# Patient Record
Sex: Female | Born: 1965 | Hispanic: No | Marital: Married | Smoking: Never smoker
Health system: Southern US, Community
[De-identification: ages and names within clinical notes are randomized; demographics above are authoritative.]

## PROBLEM LIST (undated history)

## (undated) DIAGNOSIS — Z923 Personal history of irradiation: Secondary | ICD-10-CM

## (undated) DIAGNOSIS — I10 Essential (primary) hypertension: Secondary | ICD-10-CM

## (undated) DIAGNOSIS — C50919 Malignant neoplasm of unspecified site of unspecified female breast: Secondary | ICD-10-CM

## (undated) DIAGNOSIS — K219 Gastro-esophageal reflux disease without esophagitis: Secondary | ICD-10-CM

## (undated) DIAGNOSIS — F419 Anxiety disorder, unspecified: Secondary | ICD-10-CM

## (undated) HISTORY — DX: Malignant neoplasm of unspecified site of unspecified female breast: C50.919

## (undated) HISTORY — PX: CHOLECYSTECTOMY: SHX55

---

## 2007-05-23 ENCOUNTER — Encounter: Admission: RE | Admit: 2007-05-23 | Discharge: 2007-05-23 | Payer: Self-pay | Admitting: General Surgery

## 2007-05-27 ENCOUNTER — Ambulatory Visit (HOSPITAL_COMMUNITY): Admission: RE | Admit: 2007-05-27 | Discharge: 2007-05-27 | Payer: Self-pay | Admitting: Internal Medicine

## 2007-08-05 ENCOUNTER — Encounter: Admission: RE | Admit: 2007-08-05 | Discharge: 2007-08-05 | Payer: Self-pay | Admitting: Family Medicine

## 2007-09-23 ENCOUNTER — Encounter: Admission: RE | Admit: 2007-09-23 | Discharge: 2007-09-23 | Payer: Self-pay | Admitting: Internal Medicine

## 2007-09-23 ENCOUNTER — Encounter (INDEPENDENT_AMBULATORY_CARE_PROVIDER_SITE_OTHER): Payer: Self-pay | Admitting: Diagnostic Radiology

## 2007-09-28 HISTORY — PX: MASTECTOMY: SHX3

## 2007-12-01 ENCOUNTER — Ambulatory Visit: Admission: RE | Admit: 2007-12-01 | Discharge: 2008-02-14 | Payer: Self-pay | Admitting: Radiation Oncology

## 2008-05-17 ENCOUNTER — Encounter: Admission: RE | Admit: 2008-05-17 | Discharge: 2008-05-17 | Payer: Self-pay | Admitting: Internal Medicine

## 2008-06-26 ENCOUNTER — Encounter: Admission: RE | Admit: 2008-06-26 | Discharge: 2008-06-26 | Payer: Self-pay | Admitting: Internal Medicine

## 2009-06-26 ENCOUNTER — Encounter: Admission: RE | Admit: 2009-06-26 | Discharge: 2009-06-26 | Payer: Self-pay | Admitting: Internal Medicine

## 2010-06-27 ENCOUNTER — Encounter
Admission: RE | Admit: 2010-06-27 | Discharge: 2010-06-27 | Payer: Self-pay | Source: Home / Self Care | Attending: Internal Medicine | Admitting: Internal Medicine

## 2010-07-20 ENCOUNTER — Encounter: Payer: Self-pay | Admitting: General Surgery

## 2010-07-21 ENCOUNTER — Encounter
Admission: RE | Admit: 2010-07-21 | Discharge: 2010-07-21 | Payer: Self-pay | Source: Home / Self Care | Attending: Internal Medicine | Admitting: Internal Medicine

## 2011-02-24 ENCOUNTER — Other Ambulatory Visit (HOSPITAL_COMMUNITY): Payer: Self-pay | Admitting: Internal Medicine

## 2011-02-24 DIAGNOSIS — Z1231 Encounter for screening mammogram for malignant neoplasm of breast: Secondary | ICD-10-CM

## 2011-02-24 DIAGNOSIS — Z9012 Acquired absence of left breast and nipple: Secondary | ICD-10-CM

## 2011-07-02 ENCOUNTER — Ambulatory Visit
Admission: RE | Admit: 2011-07-02 | Discharge: 2011-07-02 | Disposition: A | Discharge status: Home / Self Care | Payer: BC Managed Care – PPO | Source: Ambulatory Visit | Attending: Internal Medicine | Admitting: Internal Medicine

## 2011-07-02 DIAGNOSIS — Z9012 Acquired absence of left breast and nipple: Secondary | ICD-10-CM

## 2011-07-02 DIAGNOSIS — Z1231 Encounter for screening mammogram for malignant neoplasm of breast: Secondary | ICD-10-CM

## 2012-02-11 ENCOUNTER — Encounter: Payer: BC Managed Care – PPO | Admitting: Internal Medicine

## 2012-02-26 ENCOUNTER — Encounter: Payer: BC Managed Care – PPO | Admitting: Internal Medicine

## 2012-02-26 DIAGNOSIS — Z170 Estrogen receptor positive status [ER+]: Secondary | ICD-10-CM

## 2012-02-26 DIAGNOSIS — C50919 Malignant neoplasm of unspecified site of unspecified female breast: Secondary | ICD-10-CM

## 2012-06-17 ENCOUNTER — Other Ambulatory Visit (HOSPITAL_COMMUNITY): Payer: Self-pay | Admitting: Internal Medicine

## 2012-06-17 DIAGNOSIS — Z1231 Encounter for screening mammogram for malignant neoplasm of breast: Secondary | ICD-10-CM

## 2012-06-17 DIAGNOSIS — Z9012 Acquired absence of left breast and nipple: Secondary | ICD-10-CM

## 2012-06-17 DIAGNOSIS — Z853 Personal history of malignant neoplasm of breast: Secondary | ICD-10-CM

## 2012-07-20 ENCOUNTER — Ambulatory Visit
Admission: RE | Admit: 2012-07-20 | Discharge: 2012-07-20 | Disposition: A | Discharge status: Home / Self Care | Payer: BC Managed Care – PPO | Source: Ambulatory Visit | Attending: Internal Medicine | Admitting: Internal Medicine

## 2012-07-20 DIAGNOSIS — Z9012 Acquired absence of left breast and nipple: Secondary | ICD-10-CM

## 2012-07-20 DIAGNOSIS — Z853 Personal history of malignant neoplasm of breast: Secondary | ICD-10-CM

## 2012-07-20 DIAGNOSIS — Z1231 Encounter for screening mammogram for malignant neoplasm of breast: Secondary | ICD-10-CM

## 2012-07-29 ENCOUNTER — Other Ambulatory Visit (HOSPITAL_COMMUNITY): Payer: Self-pay | Admitting: Internal Medicine

## 2012-07-29 DIAGNOSIS — Z853 Personal history of malignant neoplasm of breast: Secondary | ICD-10-CM

## 2012-08-16 ENCOUNTER — Ambulatory Visit
Admission: RE | Admit: 2012-08-16 | Discharge: 2012-08-16 | Disposition: A | Discharge status: Home / Self Care | Payer: BC Managed Care – PPO | Source: Ambulatory Visit | Attending: Internal Medicine | Admitting: Internal Medicine

## 2012-08-16 DIAGNOSIS — Z853 Personal history of malignant neoplasm of breast: Secondary | ICD-10-CM

## 2012-08-16 MED ORDER — GADOBENATE DIMEGLUMINE 529 MG/ML IV SOLN
14.0000 mL | Freq: Once | INTRAVENOUS | Status: AC | PRN
  Administered 2012-08-16: 14 mL via INTRAVENOUS

## 2012-12-22 ENCOUNTER — Encounter: Payer: BC Managed Care – PPO | Admitting: Internal Medicine

## 2013-07-05 ENCOUNTER — Other Ambulatory Visit: Payer: Self-pay

## 2013-07-05 DIAGNOSIS — Z1231 Encounter for screening mammogram for malignant neoplasm of breast: Secondary | ICD-10-CM

## 2013-07-05 DIAGNOSIS — Z853 Personal history of malignant neoplasm of breast: Secondary | ICD-10-CM

## 2013-07-05 DIAGNOSIS — Z9012 Acquired absence of left breast and nipple: Secondary | ICD-10-CM

## 2013-07-20 ENCOUNTER — Other Ambulatory Visit: Payer: Self-pay

## 2013-07-20 DIAGNOSIS — Z9012 Acquired absence of left breast and nipple: Secondary | ICD-10-CM

## 2013-07-20 DIAGNOSIS — Z1231 Encounter for screening mammogram for malignant neoplasm of breast: Secondary | ICD-10-CM

## 2013-07-20 DIAGNOSIS — Z853 Personal history of malignant neoplasm of breast: Secondary | ICD-10-CM

## 2013-07-28 ENCOUNTER — Ambulatory Visit
Admission: RE | Admit: 2013-07-28 | Discharge: 2013-07-28 | Disposition: A | Discharge status: Home / Self Care | Payer: BC Managed Care – PPO | Source: Ambulatory Visit

## 2013-07-28 DIAGNOSIS — Z1231 Encounter for screening mammogram for malignant neoplasm of breast: Secondary | ICD-10-CM

## 2013-07-28 DIAGNOSIS — Z9012 Acquired absence of left breast and nipple: Secondary | ICD-10-CM

## 2013-07-28 DIAGNOSIS — Z853 Personal history of malignant neoplasm of breast: Secondary | ICD-10-CM

## 2013-09-18 ENCOUNTER — Other Ambulatory Visit (HOSPITAL_COMMUNITY): Payer: Self-pay | Admitting: Internal Medicine

## 2013-09-18 DIAGNOSIS — C50919 Malignant neoplasm of unspecified site of unspecified female breast: Secondary | ICD-10-CM

## 2013-09-18 DIAGNOSIS — Z1231 Encounter for screening mammogram for malignant neoplasm of breast: Secondary | ICD-10-CM

## 2014-07-24 ENCOUNTER — Other Ambulatory Visit: Payer: BC Managed Care – PPO

## 2014-07-30 ENCOUNTER — Ambulatory Visit: Payer: Self-pay

## 2014-07-30 ENCOUNTER — Ambulatory Visit: Payer: BC Managed Care – PPO

## 2014-07-31 ENCOUNTER — Ambulatory Visit
Admission: RE | Admit: 2014-07-31 | Discharge: 2014-07-31 | Disposition: A | Discharge status: Home / Self Care | Payer: BLUE CROSS/BLUE SHIELD | Source: Ambulatory Visit | Attending: Internal Medicine | Admitting: Internal Medicine

## 2014-07-31 DIAGNOSIS — Z1231 Encounter for screening mammogram for malignant neoplasm of breast: Secondary | ICD-10-CM

## 2014-08-01 ENCOUNTER — Other Ambulatory Visit: Payer: BC Managed Care – PPO

## 2014-09-17 ENCOUNTER — Other Ambulatory Visit (HOSPITAL_COMMUNITY): Payer: Self-pay | Admitting: Internal Medicine

## 2014-09-17 DIAGNOSIS — C50112 Malignant neoplasm of central portion of left female breast: Secondary | ICD-10-CM

## 2014-09-17 DIAGNOSIS — M898X9 Other specified disorders of bone, unspecified site: Secondary | ICD-10-CM

## 2014-09-20 ENCOUNTER — Other Ambulatory Visit (HOSPITAL_COMMUNITY): Payer: Self-pay | Admitting: Internal Medicine

## 2014-09-20 ENCOUNTER — Encounter (HOSPITAL_COMMUNITY): Payer: BLUE CROSS/BLUE SHIELD

## 2014-09-20 ENCOUNTER — Encounter (HOSPITAL_COMMUNITY)
Admission: RE | Admit: 2014-09-20 | Discharge: 2014-09-20 | Disposition: A | Discharge status: Home / Self Care | Payer: BLUE CROSS/BLUE SHIELD | Source: Ambulatory Visit | Attending: Internal Medicine | Admitting: Internal Medicine

## 2014-09-20 DIAGNOSIS — C50112 Malignant neoplasm of central portion of left female breast: Secondary | ICD-10-CM

## 2014-09-20 DIAGNOSIS — R079 Chest pain, unspecified: Secondary | ICD-10-CM | POA: Diagnosis not present

## 2014-09-20 DIAGNOSIS — M549 Dorsalgia, unspecified: Secondary | ICD-10-CM | POA: Diagnosis not present

## 2014-09-20 DIAGNOSIS — C50919 Malignant neoplasm of unspecified site of unspecified female breast: Secondary | ICD-10-CM

## 2014-09-20 DIAGNOSIS — M898X9 Other specified disorders of bone, unspecified site: Secondary | ICD-10-CM

## 2014-09-20 MED ORDER — TECHNETIUM TC 99M MEDRONATE IV KIT
25.0000 | PACK | Freq: Once | INTRAVENOUS | Status: AC | PRN
  Administered 2014-09-20: 25 via INTRAVENOUS

## 2014-09-25 ENCOUNTER — Other Ambulatory Visit (HOSPITAL_COMMUNITY): Payer: Self-pay | Admitting: Internal Medicine

## 2014-09-25 ENCOUNTER — Ambulatory Visit (HOSPITAL_COMMUNITY)
Admission: RE | Admit: 2014-09-25 | Discharge: 2014-09-25 | Disposition: A | Discharge status: Home / Self Care | Payer: BLUE CROSS/BLUE SHIELD | Source: Ambulatory Visit | Attending: Internal Medicine | Admitting: Internal Medicine

## 2014-09-25 DIAGNOSIS — C7802 Secondary malignant neoplasm of left lung: Secondary | ICD-10-CM | POA: Diagnosis not present

## 2014-09-25 DIAGNOSIS — C7951 Secondary malignant neoplasm of bone: Secondary | ICD-10-CM | POA: Insufficient documentation

## 2014-09-25 DIAGNOSIS — G893 Neoplasm related pain (acute) (chronic): Secondary | ICD-10-CM

## 2014-09-25 DIAGNOSIS — C50919 Malignant neoplasm of unspecified site of unspecified female breast: Secondary | ICD-10-CM | POA: Diagnosis present

## 2014-09-25 DIAGNOSIS — C771 Secondary and unspecified malignant neoplasm of intrathoracic lymph nodes: Secondary | ICD-10-CM | POA: Insufficient documentation

## 2014-09-25 DIAGNOSIS — C7801 Secondary malignant neoplasm of right lung: Secondary | ICD-10-CM | POA: Diagnosis not present

## 2014-09-25 DIAGNOSIS — C419 Malignant neoplasm of bone and articular cartilage, unspecified: Secondary | ICD-10-CM

## 2014-09-25 DIAGNOSIS — M5440 Lumbago with sciatica, unspecified side: Secondary | ICD-10-CM

## 2014-09-25 LAB — GLUCOSE, CAPILLARY: Glucose-Capillary: 93 mg/dL (ref 70–99)

## 2014-09-25 MED ORDER — FLUDEOXYGLUCOSE F - 18 (FDG) INJECTION
7.8900 | Freq: Once | INTRAVENOUS | Status: AC | PRN
  Administered 2014-09-25: 7.89 via INTRAVENOUS

## 2014-09-26 ENCOUNTER — Other Ambulatory Visit (HOSPITAL_COMMUNITY): Payer: Self-pay | Admitting: Internal Medicine

## 2014-09-26 DIAGNOSIS — C50912 Malignant neoplasm of unspecified site of left female breast: Secondary | ICD-10-CM

## 2014-09-27 ENCOUNTER — Other Ambulatory Visit: Payer: Self-pay | Admitting: Radiology

## 2014-10-01 ENCOUNTER — Ambulatory Visit (HOSPITAL_COMMUNITY)
Admission: RE | Admit: 2014-10-01 | Discharge: 2014-10-01 | Disposition: A | Discharge status: Home / Self Care | Payer: BLUE CROSS/BLUE SHIELD | Source: Ambulatory Visit | Attending: Internal Medicine | Admitting: Internal Medicine

## 2014-10-01 ENCOUNTER — Encounter (HOSPITAL_COMMUNITY): Payer: Self-pay

## 2014-10-01 DIAGNOSIS — C50912 Malignant neoplasm of unspecified site of left female breast: Secondary | ICD-10-CM

## 2014-10-01 DIAGNOSIS — M899 Disorder of bone, unspecified: Secondary | ICD-10-CM | POA: Diagnosis present

## 2014-10-01 HISTORY — DX: Anxiety disorder, unspecified: F41.9

## 2014-10-01 LAB — CBC WITH DIFFERENTIAL/PLATELET
BASOS ABS: 0 10*3/uL (ref 0.0–0.1)
Basophils Relative: 0 % (ref 0–1)
EOS PCT: 3 % (ref 0–5)
Eosinophils Absolute: 0.2 10*3/uL (ref 0.0–0.7)
HEMATOCRIT: 43.2 % (ref 36.0–46.0)
HEMOGLOBIN: 14.2 g/dL (ref 12.0–15.0)
Lymphocytes Relative: 29 % (ref 12–46)
Lymphs Abs: 2.6 10*3/uL (ref 0.7–4.0)
MCH: 30.7 pg (ref 26.0–34.0)
MCHC: 32.9 g/dL (ref 30.0–36.0)
MCV: 93.3 fL (ref 78.0–100.0)
Monocytes Absolute: 0.8 10*3/uL (ref 0.1–1.0)
Monocytes Relative: 8 % (ref 3–12)
NEUTROS ABS: 5.4 10*3/uL (ref 1.7–7.7)
Neutrophils Relative %: 60 % (ref 43–77)
Platelets: 239 10*3/uL (ref 150–400)
RBC: 4.63 MIL/uL (ref 3.87–5.11)
RDW: 12.9 % (ref 11.5–15.5)
WBC: 9 10*3/uL (ref 4.0–10.5)

## 2014-10-01 LAB — APTT: aPTT: 28 seconds (ref 24–37)

## 2014-10-01 LAB — PROTIME-INR
INR: 0.93 (ref 0.00–1.49)
Prothrombin Time: 12.5 seconds (ref 11.6–15.2)

## 2014-10-01 MED ORDER — MIDAZOLAM HCL 2 MG/2ML IJ SOLN
INTRAMUSCULAR | Status: AC | PRN
  Administered 2014-10-01 (×3): 1 mg via INTRAVENOUS

## 2014-10-01 MED ORDER — FENTANYL CITRATE 0.05 MG/ML IJ SOLN
INTRAMUSCULAR | Status: AC | PRN
Start: 2014-10-01 — End: 2014-10-01
  Administered 2014-10-01: 25 ug via INTRAVENOUS
  Administered 2014-10-01: 50 ug via INTRAVENOUS

## 2014-10-01 MED ORDER — SODIUM CHLORIDE 0.9 % IV SOLN
INTRAVENOUS | Status: DC
  Administered 2014-10-01: 07:00:00 via INTRAVENOUS

## 2014-10-01 MED ORDER — FENTANYL CITRATE 0.05 MG/ML IJ SOLN
INTRAMUSCULAR | Status: AC
  Filled 2014-10-01: qty 4

## 2014-10-01 MED ORDER — MIDAZOLAM HCL 2 MG/2ML IJ SOLN
INTRAMUSCULAR | Status: AC
  Filled 2014-10-01: qty 6

## 2014-10-01 NOTE — Discharge Instructions (Signed)
Leave dressing on for 24 hours.  You may shower after 24 hours.  Please remove the dressing before you shower.   ° °Conscious Sedation, Adult, Care After °Refer to this sheet in the next few weeks. These instructions provide you with information on caring for yourself after your procedure. Your health care provider may also give you more specific instructions. Your treatment has been planned according to current medical practices, but problems sometimes occur. Call your health care provider if you have any problems or questions after your procedure. °WHAT TO EXPECT AFTER THE PROCEDURE  °After your procedure: °· You may feel sleepy, clumsy, and have poor balance for several hours. °· Vomiting may occur if you eat too soon after the procedure. °HOME CARE INSTRUCTIONS °· Do not participate in any activities where you could become injured for at least 24 hours. Do not: °¨ Drive. °¨ Swim. °¨ Ride a bicycle. °¨ Operate heavy machinery. °¨ Cook. °¨ Use power tools. °¨ Climb ladders. °¨ Work from a high place. °· Do not make important decisions or sign legal documents until you are improved. °· If you vomit, drink water, juice, or soup when you can drink without vomiting. Make sure you have little or no nausea before eating solid foods. °· Only take over-the-counter or prescription medicines for pain, discomfort, or fever as directed by your health care provider. °· Make sure you and your family fully understand everything about the medicines given to you, including what side effects may occur. °· You should not drink alcohol, take sleeping pills, or take medicines that cause drowsiness for at least 24 hours. °· If you smoke, do not smoke without supervision. °· If you are feeling better, you may resume normal activities 24 hours after you were sedated. °· Keep all appointments with your health care provider. °SEEK MEDICAL CARE IF: °· Your skin is pale or bluish in color. °· You continue to feel nauseous or vomit. °· Your  pain is getting worse and is not helped by medicine. °· You have bleeding or swelling. °· You are still sleepy or feeling clumsy after 24 hours. °SEEK IMMEDIATE MEDICAL CARE IF: °· You develop a rash. °· You have difficulty breathing. °· You develop any type of allergic problem. °· You have a fever. °MAKE SURE YOU: °· Understand these instructions. °· Will watch your condition. °· Will get help right away if you are not doing well or get worse. °Document Released: 04/05/2013 Document Reviewed: 04/05/2013 °[employer]® Patient Information ©2015 [employer]. This information is not intended to replace advice given to you by your health care provider. Make sure you discuss any questions you have with your health care provider. °Bone Marrow Aspiration, Bone Marrow Biopsy °Care After °Read the instructions outlined below and refer to this sheet in the next few weeks. These discharge instructions provide you with general information on caring for yourself after you leave the hospital. Your caregiver may also give you specific instructions. While your treatment has been planned according to the most current medical practices available, unavoidable complications occasionally occur. If you have any problems or questions after discharge, call your caregiver. °FINDING OUT THE RESULTS OF YOUR TEST °Not all test results are available during your visit. If your test results are not back during the visit, make an appointment with your caregiver to find out the results. Do not assume everything is normal if you have not heard from your caregiver or the medical facility. It is important for you to follow up   on all of your test results.  °HOME CARE INSTRUCTIONS  °You have had sedation and may be sleepy or dizzy. Your thinking may not be as clear as usual. For the next 24 hours: °· Only take over-the-counter or prescription medicines for pain, discomfort, and or fever as directed by your caregiver. °· Do not drink alcohol. °· Do not  smoke. °· Do not drive. °· Do not make important legal decisions. °· Do not operate heavy machinery. °· Do not care for small children by yourself. °· Keep your dressing clean and dry. You may replace dressing with a bandage after 24 hours. °· You may take a bath or shower after 24 hours. °· Use an ice pack for 20 minutes every 2 hours while awake for pain as needed. °SEEK MEDICAL CARE IF:  °· There is redness, swelling, or increasing pain at the biopsy site. °· There is pus coming from the biopsy site. °· There is drainage from a biopsy site lasting longer than one day. °· An unexplained oral temperature above 102° F (38.9° C) develops. °SEEK IMMEDIATE MEDICAL CARE IF:  °· You develop a rash. °· You have difficulty breathing. °· You develop any reaction or side effects to medications given. °Document Released: 01/02/2005 Document Revised: 09/07/2011 Document Reviewed: 06/12/2008 °[employer]® Patient Information ©2015 [employer]. This information is not intended to replace advice given to you by your health care provider. Make sure you discuss any questions you have with your health care provider. ° ° °

## 2014-10-01 NOTE — Procedures (Signed)
L iliac bone Bx 18 g core times four No comp

## 2014-10-01 NOTE — H&P (Signed)
Chief Complaint: "I'm having a biopsy"  Referring Physician(s): Darovsky,Boris M  History of Present Illness: Jill Johnston is a 49 y.o. female with history of left breast carcinoma and recent PET scan revealing extensive metastatic disease, including bones, thoracic nodes and lungs. She presents today for CT guided left iliac bone lesion biopsy.   Past Medical History  Diagnosis Date  . Anxiety   . Cancer     breast cancer    Past Surgical History  Procedure Laterality Date  . Mastectomy  april 2009    left breast, with lymph nodes removed also  . Cholecystectomy      Allergies: Review of patient's allergies indicates no known allergies.  Medications: Prior to Admission medications   Medication Sig Start Date End Date Taking? Authorizing Provider  ALPRAZolam (XANAX) 0.25 MG tablet Take 0.5 mg by mouth 3 (three) times daily as needed for anxiety or sleep.   Yes Historical Provider, MD  Calcium Carbonate-Vitamin D (CALCIUM + D PO) Take 1 tablet by mouth daily.   Yes Historical Provider, MD  HYDROcodone-acetaminophen (NORCO/VICODIN) 5-325 MG per tablet Take 1 tablet by mouth every 6 (six) hours as needed for moderate pain.   Yes Historical Provider, MD  letrozole (FEMARA) 2.5 MG tablet Take 2.5 mg by mouth daily.   Yes Historical Provider, MD  lisinopril (PRINIVIL,ZESTRIL) 10 MG tablet Take 10 mg by mouth every morning.   Yes Historical Provider, MD    History reviewed. No pertinent family history.  History   Social History  . Marital Status: Married    Spouse Name: N/A  . Number of Children: N/A  . Years of Education: N/A   Social History Main Topics  . Smoking status: Never Smoker   . Smokeless tobacco: Not on file  . Alcohol Use: No  . Drug Use: No  . Sexual Activity: Not on file   Other Topics Concern  . None   Social History Narrative      Review of Systems  Constitutional: Negative for fever and chills.  Respiratory: Negative for shortness  of breath.        Occ cough  Cardiovascular: Negative for chest pain.  Gastrointestinal: Negative for nausea, vomiting, abdominal pain and blood in stool.  Genitourinary: Negative for dysuria and hematuria.  Musculoskeletal: Positive for back pain.  Neurological: Positive for headaches.  Hematological: Does not bruise/bleed easily.  Psychiatric/Behavioral: The patient is nervous/anxious.     Vital Signs: BP 122/91 mmHg  Pulse 97  Temp(Src) 98.1 F (36.7 C) (Oral)  Resp 18  Ht _0  (1.651 m)  Wt 158 lb (71.668 kg)  BMI 26.29 kg/m2  SpO2 96%  Physical Exam  Constitutional: She is oriented to person, place, and time. She appears well-developed and well-nourished.  Cardiovascular: Normal rate and regular rhythm.   Pulmonary/Chest: Effort normal and breath sounds normal.  Abdominal: Soft. Bowel sounds are normal. There is no tenderness.  Musculoskeletal: Normal range of motion. She exhibits no edema.  Neurological: She is alert and oriented to person, place, and time.    Imaging: Nm Bone Scan Whole Body  09/20/2014   CLINICAL DATA:  LEFT breast cancer, back and LEFT chest pain  EXAM: NUCLEAR MEDICINE WHOLE BODY BONE SCAN  TECHNIQUE: Whole body anterior and posterior images were obtained approximately 3 hours after intravenous injection of radiopharmaceutical.  RADIOPHARMACEUTICALS:  25 mCi Technetium-79mMDP IV  COMPARISON:  None  Radiographic correlation:  Chest radiograph 08/08/2014  FINDINGS: Multiple abnormal foci of increased  osseous tracer accumulation identified.  These include BILATERAL pelvis, BILATERAL anterior and posterior ribs, calvarium, distal LEFT femoral diaphysis medially, and suspect humeral diaphyses bilaterally and T12 vertebra.  This pattern of uptake is most consistent with osseous metastatic disease.  Expected urinary tract and soft tissue distribution of tracer.  IMPRESSION: Multiple foci of abnormal osseous tracer localization as above consistent with osseous  metastatic disease.  Of note are small foci subtle increased uptake at the medial aspect of the distal LEFT femoral diaphysis and suspect at the humeral diaphyses bilaterally.  Findings discussed with Dr. Jacquiline Doe on 09/20/2014 at 1601 hours.   Electronically Signed   By: Lavonia Dana M.D.   On: 09/20/2014 16:04   Nm Pet Image Restag (ps) Skull Base To Thigh  09/25/2014   CLINICAL DATA:  Subsequent treatment strategy for restaging of breast cancer.  EXAM: NUCLEAR MEDICINE PET SKULL BASE TO THIGH  TECHNIQUE: 7.9 mCi F-18 FDG was injected intravenously. Full-ring PET imaging was performed from the skull base to thigh after the radiotracer. CT data was obtained and used for attenuation correction and anatomic localization.  FASTING BLOOD GLUCOSE:  Value: 93 mg/dl  COMPARISON:  09/20/2014 bone scan.  05/27/2007 PET.  FINDINGS: NECK  Left palatine tonsil hypermetabolism is favored to be physiologic. No cervical nodal hypermetabolism.  CHEST  Mediastinal and bilateral hilar nodal metastasis. A subcarinal node measures 1.0 cm and a S.U.V. max of 9.1 on image 69. Bilateral hypermetabolic pulmonary nodules. Index posterior right upper lobe nodule measures 9 mm and a S.U.V. max of 6.2 on image 28 of series 6.  A focus of hypermetabolism about the right side of the esophagus corresponds to subtle soft tissue fullness. This measures 8 mm and a S.U.V. max of 5.5 on image 87.  ABDOMEN/PELVIS  No areas of abnormal hypermetabolism.  SKELETON  Extensive osseous metastasis, including throughout the spine, ribs, and pelvis. An index posterior left iliac lesion measures 3.9 cm and a S.U.V. max of 11.2  CT IMAGES PERFORMED FOR ATTENUATION CORRECTION  No cervical adenopathy.  Left mastectomy. Left axillary node dissection. No pericardial or pleural effusion. Moderate hepatic steatosis. Probable hepatic cysts with further hypoattenuation at the hepatic dome on image 82. Not FDG avid.  An area of relative hyperattenuation in the right  lobe of the liver on image 94 of series 4 is without PET correlate. Similar findings are identified more anteriorly in the right lobe on image 107.  IMPRESSION: 1. Extensive metastatic disease, including within the bones, thoracic nodes, and lungs. 2. Hypermetabolism within the distal esophagus, corresponding to subtle soft tissue fullness. Considerations include an atypical appearance of metastatic disease versus a synchronous esophageal lesion. Physiologic or inflammatory hypermetabolism is felt less likely, given concurrent soft tissue fullness. Consider endoscopy. 3. Hepatic steatosis. Areas of relative hyperattenuation are indeterminate. Given absence of significant hypermetabolism, metastasis not favored. Possibly areas of fat sparing. These could either be re-evaluated at followup or more entirely characterized with pre and post contrast abdominal MRI (ideally with Eovist).   Electronically Signed   By: Abigail Miyamoto M.D.   On: 09/25/2014 09:04    Labs:  CBC:  Recent Labs  10/01/14 0730  WBC 9.0  HGB 14.2  HCT 43.2  PLT 239    COAGS:  Recent Labs  10/01/14 0730  INR 0.93  APTT 28    BMP: No results for input(s): NA, K, CL, CO2, GLUCOSE, BUN, CALCIUM, CREATININE, GFRNONAA, GFRAA in the last 8760 hours.  Invalid input(s): CMP  LIVER FUNCTION TESTS: No results for input(s): BILITOT, AST, ALT, ALKPHOS, PROT, ALBUMIN in the last 8760 hours.  TUMOR MARKERS: No results for input(s): AFPTM, CEA, CA199, CHROMGRNA in the last 8760 hours.  Assessment and Plan: Jill Johnston is a 49 y.o. female with history of left breast carcinoma and recent PET scan revealing extensive metastatic disease, including bones, thoracic nodes and lungs. She presents today for CT guided left iliac bone lesion biopsy.Risks and benefits discussed with the patient/husband including, but not limited to bleeding, infection, damage to adjacent structures or low yield requiring additional tests. All of the  patient's questions were answered, patient is agreeable to proceed. Consent signed and in chart.     Signed: Autumn Messing 10/01/2014, 8:41 AM   I spent a total of 20 minutes face to face in clinical consultation, greater than 50% of which was counseling/coordinating care for CT guided bone marrow biopsy

## 2014-10-03 ENCOUNTER — Ambulatory Visit (HOSPITAL_COMMUNITY)
Admission: RE | Admit: 2014-10-03 | Discharge: 2014-10-03 | Disposition: A | Discharge status: Home / Self Care | Payer: BLUE CROSS/BLUE SHIELD | Source: Ambulatory Visit | Attending: Internal Medicine | Admitting: Internal Medicine

## 2014-10-03 DIAGNOSIS — C7951 Secondary malignant neoplasm of bone: Secondary | ICD-10-CM | POA: Insufficient documentation

## 2014-10-03 DIAGNOSIS — M5440 Lumbago with sciatica, unspecified side: Secondary | ICD-10-CM

## 2014-10-03 DIAGNOSIS — C50919 Malignant neoplasm of unspecified site of unspecified female breast: Secondary | ICD-10-CM

## 2014-10-03 DIAGNOSIS — C419 Malignant neoplasm of bone and articular cartilage, unspecified: Secondary | ICD-10-CM

## 2014-10-03 DIAGNOSIS — G893 Neoplasm related pain (acute) (chronic): Secondary | ICD-10-CM

## 2014-10-03 DIAGNOSIS — C50912 Malignant neoplasm of unspecified site of left female breast: Secondary | ICD-10-CM | POA: Insufficient documentation

## 2014-10-03 DIAGNOSIS — M8458XA Pathological fracture in neoplastic disease, other specified site, initial encounter for fracture: Secondary | ICD-10-CM | POA: Insufficient documentation

## 2014-10-04 ENCOUNTER — Ambulatory Visit (HOSPITAL_COMMUNITY)
Admission: RE | Admit: 2014-10-04 | Discharge: 2014-10-04 | Disposition: A | Discharge status: Home / Self Care | Payer: BLUE CROSS/BLUE SHIELD | Source: Ambulatory Visit | Attending: Internal Medicine | Admitting: Internal Medicine

## 2014-10-04 DIAGNOSIS — M79605 Pain in left leg: Secondary | ICD-10-CM | POA: Insufficient documentation

## 2014-10-04 DIAGNOSIS — G893 Neoplasm related pain (acute) (chronic): Secondary | ICD-10-CM

## 2014-10-04 DIAGNOSIS — C50919 Malignant neoplasm of unspecified site of unspecified female breast: Secondary | ICD-10-CM | POA: Diagnosis not present

## 2014-10-04 DIAGNOSIS — C419 Malignant neoplasm of bone and articular cartilage, unspecified: Secondary | ICD-10-CM

## 2014-10-04 DIAGNOSIS — M5440 Lumbago with sciatica, unspecified side: Secondary | ICD-10-CM

## 2014-12-14 ENCOUNTER — Other Ambulatory Visit (HOSPITAL_COMMUNITY): Payer: Self-pay | Admitting: Internal Medicine

## 2014-12-14 DIAGNOSIS — C50912 Malignant neoplasm of unspecified site of left female breast: Secondary | ICD-10-CM

## 2014-12-21 ENCOUNTER — Ambulatory Visit (HOSPITAL_COMMUNITY): Admission: RE | Admit: 2014-12-21 | Payer: BLUE CROSS/BLUE SHIELD | Source: Ambulatory Visit

## 2014-12-26 ENCOUNTER — Ambulatory Visit: Payer: BLUE CROSS/BLUE SHIELD | Admitting: Radiation Oncology

## 2014-12-26 ENCOUNTER — Ambulatory Visit: Payer: BLUE CROSS/BLUE SHIELD

## 2014-12-28 ENCOUNTER — Ambulatory Visit (HOSPITAL_COMMUNITY)
Admission: RE | Admit: 2014-12-28 | Discharge: 2014-12-28 | Disposition: A | Discharge status: Home / Self Care | Payer: BLUE CROSS/BLUE SHIELD | Source: Ambulatory Visit | Attending: Internal Medicine | Admitting: Internal Medicine

## 2014-12-28 DIAGNOSIS — M79605 Pain in left leg: Secondary | ICD-10-CM | POA: Diagnosis not present

## 2014-12-28 DIAGNOSIS — C50919 Malignant neoplasm of unspecified site of unspecified female breast: Secondary | ICD-10-CM | POA: Diagnosis present

## 2014-12-28 DIAGNOSIS — R102 Pelvic and perineal pain: Secondary | ICD-10-CM | POA: Diagnosis not present

## 2015-01-02 ENCOUNTER — Encounter: Payer: Self-pay | Admitting: Radiation Oncology

## 2015-01-02 NOTE — Progress Notes (Signed)
Histology and Location of Primary Cancer: bilateral breast ca ER, PR positive, HER-2 negative  Sites of Visceral and Bony Metastatic Disease: left and right femoral neck metastasis  Location(s) of Symptomatic Metastases: left and right femoral neck metastasis  Past/Anticipated chemotherapy by medical oncology, if any: Current regimen: Letrozol 2.5 ng oi okys Oakoc=icuckub 125 mg po daily (21 days of 28 day cycle  Pain on a scale of 0-10 is: admits ver mild low back pain, bilateral knee pain but she is able to drive and perform ADLs. Taking 30 mg of OxyContin every 12 hours without need for breakthrough pain medication   If Spine Met(s), symptoms, if any, include:  Bowel/Bladder retention or incontinence (please describe): intermittent constipation  Numbness or weakness in extremities (please describe): NO  Current Decadron regimen, if applicable:   Ambulatory status? Walker? Wheelchair?: Ambulatory  SAFETY ISSUES:  Prior radiation? yes  Pacemaker/ICD? no  Possible current pregnancy? no  Is the patient on methotrexate? no  Current Complaints / other details:  50 year old female. Married. Treated by Dr. [name] in 2009 at [place] location. Lives in [place]. Was a Biochemist, clinical. S/p mastectomy and radiation therapy. Paternal grandmother had colon ca.

## 2015-01-03 ENCOUNTER — Ambulatory Visit
Admission: RE | Admit: 2015-01-03 | Discharge: 2015-01-03 | Disposition: A | Discharge status: Home / Self Care | Payer: BLUE CROSS/BLUE SHIELD | Source: Ambulatory Visit | Attending: Radiation Oncology | Admitting: Radiation Oncology

## 2015-01-03 ENCOUNTER — Encounter: Payer: Self-pay | Admitting: Radiation Oncology

## 2015-01-03 VITALS — BP 118/88 | HR 92 | Resp 16 | Ht 65.0 in | Wt 153.7 lb

## 2015-01-03 DIAGNOSIS — C7951 Secondary malignant neoplasm of bone: Secondary | ICD-10-CM

## 2015-01-03 DIAGNOSIS — C50912 Malignant neoplasm of unspecified site of left female breast: Secondary | ICD-10-CM | POA: Insufficient documentation

## 2015-01-03 NOTE — Progress Notes (Signed)
See progress note under physician encounter. 

## 2015-01-03 NOTE — Progress Notes (Signed)
Patient denies taking Femara. Denies headache, dizziness, nausea, or vomiting. Reports bilateral femur pain 6 on a scale of 0-10. Denies pain when sitting. Reports pain is worse when she is fatigue and the longer she ambulates. Reports taking oxycontin 30 mg bid to manage pain. Reports taking miralax to manage constipation. Denies numbness or tingling of extremities. No edema noted in either foot. Denies hot flashes or unintentional weight loss.

## 2015-01-03 NOTE — Progress Notes (Signed)
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    ICD-9-CM ICD-10-CM   1. Malignant neoplasm of left breast 174.9 C50.912   2. Bone metastasis 198.5 C79.51      HISTORY OF PRESENT ILLNESS:Jill Johnston is a 49 y.o. female who is presenting to clinic in regards to her painful bone metastasis from breast cancer. She was initially diagnosed with T2N2 left breast cancer in 2008. She had chemotherapy and post mastectomy radiotherapy under Dr. [name], MD's care in [place], [place]. She developed diffuse bone metastasis in March 2016. She has been receiving Letrozol and Palbociclib. She takes Oxycotin 30mg  every 12 hours for pain of the back of hips. MRI of the pelvic on 12/28/2014 shows a worisom lesion in the left femoral leg, measuring 2.5cm. She has been referred for discussion of possible radiation treatments. The only time the patient feels no pain is when she is sitting, after administering pain medication and resting. She strongly verbalized that she is fatigued easily and that her goal via treatment is to "move again" and "run".  The patient denies taking Femara. She additionally denies headache, dizziness, nausea, or vomiting and reports bilateral femur pain 6 on a scale of 0-10. The patient denies pain when sitting, but that this pain is worse the longer she ambulates. She is currently taking oxycontin 30 mg bid to manage pain of the hip. Other medications include intaking miralax to manage constipation. Jill Johnston denies numbness or tingling of extremities, hot flashes, or unintentional weight loss. No edema  was discovered on either extremity. The patient mentioned that she sunburned easily recently, when outdoors. She also stated that she cannot run fast, which has caused her to stop playing tennis. 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 Lastly, the patient strongly vocalized that she wished not to receive tattoos.  PREVIOUS RADIATION THERAPY: Yes  PAST MEDICAL HISTORY:  has a past medical history of Anxiety; Breast cancer; Bone cancer; and radiation therapy.    PAST SURGICAL HISTORY: Past Surgical History  Procedure Laterality Date  . Mastectomy  april 2009    left breast, with lymph nodes removed also  . Cholecystectomy      FAMILY HISTORY: family history is not on file.  SOCIAL HISTORY:  History   Social History  . Marital Status: Married    Spouse Name: N/A  . Number of Children: N/A  . Years of Education: N/A   Occupational History  . Not on file.   Social History Main Topics  . Smoking status: Never Smoker   . Smokeless tobacco: Never Used  . Alcohol Use: No  . Drug Use: No  . Sexual Activity: Yes   Other Topics Concern  . Not on file   Social History Narrative    ALLERGIES: Review of patient's allergies indicates no known allergies.  MEDICATIONS:  Current Outpatient Prescriptions  Medication Sig Dispense Refill  . Calcium Carb-Cholecalciferol (CALCIUM + D3) 600-200 MG-UNIT TABS Take by mouth daily.    Marland Kitchen lisinopril (PRINIVIL,ZESTRIL) 10 MG tablet Take 10 mg by mouth.    . Multiple Vitamin (THERA) TABS Take 1 tablet by mouth.    Marland Kitchen  OxyCODONE HCl ER (OXYCONTIN) 30 MG T12A Take 30 mg by mouth.    . palbociclib (IBRANCE) 100 MG capsule Take 100 mg by mouth.    . polyethylene glycol (MIRALAX / GLYCOLAX) packet Take 17 grams by mouth every day.    . ondansetron (ZOFRAN ODT) 8 MG disintegrating tablet Take 8 mg by mouth.     No current facility-administered medications for this encounter.    REVIEW OF SYSTEMS:  A 15 point review of systems is documented in the  electronic medical record. This was obtained by the nursing staff. However, I reviewed this with the patient to discuss relevant findings and make appropriate changes.  Pertinent items are noted in HPI.   PHYSICAL EXAM:  height is 5\' 5"  (1.651 m) and weight is 153 lb 11.2 oz (69.718 kg). Her blood pressure is 118/88 and her pulse is 92. Her respiration is 16.   BP 118/88 mmHg  Pulse 92  Resp 16  Ht 5\' 5"  (1.651 m)  Wt 153 lb 11.2 oz (69.718 kg)  BMI 25.58 kg/m2  General Appearance:    Alert, cooperative, no distress, appears stated age  Head:    Normocephalic, without obvious abnormality, atraumatic  Eyes:    conjunctiva/corneas clear, EOM's intact,   Ears:    Normal external ear canals, both ears  Nose:   Nares normal, no drainage      Throat:   Lips, mucosa normal  Neck:   symmetrical, trachea midline no enlargement no JVD  Back:     She localizes pain to the mid sacrum  Lungs:     respirations unlabored  Chest Wall:    No deformity   Heart:    Regular rate   Breast Exam:    Not performed  Abdomen:     Soft, non-tender, bowel sounds active all four quadrants,    no masses, no organomegaly  Genitalia:    Normal female without lesion, discharge or tenderness  Rectal:  Not performed  Extremities:   Extremities normal, atraumatic, no cyanosis or edema Sunburn on the shoulders and arms. Does not walk often or it will cause extreme back pain to worsen. Has ceased playing tennis.  Pulses:   2+ and symmetric all extremities  Skin:   Skin color, texture, turgor normal, no rashes or lesions. Sunburn on the shoulders and arms.  Lymph nodes:   Cervical, supraclavicular, and axillary nodes normal  Neurologic:   CNII-XII intact, normal strength, sensation and reflexes    throughout    KPS =  80  100 - Normal; no complaints; no evidence of disease. 90   - Able to carry on normal activity; minor signs or symptoms of disease. 80   - Normal activity with effort; some signs or symptoms of  disease. 71   - Cares for self; unable to carry on normal activity or to do active work. 60   - Requires occasional assistance, but is able to care for most of his personal needs. 50   - Requires considerable assistance and frequent medical care. 49   - Disabled; requires special care and assistance. 22   - Severely disabled; hospital admission is indicated although death not imminent. 30   - Very sick; hospital admission necessary; active supportive treatment necessary. 10   - Moribund; fatal processes progressing rapidly. 0     - Dead  Karnofsky DA, Abelmann WH, Craver LS and Burchenal [facility] [phone]) The use of the nitrogen mustards in the palliative treatment of carcinoma:  with particular reference to bronchogenic carcinoma Cancer 1 634-56  LABORATORY DATA:  Lab Results  Component Value Date   WBC 9.0 10/01/2014   HGB 14.2 10/01/2014   HCT 43.2 10/01/2014   MCV 93.3 10/01/2014   PLT 239 10/01/2014   No results found for: NA, K, CL, CO2 No results found for: ALT, AST, GGT, ALKPHOS, BILITOT   RADIOGRAPHY: Mr Pelvis Wo Contrast  12/28/2014   CLINICAL DATA:  Metastatic breast cancer. Pelvic pain. LEFT leg pain. Symptoms for several months.  EXAM: MRI PELVIS WITHOUT CONTRAST  TECHNIQUE: Multiplanar multisequence MR imaging of the pelvis was performed. No intravenous contrast was administered.  COMPARISON:  10/04/2014.  FINDINGS: Diffuse bony metastatic disease has progressed compared to prior exam. This assessment is made based on several index lesions. The bilateral femoral neck metastases show more effacement of fatty marrow. For reference, the anterior LEFT femoral neck metastasis measures 25 mm x 19 mm on today's exam, previously 13 mm x 13 mm. The RIGHT femoral neck metastasis is also larger.  Progressive loss of normal fatty marrow in the medial iliac bones, greater on the RIGHT than LEFT. There is no pathologic fracture of the pelvis identified. No pathologic fracture of the femoral necks.  Small bilateral hip effusions have developed since the prior exam, likely reactive to the metastatic disease along the intra-articular cortex.  There is no denervation atrophy or edema in hip girdle musculature. The visceral pelvis shows no acute abnormality. Sacral metastases appear larger  LEFT S1 foraminal metastatic disease in the lateral margin of the foramen was better seen on prior lumbar spine MRI. This is probably unchanged allowing for technique on today's examination.  Of the lumbosacral junction metastatic disease may be slightly worse than on the prior lumbar spine MRI 10/03/2014.  IMPRESSION: 1. Progression of metastatic disease with slight increase in number and definite increase in size of previously identified metastases (10/04/2014). 2. Interval growth of bilateral femoral neck metastatic lesions without a pathologic fracture. Small hip effusions associated with femoral neck metastases.   Electronically Signed   By: Dereck Ligas M.D.   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   PLAN: Today, I talked to the patient and family about the findings and work-up thus far.  We discussed the natural history of pelvic bone metastases and fracture risk and general treatment, highlighting the role of radiotherapy in the management.  We discussed the available radiation techniques, and focused on the details of logistics and delivery.  We reviewed the anticipated acute and late sequelae associated with radiation in this setting.  The patient was encouraged to ask questions that I answered to the best of my ability.  I filled out a patient counseling form during our discussion including treatment diagrams.  We retained a copy for our records.  The patient would like to proceed with radiation, will be scheduled for CT simulation, and was advised of future  appointments. If the patient or her spouse develop any additional questions or concerns in regards to her treatment, they were encouraged to contact Dr. [name], MD. It has been noted that the patient strongly vocalized not wishing to receive tattoos during her treatment.  I spent 60 minutes minutes face to face with the patient and more than 50% of that time was spent in counseling and/or coordination of care.  This document serves as a record of services personally performed by [name], MD. It was created on his behalf by Lenn Cal, a trained medical scribe. The creation of this record is based on the scribe's personal observations and the provider's statements to them. This document has been checked and approved by the attending provider.  ______________________________________________________________________________________________________________Sheral Apley. [name], M.D.

## 2015-01-07 ENCOUNTER — Ambulatory Visit
Admission: RE | Admit: 2015-01-07 | Discharge: 2015-01-07 | Disposition: A | Discharge status: Home / Self Care | Payer: BLUE CROSS/BLUE SHIELD | Source: Ambulatory Visit | Attending: Radiation Oncology | Admitting: Radiation Oncology

## 2015-01-07 DIAGNOSIS — C50912 Malignant neoplasm of unspecified site of left female breast: Secondary | ICD-10-CM | POA: Diagnosis not present

## 2015-01-07 DIAGNOSIS — C7951 Secondary malignant neoplasm of bone: Secondary | ICD-10-CM | POA: Insufficient documentation

## 2015-01-07 NOTE — Progress Notes (Signed)
  Radiation Oncology         [phone] ________________________________  Name: Jill Johnston MRN: [id number]  Date: 01/07/2015  DOB: 1966/06/17  SIMULATION AND TREATMENT PLANNING NOTE    ICD-9-CM ICD-10-CM   1. Malignant neoplasm of left breast 174.9 C50.912   2. 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  Identity was confirmed.  All relevant records and images related to the planned course of therapy were reviewed.  The patient freely provided informed written consent to proceed with treatment after reviewing the details related to the planned course of therapy. The consent form was witnessed and verified by the simulation staff.  Then, the patient was set-up in a stable reproducible  supine position for radiation therapy.  CT images were obtained.  Surface markings were placed.  The CT images were loaded into the planning software.  Then the target and avoidance structures were contoured.  Treatment planning then occurred.  The radiation prescription was entered and confirmed.  Then, I designed and supervised the construction of a total of 7 medically necessary complex treatment devices including a BodyFix molded pillow and 6 multileaf collimator apertures to conform radiation around the metastatic deposits while shielding the critical bowel bladder rectum using dynamic conformal arcs segments.  I have requested : 3D Simulation  I have requested a DVH of the following structures: Bladder, small bowel, rectum, left femoral head and right femoral head.   PLAN:  The patient will receive 30 Gy in 10 fractions.  ________________________________  Sheral Apley [name], M.D.

## 2015-01-09 DIAGNOSIS — C50912 Malignant neoplasm of unspecified site of left female breast: Secondary | ICD-10-CM | POA: Diagnosis not present

## 2015-01-10 ENCOUNTER — Ambulatory Visit
Admission: RE | Admit: 2015-01-10 | Discharge: 2015-01-10 | Disposition: A | Discharge status: Home / Self Care | Payer: BLUE CROSS/BLUE SHIELD | Source: Ambulatory Visit | Attending: Radiation Oncology | Admitting: Radiation Oncology

## 2015-01-10 DIAGNOSIS — C50912 Malignant neoplasm of unspecified site of left female breast: Secondary | ICD-10-CM | POA: Diagnosis not present

## 2015-01-11 ENCOUNTER — Ambulatory Visit
Admission: RE | Admit: 2015-01-11 | Discharge: 2015-01-11 | Disposition: A | Discharge status: Home / Self Care | Payer: BLUE CROSS/BLUE SHIELD | Source: Ambulatory Visit | Attending: Radiation Oncology | Admitting: Radiation Oncology

## 2015-01-11 ENCOUNTER — Encounter: Payer: Self-pay | Admitting: Radiation Oncology

## 2015-01-11 VITALS — BP 122/79 | HR 76 | Resp 16 | Wt 153.9 lb

## 2015-01-11 DIAGNOSIS — C7951 Secondary malignant neoplasm of bone: Secondary | ICD-10-CM

## 2015-01-11 DIAGNOSIS — C50912 Malignant neoplasm of unspecified site of left female breast: Secondary | ICD-10-CM | POA: Diagnosis not present

## 2015-01-11 NOTE — Progress Notes (Addendum)
Weight and vitals stable. Denies pain while sitting. Reports when ambulating pain increases. Reports taking oxycodone 30 mg this morning. No edema of bilateral lower extremities noted. Steady gait noted.   BP 122/79 mmHg  Pulse 76  Resp 16  Wt 153 lb 14.4 oz (69.809 kg) Wt Readings from Last 3 Encounters:  01/11/15 153 lb 14.4 oz (69.809 kg)  01/03/15 153 lb 11.2 oz (69.718 kg)  10/01/14 158 lb (71.668 kg)   Oriented patient to staff and routine of the clinic. Provided patient with RADIATION THERAPY AND YOU handbook then, reviewed pertinent information. Educated patient reference potential side effects and management such as, fatigue, skin changes and diarrhea. Encouraged patient to contact this RN with future needs and provided her with a business card. Patient verbalized understanding of all reviewed.

## 2015-01-14 ENCOUNTER — Ambulatory Visit
Admission: RE | Admit: 2015-01-14 | Discharge: 2015-01-14 | Disposition: A | Discharge status: Home / Self Care | Payer: BLUE CROSS/BLUE SHIELD | Source: Ambulatory Visit | Attending: Radiation Oncology | Admitting: Radiation Oncology

## 2015-01-14 DIAGNOSIS — C50912 Malignant neoplasm of unspecified site of left female breast: Secondary | ICD-10-CM | POA: Diagnosis not present

## 2015-01-15 ENCOUNTER — Ambulatory Visit
Admission: RE | Admit: 2015-01-15 | Discharge: 2015-01-15 | Disposition: A | Discharge status: Home / Self Care | Payer: BLUE CROSS/BLUE SHIELD | Source: Ambulatory Visit | Attending: Radiation Oncology | Admitting: Radiation Oncology

## 2015-01-15 DIAGNOSIS — C50912 Malignant neoplasm of unspecified site of left female breast: Secondary | ICD-10-CM | POA: Diagnosis not present

## 2015-01-16 ENCOUNTER — Ambulatory Visit
Admission: RE | Admit: 2015-01-16 | Discharge: 2015-01-16 | Disposition: A | Discharge status: Home / Self Care | Payer: BLUE CROSS/BLUE SHIELD | Source: Ambulatory Visit | Attending: Radiation Oncology | Admitting: Radiation Oncology

## 2015-01-16 DIAGNOSIS — C50912 Malignant neoplasm of unspecified site of left female breast: Secondary | ICD-10-CM | POA: Diagnosis not present

## 2015-01-17 ENCOUNTER — Ambulatory Visit
Admission: RE | Admit: 2015-01-17 | Discharge: 2015-01-17 | Disposition: A | Discharge status: Home / Self Care | Payer: BLUE CROSS/BLUE SHIELD | Source: Ambulatory Visit | Attending: Radiation Oncology | Admitting: Radiation Oncology

## 2015-01-17 DIAGNOSIS — C50912 Malignant neoplasm of unspecified site of left female breast: Secondary | ICD-10-CM | POA: Diagnosis not present

## 2015-01-18 ENCOUNTER — Ambulatory Visit
Admission: RE | Admit: 2015-01-18 | Discharge: 2015-01-18 | Disposition: A | Discharge status: Home / Self Care | Payer: BLUE CROSS/BLUE SHIELD | Source: Ambulatory Visit | Attending: Radiation Oncology | Admitting: Radiation Oncology

## 2015-01-18 ENCOUNTER — Encounter: Payer: Self-pay | Admitting: Radiation Oncology

## 2015-01-18 VITALS — BP 120/85 | HR 81 | Resp 16 | Wt 153.4 lb

## 2015-01-18 DIAGNOSIS — C50912 Malignant neoplasm of unspecified site of left female breast: Secondary | ICD-10-CM | POA: Diagnosis not present

## 2015-01-18 DIAGNOSIS — C7951 Secondary malignant neoplasm of bone: Secondary | ICD-10-CM

## 2015-01-18 NOTE — Progress Notes (Signed)
Weight and vitals stable. Denies pain while sitting. Reports when ambulating pain is less. Reports taking oxycodone 30 mg this morning. No edema of bilateral lower extremities noted. Steady gait noted.  BP 120/85 mmHg  Pulse 81  Resp 16  Wt 153 lb 6.4 oz (69.582 kg) Wt Readings from Last 3 Encounters:  01/18/15 153 lb 6.4 oz (69.582 kg)  01/11/15 153 lb 14.4 oz (69.809 kg)  01/03/15 153 lb 11.2 oz (69.718 kg)

## 2015-01-18 NOTE — Progress Notes (Signed)
  Radiation Oncology         [phone]   Name: Jill Johnston MRN: [id number]   Date: 01/18/2015  DOB: 1966/02/04     Weekly Radiation Therapy Management    ICD-9-CM ICD-10-CM   1. Bone metastases 198.5 C79.51     Current Dose: 21 Gy  Planned Dose:  30 Gy  Narrative The patient presents for routine under treatment assessment. Weight and vitals stable. Denies pain while sitting. Reports when ambulating pain is less. Reports taking oxycodone 30 mg this morning. No edema of bilateral lower extremities noted. Steady gait noted. Reports spine and T11-L1 pain.  The patient is without complaint. Set-up films were reviewed. The chart was checked.  Physical Findings  weight is 153 lb 6.4 oz (69.582 kg). Her blood pressure is 120/85 and her pulse is 81. Her respiration is 16. . Weight essentially stable.  No significant changes.   Impression The patient is tolerating radiation.  Plan Continue treatment as planned.    This document serves as a record of services personally performed by [name], MD. It was created on his behalf by Arlyce Harman, a trained medical scribe. The creation of this record is based on the scribe's personal observations and the provider's statements to them. This document has been checked and approved by the attending provider.      Sheral Apley [name], M.D.

## 2015-01-21 ENCOUNTER — Ambulatory Visit
Admission: RE | Admit: 2015-01-21 | Discharge: 2015-01-21 | Disposition: A | Discharge status: Home / Self Care | Payer: BLUE CROSS/BLUE SHIELD | Source: Ambulatory Visit | Attending: Radiation Oncology | Admitting: Radiation Oncology

## 2015-01-21 DIAGNOSIS — C50912 Malignant neoplasm of unspecified site of left female breast: Secondary | ICD-10-CM | POA: Diagnosis not present

## 2015-01-22 ENCOUNTER — Ambulatory Visit
Admission: RE | Admit: 2015-01-22 | Discharge: 2015-01-22 | Disposition: A | Discharge status: Home / Self Care | Payer: BLUE CROSS/BLUE SHIELD | Source: Ambulatory Visit | Attending: Radiation Oncology | Admitting: Radiation Oncology

## 2015-01-22 DIAGNOSIS — C50912 Malignant neoplasm of unspecified site of left female breast: Secondary | ICD-10-CM | POA: Diagnosis not present

## 2015-01-23 ENCOUNTER — Ambulatory Visit
Admission: RE | Admit: 2015-01-23 | Payer: BLUE CROSS/BLUE SHIELD | Source: Ambulatory Visit | Admitting: Radiation Oncology

## 2015-01-23 ENCOUNTER — Telehealth: Payer: Self-pay

## 2015-01-23 ENCOUNTER — Other Ambulatory Visit: Payer: Self-pay | Admitting: Radiation Oncology

## 2015-01-23 ENCOUNTER — Ambulatory Visit
Admission: RE | Admit: 2015-01-23 | Discharge: 2015-01-23 | Disposition: A | Discharge status: Home / Self Care | Payer: BLUE CROSS/BLUE SHIELD | Source: Ambulatory Visit | Attending: Radiation Oncology | Admitting: Radiation Oncology

## 2015-01-23 ENCOUNTER — Encounter: Payer: Self-pay | Admitting: Radiation Oncology

## 2015-01-23 DIAGNOSIS — C7951 Secondary malignant neoplasm of bone: Secondary | ICD-10-CM

## 2015-01-23 DIAGNOSIS — C50912 Malignant neoplasm of unspecified site of left female breast: Secondary | ICD-10-CM | POA: Diagnosis not present

## 2015-01-23 NOTE — Telephone Encounter (Signed)
Patient notified to come in at 2:15 pm Thursday 01/24/15 for labs.She agreed.Confirmed with Sharyn Lull RN at Dr.Darovsky's office to draw cbc with diff, cmet and CA27.29.

## 2015-01-24 ENCOUNTER — Ambulatory Visit
Admission: RE | Admit: 2015-01-24 | Discharge: 2015-01-24 | Disposition: A | Discharge status: Home / Self Care | Payer: BLUE CROSS/BLUE SHIELD | Source: Ambulatory Visit | Attending: Radiation Oncology | Admitting: Radiation Oncology

## 2015-01-24 DIAGNOSIS — Z79899 Other long term (current) drug therapy: Secondary | ICD-10-CM | POA: Insufficient documentation

## 2015-01-24 DIAGNOSIS — C50912 Malignant neoplasm of unspecified site of left female breast: Secondary | ICD-10-CM | POA: Diagnosis present

## 2015-01-24 DIAGNOSIS — C7951 Secondary malignant neoplasm of bone: Secondary | ICD-10-CM | POA: Diagnosis not present

## 2015-01-24 DIAGNOSIS — C7949 Secondary malignant neoplasm of other parts of nervous system: Secondary | ICD-10-CM | POA: Insufficient documentation

## 2015-01-24 DIAGNOSIS — Z510 Encounter for antineoplastic radiation therapy: Secondary | ICD-10-CM | POA: Insufficient documentation

## 2015-01-24 DIAGNOSIS — Z808 Family history of malignant neoplasm of other organs or systems: Secondary | ICD-10-CM | POA: Insufficient documentation

## 2015-01-24 DIAGNOSIS — Z170 Estrogen receptor positive status [ER+]: Secondary | ICD-10-CM | POA: Insufficient documentation

## 2015-01-24 LAB — CBC WITH DIFFERENTIAL/PLATELET
BASO%: 3.5 % — ABNORMAL HIGH (ref 0.0–2.0)
BASOS ABS: 0.1 10*3/uL (ref 0.0–0.1)
EOS%: 5 % (ref 0.0–7.0)
Eosinophils Absolute: 0.1 10*3/uL (ref 0.0–0.5)
HEMATOCRIT: 38.9 % (ref 34.8–46.6)
HGB: 13.2 g/dL (ref 11.6–15.9)
LYMPH%: 37.6 % (ref 14.0–49.7)
MCH: 34.1 pg — ABNORMAL HIGH (ref 25.1–34.0)
MCHC: 33.9 g/dL (ref 31.5–36.0)
MCV: 100.5 fL (ref 79.5–101.0)
MONO#: 0.2 10*3/uL (ref 0.1–0.9)
MONO%: 12.8 % (ref 0.0–14.0)
NEUT#: 0.6 10*3/uL — ABNORMAL LOW (ref 1.5–6.5)
NEUT%: 41.1 % (ref 38.4–76.8)
Platelets: 119 10*3/uL — ABNORMAL LOW (ref 145–400)
RBC: 3.87 10*6/uL (ref 3.70–5.45)
RDW: 14.8 % — AB (ref 11.2–14.5)
WBC: 1.4 10*3/uL — AB (ref 3.9–10.3)
lymph#: 0.5 10*3/uL — ABNORMAL LOW (ref 0.9–3.3)
nRBC: 0 % (ref 0–0)

## 2015-01-24 LAB — COMPREHENSIVE METABOLIC PANEL (CC13)
ALT: 53 U/L (ref 0–55)
AST: 36 U/L — ABNORMAL HIGH (ref 5–34)
Albumin: 3.9 g/dL (ref 3.5–5.0)
Alkaline Phosphatase: 65 U/L (ref 40–150)
Anion Gap: 6 mEq/L (ref 3–11)
BUN: 10 mg/dL (ref 7.0–26.0)
CHLORIDE: 108 meq/L (ref 98–109)
CO2: 24 mEq/L (ref 22–29)
CREATININE: 0.7 mg/dL (ref 0.6–1.1)
Calcium: 7.9 mg/dL — ABNORMAL LOW (ref 8.4–10.4)
EGFR: >90 mL/min/{1.73_m2} (ref >90)
GLUCOSE: 121 mg/dL (ref 70–140)
Potassium: 4.2 mEq/L (ref 3.5–5.1)
SODIUM: 139 meq/L (ref 136–145)
Total Bilirubin: 0.32 mg/dL (ref 0.20–1.20)
Total Protein: 6.7 g/dL (ref 6.4–8.3)

## 2015-01-24 NOTE — Progress Notes (Signed)
  Radiation Oncology         [phone] ________________________________  Name: Jill Johnston MRN: [id number]  Date: 01/24/2015  DOB: August 13, 1965  SIMULATION AND TREATMENT PLANNING NOTE  DIAGNOSIS: Jill Johnston is a 49 year old woman presenting to clinic in regards to her bone metastases.  NARRATIVE:  The patient was brought to the [facility].  Identity was confirmed.  All relevant records and images related to the planned course of therapy were reviewed.  The patient freely provided informed written consent to proceed with treatment after reviewing the details related to the planned course of therapy. The consent form was witnessed and verified by the simulation staff.  Then, the patient was set-up in a stable reproducible  supine position for radiation therapy.  CT images were obtained.  Surface markings were placed.  The CT images were loaded into the planning software.  Then the target and avoidance structures were contoured.  Treatment planning then occurred.  The radiation prescription was entered and confirmed.  Then, I designed and supervised the construction of a total of 5 medically necessary complex treatment devices including body fix mold and 4 MLC blocks to shield lungs, heart, and kidneys.  I have requested : Isodose Plan.    PLAN:  The patient will receive 30 Gy in 10 fractions  This document serves as a record of services personally performed by [name], MD. It was created on his behalf by Lenn Cal, a trained medical scribe. The creation of this record is based on the scribe's personal observations and the provider's statements to them. This document has been checked and approved by the attending provider. ________________________________  Sheral Apley. [name], M.D.

## 2015-01-25 DIAGNOSIS — C50912 Malignant neoplasm of unspecified site of left female breast: Secondary | ICD-10-CM | POA: Diagnosis not present

## 2015-01-25 LAB — CANCER ANTIGEN 27.29: CA 27.29: 32 U/mL (ref 0–39)

## 2015-02-01 ENCOUNTER — Ambulatory Visit
Admission: RE | Admit: 2015-02-01 | Discharge: 2015-02-01 | Disposition: A | Discharge status: Home / Self Care | Payer: BLUE CROSS/BLUE SHIELD | Source: Ambulatory Visit | Attending: Internal Medicine | Admitting: Internal Medicine

## 2015-02-04 ENCOUNTER — Ambulatory Visit: Payer: BLUE CROSS/BLUE SHIELD | Admitting: Radiation Oncology

## 2015-02-04 ENCOUNTER — Encounter: Payer: Self-pay | Admitting: Radiation Oncology

## 2015-02-04 ENCOUNTER — Ambulatory Visit
Admission: RE | Admit: 2015-02-04 | Discharge: 2015-02-04 | Disposition: A | Discharge status: Home / Self Care | Payer: BLUE CROSS/BLUE SHIELD | Source: Ambulatory Visit | Attending: Radiation Oncology | Admitting: Radiation Oncology

## 2015-02-04 DIAGNOSIS — C50912 Malignant neoplasm of unspecified site of left female breast: Secondary | ICD-10-CM | POA: Diagnosis not present

## 2015-02-04 DIAGNOSIS — C7951 Secondary malignant neoplasm of bone: Secondary | ICD-10-CM

## 2015-02-04 NOTE — Progress Notes (Signed)
Lab results:cmet, CA 27.29 and cbc w/diff. Drawn on 01/24/15 was faxed to Dr.Darvovsky's office by Romie Jumper on 01/25/2015.

## 2015-02-04 NOTE — Progress Notes (Signed)
  Radiation Oncology         [phone] ________________________________  Name: Jill Johnston MRN: [id number]  Date: 02/04/2015  DOB: 1965/09/09  Chart Note:  Patient's husband presented to the clinic today requesting to speak with me. He reports his wife's chemo treatment has been put on hold due to a low WBC. He explains Dr. Jacquiline Doe is requesting radiation treatment scheduled to start today be delayed for one month.   I talked to patient's husband and agree with delay.  We will arrange follow-up in one month.  ________________________________  Sheral Apley. [name], M.D.

## 2015-02-04 NOTE — Progress Notes (Signed)
Patient's husband presented to the clinic today requesting to speak with Dr. [name]. He reports his wife's chemo treatment has been put on hold due to a low WBC. He explains the med oncologist is requesting radiation treatment scheduled to start today be delayed for one month. Patient's husband is requesting to speak with Dr. [name] to obtain a better understanding of the new plan.

## 2015-02-05 ENCOUNTER — Ambulatory Visit: Payer: BLUE CROSS/BLUE SHIELD

## 2015-02-05 ENCOUNTER — Ambulatory Visit: Payer: BLUE CROSS/BLUE SHIELD | Admitting: Radiation Oncology

## 2015-02-06 ENCOUNTER — Ambulatory Visit: Payer: BLUE CROSS/BLUE SHIELD

## 2015-02-06 ENCOUNTER — Telehealth: Payer: Self-pay | Admitting: Radiation Oncology

## 2015-02-06 NOTE — Telephone Encounter (Signed)
Left message on patient's home phone and husband's cell phone of follow up appointment with Dr. [name] on 03/07/2015 at 2:15.

## 2015-02-07 ENCOUNTER — Ambulatory Visit: Payer: BLUE CROSS/BLUE SHIELD

## 2015-02-08 ENCOUNTER — Ambulatory Visit: Payer: BLUE CROSS/BLUE SHIELD

## 2015-02-11 ENCOUNTER — Ambulatory Visit: Payer: BLUE CROSS/BLUE SHIELD

## 2015-02-12 ENCOUNTER — Ambulatory Visit: Payer: BLUE CROSS/BLUE SHIELD

## 2015-02-13 ENCOUNTER — Ambulatory Visit: Payer: BLUE CROSS/BLUE SHIELD

## 2015-02-14 ENCOUNTER — Ambulatory Visit: Payer: BLUE CROSS/BLUE SHIELD

## 2015-02-15 ENCOUNTER — Ambulatory Visit: Payer: BLUE CROSS/BLUE SHIELD

## 2015-02-18 ENCOUNTER — Ambulatory Visit: Payer: BLUE CROSS/BLUE SHIELD

## 2015-02-25 DIAGNOSIS — C801 Malignant (primary) neoplasm, unspecified: Secondary | ICD-10-CM | POA: Diagnosis not present

## 2015-02-25 DIAGNOSIS — Z510 Encounter for antineoplastic radiation therapy: Secondary | ICD-10-CM | POA: Diagnosis not present

## 2015-02-25 DIAGNOSIS — C7951 Secondary malignant neoplasm of bone: Secondary | ICD-10-CM | POA: Diagnosis not present

## 2015-03-07 ENCOUNTER — Ambulatory Visit
Admission: RE | Admit: 2015-03-07 | Discharge: 2015-03-07 | Disposition: A | Discharge status: Home / Self Care | Payer: BLUE CROSS/BLUE SHIELD | Source: Ambulatory Visit | Attending: Radiation Oncology | Admitting: Radiation Oncology

## 2015-03-07 ENCOUNTER — Ambulatory Visit
Admission: RE | Admit: 2015-03-07 | Payer: BLUE CROSS/BLUE SHIELD | Source: Ambulatory Visit | Admitting: Radiation Oncology

## 2015-03-07 ENCOUNTER — Encounter: Payer: Self-pay | Admitting: Radiation Oncology

## 2015-03-07 VITALS — BP 120/88 | HR 79 | Resp 16 | Wt 152.8 lb

## 2015-03-07 DIAGNOSIS — C50912 Malignant neoplasm of unspecified site of left female breast: Secondary | ICD-10-CM

## 2015-03-07 DIAGNOSIS — C7951 Secondary malignant neoplasm of bone: Secondary | ICD-10-CM

## 2015-03-07 NOTE — Progress Notes (Signed)
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 Bone metastases 198.5 C79.51   2. Malignant neoplasm of left breast 174.9 C50.912     Interval Since Last Radiation:  1  months  Narrative:  The patient returns today for routine follow-up.  Radiation treatment held due to low WBC. Patients WBC are now within normal limits Patient resume her chemo pill on Saturday. Patient without complaints today. Reports she is scheduled to be evaluated by cardiologist the end of this month following an episodes of pain in her tooth that radiated to her chest. Patient is still experiencing rib and back pain. Reports her hip pain has improved.                               ALLERGIES:  has No Known Allergies.  Meds: Current Outpatient Prescriptions  Medication Sig Dispense Refill  . nitroGLYCERIN (NITROSTAT) 0.4 MG SL tablet Place 0.4 mg under the tongue.    . OxyCODONE HCl ER (OXYCONTIN) 30 MG T12A Take 30 mg by mouth.    . palbociclib (IBRANCE) 100 MG capsule Take 100 mg by mouth.    . polyethylene glycol (MIRALAX / GLYCOLAX) packet Take 17 g by mouth.    . Calcium Carb-Cholecalciferol (CALCIUM + D3) 600-200 MG-UNIT TABS Take by mouth daily.    . fentaNYL (DURAGESIC - DOSED MCG/HR) 25 MCG/HR patch UNWRAP AND APP 1 PATCH TO SKIN AND CHANGE Q 3 DAYS  0  . letrozole (FEMARA) 2.5 MG tablet TK 1 T PO  QD  9  . lisinopril (PRINIVIL,ZESTRIL) 10 MG tablet Take 10 mg by mouth.    . metoCLOPramide (REGLAN) 10 MG tablet   0  . Multiple Vitamin (THERA) TABS Take 1 tablet by mouth.    . ondansetron (ZOFRAN ODT) 8 MG disintegrating tablet Take 8 mg by mouth.    . Oxycodone HCl 10 MG TABS TK 1-2 TS PO Q 3 H PRN FOR  PAIN  0  . palbociclib (IBRANCE) 100 MG capsule Take 100 mg by mouth.    . polyethylene glycol (MIRALAX / GLYCOLAX) packet Take 17 grams by mouth every day.     No current facility-administered medications for this encounter.    Physical Findings: The patient is in no acute distress. Patient is alert and oriented.  weight is 152 lb 12.8 oz (69.31 kg). Her blood pressure is 120/88 and her pulse is 79. Her respiration is 16. .  No significant changes.  Lab Findings: Lab Results  Component Value Date   WBC 1.4* 01/24/2015   WBC 9.0 10/01/2014   HGB 13.2 01/24/2015   HGB 14.2 10/01/2014   HCT 38.9 01/24/2015   HCT 43.2 10/01/2014   PLT 119* 01/24/2015   PLT 239 10/01/2014    Lab Results  Component Value Date   NA 139 01/24/2015   K 4.2 01/24/2015   CHLORIDE 108 01/24/2015   CO2 24 01/24/2015   GLUCOSE 121 01/24/2015   BUN 10.0 01/24/2015   CREATININE 0.7 01/24/2015   BILITOT 0.32 01/24/2015   ALKPHOS 65 01/24/2015   AST 36* 01/24/2015  ALT 53 01/24/2015   PROT 6.7 01/24/2015   ALBUMIN 3.9 01/24/2015   CALCIUM 7.9* 01/24/2015   ANIONGAP 6 01/24/2015   Impression:  The patient is recovering from the effects of radiation.  Her white count remains low and at present, her skeletal pain is not severe. For these two reasons, I would hold off on radiation therapy for one month and revisit radiotherapy at that time.  Plan:  Follow up in 1 month.  This document serves as a record of services personally performed by [name], MD. It was created on his behalf by Arlyce Harman, a trained medical scribe. The creation of this record is based on the scribe's personal observations and the provider's statements to them. This document has been checked and approved by the attending provider.    _____________________________________  Sheral Apley. [name], M.D.

## 2015-03-07 NOTE — Progress Notes (Addendum)
Radiation treatment held due to low WBC. August labs from [facility] in chart for Dr. [name] to review.  Patient resumed her chemo pill on Saturday. Patient without complaints today. Reports she is scheduled to be evaluated by cardiologist the end of this month following an episodes of pain in her tooth that radiated to her chest.   BP 120/88 mmHg  Pulse 79  Resp 16  Wt 152 lb 12.8 oz (69.31 kg) Wt Readings from Last 3 Encounters:  03/07/15 152 lb 12.8 oz (69.31 kg)  01/18/15 153 lb 6.4 oz (69.582 kg)  01/11/15 153 lb 14.4 oz (69.809 kg)

## 2015-03-20 ENCOUNTER — Ambulatory Visit: Payer: BLUE CROSS/BLUE SHIELD | Admitting: Cardiology

## 2015-03-21 ENCOUNTER — Ambulatory Visit (INDEPENDENT_AMBULATORY_CARE_PROVIDER_SITE_OTHER): Payer: BLUE CROSS/BLUE SHIELD | Admitting: Cardiology

## 2015-03-21 ENCOUNTER — Encounter: Payer: Self-pay | Admitting: *Deleted

## 2015-03-21 ENCOUNTER — Encounter: Payer: Self-pay | Admitting: Cardiology

## 2015-03-21 VITALS — BP 113/82 | HR 75 | Ht 65.0 in | Wt 154.4 lb

## 2015-03-21 DIAGNOSIS — R072 Precordial pain: Secondary | ICD-10-CM

## 2015-03-21 DIAGNOSIS — Z853 Personal history of malignant neoplasm of breast: Secondary | ICD-10-CM | POA: Diagnosis not present

## 2015-03-21 DIAGNOSIS — Z136 Encounter for screening for cardiovascular disorders: Secondary | ICD-10-CM

## 2015-03-21 DIAGNOSIS — Z8249 Family history of ischemic heart disease and other diseases of the circulatory system: Secondary | ICD-10-CM | POA: Diagnosis not present

## 2015-03-21 NOTE — Progress Notes (Signed)
Cardiology Office Note  Date: 03/21/2015   ID: Jill Johnston, DOB 01/05/1966, MRN [id number]  Referring provider: Audree Camel, MD  Consulting Cardiologist: Rozann Lesches, MD   Chief Complaint  Patient presents with  . Chest discomfort    History of Present Illness: Jill Johnston is a 49 y.o. female referred for cardiology consultation by Dr. Jacquiline Doe. She reports an episode of chest discomfort about a month ago when she is lying in bed, no obvious precipitant. She states that it initially began as a left-sided jaw/tooth discomfort, radiated down into the left side of her chest and left arm. Since that time she has not had any specific symptom recurrence.  I reviewed her medical history. She has undergone previous chemotherapy, radiation, and surgery for left-sided breast cancer as indicated below. Within the last year she was found to have bone metastasis to her left femur and pelvic bone, underwent additional radiation treatments. She is doing much better at this time in terms of pain and activity.  She is originally from [place], [place] and also lived in [place]. 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 She is still working at this time, although at less level of intensity. She does not report any recurring chest discomfort with her typical activities.  She has a history of CAD in both parents, her mother died with a heart attack in her 30s. She has not undergone any previous cardiac ischemic testing. ECG today in the office is normal.   Past Medical History  Diagnosis Date  . Anxiety   . 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 Mastectomy  April 2009    Left breast with lymph node resection  . Cholecystectomy      Current Outpatient Prescriptions  Medication Sig Dispense Refill  . Calcium  Carb-Cholecalciferol (CALCIUM + D3) 600-200 MG-UNIT TABS Take 1 tablet by mouth daily.     . Multiple Vitamin (THERA) TABS Take 1 tablet by mouth daily.     . nitroGLYCERIN (NITROSTAT) 0.4 MG SL tablet Place 0.4 mg under the tongue every 5 (five) minutes as needed.     . ondansetron (ZOFRAN ODT) 8 MG disintegrating tablet Take 8 mg by mouth as needed.     . OxyCODONE HCl ER (OXYCONTIN) 30 MG T12A Take 30 mg by mouth 2 (two) times daily.     . palbociclib (IBRANCE) 100 MG capsule Take 100 mg by mouth daily.     . polyethylene glycol (MIRALAX / GLYCOLAX) packet Take 17 grams by mouth every day.     No current facility-administered medications for this visit.    Allergies:  Review of patient's allergies indicates no known allergies.   Social History: The patient  reports that she has never smoked. She has never used smokeless tobacco. She reports that she does not drink alcohol or use illicit drugs.   Family History: The patient's family history includes Brain cancer in her father; Heart attack in her mother; Heart disease in her father.   ROS:  Please see the history of present illness. Otherwise, complete review of systems is positive for previous difficulty with pelvic and leg pain when she was diagnosed with bone metastasis, although this has significantly improved recently.  All other systems are reviewed and negative.   Physical Exam: VS:  BP  113/82 mmHg  Pulse 75  Ht 5\' 5"  (1.651 m)  Wt 154 lb 6.4 oz (70.035 kg)  BMI 25.69 kg/m2  SpO2 95%, BMI Body mass index is 25.69 kg/(m^2).  Wt Readings from Last 3 Encounters:  03/21/15 154 lb 6.4 oz (70.035 kg)  03/07/15 152 lb 12.8 oz (69.31 kg)  01/18/15 153 lb 6.4 oz (69.582 kg)     General: Patient appears comfortable at rest. HEENT: Conjunctiva and lids normal, oropharynx clear. Neck: Supple, no elevated JVP or carotid bruits, no thyromegaly. Lungs: Clear to auscultation, nonlabored breathing at rest. Cardiac: Regular rate and  rhythm, no S3 or significant systolic murmur, no pericardial rub. Abdomen: Soft, nontender, bowel sounds present. Extremities: No pitting edema, distal pulses 2+. Skin: Warm and dry. Musculoskeletal: No kyphosis. Neuropsychiatric: Alert and oriented x3, affect grossly appropriate.   ECG: ECG is ordered today and shows normal sinus rhythm.   Recent Labwork: 01/24/2015: ALT 53; AST 36*; BUN 10.0; Creatinine 0.7; HGB 13.2; Platelets 119*; Potassium 4.2; Sodium 139   Assessment and Plan:  1. Episode of chest discomfort about one month ago as outlined above, somewhat atypical but angina not excluded. She has not had specific recurrence, but has a history of CAD in both parents, has also undergone radiation treatments and chemotherapy related to prior diagnosis of breast cancer and a more recent finding of bone metastasis. ECG is normal today. She has not undergone any prior form of ischemic evaluation. To clarify her cardiac risk further, we will proceed with an exercise echocardiogram.  2. Stage IV left sided breast cancer with evidence of metastasis to bone. She seems to be doing recently well at this time and follows with Dr. Jacquiline Doe.  Current medicines were reviewed with the patient today.   Orders Placed This Encounter  Procedures  . EKG 12-Lead  . ECHO STRESS TEST    Disposition: Call with results.  Signed, Satira Sark, MD, [facility] 03/21/2015 2:28 PM    [facility] [place] [address], [place],  [address] Phone: [phone]; Fax: [phone]

## 2015-03-21 NOTE — Patient Instructions (Signed)
Your physician recommends that you continue on your current medications as directed. Please refer to the Current Medication list given to you today. Your physician has requested that you have a stress echocardiogram. For further information please visit [email or web address]. Please follow instruction sheet as given. We will call you with your results.  

## 2015-03-25 ENCOUNTER — Other Ambulatory Visit (HOSPITAL_COMMUNITY): Payer: Self-pay | Admitting: Internal Medicine

## 2015-03-25 DIAGNOSIS — C7951 Secondary malignant neoplasm of bone: Secondary | ICD-10-CM

## 2015-03-25 DIAGNOSIS — C50919 Malignant neoplasm of unspecified site of unspecified female breast: Secondary | ICD-10-CM

## 2015-03-26 ENCOUNTER — Telehealth: Payer: Self-pay | Admitting: *Deleted

## 2015-03-26 NOTE — Telephone Encounter (Signed)
-----   Message from Satira Sark, MD sent at 03/26/2015  1:16 PM EDT ----- Regarding: RE: Stress Testing It may be worth at least attempting the exercise echocardiogram to see if she can achieve an adequate heart rate response. This could always be changed to a dobutamine echocardiogram at the time of the study if heart rate was not achieved with exercise.  ----- Message -----    From: Veronia Beets    Sent: 03/26/2015  12:11 PM      To: Satira Sark, MD Subject: Stress Testing                                 Called patient to discuss upcoming stress echo on April 03, 2015.  She is concerned she will have to run on the treadmill to get her heart rate to where we need for a diagnostic report. This heart rate would be 100% of her maximum. She does not feel she can to this with her physical issues. I explained the other options she may have to obtain the same information, i.e. Lexiscan or Dobutamine echo.  I advised her to call your office and discuss further.

## 2015-04-03 ENCOUNTER — Ambulatory Visit (HOSPITAL_COMMUNITY): Payer: BLUE CROSS/BLUE SHIELD

## 2015-04-04 ENCOUNTER — Ambulatory Visit
Admission: RE | Admit: 2015-04-04 | Discharge: 2015-04-04 | Disposition: A | Discharge status: Home / Self Care | Payer: BLUE CROSS/BLUE SHIELD | Source: Ambulatory Visit | Attending: Radiation Oncology | Admitting: Radiation Oncology

## 2015-04-12 ENCOUNTER — Other Ambulatory Visit (HOSPITAL_COMMUNITY): Payer: Self-pay | Admitting: Internal Medicine

## 2015-04-12 DIAGNOSIS — C7951 Secondary malignant neoplasm of bone: Secondary | ICD-10-CM

## 2015-04-15 ENCOUNTER — Ambulatory Visit (HOSPITAL_COMMUNITY)
Admission: RE | Admit: 2015-04-15 | Discharge: 2015-04-15 | Disposition: A | Discharge status: Home / Self Care | Payer: BLUE CROSS/BLUE SHIELD | Source: Ambulatory Visit | Attending: Internal Medicine | Admitting: Internal Medicine

## 2015-04-15 ENCOUNTER — Ambulatory Visit (HOSPITAL_COMMUNITY): Payer: BLUE CROSS/BLUE SHIELD

## 2015-04-15 DIAGNOSIS — K429 Umbilical hernia without obstruction or gangrene: Secondary | ICD-10-CM | POA: Insufficient documentation

## 2015-04-15 DIAGNOSIS — Z79899 Other long term (current) drug therapy: Secondary | ICD-10-CM | POA: Insufficient documentation

## 2015-04-15 DIAGNOSIS — Z923 Personal history of irradiation: Secondary | ICD-10-CM | POA: Diagnosis not present

## 2015-04-15 DIAGNOSIS — K760 Fatty (change of) liver, not elsewhere classified: Secondary | ICD-10-CM | POA: Insufficient documentation

## 2015-04-15 DIAGNOSIS — C7951 Secondary malignant neoplasm of bone: Secondary | ICD-10-CM | POA: Diagnosis not present

## 2015-04-15 DIAGNOSIS — C7800 Secondary malignant neoplasm of unspecified lung: Secondary | ICD-10-CM | POA: Insufficient documentation

## 2015-04-15 DIAGNOSIS — C50919 Malignant neoplasm of unspecified site of unspecified female breast: Secondary | ICD-10-CM | POA: Diagnosis present

## 2015-04-15 MED ORDER — IOHEXOL 300 MG/ML  SOLN
75.0000 mL | Freq: Once | INTRAMUSCULAR | Status: AC | PRN
  Administered 2015-04-15: 75 mL via INTRAVENOUS

## 2015-04-16 ENCOUNTER — Telehealth: Payer: Self-pay

## 2015-04-16 NOTE — Telephone Encounter (Signed)
[employer]  AUTH# FOR STRESS ECHO [id number] EXPIRES 05/01/2015 Katie H [facility] [place]

## 2015-04-21 ENCOUNTER — Encounter: Payer: Self-pay | Admitting: Radiation Oncology

## 2015-04-21 NOTE — Progress Notes (Signed)
  Radiation Oncology         [phone]) [phone] ________________________________  Name: Jill Johnston MRN: [id number]  Date: 01/23/2015  DOB: [birth year]  End of Treatment Note  Diagnosis:   49 yo woman with painful pelvic bone metastases from metastatic breast cancer - stage IV    Indication for treatment:  Palliation of pain       Radiation treatment dates:   01/10/15-01/23/15  Site/dose:   Metastatic deposits in the sacrum and femurs were conformally treated to 30 Gy in 10 fractions of 3 Gy  Beams/energy:   A 3D-DCA plan was generated to treat the mets while minimizing dose to bone marrow and bowel.  IGRT with CB-CT was used.  Narrative: The patient tolerated radiation treatment relatively well.   Pain improved.  Plan: The patient has completed radiation treatment. The patient will return to radiation oncology clinic for routine followup in one month. I advised her to call or return sooner if she has any questions or concerns related to her recovery or treatment. ________________________________  Sheral Apley. [name], M.D.

## 2015-06-03 ENCOUNTER — Telehealth: Payer: Self-pay | Admitting: *Deleted

## 2015-06-03 NOTE — Telephone Encounter (Signed)
On 06-03-15 fax medical records to [facility], it was consult note, sim and planning note, end of tx note.

## 2015-07-04 ENCOUNTER — Encounter (INDEPENDENT_AMBULATORY_CARE_PROVIDER_SITE_OTHER): Payer: Self-pay | Admitting: Internal Medicine

## 2015-07-04 ENCOUNTER — Ambulatory Visit (INDEPENDENT_AMBULATORY_CARE_PROVIDER_SITE_OTHER): Payer: BLUE CROSS/BLUE SHIELD | Admitting: Internal Medicine

## 2015-07-04 VITALS — BP 100/66 | HR 68 | Temp 97.7°F | Resp 18 | Ht 65.0 in | Wt 157.7 lb

## 2015-07-04 DIAGNOSIS — K219 Gastro-esophageal reflux disease without esophagitis: Secondary | ICD-10-CM | POA: Insufficient documentation

## 2015-07-04 MED ORDER — PANTOPRAZOLE SODIUM 40 MG PO TBEC: 40.0000 mg | DELAYED_RELEASE_TABLET | Freq: Every day | ORAL | Status: DC

## 2015-07-04 NOTE — Progress Notes (Signed)
Presenting complaint;  Intermittent burning chest pain and nocturnal regurgitation.  History of present illness:  Patient is 50 year old Caucasian female who is referred through courtesy of Dr. Jacquiline Doe GI evaluation. She has complicated history of breast carcinoma which is briefly summarized under past medical history but has been discussed in detail by Dr.Darovsky in his note from 06/21/2015. Patient reports 4 episodes of regurgitation at night when she woke up suddenly and felt excruciating burning in her throat and she believes she aspirated and felt burning airway passage. Last episode occurred about 3 weeks ago. She has changed her eating habits. She also has noted burning with multiple foods including herbal tea Black to see and she also cannot drink alcohol which she has been drinking occasionally. She also has postprandial retrosternal burning and she feels as if her food goes down slowly. She has not had any episode of food impaction. She did take Prilosec OTC for 2 weeks which helped some. She also has taken OTC and acids. Dr. Jacquiline Doe recommended famotidine 20 mg twice a day but she has not taken it on regular basis. She denies nausea vomiting hoarseness chronic cough or sore throat. She also denies epigastric pain melena or rectal bleeding. She developed constipation with use of pain medication but she has responded nicely to polyethylene glycol. She denies melena or rectal bleeding. She also has noted change in her taste buds. She has not lost any weight. She says while she was on hormonal therapy she gained about 10-15 pounds. 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 She recalls it was very unpleasant experience as no sedation was administered.   Current Medications: Outpatient Encounter Prescriptions as of 07/04/2015  Medication Sig  . Calcium Carb-Cholecalciferol (CALCIUM + D3) 600-200 MG-UNIT TABS Take 1  tablet by mouth daily.   . OxyCODONE HCl ER (OXYCONTIN) 30 MG T12A Take 30 mg by mouth 2 (two) times daily.   . palbociclib (IBRANCE) 100 MG capsule Take 100 mg by mouth daily.   . polyethylene glycol (MIRALAX / GLYCOLAX) packet Take 17 grams by mouth every day.  . letrozole (FEMARA) 2.5 MG tablet Reported on 07/04/2015  . nitroGLYCERIN (NITROSTAT) 0.4 MG SL tablet Place 0.4 mg under the tongue every 5 (five) minutes as needed.   . ondansetron (ZOFRAN ODT) 8 MG disintegrating tablet Take 8 mg by mouth as needed. Reported on 07/04/2015  . [DISCONTINUED] Multiple Vitamin (THERA) TABS Take 1 tablet by mouth daily. Reported on 07/04/2015   No facility-administered encounter medications on file as of 07/04/2015.   Past medical history: History of mild hypertension. Presently she is not on any medications. She was diagnosed with left breast carcinoma in November 2008. She was treated with chemotherapy followed by left modified radical mastectomy in April 2009 and followed by radiation therapy and then she was on hormonal therapy. She was diagnosed with metastatic disease in March last year and now on maintenance therapy with letrozole and Palbociclib. She received radiation therapy radiation therapy for bone disease last year. She has been on OxyContin for 8-9 months for control of bone pain. Laparoscopic cholecystectomy for symptomatic cholelithiasis in 2009.  Allergies: No Known Allergies  Family history: Father died of heart problems at age 62 and mother died of MI also at age 11. She has one brother in good health.   Social history: She is married and her husband [name] is with her today. She  she moved to [place] from [place] 15 years ago. Their son age 47 is in good health. She does not smoke cigarettes and drinks alcohol infrequently which she has not been able to do lately because of burning.   Physical examination: Blood pressure 100/66, pulse 68, temperature 97.7 F (36.5 C), temperature source  Oral, resp. rate 18, height 5\' 5"  (1.651 m), weight 157 lb 11.2 oz (71.532 kg). Patient is alert and in no acute distress. Conjunctiva is pink. Sclera is nonicteric Oropharyngeal mucosa is normal. No neck masses or thyromegaly noted. Cardiac exam with regular rhythm normal S1 and S2. No murmur or gallop noted. Lungs are clear to auscultation. Abdomen is symmetrical. Bowel sounds are normal. On palpation abdomen is soft and nontender without organomegaly or masses. No LE edema or clubbing noted.  Labs/studies Results: Lab data from 06/21/2015  WBC 2.0, H&H 13.4 and 39.6 and platelet count 180K  Neutrophils 48%.  Ca 27.29 32.4 U/mL BUN 16, creatinine 0.67  Serum calcium 9.5.  Bilirubin 0.3, AP 58, AST 15.4, ALT 18, total protein 7.0 and albumin 3.90    Assessment:  #1. Patient's symptoms are consistent with reflux esophagitis. She has not responded to therapy but she really has not taken PPI and H2B long enough. It is also possible that she has narcotic induced gastroparesis but also need to rule out peptic ulcer disease and/or pyloric stenosis. #2. Metastatic breast carcinoma with bone mets. Pain control appears to be satisfactory.   Recommendations:  Anti-reflux measures reinforced. Pantoprazole 40 mg by mouth 30-60 minutes before evening meal. Diagnostic esophagogastroduodenoscopy under monitored anesthesia care in the future. Procedure risks reviewed with the patient and she was reassured that it is very low risk procedure and potential for complications remote.

## 2015-07-04 NOTE — Patient Instructions (Signed)
Anti-reflux measures as discussed. Take pantoprazole 30-60 minutes before evening meal daily. Esophagogastroduodenoscopy to be scheduled.

## 2015-07-05 ENCOUNTER — Other Ambulatory Visit (INDEPENDENT_AMBULATORY_CARE_PROVIDER_SITE_OTHER): Payer: Self-pay | Admitting: Internal Medicine

## 2015-07-05 DIAGNOSIS — K219 Gastro-esophageal reflux disease without esophagitis: Secondary | ICD-10-CM

## 2015-07-05 DIAGNOSIS — R1110 Vomiting, unspecified: Principal | ICD-10-CM

## 2015-07-05 DIAGNOSIS — IMO0001 Reserved for inherently not codable concepts without codable children: Secondary | ICD-10-CM

## 2015-07-08 NOTE — Patient Instructions (Signed)
Jill Johnston  07/08/2015     @PREFPERIOPPHARMACY @   Your procedure is scheduled on  07/12/2015  Report to [facility] at  1130  A.M.  Call this number if you have problems the morning of surgery:  [phone]   Remember:  Do not eat food or drink liquids after midnight.  Take these medicines the morning of surgery with A SIP OF WATER  Zofran, oxycodone, ibrance, protonix.   Do not wear jewelry, make-up or nail polish.  Do not wear lotions, powders, or perfumes.  You may wear deodorant.  Do not shave 48 hours prior to surgery.  Men may shave face and neck.  Do not bring valuables to the hospital.  [facility] is not responsible for any belongings or valuables.  Contacts, dentures or bridgework may not be worn into surgery.  Leave your suitcase in the car.  After surgery it may be brought to your room.  For patients admitted to the hospital, discharge time will be determined by your treatment team.  Patients discharged the day of surgery will not be allowed to drive home.   Name and phone number of your driver:   Family Special instructions:  Follow the diet instructions given to you by Dr Olevia Perches office.  Please read over the following fact sheets that you were given. Pain Booklet, Coughing and Deep Breathing, Surgical Site Infection Prevention, Anesthesia Post-op Instructions and Care and Recovery After Surgery      Esophagogastroduodenoscopy Esophagogastroduodenoscopy (EGD) is a procedure that is used to examine the lining of the esophagus, stomach, and first part of the small intestine (duodenum). A long, flexible, lighted tube with a camera attached (endoscope) is inserted down the throat to view these organs. This procedure is done to detect problems or abnormalities, such as inflammation, bleeding, ulcers, or growths, in order to treat them. The procedure lasts 5-20 minutes. It is usually an outpatient procedure, but it may need to be performed in a hospital  in emergency cases. LET [facility] CARE PROVIDER KNOW ABOUT:  Any allergies you have.  All medicines you are taking, including vitamins, herbs, eye drops, creams, and over-the-counter medicines.  Previous problems you or members of your family have had with the use of anesthetics.  Any blood disorders you have.  Previous surgeries you have had.  Medical conditions you have. RISKS AND COMPLICATIONS Generally, this is a safe procedure. However, problems can occur and include:  Infection.  Bleeding.  Tearing (perforation) of the esophagus, stomach, or duodenum.  Difficulty breathing or not being able to breathe.  Excessive sweating.  Spasms of the larynx.  Slowed heartbeat.  Low blood pressure. BEFORE THE PROCEDURE  Do not eat or drink anything after midnight on the night before the procedure or as directed by your health care provider.  Do not take your regular medicines before the procedure if your health care provider asks you not to. Ask your health care provider about changing or stopping those medicines.  If you wear dentures, be prepared to remove them before the procedure.  Arrange for someone to drive you home after the procedure. PROCEDURE  A numbing medicine (local anesthetic) may be sprayed in your throat for comfort and to stop you from gagging or coughing.  You will have an IV tube inserted in a vein in your hand or arm. You will receive medicines and fluids through this tube.  You will be given a medicine to relax you (sedative).  A pain reliever will be given through the IV tube.  A mouth guard may be placed in your mouth to protect your teeth and to keep you from biting on the endoscope.  You will be asked to lie on your left side.  The endoscope will be inserted down your throat and into your esophagus, stomach, and duodenum.  Air will be put through the endoscope to allow your health care provider to clearly view the lining of your  esophagus.  The lining of your esophagus, stomach, and duodenum will be examined. During the exam, your health care provider may:  Remove tissue to be examined under a microscope (biopsy) for inflammation, infection, or other medical problems.  Remove growths.  Remove objects (foreign bodies) that are stuck.  Treat any bleeding with medicines or other devices that stop tissues from bleeding (hot cautery, clipping devices).  Widen (dilate) or stretch narrowed areas of your esophagus and stomach.  The endoscope will be withdrawn. AFTER THE PROCEDURE  You will be taken to a recovery area for observation. Your blood pressure, heart rate, breathing rate, and blood oxygen level will be monitored often until the medicines you were given have worn off.  Do not eat or drink anything until the numbing medicine has worn off and your gag reflex has returned. You may choke.  Your health care provider should be able to discuss his or her findings with you. It will take longer to discuss the test results if any biopsies were taken.   This information is not intended to replace advice given to you by your health care provider. Make sure you discuss any questions you have with your health care provider.   Document Released: 10/16/2004 Document Revised: 07/06/2014 Document Reviewed: 05/18/2012 Elsevier Interactive Patient Education 2016 [place]. Esophagogastroduodenoscopy, Care After Refer to this sheet in the next few weeks. These instructions provide you with information about caring for yourself after your procedure. Your health care provider may also give you more specific instructions. Your treatment has been planned according to current medical practices, but problems sometimes occur. Call your health care provider if you have any problems or questions after your procedure. WHAT TO EXPECT AFTER THE PROCEDURE After your procedure, it is typical to feel:  Soreness in your throat.  Pain with  swallowing.  Sick to your stomach (nauseous).  Bloated.  Dizzy.  Fatigued. HOME CARE INSTRUCTIONS  Do not eat or drink anything until the numbing medicine (local anesthetic) has worn off and your gag reflex has returned. You will know that the local anesthetic has worn off when you can swallow comfortably.  Do not drive or operate machinery until directed by your health care provider.  Take medicines only as directed by your health care provider. SEEK MEDICAL CARE IF:   You cannot stop coughing.  You are not urinating at all or less than usual. SEEK IMMEDIATE MEDICAL CARE IF:  You have difficulty swallowing.  You cannot eat or drink.  You have worsening throat or chest pain.  You have dizziness or lightheadedness or you faint.  You have nausea or vomiting.  You have chills.  You have a fever.  You have severe abdominal pain.  You have black, tarry, or bloody stools.   This information is not intended to replace advice given to you by your health care provider. Make sure you discuss any questions you have with your health care provider.   Document Released: 06/01/2012 Document Revised: 07/06/2014 Document Reviewed: 06/01/2012 Elsevier Interactive  Patient Education 2016 [employer] PATIENT INSTRUCTIONS POST-ANESTHESIA  IMMEDIATELY FOLLOWING SURGERY:  Do not drive or operate machinery for the first twenty four hours after surgery.  Do not make any important decisions for twenty four hours after surgery or while taking narcotic pain medications or sedatives.  If you develop intractable nausea and vomiting or a severe headache please notify your doctor immediately.  FOLLOW-UP:  Please make an appointment with your surgeon as instructed. You do not need to follow up with anesthesia unless specifically instructed to do so.  WOUND CARE INSTRUCTIONS (if applicable):  Keep a dry clean dressing on the anesthesia/puncture wound site if there is drainage.  Once the wound has  quit draining you may leave it open to air.  Generally you should leave the bandage intact for twenty four hours unless there is drainage.  If the epidural site drains for more than 36-48 hours please call the anesthesia department.  QUESTIONS?:  Please feel free to call your physician or the hospital operator if you have any questions, and they will be happy to assist you.

## 2015-07-10 ENCOUNTER — Encounter (HOSPITAL_COMMUNITY): Payer: Self-pay

## 2015-07-10 ENCOUNTER — Encounter (HOSPITAL_COMMUNITY)
Admission: RE | Admit: 2015-07-10 | Discharge: 2015-07-10 | Disposition: A | Discharge status: Home / Self Care | Payer: BLUE CROSS/BLUE SHIELD | Source: Ambulatory Visit | Attending: Internal Medicine | Admitting: Internal Medicine

## 2015-07-10 VITALS — BP 133/80 | HR 81 | Temp 97.8°F | Resp 18 | Ht 65.0 in | Wt 158.0 lb

## 2015-07-10 DIAGNOSIS — K449 Diaphragmatic hernia without obstruction or gangrene: Secondary | ICD-10-CM | POA: Diagnosis not present

## 2015-07-10 DIAGNOSIS — IMO0001 Reserved for inherently not codable concepts without codable children: Secondary | ICD-10-CM

## 2015-07-10 DIAGNOSIS — F419 Anxiety disorder, unspecified: Secondary | ICD-10-CM | POA: Diagnosis not present

## 2015-07-10 DIAGNOSIS — Z853 Personal history of malignant neoplasm of breast: Secondary | ICD-10-CM | POA: Diagnosis not present

## 2015-07-10 DIAGNOSIS — R1110 Vomiting, unspecified: Principal | ICD-10-CM

## 2015-07-10 DIAGNOSIS — Z79899 Other long term (current) drug therapy: Secondary | ICD-10-CM | POA: Diagnosis not present

## 2015-07-10 DIAGNOSIS — K2210 Ulcer of esophagus without bleeding: Secondary | ICD-10-CM | POA: Diagnosis not present

## 2015-07-10 DIAGNOSIS — C7951 Secondary malignant neoplasm of bone: Secondary | ICD-10-CM | POA: Diagnosis not present

## 2015-07-10 DIAGNOSIS — K210 Gastro-esophageal reflux disease with esophagitis: Secondary | ICD-10-CM | POA: Diagnosis not present

## 2015-07-10 DIAGNOSIS — Z01812 Encounter for preprocedural laboratory examination: Secondary | ICD-10-CM | POA: Diagnosis not present

## 2015-07-10 DIAGNOSIS — K219 Gastro-esophageal reflux disease without esophagitis: Secondary | ICD-10-CM

## 2015-07-10 HISTORY — DX: Gastro-esophageal reflux disease without esophagitis: K21.9

## 2015-07-10 NOTE — Pre-Procedure Instructions (Signed)
Patient became upset during PAT as she found out she was going to need blood work. "Ive never had to do this before when I have had this done". Explained to patient that she was being done with stronger medication and because of the way Dr Laural Golden has her scheduled she has to have blood work. "No one ever told me any of this. Why am I being done like this? He never told me this. I had blood work by my oncologist two weeks ago". I explained that we could use that blood work. I asked her for her Dr's number and name and told her I would call him for her. "I will call him myself, he is my friend and he will help me".She insisted that PAT be stopped until she gets in touch with her doctor.

## 2015-07-10 NOTE — Pre-Procedure Instructions (Signed)
Patient given information to sign up for my chart at home. 

## 2015-07-12 ENCOUNTER — Ambulatory Visit (HOSPITAL_COMMUNITY): Payer: BLUE CROSS/BLUE SHIELD | Admitting: Anesthesiology

## 2015-07-12 ENCOUNTER — Encounter (HOSPITAL_COMMUNITY)
Admission: RE | Disposition: A | Discharge status: Home / Self Care | Payer: Self-pay | Source: Ambulatory Visit | Attending: Internal Medicine

## 2015-07-12 ENCOUNTER — Encounter (HOSPITAL_COMMUNITY): Payer: Self-pay | Admitting: *Deleted

## 2015-07-12 ENCOUNTER — Ambulatory Visit (HOSPITAL_COMMUNITY)
Admission: RE | Admit: 2015-07-12 | Discharge: 2015-07-12 | Disposition: A | Discharge status: Home / Self Care | Payer: BLUE CROSS/BLUE SHIELD | Source: Ambulatory Visit | Attending: Internal Medicine | Admitting: Internal Medicine

## 2015-07-12 DIAGNOSIS — K449 Diaphragmatic hernia without obstruction or gangrene: Secondary | ICD-10-CM | POA: Insufficient documentation

## 2015-07-12 DIAGNOSIS — Z853 Personal history of malignant neoplasm of breast: Secondary | ICD-10-CM | POA: Insufficient documentation

## 2015-07-12 DIAGNOSIS — K208 Other esophagitis: Secondary | ICD-10-CM | POA: Diagnosis not present

## 2015-07-12 DIAGNOSIS — K210 Gastro-esophageal reflux disease with esophagitis: Secondary | ICD-10-CM | POA: Insufficient documentation

## 2015-07-12 DIAGNOSIS — C7951 Secondary malignant neoplasm of bone: Secondary | ICD-10-CM

## 2015-07-12 DIAGNOSIS — Z01812 Encounter for preprocedural laboratory examination: Secondary | ICD-10-CM | POA: Insufficient documentation

## 2015-07-12 DIAGNOSIS — Z79899 Other long term (current) drug therapy: Secondary | ICD-10-CM | POA: Insufficient documentation

## 2015-07-12 DIAGNOSIS — K219 Gastro-esophageal reflux disease without esophagitis: Secondary | ICD-10-CM | POA: Diagnosis not present

## 2015-07-12 DIAGNOSIS — R1110 Vomiting, unspecified: Secondary | ICD-10-CM | POA: Diagnosis not present

## 2015-07-12 DIAGNOSIS — F419 Anxiety disorder, unspecified: Secondary | ICD-10-CM | POA: Insufficient documentation

## 2015-07-12 DIAGNOSIS — K2210 Ulcer of esophagus without bleeding: Secondary | ICD-10-CM | POA: Insufficient documentation

## 2015-07-12 DIAGNOSIS — C50912 Malignant neoplasm of unspecified site of left female breast: Secondary | ICD-10-CM

## 2015-07-12 DIAGNOSIS — IMO0001 Reserved for inherently not codable concepts without codable children: Secondary | ICD-10-CM

## 2015-07-12 DIAGNOSIS — R112 Nausea with vomiting, unspecified: Secondary | ICD-10-CM | POA: Diagnosis not present

## 2015-07-12 HISTORY — PX: ESOPHAGOGASTRODUODENOSCOPY (EGD) WITH PROPOFOL: SHX5813

## 2015-07-12 SURGERY — ESOPHAGOGASTRODUODENOSCOPY (EGD) WITH PROPOFOL
Anesthesia: Monitor Anesthesia Care

## 2015-07-12 MED ORDER — MIDAZOLAM HCL 5 MG/5ML IJ SOLN
INTRAMUSCULAR | Status: DC | PRN
  Administered 2015-07-12: 2 mg via INTRAVENOUS

## 2015-07-12 MED ORDER — MIDAZOLAM HCL 2 MG/2ML IJ SOLN
INTRAMUSCULAR | Status: AC
  Filled 2015-07-12: qty 2

## 2015-07-12 MED ORDER — ONDANSETRON HCL 4 MG/2ML IJ SOLN: 4.0000 mg | Freq: Once | INTRAMUSCULAR | Status: DC | PRN

## 2015-07-12 MED ORDER — FENTANYL CITRATE (PF) 100 MCG/2ML IJ SOLN: 25.0000 ug | INTRAMUSCULAR | Status: DC | PRN

## 2015-07-12 MED ORDER — MIDAZOLAM HCL 2 MG/2ML IJ SOLN
1.0000 mg | INTRAMUSCULAR | Status: DC | PRN
  Administered 2015-07-12: 2 mg via INTRAVENOUS
  Filled 2015-07-12 (×2): qty 2

## 2015-07-12 MED ORDER — PROPOFOL 500 MG/50ML IV EMUL
INTRAVENOUS | Status: DC | PRN
  Administered 2015-07-12: 125 ug/kg/min via INTRAVENOUS

## 2015-07-12 MED ORDER — PROPOFOL 10 MG/ML IV BOLUS
INTRAVENOUS | Status: AC
  Filled 2015-07-12: qty 20

## 2015-07-12 MED ORDER — BUTAMBEN-TETRACAINE-BENZOCAINE 2-2-14 % EX AERO
2.0000 | INHALATION_SPRAY | Freq: Once | CUTANEOUS | Status: AC
  Administered 2015-07-12: 2 via TOPICAL

## 2015-07-12 MED ORDER — LACTATED RINGERS IV SOLN
INTRAVENOUS | Status: DC
  Administered 2015-07-12: 12:00:00 via INTRAVENOUS

## 2015-07-12 NOTE — Anesthesia Postprocedure Evaluation (Signed)
Anesthesia Post Note  Patient: Jill Johnston  Procedure(s) Performed: Procedure(s) (LRB): ESOPHAGOGASTRODUODENOSCOPY (EGD) WITH PROPOFOL (N/A)  Patient location during evaluation: PACU Anesthesia Type: MAC Level of consciousness: awake and alert and patient cooperative Pain management: pain level controlled Vital Signs Assessment: post-procedure vital signs reviewed and stable Respiratory status: patient connected to nasal cannula oxygen, spontaneous breathing and nonlabored ventilation Cardiovascular status: blood pressure returned to baseline Postop Assessment: no signs of nausea or vomiting Anesthetic complications: no    Last Vitals:  Filed Vitals:   07/12/15 1240 07/12/15 1315  BP: 125/90 86/60  Temp:  36.3 C  Resp: 16 15    Last Pain:  Filed Vitals:   07/12/15 1334  PainSc: 3                  Dhyan Noah J

## 2015-07-12 NOTE — H&P (Signed)
Jill Johnston is an 50 y.o. female.   Chief Complaint:  Patient is here for EGD. HPI:  Patient is 50 year old Caucasian female who has history of metastatic breast carcinoma who presented with the 4 episodes of nocturnal regurgitation leading to aspiration and causing excruciating burning in her throat and esophagus. She has also noted postprandial retrosternal burning during the daytime. She has not been able to drink liquids that she was able to without any difficulty. She also has not been able to drink alcohol because of severe burning. She drinks alcohol occasionally though. She denies dysphagia abdominal pain or melena. She was seen in the  Office last week and begun on PPI. She feels better.  She is undergoing diagnostic EGD.  Past Medical History  Diagnosis Date  . Anxiety   . 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 GERD (gastroesophageal reflux disease)     Past Surgical History  Procedure Laterality Date  . Mastectomy  April 2009    Left breast with lymph node resection  . Cholecystectomy      Family History  Problem Relation Age of Onset  . Heart attack Mother     Died age 74  . Heart disease Father     Died age 80  . Brain cancer Father    Social History:  reports that she has never smoked. She has never used smokeless tobacco. She reports that she does not drink alcohol or use illicit drugs.  Allergies: No Known Allergies  Medications Prior to Admission  Medication Sig Dispense Refill  . Calcium Carb-Cholecalciferol (CALCIUM + D3) 600-200 MG-UNIT TABS Take 1 tablet by mouth daily.     Marland Kitchen letrozole (FEMARA) 2.5 MG tablet Reported on 07/04/2015  9  . ondansetron (ZOFRAN ODT) 8 MG disintegrating tablet Take 8 mg by mouth as needed. Reported on 07/04/2015    . OxyCODONE HCl ER (OXYCONTIN) 30 MG T12A Take 30 mg by mouth 2 (two) times daily.     . palbociclib (IBRANCE) 100 MG capsule Take 100 mg by  mouth daily.     . pantoprazole (PROTONIX) 40 MG tablet Take 1 tablet (40 mg total) by mouth daily before supper. 30 tablet 5  . polyethylene glycol (MIRALAX / GLYCOLAX) packet Take 17 grams by mouth every day.    . nitroGLYCERIN (NITROSTAT) 0.4 MG SL tablet Place 0.4 mg under the tongue every 5 (five) minutes as needed.       No results found for this or any previous visit (from the past 48 hour(s)). No results found.  ROS  Blood pressure 125/90, temperature 97.6 F (36.4 C), temperature source Oral, resp. rate 16, SpO2 99 %. Physical Exam  Constitutional: She appears well-developed and well-nourished.  HENT:  Mouth/Throat: Oropharynx is clear and moist.  Eyes: Conjunctivae are normal. No scleral icterus.  Neck: No thyromegaly present.  Cardiovascular: Normal rate, regular rhythm and normal heart sounds.   No murmur heard. Respiratory: Effort normal and breath sounds normal.  GI: Soft. She exhibits no distension and no mass. There is no tenderness.  Musculoskeletal: She exhibits no edema.  Lymphadenopathy:    She has no cervical adenopathy.  Neurological: She is alert.  Skin: Skin is warm and dry.     Assessment/Plan  Gastric esophageal reflux disease of recent onset.  She may have gastroparesis secondary to pain medication.  Diagnostic EGD.  REHMAN,NAJEEB U 07/12/2015, 1:01 PM

## 2015-07-12 NOTE — Transfer of Care (Signed)
Immediate Anesthesia Transfer of Care Note  Patient: Jill Johnston  Procedure(s) Performed: Procedure(s) with comments: ESOPHAGOGASTRODUODENOSCOPY (EGD) WITH PROPOFOL (N/A) - 1:00  Patient Location: PACU  Anesthesia Type:MAC  Level of Consciousness: awake, alert  and patient cooperative  Airway & Oxygen Therapy: Patient Spontanous Breathing and Patient connected to face mask oxygen  Post-op Assessment: Report given to RN, Post -op Vital signs reviewed and stable and Patient moving all extremities  Post vital signs: Reviewed and stable  Last Vitals:  Filed Vitals:   07/12/15 1235 07/12/15 1240  BP: 125/90 125/90  Temp:    Resp: 15 16    Complications: No apparent anesthesia complications

## 2015-07-12 NOTE — Op Note (Signed)
EGD PROCEDURE REPORT  PATIENT:  Jill Johnston  MR#:  [id number] Birthdate:  1965-11-20, 50 y.o., female Endoscopist:  Dr. Rogene Houston, MD Referred By:  Dr. Dimas Aguas, MD Procedure Date: 07/12/2015  Procedure:   EGD  Indications:   Patient is 51 year old Caucasian female with history of metastatic breast carcinoma who  Presents with intermittent heartburn regurgitation and she's had 4 episodes of nocturnal regurgitation with aspiration. She was begun on pantoprazole last week and she feels better. She is undergoing diagnostic EGD.            Informed Consent:  The risks, benefits, alternatives & imponderables which include, but are not limited to, bleeding, infection, perforation, drug reaction and potential missed lesion have been reviewed.  The potential for biopsy, lesion removal, esophageal dilation, etc. have also been discussed.  Questions have been answered.  All parties agreeable.  Please see history & physical in medical record for more information.  Medications:  Cetacaine spray topically for oropharyngeal anesthesia  Monitored anesthesia care.  Please see anesthesia records for details.  Description of procedure:  The endoscope was introduced through the mouth and advanced to the second portion of the duodenum without difficulty or limitations. The mucosal surfaces were surveyed very carefully during advancement of the scope and upon withdrawal.  Findings:  Esophagus:  Mucosa of the proximal and middle third was normal. Few scattered erosions noted involving distal 2 cm of esophageal mucosa. GEJ:  32 cm Hiatus:  35 cm Stomach:   Stomach was empty and distended very well with insufflation. Folds in the proximal stomach were normal. Examination of mucosa at gastric body, antrum, pyloric channel, angularis fundus and cardia revealed no abnormality. Duodenum:   Normal bulbar and post bulbar mucosa.  Therapeutic/Diagnostic Maneuvers Performed:   None  Complications:   None  EBL: None  Impression:  Erosive reflux esophagitis.  Small sliding hiatal hernia.  No evidence of peptic ulcer or pyloric stenosis.  Recommendations:   Standard instructions given.  Patient will continue anti-reflex measures an pantoprazole as before.  She will keep symptom diary and call with progress report in few weeks.  If she has another episode of nocturnal regurgitation will consider adding promotility agent at bedtime.  Tylor Courtwright U  07/12/2015  1:26 PM  CC: Dr. Audree Camel, MD & Dr. Rayne Du ref. provider found

## 2015-07-12 NOTE — Discharge Instructions (Signed)
Resume usual medications and diet.  Anti-reflux measures as before.  No driving for 24 hours.  Keep symptom diary as to frequency of heartburn and regurgitation and call with progress report in 3-4 weeks or earlier if needed.    Esophagogastroduodenoscopy, Care After Refer to this sheet in the next few weeks. These instructions provide you with information about caring for yourself after your procedure. Your health care provider may also give you more specific instructions. Your treatment has been planned according to current medical practices, but problems sometimes occur. Call your health care provider if you have any problems or questions after your procedure. WHAT TO EXPECT AFTER THE PROCEDURE After your procedure, it is typical to feel:  Soreness in your throat.  Pain with swallowing.  Sick to your stomach (nauseous).  Bloated.  Dizzy.  Fatigued. HOME CARE INSTRUCTIONS  Do not eat or drink anything until the numbing medicine (local anesthetic) has worn off and your gag reflex has returned. You will know that the local anesthetic has worn off when you can swallow comfortably.  Do not drive or operate machinery until directed by your health care provider.  Take medicines only as directed by your health care provider. SEEK MEDICAL CARE IF:   You cannot stop coughing.  You are not urinating at all or less than usual. SEEK IMMEDIATE MEDICAL CARE IF:  You have difficulty swallowing.  You cannot eat or drink.  You have worsening throat or chest pain.  You have dizziness or lightheadedness or you faint.  You have nausea or vomiting.  You have chills.  You have a fever.  You have severe abdominal pain.  You have black, tarry, or bloody stools.   This information is not intended to replace advice given to you by your health care provider. Make sure you discuss any questions you have with your health care provider.   Document Released: 06/01/2012 Document Revised:  07/06/2014 Document Reviewed: 06/01/2012 Elsevier Interactive Patient Education 2016 [employer]   Hiatal Hernia A hiatal hernia occurs when part of your stomach slides above the muscle that separates your abdomen from your chest (diaphragm). You can be born with a hiatal hernia (congenital), or it may develop over time. In almost all cases of hiatal hernia, only the top part of the stomach pushes through.  Many people have a hiatal hernia with no symptoms. The larger the hernia, the more likely that you will have symptoms. In some cases, a hiatal hernia allows stomach acid to flow back into the tube that carries food from your mouth to your stomach (esophagus). This may cause heartburn symptoms. Severe heartburn symptoms may mean you have developed a condition called gastroesophageal reflux disease (GERD).  CAUSES  Hiatal hernias are caused by a weakness in the opening (hiatus) where your esophagus passes through your diaphragm to attach to the upper part of your stomach. You may be born with a weakness in your hiatus, or a weakness can develop. RISK FACTORS Older age is a major risk factor for a hiatal hernia. Anything that increases pressure on your diaphragm can also increase your risk of a hiatal hernia. This includes:  Pregnancy.  Excess weight.  Frequent constipation. SIGNS AND SYMPTOMS  People with a hiatal hernia often have no symptoms. If symptoms develop, they are almost always caused by GERD. They may include:  Heartburn.  Belching.  Indigestion.  Trouble swallowing.  Coughing or wheezing.  Sore throat.  Hoarseness.  Chest pain. DIAGNOSIS  A hiatal hernia is  sometimes found during an exam for another problem. Your health care provider may suspect a hiatal hernia if you have symptoms of GERD. Tests may be done to diagnose GERD. These may include:  X-rays of your stomach or chest.  An upper gastrointestinal (GI) series. This is an X-ray exam of your GI tract  involving the use of a chalky liquid that you swallow. The liquid shows up clearly on the X-ray.  Endoscopy. This is a procedure to look into your stomach using a thin, flexible tube that has a tiny camera and light on the end of it. TREATMENT  If you have no symptoms, you may not need treatment. If you have symptoms, treatment may include:  Dietary and lifestyle changes to help reduce GERD symptoms.  Medicines. These may include:  Over-the-counter antacids.  Medicines that make your stomach empty more quickly.  Medicines that block the production of stomach acid (H2 blockers).  Stronger medicines to reduce stomach acid (proton pump inhibitors).  You may need surgery to repair the hernia if other treatments are not helping. HOME CARE INSTRUCTIONS   Take all medicines as directed by your health care provider.  Quit smoking, if you smoke.  Try to achieve and maintain a healthy body weight.  Eat frequent small meals instead of three large meals a day. This keeps your stomach from getting too full.  Eat slowly.  Do not lie down right after eating.  Do noteat 1-2 hours before bed.   Do not drink beverages with caffeine. These include cola, coffee, cocoa, and tea.  Do not drink alcohol.  Avoid foods that can make symptoms of GERD worse. These may include:  Fatty foods.  Citrus fruits.  Other foods and drinks that contain acid.  Avoid putting pressure on your belly. Anything that puts pressure on your belly increases the amount of acid that may be pushed up into your esophagus.   Avoid bending over, especially after eating.  Raise the head of your bed by putting blocks under the legs. This keeps your head and esophagus higher than your stomach.  Do not wear tight clothing around your chest or stomach.  Try not to strain when having a bowel movement, when urinating, or when lifting heavy objects. SEEK MEDICAL CARE IF:  Your symptoms are not controlled with  medicines or lifestyle changes.  You are having trouble swallowing.  You have coughing or wheezing that will not go away. SEEK IMMEDIATE MEDICAL CARE IF:  Your pain is getting worse.  Your pain spreads to your arms, neck, jaw, teeth, or back.  You have shortness of breath.  You sweat for no reason.  You feel sick to your stomach (nauseous) or vomit.  You vomit blood.  You have bright red blood in your stools.  You have black, tarry stools.    This information is not intended to replace advice given to you by your health care provider. Make sure you discuss any questions you have with your health care provider.   Document Released: 09/05/2003 Document Revised: 07/06/2014 Document Reviewed: 06/02/2013 Elsevier Interactive Patient Education [employer].

## 2015-07-12 NOTE — Anesthesia Preprocedure Evaluation (Addendum)
Anesthesia Evaluation  Patient identified by MRN, date of birth, ID band Patient awake    Reviewed: Allergy & Precautions, NPO status , Patient's Chart, lab work & pertinent test results  Airway Mallampati: III  TM Distance: >3 FB     Dental  (+) Teeth Intact, Dental Advisory Given   Pulmonary    Pulmonary exam normal        Cardiovascular hypertension, Normal cardiovascular exam     Neuro/Psych Anxiety    GI/Hepatic GERD  Medicated and Poorly Controlled,  Endo/Other    Renal/GU      Musculoskeletal   Abdominal Normal abdominal exam  (+)   Peds  Hematology   Anesthesia Other Findings   Reproductive/Obstetrics                            Anesthesia Physical Anesthesia Plan  ASA: II  Anesthesia Plan: MAC   Post-op Pain Management:    Induction:   Airway Management Planned: Nasal Cannula  Additional Equipment:   Intra-op Plan:   Post-operative Plan:   Informed Consent: I have reviewed the patients History and Physical, chart, labs and discussed the procedure including the risks, benefits and alternatives for the proposed anesthesia with the patient or authorized representative who has indicated his/her understanding and acceptance.   Dental advisory given  Plan Discussed with: CRNA  Anesthesia Plan Comments:         Anesthesia Quick Evaluation

## 2015-07-16 ENCOUNTER — Encounter (HOSPITAL_COMMUNITY): Payer: Self-pay | Admitting: Internal Medicine

## 2015-07-26 ENCOUNTER — Encounter (INDEPENDENT_AMBULATORY_CARE_PROVIDER_SITE_OTHER): Payer: Self-pay

## 2015-08-22 ENCOUNTER — Other Ambulatory Visit: Payer: Self-pay | Admitting: Internal Medicine

## 2015-08-22 DIAGNOSIS — Z1231 Encounter for screening mammogram for malignant neoplasm of breast: Secondary | ICD-10-CM

## 2015-09-17 ENCOUNTER — Ambulatory Visit
Admission: RE | Admit: 2015-09-17 | Discharge: 2015-09-17 | Disposition: A | Discharge status: Home / Self Care | Payer: BLUE CROSS/BLUE SHIELD | Source: Ambulatory Visit | Attending: Internal Medicine | Admitting: Internal Medicine

## 2015-09-17 DIAGNOSIS — Z1231 Encounter for screening mammogram for malignant neoplasm of breast: Secondary | ICD-10-CM

## 2015-09-20 ENCOUNTER — Other Ambulatory Visit (HOSPITAL_COMMUNITY): Payer: Self-pay | Admitting: Internal Medicine

## 2015-09-20 DIAGNOSIS — C7951 Secondary malignant neoplasm of bone: Secondary | ICD-10-CM

## 2015-10-15 ENCOUNTER — Ambulatory Visit (HOSPITAL_COMMUNITY): Payer: BLUE CROSS/BLUE SHIELD

## 2015-10-23 ENCOUNTER — Encounter (HOSPITAL_COMMUNITY): Payer: BLUE CROSS/BLUE SHIELD

## 2015-12-05 ENCOUNTER — Telehealth: Payer: Self-pay | Admitting: Oncology

## 2015-12-05 NOTE — Telephone Encounter (Signed)
Spoke with patient re new patient appointment with LL 12/27/2015 @ 8:30 am for LL and 8 am for lab. Patient demographic and insurance information confirmed. Date/time per LL - transfer from Dr. Jacquiline Doe. Patient records to [facility] 12/05/15.

## 2015-12-25 ENCOUNTER — Other Ambulatory Visit: Payer: Self-pay | Admitting: Oncology

## 2015-12-25 DIAGNOSIS — C7951 Secondary malignant neoplasm of bone: Secondary | ICD-10-CM

## 2015-12-27 ENCOUNTER — Encounter: Payer: Self-pay | Admitting: Oncology

## 2015-12-27 ENCOUNTER — Telehealth: Payer: Self-pay | Admitting: Oncology

## 2015-12-27 ENCOUNTER — Ambulatory Visit (HOSPITAL_BASED_OUTPATIENT_CLINIC_OR_DEPARTMENT_OTHER): Payer: BLUE CROSS/BLUE SHIELD | Admitting: Oncology

## 2015-12-27 ENCOUNTER — Other Ambulatory Visit (HOSPITAL_BASED_OUTPATIENT_CLINIC_OR_DEPARTMENT_OTHER): Payer: BLUE CROSS/BLUE SHIELD

## 2015-12-27 VITALS — BP 125/91 | HR 72 | Temp 97.7°F | Resp 18 | Ht 65.0 in | Wt 162.4 lb

## 2015-12-27 DIAGNOSIS — G893 Neoplasm related pain (acute) (chronic): Secondary | ICD-10-CM

## 2015-12-27 DIAGNOSIS — C7800 Secondary malignant neoplasm of unspecified lung: Secondary | ICD-10-CM | POA: Diagnosis not present

## 2015-12-27 DIAGNOSIS — C50912 Malignant neoplasm of unspecified site of left female breast: Secondary | ICD-10-CM

## 2015-12-27 DIAGNOSIS — C50919 Malignant neoplasm of unspecified site of unspecified female breast: Secondary | ICD-10-CM

## 2015-12-27 DIAGNOSIS — C7951 Secondary malignant neoplasm of bone: Secondary | ICD-10-CM

## 2015-12-27 DIAGNOSIS — K2100 Gastro-esophageal reflux disease with esophagitis, without bleeding: Secondary | ICD-10-CM

## 2015-12-27 DIAGNOSIS — K219 Gastro-esophageal reflux disease without esophagitis: Secondary | ICD-10-CM

## 2015-12-27 DIAGNOSIS — I890 Lymphedema, not elsewhere classified: Secondary | ICD-10-CM

## 2015-12-27 DIAGNOSIS — Z9049 Acquired absence of other specified parts of digestive tract: Secondary | ICD-10-CM

## 2015-12-27 LAB — CBC WITH DIFFERENTIAL/PLATELET
BASO%: 2 % (ref 0.0–2.0)
BASOS ABS: 0 10*3/uL (ref 0.0–0.1)
EOS%: 2.2 % (ref 0.0–7.0)
Eosinophils Absolute: 0 10*3/uL (ref 0.0–0.5)
HEMATOCRIT: 37.4 % (ref 34.8–46.6)
HEMOGLOBIN: 12.5 g/dL (ref 11.6–15.9)
LYMPH%: 36.4 % (ref 14.0–49.7)
MCH: 34.7 pg — AB (ref 25.1–34.0)
MCHC: 33.4 g/dL (ref 31.5–36.0)
MCV: 103.8 fL — AB (ref 79.5–101.0)
MONO#: 0.2 10*3/uL (ref 0.1–0.9)
MONO%: 8.5 % (ref 0.0–14.0)
NEUT#: 0.9 10*3/uL — ABNORMAL LOW (ref 1.5–6.5)
NEUT%: 50.9 % (ref 38.4–76.8)
PLATELETS: 246 10*3/uL (ref 145–400)
RBC: 3.6 10*6/uL — ABNORMAL LOW (ref 3.70–5.45)
RDW: 14.3 % (ref 11.2–14.5)
WBC: 1.8 10*3/uL — AB (ref 3.9–10.3)
lymph#: 0.6 10*3/uL — ABNORMAL LOW (ref 0.9–3.3)

## 2015-12-27 LAB — COMPREHENSIVE METABOLIC PANEL
ALBUMIN: 3.6 g/dL (ref 3.5–5.0)
ALK PHOS: 95 U/L (ref 40–150)
ALT: 41 U/L (ref 0–55)
ANION GAP: 9 meq/L (ref 3–11)
AST: 30 U/L (ref 5–34)
BUN: 20 mg/dL (ref 7.0–26.0)
CALCIUM: 9.6 mg/dL (ref 8.4–10.4)
CO2: 26 mEq/L (ref 22–29)
Chloride: 105 mEq/L (ref 98–109)
Creatinine: 1 mg/dL (ref 0.6–1.1)
EGFR: 67 mL/min/{1.73_m2} — AB (ref >90)
Glucose: 122 mg/dl (ref 70–140)
POTASSIUM: 4.4 meq/L (ref 3.5–5.1)
Sodium: 140 mEq/L (ref 136–145)
Total Bilirubin: <0.3 mg/dL (ref 0.20–1.20)
Total Protein: 7.1 g/dL (ref 6.4–8.3)

## 2015-12-27 NOTE — Telephone Encounter (Signed)
Pt declined appt with GI doctor. Pt did not want to wait 3 months for a fu appt due to [place] available appt. Pt stated she will find another GI provider

## 2015-12-27 NOTE — Progress Notes (Signed)
Jill Johnston PATIENT EVALUATION   Name: Jill Johnston Date: December 27, 2015  MRN: [id number] DOB: April 18, 1966  REFERRING PHYSICIAN: [name], MD CC: Jill Johnston, Jill Johnston, Jill Johnston, Jill Johnston) , gyn in [place] No PCP  REASON FOR REFERRAL:  Metastatic breast cancer to bone and lung   HISTORY OF PRESENT ILLNESS:Jill Johnston is a 50 y.o. female who is seen in consultation, together with husband, at the request of Jill Johnston, for transfer of medical oncology care to [place] Johnston. 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 The malignancy was ER + 100%, PR 1%, HER 2 IHC/FISH negative, Ki67 52%, BRCA 1/2 negative, initial CA 2729 WNL. She had neoadjuvant AC x4 taxol x12 dose dense from 06-10-2007 thru 09-28-2007. 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  She had radiation to chest wall, supraclavicular region and left axilla 50.4 Gy with boost to mastectomy scar to 60.4 Gy, by Jill Jill Johnston from 12-15-2007 thru 01-31-2008.  She was on tamoxifen from 02-20-2008 thru 09-25-2014. She developed pain in ribs 08-2014, with CA 2729 52 and PET CT and MRI with diffuse bone metastatic disease, no cord compression, small lung lesions and intrathoracic adenopathy. 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 She began Denosumab 120 mcg monthly starting 4-7-201, and letrozole + palbociclib beginning4-12-2014. She was seen in consultation by Jill Jill Johnston [phone].  Palbociclib dose decreased to 100 mg daily x 21 q 28 days due to neutropenia. CBC on 10-25-15: WBC 2.1, Hgb 13, plt 186, ANC  0.9 Most recent PET was 09-25-2014 in [facility]; most recent imaging was CT CAP at [facility] 10-25-15 with no clear pulmonary involvement, stable 2 cm lesion at dome of liver and 11 mm left hepatic lobe lesion. She had right tomo mammogram at [facility] 09-17-15, with heterogeneously dense breast tissue but no other mammographic findings of concern.  CA 2729 has recently shown some increase, from 38 in 08-2015 to 49 in April 2017 and 65 in May 2017. She had evaluation for vaginal bleeding "from polyp", follows yearly with gynecologist in [place], up to date.   REVIEW OF SYSTEMS: Per patient, chemo was very hard for her. Occasional left sided HA, no other neurologic symptoms, no peripheral neuropathy from previous chemo. Reading glasses. No sinus/ allergy symptoms. No problems hearing. No thyroid disease. No SOB, no cough, no wheezing. Some soreness in right shoulder with positioning when sleeps. Some lymphedema LUE stable. No swelling LE. No other bleeding. GERD less severe but still bothersome especially positional with sleeping. Stiffness generalized in AMs, moves very slowly with stiffness early in day but more easily mobile later. Bowels move daily with laxatives ~ q 3 days. Bladder ok. Hot flashes better now. Some difficulty playing tennis due to stiffness and soreness. Recent dental cleaning or other, timing coordinatied out from denosumab.  Remainder of full 10 point review of systems negative.   ALLERGIES: Review of patient's allergies indicates no known allergies.  PAST MEDICAL/ SURGICAL HISTORY:    EGD 07-12-15 [facility], done because of severe nocturnal regurgitation, few erosions in distal esophagus Laparoscopic cholecystectomy ~  2010 by surgeon in [place] by surgeon in [place] for chemo, subsequently removed. No transfusions No history blood clots  CURRENT MEDICATIONS: reviewed as listed now in EMR. Oxycontin 30 mg q 12 hrs. Has oxycodone available, but does not use. Uses aleve.  Letrozole daily. palbociclib dose decreased to 100 mg for neutropenia, x 21 days every 28 days.   PHARMACY   SOCIAL HISTORY:  [place], lived in [place]. In [place] x 15 years. Married, 1 son age 31., lives in [place]. Biochemist, clinical. Never smoker, no ETOH. She plans trip to [place] July 31 to Aug 25.   FAMILY HISTORY:  Mother died MI age 49 Father cardiac disease Brother no known medical problems Son healthy          PHYSICAL EXAM:  Johnston is 5' 5"  (1.651 m) and weight is 162 lb 6.4 oz (73.664 kg). Her oral temperature is 97.7 F (36.5 C). Her blood pressure is 125/91 and her pulse is 72. Her respiration is 18 and oxygen saturation is 96%.  Alert, pleasant, cooperative lady, talkative, good historian, husband very supportive. Anxious but does not appear in discomfort. Easily mobile. Respirations not labored.  HEENT: normal hair pattern. PERRL, not icteric. Oral mucosa moist and clear, posterior pharynx also. Neck supple without JVD or thyroid mass.  RESPIRATORY: lungs clear to A and P  CARDIAC/ VASCULAR: heart RRR clear heart sounds. Peripheral pulses intact and symmetrical  ABDOMEN: soft, not tender, not distended, no appreciable HSM, normally active BS  LYMPH NODES: no cervical , supraclavicular, axillary or inguinal adenopathy  BREASTS: left mastectomy scar well healed without evidence of local recurrence. Right breast without dominant mass, skin or nipple findings.   NEUROLOGIC: speech fluent and appropriate. CN, motor, sensory, cerebellar nonfocal. PSYCH appropriate mood and affect  SKIN: without rash, ecchymosis, petechiae  MUSCULOSKELETAL: slight lymphedema LUE without cellulitis. Back not tender to palpation. LE no edema, cords, tenderness  No PAC now   LABORATORY DATA:  Results for orders placed or performed in visit on 12/27/15 (from the past 48 hour(s))  CBC with Differential     Status: Abnormal   Collection Time: 12/27/15  8:30 AM  Result Value  Ref Range   WBC 1.8 (L) 3.9 - 10.3 10e3/uL   NEUT# 0.9 (L) 1.5 - 6.5 10e3/uL   HGB 12.5 11.6 - 15.9 g/dL   HCT 37.4 34.8 - 46.6 %   Platelets 246 145 - 400 10e3/uL   MCV 103.8 (H) 79.5 - 101.0 fL   MCH 34.7 (H) 25.1 - 34.0 pg   MCHC 33.4 31.5 - 36.0 g/dL   RBC 3.60 (L) 3.70 - 5.45 10e6/uL   RDW 14.3 11.2 - 14.5 %   lymph# 0.6 (L) 0.9 - 3.3 10e3/uL   MONO# 0.2 0.1 - 0.9 10e3/uL   Eosinophils Absolute 0.0 0.0 - 0.5 10e3/uL   Basophils Absolute 0.0 0.0 - 0.1 10e3/uL   NEUT% 50.9 38.4 - 76.8 %   LYMPH% 36.4 14.0 - 49.7 %   MONO% 8.5 0.0 - 14.0 %   EOS% 2.2 0.0 - 7.0 %   BASO% 2.0 0.0 - 2.0 %  Comprehensive metabolic panel     Status: Abnormal   Collection Time: 12/27/15  8:30 AM  Result Value Ref Range   Sodium 140 136 - 145 mEq/L   Potassium 4.4 3.5 - 5.1 mEq/L   Chloride 105 98 - 109 mEq/L   CO2 26 22 - 29 mEq/L   Glucose 122 70 - 140  mg/dl    Comment: Glucose reference range is for nonfasting patients. Fasting glucose reference range is 70- 100.   BUN 20.0 7.0 - 26.0 mg/dL   Creatinine 1.0 0.6 - 1.1 mg/dL   Total Bilirubin <0.30 0.20 - 1.20 mg/dL   Alkaline Phosphatase 95 40 - 150 U/L   AST 30 5 - 34 U/L   ALT 41 0 - 55 U/L   Total Protein 7.1 6.4 - 8.3 g/dL   Albumin 3.6 3.5 - 5.0 g/dL   Calcium 9.6 8.4 - 10.4 mg/dL   Anion Gap 9 3 - 11 mEq/L   EGFR 67 (L) >90 ml/min/1.73 m2    Comment: eGFR is calculated using the CKD-EPI Creatinine Equation (2009)    CA 2729 available after visit 66, first time thru [facility] lab  PATHOLOGY: FINAL for Jill Johnston, Jill Johnston ([id number]) Patient: Jill Johnston, Jill Johnston Collected: 10/01/2014 Client: [facility] Accession: [id number] Received: 10/01/2014 Art Hoss DOB: 08-02-1965 Age: 29 Gender: F Reported: 10/03/2014 [address]: REPORT OF SURGICAL PATHOLOGY ADDITIONAL INFORMATION: CHROMOGENIC IN-SITU HYBRIDIZATION Results: HER-2/NEU BY CISH - NEGATIVE. RESULT RATIO OF HER2: CEP 17 SIGNALS 1.10 AVERAGE HER2 COPY NUMBER PER CELL  2.25 REFERENCE RANGE NEGATIVE HER2/Chr17 Ratio <2.0 and Average HER2 copy number <4.0 EQUIVOCAL HER2/Chr17 Ratio <2.0 and Average HER2 copy number 4.0 and <6.0 POSITIVE HER2/Chr17 Ratio >=2.0 and/or Average HER2 copy number >=6.0 Enid Cutter MD Pathologist, Electronic Signature ( Signed 10/09/2014) PROGNOSTIC INDICATORS - ACIS Results: IMMUNOHISTOCHEMICAL AND MORPHOMETRIC ANALYSIS BY THE AUTOMATED CELLULAR IMAGING SYSTEM (ACIS) Estrogen Receptor: 97%, POSITIVE, STRONG STAINING INTENSITY Progesterone Receptor: 0%, NEGATIVE COMMENT: The negative hormone receptor study(ies) in this case has no internal positive control. REFERENCE RANGE ESTROGEN RECEPTOR NEGATIVE <1% POSITIVE =>1% PROGESTERONE RECEPTOR  NEGATIVE <1% POSITIVE =>1% All controls stained appropriately [place] RUND DO  FINAL DIAGNOSIS Diagnosis Bone, biopsy, left iliac lesion - METASTATIC ADENOCARCINOMA, SEE COMMENT. Microscopic Comment Immunohistochemistry reveals the malignant cells are positive for cytokeratin 7, ER, and GCDFP (focal). They are negative for cytokeratin 20, TTF-1, CDX-2, and PR. Given the patient's history, the overall findings are consistent with metastatic adenocarcinoma of the breast. Prognostic markers will be reported in an addendum.  RADIOGRAPHY: CT CAP [facility] 10-25-15 reviewed in outside records and will be scanned into this EMR.    Breast Johnston 2D DIGITAL SCREENING UNILATERAL RIGHT MAMMOGRAM WITH CAD AND ADJUNCT TOMO  COMPARISON: Previous exam(s).  ACR Breast Density Category c: The breast tissue is heterogeneously dense, which may obscure small masses.  FINDINGS: There are no findings suspicious for malignancy. There has been a previous left mastectomy. Images were processed with CAD.  IMPRESSION: No mammographic evidence of malignancy. A result letter of this screening mammogram will be mailed directly to the patient.  RECOMMENDATION: Screening mammogram in one  year. (Code:SM-B-01Y)  BI-RADS CATEGORY 1: Negative.   DISCUSSION: All of above history reviewed in detail with patient and husband. I have told them that [place] Johnston felt likely some change in therapy would be needed after her trip to [place], based on gradual changes in marker. We may want to repeat PET after her trip. I will see her back in ~ 3 weeks, shortly prior to her with denosumab injection, and will be sure she has all medications needed for trip. Patient was not entirely aware of marker information prior to discussion today, which I did not know at the time.     IMPRESSION / PLAN:  1.Metastatic breast cancer extensively involving bone: initial diagnosis at age 3 in  04-2007, T2N1Mx ER + HER 2 -, treated with neoadjuvant AC taxol, mastectomy with node evaluation, radiation, tamoxifen x 7 years. Since recurrence she has had denosumab monthly, letrozole + palbociclib (dose reduced due to neutropenia). Some increase in marker recently but not any markedly different symptoms, hoping to continue same until she is back from trip to [place] that she has been looking forward to.  2.nocturnal GERD: I have encouraged her to make follow up appointment with Jill Laural Golden. At husband's request, I have called that office and LM 3.Lymphedema LUE: stable per patient. May want lymphedema PT evaluation at some point. 4.post cholecystectomy 5.up to date on gyn exams, ? Which MD in [place] 6.BRCA testing negative in 2008 7.recent dental care, timing coordinated with denosumab 8.no advance directives, patient declined information today 9.pain related to metastatic breast cancer: adequately controlled with present oxycontin. Note AM stiffness may also be related to letrozole.  Patient and husband have had questions answered to their satisfaction and are in agreement with plan above. They can contact this office for questions or concerns at any time prior to next scheduled visit. Cc Drs Jill Johnston, Theda Sers Time spent 90 min, including >50% discussion and coordination of care.    LIVESAY,LENNIS P, MD 12/27/2015 11:42 AM

## 2015-12-27 NOTE — Telephone Encounter (Signed)
appt made and avs printed °

## 2015-12-28 LAB — CANCER ANTIGEN 27-29 (PARALLEL TESTING): CA 27.29: 65 U/mL — AB (ref <38)

## 2015-12-28 LAB — CANCER ANTIGEN 27.29: CAN 27.29: 66 U/mL — AB (ref 0.0–38.6)

## 2015-12-30 ENCOUNTER — Telehealth: Payer: Self-pay

## 2015-12-30 NOTE — Telephone Encounter (Signed)
[name] called to see if Jill Johnston neded to be seen sooner then September.  This was Dr. Laural Golden first available appointment.   Can she be seen by NP Deberah Castle? Told her that this would be fine as she has ongoing GERD symptoms at [facility] and needs follow up per Dr. Marko Plume. [name] to try to have Jill Meyn seen on 01-02-15 as she has left a message for patient to call her back.

## 2016-01-06 ENCOUNTER — Encounter (INDEPENDENT_AMBULATORY_CARE_PROVIDER_SITE_OTHER): Payer: Self-pay | Admitting: Internal Medicine

## 2016-01-06 ENCOUNTER — Ambulatory Visit (INDEPENDENT_AMBULATORY_CARE_PROVIDER_SITE_OTHER): Payer: BLUE CROSS/BLUE SHIELD | Admitting: Internal Medicine

## 2016-01-06 VITALS — BP 90/50 | HR 64 | Temp 97.5°F | Ht 65.0 in | Wt 158.5 lb

## 2016-01-06 DIAGNOSIS — K2100 Gastro-esophageal reflux disease with esophagitis, without bleeding: Secondary | ICD-10-CM

## 2016-01-06 MED ORDER — PANTOPRAZOLE SODIUM 40 MG PO TBEC: 40.0000 mg | DELAYED_RELEASE_TABLET | Freq: Two times a day (BID) | ORAL | Status: DC

## 2016-01-06 NOTE — Patient Instructions (Signed)
Protonix 40mg  BID. GERD diet given to patient. OV in 6 months

## 2016-01-06 NOTE — Progress Notes (Signed)
   Subjective:    Patient ID: Jill Johnston, female    DOB: [birth year], 50 y.o.   MRN: [id number]  HPI Here for f/u. Underwent an EGD in January for intermittent heartburn.  She tells me she has some burning in her esophagus. The acid reflux occurs more at night. She tells she just ate cucumbers and tomatoes and she has acid reflux.  She tries to avoid spicy. She tries to eat a small amt of food when she eat. She avoids junk foods. Her appetite is good for the most part. She usually has a BM daily. No melena. Takes Miralax for constipation.  She states she has breast cancer and is not in remission. Hx of bone metastasis. She says her cancer markers have increased. See Dr. Renelda Mom in [place] for her breast cancer.    07/12/2015 EGD  Indications: Patient is 50 year old Caucasian female with history of metastatic breast carcinoma who Presents with intermittent heartburn regurgitation and she's had 4 episodes of nocturnal regurgitation with aspiration. She was begun on pantoprazole last week and she feels better. She is undergoing diagnostic EGD.  Impression: Erosive reflux esophagitis. Small sliding hiatal hernia. No evidence of peptic ulcer or pyloric stenosis. Review of Systems  The risks and benefits such as perforation, bleeding, and infection were reviewed with the patient and is agreeable.    Objective:   Physical ExamBlood pressure 90/50, pulse 64, temperature 97.5 F (36.4 C), height 5\' 5"  (1.651 m), weight 158 lb 8 oz (71.895 kg). Alert and oriented. Skin warm and dry. Oral mucosa is moist.   . Sclera anicteric, conjunctivae is pink. Thyroid not enlarged. No cervical lymphadenopathy. Lungs clear. Heart regular rate and rhythm.  Abdomen is soft. Bowel sounds are positive. No hepatomegaly. No abdominal masses felt. No tenderness.  No edema to lower extremities.           Assessment & Plan:  GERD. She is having break thru symptoms. Am going increase her Protonix to twice a day. GERD diet given to patient. She will call and let me know if this helps. OV in 6 months.

## 2016-01-12 ENCOUNTER — Encounter: Payer: Self-pay | Admitting: Oncology

## 2016-01-12 DIAGNOSIS — Z9049 Acquired absence of other specified parts of digestive tract: Secondary | ICD-10-CM | POA: Insufficient documentation

## 2016-01-12 DIAGNOSIS — C50912 Malignant neoplasm of unspecified site of left female breast: Secondary | ICD-10-CM | POA: Insufficient documentation

## 2016-01-12 DIAGNOSIS — C7951 Secondary malignant neoplasm of bone: Principal | ICD-10-CM

## 2016-01-12 DIAGNOSIS — G893 Neoplasm related pain (acute) (chronic): Secondary | ICD-10-CM | POA: Insufficient documentation

## 2016-01-12 DIAGNOSIS — I890 Lymphedema, not elsewhere classified: Secondary | ICD-10-CM | POA: Insufficient documentation

## 2016-01-20 ENCOUNTER — Telehealth (INDEPENDENT_AMBULATORY_CARE_PROVIDER_SITE_OTHER): Payer: Self-pay | Admitting: Internal Medicine

## 2016-01-20 NOTE — Telephone Encounter (Signed)
May need to do a prior authorization

## 2016-01-20 NOTE — Telephone Encounter (Signed)
Per Karna Christmas, may need authorization

## 2016-01-20 NOTE — Telephone Encounter (Signed)
Patient's spouse, Legrand Como called, stated that you prescribed Pantoprazole 40mg , which she was already taking, but doubled it.  Stated that the pharmacy stated that that insurance will only pay for 1 tablet a day.  He would like you to call him.  [phone]

## 2016-01-20 NOTE — Telephone Encounter (Signed)
I have called the [employer]  and they are going to approve a 5 day emergency supply. Meanwhile, they are sending a PA for [place] to complete.They ask that we send it back expedited. Patient's husband to be made aware.

## 2016-01-22 ENCOUNTER — Other Ambulatory Visit: Payer: Self-pay | Admitting: Oncology

## 2016-01-23 ENCOUNTER — Other Ambulatory Visit (HOSPITAL_BASED_OUTPATIENT_CLINIC_OR_DEPARTMENT_OTHER): Payer: BLUE CROSS/BLUE SHIELD

## 2016-01-23 ENCOUNTER — Telehealth: Payer: Self-pay | Admitting: Oncology

## 2016-01-23 ENCOUNTER — Ambulatory Visit (HOSPITAL_BASED_OUTPATIENT_CLINIC_OR_DEPARTMENT_OTHER): Payer: BLUE CROSS/BLUE SHIELD

## 2016-01-23 ENCOUNTER — Encounter: Payer: Self-pay | Admitting: Oncology

## 2016-01-23 ENCOUNTER — Other Ambulatory Visit: Payer: Self-pay

## 2016-01-23 ENCOUNTER — Ambulatory Visit (HOSPITAL_BASED_OUTPATIENT_CLINIC_OR_DEPARTMENT_OTHER): Payer: BLUE CROSS/BLUE SHIELD | Admitting: Oncology

## 2016-01-23 VITALS — BP 134/82 | HR 75 | Temp 97.9°F | Resp 18 | Ht 65.0 in | Wt 160.2 lb

## 2016-01-23 DIAGNOSIS — I890 Lymphedema, not elsewhere classified: Secondary | ICD-10-CM

## 2016-01-23 DIAGNOSIS — C50919 Malignant neoplasm of unspecified site of unspecified female breast: Secondary | ICD-10-CM

## 2016-01-23 DIAGNOSIS — C7951 Secondary malignant neoplasm of bone: Principal | ICD-10-CM

## 2016-01-23 DIAGNOSIS — K219 Gastro-esophageal reflux disease without esophagitis: Secondary | ICD-10-CM

## 2016-01-23 DIAGNOSIS — D702 Other drug-induced agranulocytosis: Secondary | ICD-10-CM | POA: Diagnosis not present

## 2016-01-23 DIAGNOSIS — G893 Neoplasm related pain (acute) (chronic): Secondary | ICD-10-CM

## 2016-01-23 DIAGNOSIS — C50912 Malignant neoplasm of unspecified site of left female breast: Secondary | ICD-10-CM

## 2016-01-23 DIAGNOSIS — Z79899 Other long term (current) drug therapy: Secondary | ICD-10-CM

## 2016-01-23 DIAGNOSIS — Z170 Estrogen receptor positive status [ER+]: Secondary | ICD-10-CM

## 2016-01-23 LAB — COMPREHENSIVE METABOLIC PANEL
ALBUMIN: 3.8 g/dL (ref 3.5–5.0)
ALK PHOS: 87 U/L (ref 40–150)
ALT: 45 U/L (ref 0–55)
AST: 38 U/L — AB (ref 5–34)
Anion Gap: 9 mEq/L (ref 3–11)
BILIRUBIN TOTAL: 0.41 mg/dL (ref 0.20–1.20)
BUN: 18 mg/dL (ref 7.0–26.0)
CO2: 28 meq/L (ref 22–29)
CREATININE: 1 mg/dL (ref 0.6–1.1)
Calcium: 9.9 mg/dL (ref 8.4–10.4)
Chloride: 104 mEq/L (ref 98–109)
EGFR: 68 mL/min/{1.73_m2} — ABNORMAL LOW (ref >90)
GLUCOSE: 92 mg/dL (ref 70–140)
Potassium: 4.7 mEq/L (ref 3.5–5.1)
SODIUM: 140 meq/L (ref 136–145)
TOTAL PROTEIN: 7.4 g/dL (ref 6.4–8.3)

## 2016-01-23 LAB — CBC WITH DIFFERENTIAL/PLATELET
BASO%: 1.5 % (ref 0.0–2.0)
Basophils Absolute: 0 10*3/uL (ref 0.0–0.1)
EOS ABS: 0 10*3/uL (ref 0.0–0.5)
EOS%: 2.1 % (ref 0.0–7.0)
HCT: 37.5 % (ref 34.8–46.6)
HEMOGLOBIN: 12.5 g/dL (ref 11.6–15.9)
LYMPH%: 35.6 % (ref 14.0–49.7)
MCH: 34.4 pg — ABNORMAL HIGH (ref 25.1–34.0)
MCHC: 33.3 g/dL (ref 31.5–36.0)
MCV: 103.3 fL — AB (ref 79.5–101.0)
MONO#: 0.1 10*3/uL (ref 0.1–0.9)
MONO%: 5.7 % (ref 0.0–14.0)
NEUT%: 55.1 % (ref 38.4–76.8)
NEUTROS ABS: 1.1 10*3/uL — AB (ref 1.5–6.5)
Platelets: 243 10*3/uL (ref 145–400)
RBC: 3.63 10*6/uL — AB (ref 3.70–5.45)
RDW: 13.4 % (ref 11.2–14.5)
WBC: 1.9 10*3/uL — AB (ref 3.9–10.3)
lymph#: 0.7 10*3/uL — ABNORMAL LOW (ref 0.9–3.3)

## 2016-01-23 MED ORDER — LISINOPRIL 10 MG PO TABS: 10.0000 mg | ORAL_TABLET | Freq: Every day | ORAL | 0 refills | Status: DC

## 2016-01-23 MED ORDER — LORAZEPAM 0.5 MG PO TABS: 0.5000 mg | ORAL_TABLET | Freq: Four times a day (QID) | ORAL | 0 refills | Status: DC | PRN

## 2016-01-23 MED ORDER — OXYCODONE HCL ER 30 MG PO T12A: 30.0000 mg | EXTENDED_RELEASE_TABLET | Freq: Two times a day (BID) | ORAL | 0 refills | Status: DC

## 2016-01-23 MED ORDER — DENOSUMAB 120 MG/1.7ML ~~LOC~~ SOLN
120.0000 mg | Freq: Once | SUBCUTANEOUS | Status: AC
  Administered 2016-01-23: 120 mg via SUBCUTANEOUS
  Filled 2016-01-23: qty 1.7

## 2016-01-23 NOTE — Patient Instructions (Signed)
Denosumab injection  What is this medicine?  DENOSUMAB (den oh sue mab) slows bone breakdown. Prolia is used to treat osteoporosis in women after menopause and in men. Xgeva is used to prevent bone fractures and other bone problems caused by cancer bone metastases. Xgeva is also used to treat giant cell tumor of the bone.  This medicine may be used for other purposes; ask your health care provider or pharmacist if you have questions.  What should I tell my health care provider before I take this medicine?  They need to know if you have any of these conditions:  -dental disease  -eczema  -infection or history of infections  -kidney disease or on dialysis  -low blood calcium or vitamin D  -malabsorption syndrome  -scheduled to have surgery or tooth extraction  -taking medicine that contains denosumab  -thyroid or parathyroid disease  -an unusual reaction to denosumab, other medicines, foods, dyes, or preservatives  -pregnant or trying to get pregnant  -breast-feeding  How should I use this medicine?  This medicine is for injection under the skin. It is given by a health care professional in a hospital or clinic setting.  If you are getting Prolia, a special MedGuide will be given to you by the pharmacist with each prescription and refill. Be sure to read this information carefully each time.  For Prolia, talk to your pediatrician regarding the use of this medicine in children. Special care may be needed. For Xgeva, talk to your pediatrician regarding the use of this medicine in children. While this drug may be prescribed for children as young as 13 years for selected conditions, precautions do apply.  Overdosage: If you think you have taken too much of this medicine contact a poison control center or emergency room at once.  NOTE: This medicine is only for you. Do not share this medicine with others.  What if I miss a dose?  It is important not to miss your dose. Call your doctor or health care professional if you are  unable to keep an appointment.  What may interact with this medicine?  Do not take this medicine with any of the following medications:  -other medicines containing denosumab  This medicine may also interact with the following medications:  -medicines that suppress the immune system  -medicines that treat cancer  -steroid medicines like prednisone or cortisone  This list may not describe all possible interactions. Give your health care provider a list of all the medicines, herbs, non-prescription drugs, or dietary supplements you use. Also tell them if you smoke, drink alcohol, or use illegal drugs. Some items may interact with your medicine.  What should I watch for while using this medicine?  Visit your doctor or health care professional for regular checks on your progress. Your doctor or health care professional may order blood tests and other tests to see how you are doing.  Call your doctor or health care professional if you get a cold or other infection while receiving this medicine. Do not treat yourself. This medicine may decrease your body's ability to fight infection.  You should make sure you get enough calcium and vitamin D while you are taking this medicine, unless your doctor tells you not to. Discuss the foods you eat and the vitamins you take with your health care professional.  See your dentist regularly. Brush and floss your teeth as directed. Before you have any dental work done, tell your dentist you are receiving this medicine.  Do   not become pregnant while taking this medicine or for 5 months after stopping it. Women should inform their doctor if they wish to become pregnant or think they might be pregnant. There is a potential for serious side effects to an unborn child. Talk to your health care professional or pharmacist for more information.  What side effects may I notice from receiving this medicine?  Side effects that you should report to your doctor or health care professional as soon as  possible:  -allergic reactions like skin rash, itching or hives, swelling of the face, lips, or tongue  -breathing problems  -chest pain  -fast, irregular heartbeat  -feeling faint or lightheaded, falls  -fever, chills, or any other sign of infection  -muscle spasms, tightening, or twitches  -numbness or tingling  -skin blisters or bumps, or is dry, peels, or red  -slow healing or unexplained pain in the mouth or jaw  -unusual bleeding or bruising  Side effects that usually do not require medical attention (Report these to your doctor or health care professional if they continue or are bothersome.):  -muscle pain  -stomach upset, gas  This list may not describe all possible side effects. Call your doctor for medical advice about side effects. You may report side effects to FDA at 1-800-FDA-1088.  Where should I keep my medicine?  This medicine is only given in a clinic, doctor's office, or other health care setting and will not be stored at home.  NOTE: This sheet is a summary. It may not cover all possible information. If you have questions about this medicine, talk to your doctor, pharmacist, or health care provider.      2016, Elsevier/Gold Standard. (2011-12-14 12:37:47)

## 2016-01-23 NOTE — Progress Notes (Signed)
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 She recently transferred care to [facility], as Dr Jacquiline Doe is not practicing with [facility] in [place]. Plan is for restaging scans in August after she returns from trip to [place]. She will have denosumab today. She is day 10 present cycle of Ibrance today, that previously dose reduced for neutropenia.   Patient has had no worsening of any problems since I met her on 12-27-15, tells me that she has felt better overall for past week. She has general body stiffness somewhat variable in AMs, improves later in day. She has had no pain in right upper arm in last 2 nights, no new or different pain otherwise including in back or long bones. She denies any fever or symptoms of infection. No SOB, no bleeding, no abdominal or pelvic discomfort, no LE swelling. Recent dental work, which included cleaning and 3 root canals, has healed with not residual discomfort. She continues to play tennis. Remainder of 10 point Review of Systems negative.   Had [facility] for adjuvant chemotherapy, subsequently removed No genetics testing  She will travel to [place] July 31, returning Mar 20, 2023. Patient's mother died recently, this trip to complete arrangements for her home.  ONCOLOGIC HISTORY Patient was in excellent health until diagnosed with multifocal cT2 cN2 Mx carcinoma upper inner quadrant left breast 05-17-2007. The malignancy was ER + 100%, PR 1%, HER 2 IHC/FISH negative, Ki67 52%, BRCA 1/2 negative, initial CA 2729 WNL. She had neoadjuvant AC x4 taxol x12 dose dense from 06-10-2007 thru 09-28-2007. 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  She had radiation to chest wall, supraclavicular region and left axilla 50.4 Gy with boost to mastectomy scar to 60.4 Gy, by Dr [name] from 12-15-2007 thru 01-31-2008.  She was on tamoxifen from 02-20-2008 thru 09-25-2014. She developed pain in ribs 08-2014, with CA 2729 52 and PET CT and MRI with diffuse bone metastatic disease, no cord compression, small lung lesions and intrathoracic adenopathy. 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 She began Denosumab 120 mcg monthly starting 4-7-201, and letrozole + palbociclib beginning4-12-2014. She was seen in consultation by Dr [name] [phone].  Palbociclib dose decreased to 100 mg daily x 21 q 28 days due to neutropenia. CBC on 10-25-15: WBC 2.1, Hgb 13, plt 186, ANC 0.9 Most recent PET was 09-25-2014 in [facility]; most recent imaging was CT CAP at [facility] 10-25-15 with no clear pulmonary involvement, stable 2 cm lesion at dome of liver and 11 mm left hepatic lobe lesion. She had right tomo mammogram at [facility] 09-17-15, with heterogeneously dense breast tissue but no other mammographic findings of concern.  CA 2729 has recently shown some increase, from 38 in 08-2015 to 49 in April 2017 and 65 in May 2017. She had evaluation for vaginal bleeding "from polyp", follows yearly with gynecologist in [place], up to date.     Objective:  Vital signs in last 24  hours:  BP 134/82 (BP Location: Right Arm, Patient Position: Sitting)   Pulse 75   Temp 97.9 F (36.6 C) (Oral)   Resp 18   Ht _0  (1.651 m)   Wt 160 lb 3.2 oz (72.7 kg)   SpO2 98%   BMI 26.66 kg/m  Weight down 2 lbs Alert, oriented and appropriate. Ambulatory without assistance, easily able to get on and off exam table.  No alopecia  HEENT:PERRL, sclerae not icteric. Oral mucosa moist without  lesions, no visible problems at sites of dental work recently,  posterior pharynx clear.  Neck supple. No JVD.  Lymphatics:no cervical,supraclavicular, axillary adenopathy Resp: clear to auscultation bilaterally and normal percussion bilaterally Cardio: regular rate and rhythm. No gallop. GI: soft, nontender, not distended, no mass or organomegaly. Normally active bowel sounds.  Musculoskeletal/ Extremities: without pitting edema, cords, tenderness Neuro: no peripheral neuropathy. Otherwise nonfocal. PSYCH appropriate mood and affect Skin without rash, ecchymosis, petechiae. Well healed scar at site of previous PAC She did not undress for breast exam.  Lab Results:  Results for orders placed or performed in visit on 01/23/16  CBC with Differential  Result Value Ref Range   WBC 1.9 (L) 3.9 - 10.3 10e3/uL   NEUT# 1.1 (L) 1.5 - 6.5 10e3/uL   HGB 12.5 11.6 - 15.9 g/dL   HCT 37.5 34.8 - 46.6 %   Platelets 243 145 - 400 10e3/uL   MCV 103.3 (H) 79.5 - 101.0 fL   MCH 34.4 (H) 25.1 - 34.0 pg   MCHC 33.3 31.5 - 36.0 g/dL   RBC 3.63 (L) 3.70 - 5.45 10e6/uL   RDW 13.4 11.2 - 14.5 %   lymph# 0.7 (L) 0.9 - 3.3 10e3/uL   MONO# 0.1 0.1 - 0.9 10e3/uL   Eosinophils Absolute 0.0 0.0 - 0.5 10e3/uL   Basophils Absolute 0.0 0.0 - 0.1 10e3/uL   NEUT% 55.1 38.4 - 76.8 %   LYMPH% 35.6 14.0 - 49.7 %   MONO% 5.7 0.0 - 14.0 %   EOS% 2.1 0.0 - 7.0 %   BASO% 1.5 0.0 - 2.0 %  Comprehensive metabolic panel  Result Value Ref Range   Sodium 140 136 - 145 mEq/L   Potassium 4.7 3.5 - 5.1 mEq/L   Chloride 104 98 - 109 mEq/L   CO2 28 22 - 29 mEq/L   Glucose 92 70 - 140 mg/dl   BUN 18.0 7.0 - 26.0 mg/dL   Creatinine 1.0 0.6 - 1.1 mg/dL   Total Bilirubin 0.41 0.20 - 1.20 mg/dL   Alkaline Phosphatase 87 40 - 150 U/L   AST 38 (H) 5 - 34 U/L   ALT 45 0 - 55 U/L   Total Protein 7.4 6.4 - 8.3 g/dL   Albumin 3.8 3.5 - 5.0 g/dL   Calcium 9.9 8.4 - 10.4 mg/dL   Anion Gap 9 3 - 11 mEq/L   EGFR 68 (L) >90  ml/min/1.73 m2    Labs reviewed with patient at time of visit. ANC better today than 0.9 at initial consultation, when she was near completion of 21 days cycle of Ibrance  CA 2729 by new and parallel testing was 65 /66 on 12-27-15, having been 24 in May 2017 and 59 in April 2017 by Dr Reynaldo Minium labs.   Studies/Results:  No results found.  Medications: I have reviewed the patient's current medications. She is using the oxycontin 30 mg bid, which is refilled due to upcoming trip overseas; she has sufficient of the prn  oxycodone. She requests something to help her sleep including on plane, decided to try ativan 0.5 mg as this may also be useful for some nausea. She will try the ativan x1 at hs prior to leaving for trip, as she has not used this previously. Denosumab given following MD visit today and will be given when she returns in late August.  Written list of medications medically necessary given to patient as documentation for the trip.   DISCUSSION Case discussed with my partners in breast oncology, recommendation for     exemestane/ everolimus as next line if need to change from present letrozole / palbociclib; if progresses or does not tolerate fulvestrant/palbociclib; if visceral diseasewould go with eribulin .  I have told patient that I have gotten recommendations from my partners, but she prefers not to discuss in further detail now.  I have mentioned that some of the AM stiffness could be side effect of letrozole.  I have offered copies of medical records to take on her trip, which she declines.   Patient less anxious today than at first meeting, prefers visit prior to setting up scans when she returns in late [place].    Assessment/Plan:  1.Metastatic breast cancer extensively involving bone: initial diagnosis at age 17 in 04-2007, T2N1Mx ER + HER 2 -, treated with neoadjuvant AC taxol, mastectomy with node evaluation, radiation, tamoxifen x 7 years. Since recurrence 08-2014,  she has had denosumab monthly, letrozole + palbociclib (dose reduced due to neutropenia) all begun 09-2014. Some increase in marker recently but not any markedly different symptoms, hoping to continue same until she is back from trip to [place].  2.nocturnal GERD: I do not believe that she has gotten back to Dr Olevia Perches office. Continues protonix, not complaining of increased symptoms today 3.Lymphedema LUE: stable per patient. May want lymphedema PT evaluation at some point. 4.post cholecystectomy 5.up to date on gyn exams, patient does not recall name of MD in [place] 6.BRCA testing negative in 2008 7.recent dental care, timing coordinated with denosumab. Stable for denosumab today 8.no advance directives, patient declined information today 9.pain related to metastatic breast cancer: adequately controlled with present oxycontin. Note AM stiffness may also be related to letrozole. 10. Drug induced leukopenia/ neutropenia: ibrance, dose as above. Patient is aware that immune status is compromised.   All questions answered and patient is in agreement with recommendations and plans. I will see her back with lab and denosumab on 02-27-16. Time spent 30 min including >50% counseling and coordination of care. Cc Dr Laural Golden   Evlyn Clines, MD   01/23/2016, 2:57 PM

## 2016-01-23 NOTE — Telephone Encounter (Signed)
appt made and avs printed °

## 2016-01-26 ENCOUNTER — Other Ambulatory Visit: Payer: Self-pay | Admitting: Oncology

## 2016-01-26 DIAGNOSIS — Z79899 Other long term (current) drug therapy: Secondary | ICD-10-CM | POA: Insufficient documentation

## 2016-01-26 DIAGNOSIS — D702 Other drug-induced agranulocytosis: Secondary | ICD-10-CM | POA: Insufficient documentation

## 2016-02-23 ENCOUNTER — Other Ambulatory Visit: Payer: Self-pay | Admitting: Oncology

## 2016-02-27 ENCOUNTER — Other Ambulatory Visit (HOSPITAL_BASED_OUTPATIENT_CLINIC_OR_DEPARTMENT_OTHER): Payer: BLUE CROSS/BLUE SHIELD

## 2016-02-27 ENCOUNTER — Other Ambulatory Visit: Payer: Self-pay

## 2016-02-27 ENCOUNTER — Ambulatory Visit: Payer: BLUE CROSS/BLUE SHIELD

## 2016-02-27 ENCOUNTER — Encounter: Payer: Self-pay | Admitting: Oncology

## 2016-02-27 ENCOUNTER — Ambulatory Visit (HOSPITAL_BASED_OUTPATIENT_CLINIC_OR_DEPARTMENT_OTHER): Payer: BLUE CROSS/BLUE SHIELD | Admitting: Oncology

## 2016-02-27 VITALS — BP 129/90 | HR 78 | Temp 98.2°F | Resp 17 | Wt 160.3 lb

## 2016-02-27 DIAGNOSIS — C7951 Secondary malignant neoplasm of bone: Secondary | ICD-10-CM | POA: Diagnosis not present

## 2016-02-27 DIAGNOSIS — I890 Lymphedema, not elsewhere classified: Secondary | ICD-10-CM

## 2016-02-27 DIAGNOSIS — G893 Neoplasm related pain (acute) (chronic): Secondary | ICD-10-CM

## 2016-02-27 DIAGNOSIS — C50912 Malignant neoplasm of unspecified site of left female breast: Secondary | ICD-10-CM | POA: Diagnosis not present

## 2016-02-27 DIAGNOSIS — D702 Other drug-induced agranulocytosis: Secondary | ICD-10-CM | POA: Diagnosis not present

## 2016-02-27 DIAGNOSIS — Z170 Estrogen receptor positive status [ER+]: Secondary | ICD-10-CM

## 2016-02-27 DIAGNOSIS — K219 Gastro-esophageal reflux disease without esophagitis: Secondary | ICD-10-CM

## 2016-02-27 DIAGNOSIS — R978 Other abnormal tumor markers: Secondary | ICD-10-CM

## 2016-02-27 DIAGNOSIS — Z79899 Other long term (current) drug therapy: Secondary | ICD-10-CM

## 2016-02-27 LAB — COMPREHENSIVE METABOLIC PANEL
ALBUMIN: 3.4 g/dL — AB (ref 3.5–5.0)
ALK PHOS: 104 U/L (ref 40–150)
ALT: 53 U/L (ref 0–55)
ANION GAP: 7 meq/L (ref 3–11)
AST: 41 U/L — ABNORMAL HIGH (ref 5–34)
BILIRUBIN TOTAL: 0.52 mg/dL (ref 0.20–1.20)
BUN: 13.7 mg/dL (ref 7.0–26.0)
CO2: 27 meq/L (ref 22–29)
CREATININE: 1 mg/dL (ref 0.6–1.1)
Calcium: 9.5 mg/dL (ref 8.4–10.4)
Chloride: 104 mEq/L (ref 98–109)
EGFR: 62 mL/min/{1.73_m2} — ABNORMAL LOW (ref >90)
GLUCOSE: 107 mg/dL (ref 70–140)
Potassium: 4.4 mEq/L (ref 3.5–5.1)
Sodium: 139 mEq/L (ref 136–145)
Total Protein: 7.2 g/dL (ref 6.4–8.3)

## 2016-02-27 LAB — CBC WITH DIFFERENTIAL/PLATELET
BASO%: 1.1 % (ref 0.0–2.0)
BASOS ABS: 0 10*3/uL (ref 0.0–0.1)
EOS ABS: 0.1 10*3/uL (ref 0.0–0.5)
EOS%: 3.7 % (ref 0.0–7.0)
HCT: 36.8 % (ref 34.8–46.6)
HEMOGLOBIN: 12.2 g/dL (ref 11.6–15.9)
LYMPH%: 33.2 % (ref 14.0–49.7)
MCH: 34.4 pg — ABNORMAL HIGH (ref 25.1–34.0)
MCHC: 33.2 g/dL (ref 31.5–36.0)
MCV: 103.7 fL — ABNORMAL HIGH (ref 79.5–101.0)
MONO#: 0.1 10*3/uL (ref 0.1–0.9)
MONO%: 7.5 % (ref 0.0–14.0)
NEUT%: 54.5 % (ref 38.4–76.8)
NEUTROS ABS: 1 10*3/uL — AB (ref 1.5–6.5)
NRBC: 0 % (ref 0–0)
PLATELETS: 208 10*3/uL (ref 145–400)
RBC: 3.55 10*6/uL — AB (ref 3.70–5.45)
RDW: 13.7 % (ref 11.2–14.5)
WBC: 1.9 10*3/uL — AB (ref 3.9–10.3)
lymph#: 0.6 10*3/uL — ABNORMAL LOW (ref 0.9–3.3)

## 2016-02-27 MED ORDER — OXYCODONE HCL ER 30 MG PO T12A: 30.0000 mg | EXTENDED_RELEASE_TABLET | Freq: Two times a day (BID) | ORAL | 0 refills | Status: DC

## 2016-02-27 NOTE — Progress Notes (Signed)
OFFICE PROGRESS NOTE   February 27, 2016   Physicians:  Jacquiline Doe, [name] Plump, [place], [place] Muss, Ardean Larsen) , gyn in [place] No PCP  INTERVAL HISTORY:  Patient is seen, together with husband, in continuing attention to metastatic ER+ HER2 negative left breast cancer. She had adjuvant AC taxol 2009 followed by tamoxifen until found to have metastatic disease to bone 08-2014.  She has been on letrozole, palbociclib and denosumab since 09-2014. CA 2729 marker has recently increased.  Plan is for PET as patient has returned from planned trip overseas.  Patient broke a right upper tooth while overseas, this recapped including root canal 02-24-16 by her dentist Karma Ganja in [place] and an Chief Financial Officer there. She has very little discomfort in area of the tooth now, but understands best to hold denosumab that had been planned today.  She otherwise enjoyed the trip very much, was quite active with minimal discomfort, and tells me that she feels better overall now than prior to the trip. She and husband climbed [employer] in [place], swam in ocean at night and hiked in [place]. Appetite was good. She had one episode of left chest wall pain which resolved with rest for a day, some diarrhea/ constipation and some LE swelling after 10 hr flight + 2 hrs in customs. Pedal edema resolved promptly after returning home. Ativan 0.5 mg for flight did not help her sleep, tho she was more relaxed. She denies new or different pain, still notices slight swelling LUE (massage in [place] but not lymphedema trained therapist), no bleeding, no SOB or other respiratory symptoms. No fever or symptoms of infection. Remainder of 10 point Review of Systems negative.    (PAC used for adjuvant chemotherapy, subsequently removed) No genetics testing. NOTE likely eligible for testing either due to premenopausal breast ca or by [facility].  Message sent to genetics counselor re eligibility for testing.   Dr  Jacquiline Doe presently working in [place].  ONCOLOGIC HISTORY Patient was in excellent health until diagnosed with multifocal cT2 cN2 Mx carcinoma upper inner quadrant left breast 05-17-2007. The malignancy was ER + 100%, PR 1%, HER 2 IHC/FISH negative, Ki67 52%, BRCA 1/2 negative, initial CA 2729 WNL. She had neoadjuvant AC x4 taxol x12 dose dense from 06-10-2007 thru 09-28-2007. Surgery 10-27-2007 was left modified radical mastectomy with sentinel nodes I and II axillary dissection by Dr Ardean Larsen at [facility] [place] pMx, +LMI, grade 2/3, DCIS +, clinical stage IIB. She had radiation to chest wall, supraclavicular region and left axilla 50.4 Gy with boost to mastectomy scar to 60.4 Gy, by Dr [name] from 12-15-2007 thru 01-31-2008. She was on tamoxifen from 02-20-2008 thru 09-25-2014. She developed pain in ribs 08-2014, with CA 2729 52 and PET CT and MRI with diffuse bone metastatic disease, no cord compression, small lung lesions and intrathoracic adenopathy. 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 She began Denosumab 120 mcg monthly starting 4-7-201, and letrozole + palbociclib beginning4-12-2014. She was seen in consultation by Dr [name] [phone]. Palbociclib dose decreased to 100 mg daily x 21 q 28 days due to neutropenia. CBC on 10-25-15: WBC 2.1, Hgb 13, plt 186, ANC 0.9 Most recent PET was 09-25-2014 in [facility] system; most recent imaging was CT CAP at [facility] 10-25-15 with no clear pulmonary involvement, stable 2 cm lesion at dome of liver and 11 mm left hepatic lobe lesion. She had right tomo mammogram  at [facility] 09-17-15, with heterogeneously dense breast tissue but no other mammographic findings of concern.  CA 2729 has recently shown some increase, from 38 in 08-2015 to 49 in April 2017 and 65 in May 2017. She had evaluation for vaginal bleeding "from polyp", follows yearly with gynecologist in [place], up to date.    Objective:  Vital  signs in last 24 hours:  BP 129/90 (BP Location: Right Arm, Patient Position: Sitting)   Pulse 78   Temp 98.2 F (36.8 C) (Oral)   Resp 17   Wt 160 lb 4.8 oz (72.7 kg)   SpO2 97%   BMI 26.68 kg/m  Weight stable. Looks comfortable, talkative, easily mobile Alert, oriented and appropriate. Ambulatory without difficulty.  No alopecia  HEENT:PERRL, sclerae not icteric. Oral mucosa moist without lesions, posterior pharynx clear.  Neck supple. No JVD.  Lymphatics:no cervical,supraclavicular, axillary adenopathy Resp: clear to auscultation bilaterally and normal percussion bilaterally Cardio: regular rate and rhythm. No gallop. GI: soft, nontender, not distended. Normally active bowel sounds.  Musculoskeletal/ Extremities: Slight swelling LUE compared with RUE, without pitting edema, cords, tenderness Neuro: no peripheral neuropathy. Otherwise nonfocal. PSYCH cheerful and animated discussing her trip Skin without rash, ecchymosis, petechiae Breast exam not repeated   Lab Results:  Results for orders placed or performed in visit on 02/27/16  CBC with Differential  Result Value Ref Range   WBC 1.9 (L) 3.9 - 10.3 10e3/uL   NEUT# 1.0 (L) 1.5 - 6.5 10e3/uL   HGB 12.2 11.6 - 15.9 g/dL   HCT 36.8 34.8 - 46.6 %   Platelets 208 145 - 400 10e3/uL   MCV 103.7 (H) 79.5 - 101.0 fL   MCH 34.4 (H) 25.1 - 34.0 pg   MCHC 33.2 31.5 - 36.0 g/dL   RBC 3.55 (L) 3.70 - 5.45 10e6/uL   RDW 13.7 11.2 - 14.5 %   lymph# 0.6 (L) 0.9 - 3.3 10e3/uL   MONO# 0.1 0.1 - 0.9 10e3/uL   Eosinophils Absolute 0.1 0.0 - 0.5 10e3/uL   Basophils Absolute 0.0 0.0 - 0.1 10e3/uL   NEUT% 54.5 38.4 - 76.8 %   LYMPH% 33.2 14.0 - 49.7 %   MONO% 7.5 0.0 - 14.0 %   EOS% 3.7 0.0 - 7.0 %   BASO% 1.1 0.0 - 2.0 %   nRBC 0 0 - 0 %  Comprehensive metabolic panel  Result Value Ref Range   Sodium 139 136 - 145 mEq/L   Potassium 4.4 3.5 - 5.1 mEq/L   Chloride 104 98 - 109 mEq/L   CO2 27 22 - 29 mEq/L   Glucose 107 70 - 140  mg/dl   BUN 13.7 7.0 - 26.0 mg/dL   Creatinine 1.0 0.6 - 1.1 mg/dL   Total Bilirubin 0.52 0.20 - 1.20 mg/dL   Alkaline Phosphatase 104 40 - 150 U/L   AST 41 (H) 5 - 34 U/L   ALT 53 0 - 55 U/L   Total Protein 7.2 6.4 - 8.3 g/dL   Albumin 3.4 (L) 3.5 - 5.0 g/dL   Calcium 9.5 8.4 - 10.4 mg/dL   Anion Gap 7 3 - 11 mEq/L   EGFR 62 (L) >90 ml/min/1.73 m2   CA 2729 available after visit 94 by new lab method and 98 by parallel testing, compared with 66 and 68 on 12-27-15.   Studies/Results:  No results found.  Medications: I have reviewed the patient's current medications. Hold xgeva today due to dental procedure (root canal) 02-24-16.  She is presently ~ day 14 / 21 palbociclib (100 mg daily x 21 every 28 days, dose decreased due to neutropenia). As they do not have copay for the palbococlib, at time of visit husband asked about refilling to stay on schedule in case no change in regimen; with increase in marker, expect will need change in regimen and will let him know not to refill.   DISCUSSION Clinically stable and very much enjoyed the trip to [place].  I have told patient and husband that we expect at some point that any regimen will reach maximum benefit and will need to change treatment. I clarified with husband that patient had not wanted PET set up until this visit. I have told them that we will certainly not discontinue present regimen if still effective (that conversation prior to result of marker). She is known to Dr [name] and certainly can have second opinion if she wants that at any time.  She and husband agree with PET, understand that this may take up to ~ 2 weeks to [place], and will see me after the scan.  Written information for exemestane and everolimus (Afinitor) given to husband today. Patient glad to wait to discuss until results of PET available.    We did not discuss genetics testing, which I do not believe she has had done, but which likely can  be obtained thru [facility] program for metastatic breast patients; message to [facility] genetics counselors to clarify.     Assessment/Plan:  1.Metastatic breast cancer extensively involving bone: initial diagnosis at age 13 in 04-2007, T2N1Mx ER + HER 2 -, treated with neoadjuvant AC taxol, mastectomy with node evaluation, radiation, tamoxifen x 7 years. Since recurrence 08-2014, she has had denosumab monthly, letrozole + palbociclib (dose reduced due to neutropenia) all begun 09-2014. Some increase in marker continues but not more symptomatic,  PET/ CTs  requested.  2.nocturnal GERD: I do not believe that she has gotten back to Dr Olevia Perches office. Continues protonix, not complaining of increased symptoms today 3.Lymphedema LUE: stable per patient. May want lymphedema PT evaluation at some point. 4.post cholecystectomy 5.up to date on gyn exams, patient does not recall name of MD in [place] 6.BRCA testing negative in 2008 7.Root canal and dental work for broken tooth 02-24-16. Will wait at least 4 weeks for next xgeva, longer if any concerns 8.no advance directives, patient declined information  9.pain related to metastatic breast cancer: adequately controlled with present oxycontin. Note AM stiffness may also be related to letrozole. 10. Drug induced leukopenia/ neutropenia related to ibrance, dose reduced as previously. Patient is aware that immune status is compromised   All questions answered. Willl confirm follow up MD appointment when date of PET/ CTs known, likely will need to work her in for visit on day separate from regular clinic. Message to managed care re preauthorization. Time spent 30 min including >50% counseling and coordination of care. Cc dentist Karma Ganja in [place], MD   02/27/2016, 7:22 PM

## 2016-02-28 LAB — CANCER ANTIGEN 27.29: CA 27.29: 94 U/mL — ABNORMAL HIGH (ref 0.0–38.6)

## 2016-02-28 LAB — CANCER ANTIGEN 27-29 (PARALLEL TESTING): CA 27.29: 98 U/mL — ABNORMAL HIGH (ref <38)

## 2016-03-03 ENCOUNTER — Other Ambulatory Visit: Payer: Self-pay | Admitting: Oncology

## 2016-03-04 ENCOUNTER — Telehealth: Payer: Self-pay

## 2016-03-04 ENCOUNTER — Telehealth: Payer: Self-pay | Admitting: Oncology

## 2016-03-04 NOTE — Telephone Encounter (Signed)
-----   Message from Gordy Levan, MD sent at 02/28/2016 11:24 AM EDT ----- Labs seen and need follow up: please call husband, ask if he wants results of marker, if so can tell him that it is somewhat more elevated, so repeat scans as planned appropriate. Can tell him the value if he wants, or just this information. OK not to reorder [facility] if husband has not done that.

## 2016-03-04 NOTE — Telephone Encounter (Signed)
Faxed pt last office note to Dr. Nadara Mustard [phone]

## 2016-03-06 NOTE — Telephone Encounter (Signed)
LM for Mr Moroney to call back to Dr. Arlyss Queen nurse to discuss results of labs from his wife's visit with Dr. Marko Plume on 02-27-16.

## 2016-03-09 NOTE — Telephone Encounter (Signed)
Spoke with Jill Johnston and told him that Dr. Marko Plume said that the [facility] can be started on Wednesday 03-11-16.

## 2016-03-09 NOTE — Telephone Encounter (Signed)
The Pet Ct scan is scheduled for 03-16-16 at 0830.  This was the first available Ms Dunavan told her husband. Ms Cator nor Mr Herms were aware of the f/u appointment with Dr. Marko Plume on 03-13-16.  Ms Saldierna does not do well with early am appointments.   0930-1000 is better for her. Told husband that this nurse would call him to let him know if appointment on 03-13-16 needs to be r/s since Pet CT not until 03-16-16

## 2016-03-09 NOTE — Telephone Encounter (Signed)
"  I received a voicemail Friday and returning the call." Advised tumor marker elevated and scans PET, CT Chest/Abd/Pelvis are authorized so they can call to schedule.  He has ordered the Ibrance reporting low co-pay and to resume on Wednesday of this week.  Message left for collaborative in reference to [place].

## 2016-03-13 ENCOUNTER — Ambulatory Visit: Payer: BLUE CROSS/BLUE SHIELD | Admitting: Oncology

## 2016-03-13 ENCOUNTER — Telehealth: Payer: Self-pay | Admitting: Oncology

## 2016-03-13 ENCOUNTER — Other Ambulatory Visit: Payer: BLUE CROSS/BLUE SHIELD

## 2016-03-13 NOTE — Telephone Encounter (Signed)
Spoke with pt to confirm 9/21 appt per LL los

## 2016-03-16 ENCOUNTER — Encounter (HOSPITAL_COMMUNITY): Payer: Self-pay | Admitting: Radiology

## 2016-03-16 ENCOUNTER — Ambulatory Visit (HOSPITAL_COMMUNITY)
Admission: RE | Admit: 2016-03-16 | Discharge: 2016-03-16 | Disposition: A | Discharge status: Home / Self Care | Payer: BLUE CROSS/BLUE SHIELD | Source: Ambulatory Visit | Attending: Oncology | Admitting: Oncology

## 2016-03-16 DIAGNOSIS — C50912 Malignant neoplasm of unspecified site of left female breast: Secondary | ICD-10-CM

## 2016-03-16 DIAGNOSIS — C7951 Secondary malignant neoplasm of bone: Secondary | ICD-10-CM | POA: Insufficient documentation

## 2016-03-16 DIAGNOSIS — J9811 Atelectasis: Secondary | ICD-10-CM | POA: Insufficient documentation

## 2016-03-16 DIAGNOSIS — C787 Secondary malignant neoplasm of liver and intrahepatic bile duct: Secondary | ICD-10-CM | POA: Diagnosis not present

## 2016-03-16 DIAGNOSIS — C50919 Malignant neoplasm of unspecified site of unspecified female breast: Secondary | ICD-10-CM | POA: Diagnosis present

## 2016-03-16 DIAGNOSIS — R911 Solitary pulmonary nodule: Secondary | ICD-10-CM | POA: Diagnosis not present

## 2016-03-16 LAB — GLUCOSE, CAPILLARY: Glucose-Capillary: 113 mg/dL — ABNORMAL HIGH (ref 65–99)

## 2016-03-16 MED ORDER — IOPAMIDOL (ISOVUE-300) INJECTION 61%
100.0000 mL | Freq: Once | INTRAVENOUS | Status: AC | PRN
  Administered 2016-03-16: 100 mL via INTRAVENOUS

## 2016-03-16 MED ORDER — IOPAMIDOL (ISOVUE-300) INJECTION 61%
15.0000 mL | Freq: Once | INTRAVENOUS | Status: AC | PRN
  Administered 2016-03-16: 15 mL via ORAL

## 2016-03-16 MED ORDER — FLUDEOXYGLUCOSE F - 18 (FDG) INJECTION: 7.9000 | Freq: Once | INTRAVENOUS | Status: DC | PRN

## 2016-03-18 ENCOUNTER — Other Ambulatory Visit: Payer: Self-pay | Admitting: Oncology

## 2016-03-19 ENCOUNTER — Other Ambulatory Visit (HOSPITAL_BASED_OUTPATIENT_CLINIC_OR_DEPARTMENT_OTHER): Payer: BLUE CROSS/BLUE SHIELD

## 2016-03-19 ENCOUNTER — Other Ambulatory Visit: Payer: Self-pay | Admitting: Oncology

## 2016-03-19 ENCOUNTER — Ambulatory Visit (HOSPITAL_BASED_OUTPATIENT_CLINIC_OR_DEPARTMENT_OTHER): Payer: BLUE CROSS/BLUE SHIELD | Admitting: Oncology

## 2016-03-19 ENCOUNTER — Other Ambulatory Visit: Payer: Self-pay

## 2016-03-19 ENCOUNTER — Encounter: Payer: Self-pay | Admitting: Oncology

## 2016-03-19 VITALS — BP 138/82 | HR 72 | Temp 98.8°F | Resp 18 | Ht 65.0 in | Wt 160.0 lb

## 2016-03-19 DIAGNOSIS — K2100 Gastro-esophageal reflux disease with esophagitis, without bleeding: Secondary | ICD-10-CM

## 2016-03-19 DIAGNOSIS — I890 Lymphedema, not elsewhere classified: Secondary | ICD-10-CM

## 2016-03-19 DIAGNOSIS — C50912 Malignant neoplasm of unspecified site of left female breast: Secondary | ICD-10-CM

## 2016-03-19 DIAGNOSIS — K219 Gastro-esophageal reflux disease without esophagitis: Secondary | ICD-10-CM

## 2016-03-19 DIAGNOSIS — C787 Secondary malignant neoplasm of liver and intrahepatic bile duct: Secondary | ICD-10-CM

## 2016-03-19 DIAGNOSIS — C50919 Malignant neoplasm of unspecified site of unspecified female breast: Secondary | ICD-10-CM

## 2016-03-19 DIAGNOSIS — C7800 Secondary malignant neoplasm of unspecified lung: Secondary | ICD-10-CM

## 2016-03-19 DIAGNOSIS — D702 Other drug-induced agranulocytosis: Secondary | ICD-10-CM

## 2016-03-19 DIAGNOSIS — C7802 Secondary malignant neoplasm of left lung: Secondary | ICD-10-CM

## 2016-03-19 DIAGNOSIS — C7951 Secondary malignant neoplasm of bone: Principal | ICD-10-CM

## 2016-03-19 DIAGNOSIS — G893 Neoplasm related pain (acute) (chronic): Secondary | ICD-10-CM

## 2016-03-19 LAB — COMPREHENSIVE METABOLIC PANEL
ALBUMIN: 3.3 g/dL — AB (ref 3.5–5.0)
ALK PHOS: 117 U/L (ref 40–150)
ALT: 57 U/L — AB (ref 0–55)
ANION GAP: 9 meq/L (ref 3–11)
AST: 55 U/L — ABNORMAL HIGH (ref 5–34)
BILIRUBIN TOTAL: 0.45 mg/dL (ref 0.20–1.20)
BUN: 15.5 mg/dL (ref 7.0–26.0)
CALCIUM: 9.3 mg/dL (ref 8.4–10.4)
CO2: 26 mEq/L (ref 22–29)
CREATININE: 1 mg/dL (ref 0.6–1.1)
Chloride: 104 mEq/L (ref 98–109)
EGFR: 64 mL/min/{1.73_m2} — AB (ref >90)
Glucose: 111 mg/dl (ref 70–140)
Potassium: 4.5 mEq/L (ref 3.5–5.1)
Sodium: 139 mEq/L (ref 136–145)
TOTAL PROTEIN: 7.1 g/dL (ref 6.4–8.3)

## 2016-03-19 LAB — CBC WITH DIFFERENTIAL/PLATELET
BASO%: 3 % — AB (ref 0.0–2.0)
Basophils Absolute: 0.1 10*3/uL (ref 0.0–0.1)
EOS ABS: 0 10*3/uL (ref 0.0–0.5)
EOS%: 2 % (ref 0.0–7.0)
HEMATOCRIT: 36.6 % (ref 34.8–46.6)
HGB: 12 g/dL (ref 11.6–15.9)
LYMPH#: 0.6 10*3/uL — AB (ref 0.9–3.3)
LYMPH%: 30.5 % (ref 14.0–49.7)
MCH: 34.5 pg — ABNORMAL HIGH (ref 25.1–34.0)
MCHC: 32.8 g/dL (ref 31.5–36.0)
MCV: 105.2 fL — AB (ref 79.5–101.0)
MONO#: 0.2 10*3/uL (ref 0.1–0.9)
MONO%: 8.9 % (ref 0.0–14.0)
NEUT%: 55.6 % (ref 38.4–76.8)
NEUTROS ABS: 1.1 10*3/uL — AB (ref 1.5–6.5)
PLATELETS: 242 10*3/uL (ref 145–400)
RBC: 3.48 10*6/uL — ABNORMAL LOW (ref 3.70–5.45)
RDW: 13.8 % (ref 11.2–14.5)
WBC: 2 10*3/uL — AB (ref 3.9–10.3)

## 2016-03-19 MED ORDER — DENOSUMAB 120 MG/1.7ML ~~LOC~~ SOLN
120.0000 mg | Freq: Once | SUBCUTANEOUS | Status: AC
  Administered 2016-03-19: 120 mg via SUBCUTANEOUS
  Filled 2016-03-19: qty 1.7

## 2016-03-19 NOTE — Progress Notes (Signed)
OFFICE PROGRESS NOTE   March 19, 2016   Physicians:Darovsky, Mickel Duhamel [place], [place] Muss, Ardean Larsen) , gyn in [place] No PCP  INTERVAL HISTORY:   Patient is seen, together with husband, for discussion of restaging scans and treatment change for metastatic breast cancer. She had CT CAP and PET 03-16-16, which unfortunagely show progression in lung, liver and bone. She has been on letrozole and palbociclib since 09-2014, and xgeva. Last xgeva was 01-23-16, held late Aug following the dental procedure on 02-24-16.  She resumed Ibrance 03-11-16.  Patient has some ongoing soreness left lateral chest for last few weeks; she denies new or different pain otherwise tho she is generally stiff in AMs which may be in part due to letrozole. She denies SOB, cough, fever. Appetite is decreased from baseline due to marked reflux,  despite increase in protonix to bid by Dr Olevia Perches office in 12-2015. Bowels move regularly with prn miralax, which she usually takes ~ qod. She has been able to play tennis, including 3 hrs this week. No HA. No discomfort at all where she had root canal and dental cap right upper tooth. No other dental symptoms. No bleeding. No swelling LE. Bladder ok. No residual peripheral neuropathy hands or feet from adjuvant taxol, tho she had neuropathy symptoms during that treatment. She has not needed any breakthru pain medication in days, continues sustained release. Remainder of 10 point Review of Systems negative.    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 The malignancy was ER + 100%, PR 1%, HER 2 IHC/FISH negative, Ki67 52%, BRCA 1/2 negative, initial CA 2729 WNL. She had neoadjuvant AC x4 taxol x12 dose dense  from 06-10-2007 thru 09-28-2007. 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 She had radiation to chest wall, supraclavicular region and left axilla 50.4 Gy with boost to mastectomy scar to 60.4 Gy, by Dr [name] from 12-15-2007 thru 01-31-2008. She was on tamoxifen from 02-20-2008 thru 09-25-2014. She developed pain in ribs 08-2014, with CA 2729 52 and PET CT and MRI with diffuse bone metastatic disease, no cord compression, small lung lesions and intrathoracic adenopathy. 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 She began Denosumab 120 mcg monthly starting 4-7-201, and letrozole + palbociclib beginning4-12-2014. She was seen in consultation by Dr [name] [phone]. Palbociclib dose decreased to 100 mg daily x 21 q 28 days due to neutropenia. CBC on 10-25-15: WBC 2.1, Hgb 13, plt 186, ANC 0.9 Most recent PET was 09-25-2014 in [facility]; most recent imaging was CT CAP at [facility] 10-25-15 with no clear pulmonary involvement, stable 2 cm lesion at dome of liver and 11 mm left hepatic lobe lesion. She had right tomo mammogram at [facility] 09-17-15, with heterogeneously dense breast tissue but no other mammographic findings of concern.  CA 2729 has recently shown some increase, from 38 in 08-2015 to 49 in April 2017 and 65 in May 2017. She had evaluation for vaginal bleeding "from polyp", follows yearly with gynecologist in [place], up to date.   Objective:  Vital signs in last 24 hours:  BP  138/82 (BP Location: Right Arm, Patient Position: Sitting)   Pulse 72   Temp 98.8 F (37.1 C) (Oral)   Resp 18   Ht 5' 5"  (1.651 m)   Wt 160 lb (72.6 kg)   SpO2 99%   BMI 26.63 kg/m  Weight stable Alert, oriented and appropriate. Ambulatory without difficulty, easily able to get on and off exam table  No  alopecia  HEENT:PERRL, sclerae not icteric. Oral mucosa moist without lesions, posterior pharynx clear. Atrea of dental work right upper not tender and nothing of visual concern.  Neck supple. No JVD.  Lymphatics:no cervical,supraclavicular, axillary adenopathy Resp: clear to auscultation bilaterally and normal percussion bilaterally Cardio: regular rate and rhythm. No gallop. GI: soft, nontender, not distended, no appreciable mass or organomegaly. Some bowel sounds. Musculoskeletal/ Extremities:UE/ LE  without pitting edema, cords, tenderness Neuro: no peripheral neuropathy. Otherwise nonfocal. PSYCH appropriate mood and affect Skin without rash, ecchymosis, petechiae   Lab Results:  Results for orders placed or performed in visit on 03/19/16  CBC with Differential  Result Value Ref Range   WBC 2.0 (L) 3.9 - 10.3 10e3/uL   NEUT# 1.1 (L) 1.5 - 6.5 10e3/uL   HGB 12.0 11.6 - 15.9 g/dL   HCT 36.6 34.8 - 46.6 %   Platelets 242 145 - 400 10e3/uL   MCV 105.2 (H) 79.5 - 101.0 fL   MCH 34.5 (H) 25.1 - 34.0 pg   MCHC 32.8 31.5 - 36.0 g/dL   RBC 3.48 (L) 3.70 - 5.45 10e6/uL   RDW 13.8 11.2 - 14.5 %   lymph# 0.6 (L) 0.9 - 3.3 10e3/uL   MONO# 0.2 0.1 - 0.9 10e3/uL   Eosinophils Absolute 0.0 0.0 - 0.5 10e3/uL   Basophils Absolute 0.1 0.0 - 0.1 10e3/uL   NEUT% 55.6 38.4 - 76.8 %   LYMPH% 30.5 14.0 - 49.7 %   MONO% 8.9 0.0 - 14.0 %   EOS% 2.0 0.0 - 7.0 %   BASO% 3.0 (H) 0.0 - 2.0 %  Comprehensive metabolic panel  Result Value Ref Range   Sodium 139 136 - 145 mEq/L   Potassium 4.5 3.5 - 5.1 mEq/L   Chloride 104 98 - 109 mEq/L   CO2 26 22 - 29 mEq/L   Glucose 111 70 - 140 mg/dl   BUN 15.5 7.0 - 26.0 mg/dL   Creatinine 1.0 0.6 - 1.1 mg/dL   Total Bilirubin 0.45 0.20 - 1.20 mg/dL   Alkaline Phosphatase 117 40 - 150 U/L   AST 55 (H) 5 - 34 U/L   ALT 57 (H) 0 - 55 U/L   Total Protein 7.1 6.4 - 8.3 g/dL   Albumin 3.3 (L) 3.5 - 5.0 g/dL   Calcium 9.3 8.4 - 10.4 mg/dL   Anion Gap 9 3 -  11 mEq/L   EGFR 64 (L) >90 ml/min/1.73 m2     Studies/Results:  EXAM: CT CHEST, ABDOMEN, AND PELVIS WITH CONTRAST 03-16-16  COMPARISON:  10/25/2015  FINDINGS: CT CHEST FINDINGS  Cardiovascular: The heart size is normal.  No pericardial effusion.  Mediastinum/Nodes: The trachea appears patent and is midline. Normal appearance of the esophagus. No mediastinal or hilar adenopathy.  Lungs/Pleura: No pleural effusion. New airspace consolidation involving the right middle lobe. Diffuse interlobular septal thickening is identified bilaterally, right greater than left. New from previous exam. 5 mm right upper lobe lung nodule is identified, image 32 series 4. New from previous exam. Stable 8 mm perifissural nodule within the left lung, image 69  of series 4.  Musculoskeletal: Extensive multifocal sclerotic and lytic bone metastases are again identified. The CT appearance of the thoracic spine is not significantly changed in the interval. There is progressive destructive changes involving the posterior aspect of the right eighth rib, image number 85 of series 4. There is also progressive lytic changes involving the lateral aspect of the right fifth rib, image 61 of series 4. Progressive destructive bone changes are noted involving the lateral aspect of the left sixth and seventh ribs, image 81 of series 4.  CT ABDOMEN PELVIS FINDINGS  Hepatobiliary: Multifocal liver metastases are noted and appear new from previous exam. Index lesion along the dome of liver is new from previous exam measuring 2.8 cm, image 40 of series 2. Index lesion within the posterior right lobe of liver measures 5.2 cm, image 49 of series 2. Lateral segment of left lobe of liver lesion measures 3 cm, image 49 of series 2.  Pancreas: Unremarkable. No pancreatic ductal dilatation or surrounding inflammatory changes.  Spleen: Normal in size without focal abnormality.  Adrenals/Urinary Tract:  Adrenal glands are unremarkable. Kidneys are normal, without renal calculi, focal lesion, or hydronephrosis. Bladder is unremarkable.  Stomach/Bowel: Stomach is within normal limits. No evidence of bowel wall thickening, distention, or inflammatory changes. Appendix appears normal.  Vascular/Lymphatic: No significant vascular findings are present. No enlarged abdominal or pelvic lymph nodes.  Reproductive: Uterus and bilateral adnexa are unremarkable.  Other: No abdominal wall hernia or abnormality. No abdominopelvic ascites.  Musculoskeletal: Extensive multifocal bone metastases are again noted.  IMPRESSION: 1. Interval development of multifocal liver metastases. 2. There is new interlobular septal thickening within the lungs, suspicious for lymphangitic spread of tumor. Also new is consolidation in atelectasis of the right middle lobe. 3. Progression of destructive lesions involving bilateral ribs. The CT appearance of the spinal metastasis and pelvic metastasis appears similar to previous exam.    NUCLEAR MEDICINE PET SKULL BASE TO THIGH   03-16-16  COMPARISON:  PET-CT 09/25/2014  FINDINGS: NECK  No hypermetabolic lymph nodes in the neck.  CHEST  There is airspace consolidation involving the right middle lobe. There is associated increased uptake within this area with an SUV max equal to 4.79. Ground-glass attenuation and interlobular septal thickening within the right upper lobe is identified. There is also mild interlobular septal thickening involving the lung bases right greater than left. Nodule along the oblique fissure of the left lung measures 8 mm, image 28 of series 7. SUV max equals 1.85. This is unchanged in size when compared with the previous exam. No hypermetabolic mediastinal or hilar lymph nodes.  ABDOMEN/PELVIS  There is diffuse hepatic steatosis. Index lesion within the dome of liver measures 2.8 cm and has an SUV max of 12.79.  This is a new finding compared with previous PET-CT. Index lesion within the lateral segment of left lobe measures 3.3 cm and has an SUV max equal to 11.08. Also new from previous PET-CT. Lesion in the posterior right lobe of liver measures 4.4 cm and has an SUV max equal to 12.85. Also new from previous exam. No abnormal uptake identified within the pancreas, spleen, adrenal glands. No hypermetabolic lymph nodes identified within the abdomen or pelvis.  SKELETON  Widespread hypermetabolic bone metastases are identified throughout the axial and proximal appendicular skeleton. The extensive hypermetabolic bone metastases appears markedly progressed from previous exam. Increased uptake within L4 vertebra has an SUV max equal to 13.8. Previously 6.6. Increased radiotracer uptake within the right iliac wing  has an SUV max equal to 11.7. Previously 4.6.  IMPRESSION: 1. Since the previous PET-CT there has been marked interval progression of extensive hypermetabolic bone metastases. 2. Interval development of multifocal hypermetabolic liver metastases. The liver lesions appear new when compared with the previous PET-CT as well as the previous CT of the chest abdomen pelvis from 10/25/2015. 3. There is areas of interlobular septal thickening within both lungs. Findings suspicious for lymphangitic spread of tumor. 4. Consolidation and atelectasis of the right middle lobe.  PACs images reviewed with husband at time of visit; patient preferred not to see these.  Medications: I have reviewed the patient's current medications. She will stop letrozole and Ibrance.  Teaching done for [facility] and for Gemzar, no decision made between these at time of visit.   DISCUSSION Information from the restaging CT CAP and PET discussed, unfortunately progression in lung with imaging concern for lymphangitic spread, and liver as well as bone. Note CT was compared with most recent 09-2015 and PET with  imaging from a year ago.  Prior to visit, I reviewed case in detail with my partner in breast oncology. No studies presently available for her at [facility]. Options include q 3 week taxol or abraxane (since no neuropathy out from adjuvant taxol now), eribulin (Halaven) or gemzar. The Halaven or the gemzar would be day 1 day 8 every 21 days for ~ 3 cycles then could decrease to every other week if getting good response, possibly changing to faslodex alone after ~ 6 cycles also if good response.  With patient and husband, I have discussed rationale for changing to chemotherapy now given the lung and liver findings. Patient and husband have talked about the difficulty she had with adjuvant chemotherapy, particularly severe nausea and fatigue. We have discussed taxol/ abraxane, gemzar and halaven. Patient prefers not to try taxane now, so has had teaching for both gemzar and [place]. She is in agreement with PAC by IR. They will consider the information and let me know if they have other questions, and will let me know which of those agents is preferred. I have explained that we need insurance preauthorization, so hopefully they can let us hear back in next couple of days.  She gives verbal consent for chemo. She will need antiemetics. She does not recall what antiemetics were tried with adjuvant therapy in 2008.   Patient does not want to wait any longer out from dental procedure to continue xgeva, that given today.   Recommended genetics counseling and consideration of MSI and BRCA testing, in addition to other recommendations from that staff. Patient and husband understand that this information could identify other targeted agents that might be useful later.  Patient and husband request referral to GI in [place]. I have spoken directly with [facility] and with [facility]. I left message with Dr Olevia Perches office requesting records, as both of these GI offices need to review prior to making appointment; however  subsequently we learned that Dr Olevia Perches office information is in [place]. Will follow up with GI, hopefully prompt appointment, APP fine to start, as the severe GERD will likely make chemo nausea more difficult to manage.   Assessment/Plan:  1.Metastatic breast cancer extensively involving bone: initial diagnosis at age 13 in 04-2007, T2N1Mx ER + HER 2 -, treated with neoadjuvant AC taxol, mastectomy with node evaluation, radiation, tamoxifen x 7 years. Since recurrence 08-2014, she has had denosumab monthly, letrozole + palbociclib (dose reduced due to neutropenia) all begun 09-2014. Marker increasing  and CT/PET confirm progression now lung, liver, bones. Expect to begin chemo with either Halaven or gemzar in 1-2 weeks.  2.GERD which has been symptomatic including nocturnal aspiration. Will follow up with GI referral as above 3.Lymphedema LUE: stable per patient. May want lymphedema PT evaluation at some point. 4.needs PAC for chemo, requested with IR 5.up to date on gyn exams, patient does not recall name ofMD in [place] 6.BRCA testing negative in 2008. Will ask genetics counselors to review, consider MSI 7.Root canal and dental work for broken tooth 02-24-16. Aaron Edelman resumed 8.no advance directives, patient declined information  9.pain related to metastatic breast cancer: adequately controlled with present oxycontin. Note AM stiffness may also be related to letrozole. 10. Drug induced leukopenia/ neutropenia related to ibrance. Hopefully counts will improve over next 1-2 weeks prior to start of next chemo. 11.post cholecystectomy. 12.needs flu vaccine, which she agrees to have on 03-23-16 since xgeva today.  All questions answered. Time spent 45 min including >50% counseling and coordination of care. Chemo orders will be placed when decision made, tentatively to begin in next 7-10- days.   Jill Clines, MD   03/19/2016, 3:22 PM

## 2016-03-20 ENCOUNTER — Other Ambulatory Visit: Payer: Self-pay | Admitting: Oncology

## 2016-03-20 ENCOUNTER — Telehealth: Payer: Self-pay

## 2016-03-20 DIAGNOSIS — K2100 Gastro-esophageal reflux disease with esophagitis, without bleeding: Secondary | ICD-10-CM | POA: Insufficient documentation

## 2016-03-20 DIAGNOSIS — C7800 Secondary malignant neoplasm of unspecified lung: Secondary | ICD-10-CM

## 2016-03-20 DIAGNOSIS — C50919 Malignant neoplasm of unspecified site of unspecified female breast: Secondary | ICD-10-CM | POA: Insufficient documentation

## 2016-03-20 DIAGNOSIS — C787 Secondary malignant neoplasm of liver and intrahepatic bile duct: Secondary | ICD-10-CM

## 2016-03-20 NOTE — Telephone Encounter (Signed)
S/w [facility] and a consult to them would not be available until late Novemver. [facility] has a new MD so a referral to them would be about 2 weeks if they accept her as a patient. Referall was sent to [facility]. Then I received in basket from [place] that they contacted pt and she was unaware of referral. Pt has seen Dr Laural Golden in [place]. Called pt and asked her to let us know her preference and we will facilitate this.

## 2016-03-22 ENCOUNTER — Encounter: Payer: Self-pay | Admitting: Radiation Oncology

## 2016-03-22 NOTE — Progress Notes (Signed)
  Radiation Oncology         [phone]) [phone] ________________________________  Name: Jill Johnston MRN: [id number]  Date: 03/22/2016  DOB: [birth year]  Chart Note:  I was notified from a mutual friend that this patient was diagnosed with brain metastases in [place], [place], this weekend.  She reportedly had an MRI showing frontal lesions and was started on Keppra and Decadron by the Emergency Dept.  I have instructed her to present to our clinic with disks from her MRI tomorrow (9/25) at 11:30 am for the nurses to see me at noon.  I don't have details regarding the MRI findings.  I'll look forward to reviewing the MRI images and report.  If she appears to have brain mets, we'll discuss treatment options, which may include whole brain radiotherapy or SRS.  ________________________________  Sheral Apley [name], M.D.

## 2016-03-23 ENCOUNTER — Ambulatory Visit
Admission: RE | Admit: 2016-03-23 | Discharge: 2016-03-23 | Disposition: A | Discharge status: Home / Self Care | Payer: BLUE CROSS/BLUE SHIELD | Source: Ambulatory Visit | Attending: Radiation Oncology | Admitting: Radiation Oncology

## 2016-03-23 ENCOUNTER — Other Ambulatory Visit: Payer: BLUE CROSS/BLUE SHIELD

## 2016-03-23 ENCOUNTER — Encounter: Payer: Self-pay | Admitting: Radiation Oncology

## 2016-03-23 ENCOUNTER — Ambulatory Visit: Payer: BLUE CROSS/BLUE SHIELD

## 2016-03-23 VITALS — BP 141/95 | HR 73 | Resp 18 | Ht 65.0 in | Wt 160.3 lb

## 2016-03-23 DIAGNOSIS — C50912 Malignant neoplasm of unspecified site of left female breast: Secondary | ICD-10-CM | POA: Insufficient documentation

## 2016-03-23 DIAGNOSIS — C7949 Secondary malignant neoplasm of other parts of nervous system: Secondary | ICD-10-CM | POA: Insufficient documentation

## 2016-03-23 DIAGNOSIS — C7931 Secondary malignant neoplasm of brain: Secondary | ICD-10-CM

## 2016-03-23 DIAGNOSIS — Z510 Encounter for antineoplastic radiation therapy: Secondary | ICD-10-CM | POA: Insufficient documentation

## 2016-03-23 DIAGNOSIS — Z170 Estrogen receptor positive status [ER+]: Secondary | ICD-10-CM | POA: Diagnosis not present

## 2016-03-23 MED ORDER — DEXAMETHASONE 4 MG PO TABS: ORAL_TABLET | ORAL | 0 refills | Status: DC

## 2016-03-23 MED ORDER — LEVETIRACETAM 1000 MG PO TABS: 500.0000 mg | ORAL_TABLET | Freq: Two times a day (BID) | ORAL | 3 refills | Status: DC

## 2016-03-23 NOTE — Progress Notes (Signed)
See progress note under physician encounter. 

## 2016-03-23 NOTE — Progress Notes (Signed)
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 Since recurrence 08-2014, she has had denosumab monthly, letrozole + palbociclib (dose reduced due to neutropenia) all begun 09-2014. Marker increasing and CT/PET confirm progression now lung, liver, bones. Expect to begin chemo with either Halaven or gemzar in 1-2 weeks.   Dose of Decadron, if applicable: yes, decadron 6 mg qid  Recent neurologic symptoms, if any:   Seizures: yes  Headaches: left side headaches x several weeks; reports headaches began as intermittent than became constant, reports since starting decadron headache intensity is less  Nausea: episode of nausea last night related to reflux  Dizziness/ataxia: since starting keppra  Difficulty with hand coordination: difficulty with right hand coordination during seizures only  Focal numbness/weakness: no  Visual deficits/changes: no  Confusion/Memory deficits: no  Painful bone metastases at present, if any: yes, ribs (left). Reports she took her pain medication just prior to this appointment thus, she has no pain presently. Patient had long acting pain medication and short but, only takes long acting.  SAFETY ISSUES:  Prior radiation? Yes Chest wall,supraclavicular region and left axilla,50.4 GY with boost to mastecyomy scar to toal 60.4Gy from 12/15/2007-01/31/2008 01/10/15-01/23/15 sacrum and femurs 30Gy/42fx  Pacemaker/ICD?  no  Possible current pregnancy? no  Is the patient on methotrexate? no  Additional Complaints / other details: 50 year old female. Denies diplopia or ringing in the ears.

## 2016-03-23 NOTE — Progress Notes (Signed)
Radiation Oncology         (336) [phone] ________________________________  Name: Jill Johnston MRN: [id number]  Date: 03/23/2016  DOB: 31-Aug-1965    Re-Consulation Visit Note  CC: DAROVSKY,BORIS, MD  Jill Height, MD  Diagnosis:   50 y.o. 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  Interval Since Last Radiation: 14 months  01/10/15-01/23/15: Metastatic deposits in the sacrum and femurs were conformally treated to 30 Gy in 10 fractions of 3 Gy  12/15/07-01/31/08: 50.4 Gy to the chest wall with supraclavicular and left axillary field with a boost to the mastectomy scar to total 60.4 Gy  Narrative:  The patient returns for a re-consulation. In summary the patient completed radiotherapy a year ago because of symptomatic metastatic disease in the sacrum and bilateral femurs. She has been under the care of Dr. Marko Johnston since Dr. Jacquiline Johnston is no longer practicing in [place]. Her systemic therapy has been Letrozole and Ibrance, but is now considering either Taxol/abraxane, Halaven, or Gemzar therapy. She was in [place] last weekend and was diagnosed with dual based lesion along the left parietal region. Per the techs, there were concerns for carcinomatosis of the left parietal region on her brain MRI, on 03/21/16. She was transported to the ED by ambulance for right sided face numbness and right sided hemiparesis. In the ED she was started on Keppra and Decadron. The patient has been instructed to present to the clinic today with disks of her MRI.  On review of systems, the patient reports that she is doing well overall. She denies any chest pain, shortness of breath, cough, fevers, chills, night sweats, unintended weight changes. She denies any bowel or bladder disturbances and denies abdominal pain. The patient reports headaches, dizziness, reflux, and nausea. She denies diplopia or ringing in the ears. She  denies any new musculoskeletal or joint aches or pains. A complete review of systems is obtained and is otherwise negative.   Past Medical History:  Past Medical History:  Diagnosis Date  . Anxiety   . Breast cancer ([place])    Stage IV, ER positive left upper outer quadrant breast cancer with metastasis to bone  . GERD (gastroesophageal reflux disease)     Past Surgical History: Past Surgical History:  Procedure Laterality Date  . CHOLECYSTECTOMY    . ESOPHAGOGASTRODUODENOSCOPY (EGD) WITH PROPOFOL N/A 07/12/2015   Procedure: ESOPHAGOGASTRODUODENOSCOPY (EGD) WITH PROPOFOL;  Surgeon: Rogene Houston, MD;  Location: [facility];  Service: Endoscopy;  Laterality: N/A;  1:00  . MASTECTOMY  April 2009   Left breast with lymph node resection    Social History:  Social History   Social History  . Marital status: Married    Spouse name: N/A  . Number of children: N/A  . Years of education: N/A   Occupational History  . Not on file.   Social History Main Topics  . Smoking status: Never Smoker  . Smokeless tobacco: Never Used  . Alcohol use No  . Drug use: No  . Sexual activity: Yes   Other Topics Concern  . Not on file   Social History Narrative  . No narrative on file  The patient is a Biochemist, clinical. She lives in [place].  Family History: Family History  Problem Relation Age of Onset  . Heart attack Mother     Died age 46  . Heart disease Father     Died age 3  .  Brain cancer Father     ALLERGIES:  has No Known Allergies.  Meds: Current Outpatient Prescriptions  Medication Sig Dispense Refill  . dexamethasone (DECADRON) 6 MG tablet Take 6 mg by mouth 4 (four) times daily.    Marland Kitchen levETIRAcetam (KEPPRA) 1000 MG tablet Take by mouth 2 (two) times daily.     Marland Kitchen lisinopril (PRINIVIL,ZESTRIL) 10 MG tablet Take 1 tablet (10 mg total) by mouth daily. 30 tablet 0  . oxyCODONE (OXYCONTIN) 30 MG 12 hr tablet Take 30 mg by mouth 2 (two) times daily. 60 each 0  .  pantoprazole (PROTONIX) 40 MG tablet Take 1 tablet (40 mg total) by mouth 2 (two) times daily before a meal. 60 tablet 3  . polyethylene glycol (MIRALAX / GLYCOLAX) packet Take 17 grams by mouth every day.    . Calcium Carb-Cholecalciferol (CALCIUM + D3) 600-200 MG-UNIT TABS Take 1 tablet by mouth daily.     Marland Kitchen letrozole (FEMARA) 2.5 MG tablet Reported on 01/06/2016  9  . LORazepam (ATIVAN) 0.5 MG tablet Take 1-2 tablets (0.5-1 mg total) by mouth every 6 (six) hours as needed for anxiety (or nausea). (Patient not taking: Reported on 03/23/2016) 10 tablet 0  . Oxycodone HCl 10 MG TABS Take 1 tablet by mouth every 3 (three) hours as needed for pain.    . palbociclib (IBRANCE) 100 MG capsule Take 100 mg by mouth daily.      No current facility-administered medications for this encounter.     Physical Findings:  Johnston is 5\' 5"  (1.651 m) and weight is 160 lb 4.8 oz (72.7 kg). Her blood pressure is 141/95 (abnormal) and her pulse is 73. Her respiration is 18 and oxygen saturation is 100%.   In general this is a well appearing [name] in no acute distress. She is alert and oriented x4 and appropriate throughout the examination. HEENT reveals that the patient is normocephalic, atraumatic. EOMs are intact. PERRLA. Skin is intact without any evidence of gross lesions. Cardiovascular exam reveals a regular rate and rhythm, no clicks rubs or murmurs are auscultated. Chest is clear to auscultation bilaterally. Lymphatic assessment is performed and does not reveal any adenopathy in the cervical, supraclavicular, axillary, or inguinal chains. Abdomen has active bowel sounds in all quadrants and is intact. The abdomen is soft, non tender, non distended. Lower extremities are negative for pretibial pitting edema, deep calf tenderness, cyanosis or clubbing. Grossly neurologically intact. DTRs are equilateral in the lower extremities bilaterally.   Lab Findings: Lab Results  Component Value Date   WBC  2.0 (L) 03/19/2016   WBC 9.0 10/01/2014   HGB 12.0 03/19/2016   HCT 36.6 03/19/2016   PLT 242 03/19/2016    Lab Results  Component Value Date   NA 139 03/19/2016   K 4.5 03/19/2016   CHLORIDE 104 03/19/2016   CO2 26 03/19/2016   GLUCOSE 111 03/19/2016   BUN 15.5 03/19/2016   CREATININE 1.0 03/19/2016   BILITOT 0.45 03/19/2016   ALKPHOS 117 03/19/2016   AST 55 (H) 03/19/2016   ALT 57 (H) 03/19/2016   PROT 7.1 03/19/2016   ALBUMIN 3.3 (L) 03/19/2016   CALCIUM 9.3 03/19/2016   ANIONGAP 9 03/19/2016   Impression/Plan: 1. Metastatic left breast cancer with dural based disease in the brain. Dr. [name] reviews the patient's films from her MRI scan and discusses the options for treatment with radiotherapy. He recommends proceeding with either LP to discern leptomeningeal spread and if the CSF does not contain  malignancy, consider SRS radiotherapy, versus if malignancy is present in the CSF, whole brain radiotherapy, versus moving forward with whole brain radiotherapy without CSF sampling. He discusses the delivery and logistics for each optoin of radiotherapy, as well as outlines short and long-term effects related to risks and benefits of radiotherapy. At the end of the conversation, the patient is interested in moving forward with whole brain radiotherapy.  She is planning to proceed with systemic therapy under the care of Dr. Marko Johnston. I have refilled her Keppra and her Dexamethasone. She will stay on 4mg  of Dexamethasone q 6 hours. Next week we will taper to TID.   The above documentation reflects my direct findings during this shared patient visit. Please see the separate note by Dr. [name] on this date for the remainder of the patient's plan of care.     Carola Rhine, PAC  This document serves as a record of services personally performed by Shona Simpson, PA-C and [name], MD. It was created on their behalf by Darcus Austin, a trained medical scribe. The creation of  this record is based on the scribe's personal observations and the providers' statements to them. This document has been checked and approved by the attending provider.

## 2016-03-23 NOTE — Progress Notes (Signed)
  Radiation Oncology         [phone] ________________________________  Name: Jill Johnston MRN: [id number]  Date: 03/23/2016  DOB: October 20, 1965  SIMULATION AND TREATMENT PLANNING NOTE    ICD-9-CM ICD-10-CM   1. Cancer with leptomeningeal spread (HCC) 198.4 C79.49     DIAGNOSIS:   50 y.o. woman with progressive metastatic ER/PR positive breast cancer and symptomatic leptomeningeal carcinomatosis   NARRATIVE:  The patient was brought to the [facility].  Identity was confirmed.  All relevant records and images related to the planned course of therapy were reviewed.  The patient freely provided informed written consent to proceed with treatment after reviewing the details related to the planned course of therapy. The consent form was witnessed and verified by the simulation staff.  Then, the patient was set-up in a stable reproducible  supine position for radiation therapy.  CT images were obtained.  Surface markings were placed.  The CT images were loaded into the planning software.  Then the target and avoidance structures were contoured.  Treatment planning then occurred.  The radiation prescription was entered and confirmed.  Then, I designed and supervised the construction of a total of 3 medically necessary complex treatment devices, including a custom made thermoplastic mask used for immobilization and two complex multileaf collimators to cover the entire intracranial contents, while shielding the eyes and face.  Each [facility] is independently created to account for beam divergence.  The right and left lateral fields will be treated with 6 MV X-rays.  I have requested : Isodose Plan.    PLAN:  The whole brain will be treated to 30 Gy in 10 fractions.  ________________________________  Sheral Apley [name], M.D.  This document serves as a record of services personally performed by [name], MD. It was created on his behalf by Darcus Austin, a trained medical scribe. The  creation of this record is based on the scribe's personal observations and the provider's statements to them. This document has been checked and approved by the attending provider.

## 2016-03-25 DIAGNOSIS — Z510 Encounter for antineoplastic radiation therapy: Secondary | ICD-10-CM | POA: Diagnosis not present

## 2016-03-30 ENCOUNTER — Ambulatory Visit
Admission: RE | Admit: 2016-03-30 | Discharge: 2016-03-30 | Disposition: A | Discharge status: Home / Self Care | Payer: BLUE CROSS/BLUE SHIELD | Source: Ambulatory Visit | Attending: Radiation Oncology | Admitting: Radiation Oncology

## 2016-03-30 ENCOUNTER — Other Ambulatory Visit: Payer: Self-pay | Admitting: Oncology

## 2016-03-30 DIAGNOSIS — Z170 Estrogen receptor positive status [ER+]: Secondary | ICD-10-CM | POA: Diagnosis not present

## 2016-03-30 DIAGNOSIS — Z79899 Other long term (current) drug therapy: Secondary | ICD-10-CM | POA: Diagnosis not present

## 2016-03-30 DIAGNOSIS — Z808 Family history of malignant neoplasm of other organs or systems: Secondary | ICD-10-CM | POA: Diagnosis not present

## 2016-03-30 DIAGNOSIS — Z510 Encounter for antineoplastic radiation therapy: Secondary | ICD-10-CM | POA: Diagnosis present

## 2016-03-30 DIAGNOSIS — C50912 Malignant neoplasm of unspecified site of left female breast: Secondary | ICD-10-CM | POA: Diagnosis not present

## 2016-03-30 DIAGNOSIS — C7951 Secondary malignant neoplasm of bone: Secondary | ICD-10-CM | POA: Diagnosis not present

## 2016-03-30 DIAGNOSIS — C7949 Secondary malignant neoplasm of other parts of nervous system: Secondary | ICD-10-CM | POA: Diagnosis not present

## 2016-03-31 ENCOUNTER — Other Ambulatory Visit: Payer: BLUE CROSS/BLUE SHIELD

## 2016-03-31 ENCOUNTER — Ambulatory Visit
Admission: RE | Admit: 2016-03-31 | Discharge: 2016-03-31 | Disposition: A | Discharge status: Home / Self Care | Payer: BLUE CROSS/BLUE SHIELD | Source: Ambulatory Visit | Attending: Radiation Oncology | Admitting: Radiation Oncology

## 2016-03-31 ENCOUNTER — Ambulatory Visit: Payer: BLUE CROSS/BLUE SHIELD

## 2016-03-31 ENCOUNTER — Telehealth: Payer: Self-pay | Admitting: Oncology

## 2016-03-31 DIAGNOSIS — Z510 Encounter for antineoplastic radiation therapy: Secondary | ICD-10-CM | POA: Diagnosis not present

## 2016-03-31 NOTE — Telephone Encounter (Signed)
lvm to confirm appt 10/13 per LOS

## 2016-04-01 ENCOUNTER — Ambulatory Visit
Admission: RE | Admit: 2016-04-01 | Discharge: 2016-04-01 | Disposition: A | Discharge status: Home / Self Care | Payer: BLUE CROSS/BLUE SHIELD | Source: Ambulatory Visit | Attending: Radiation Oncology | Admitting: Radiation Oncology

## 2016-04-01 ENCOUNTER — Other Ambulatory Visit: Payer: Self-pay | Admitting: Radiation Oncology

## 2016-04-01 DIAGNOSIS — C7949 Secondary malignant neoplasm of other parts of nervous system: Secondary | ICD-10-CM

## 2016-04-01 DIAGNOSIS — C50912 Malignant neoplasm of unspecified site of left female breast: Secondary | ICD-10-CM

## 2016-04-01 DIAGNOSIS — C50312 Malignant neoplasm of lower-inner quadrant of left female breast: Secondary | ICD-10-CM

## 2016-04-01 DIAGNOSIS — C7951 Secondary malignant neoplasm of bone: Secondary | ICD-10-CM

## 2016-04-01 DIAGNOSIS — Z510 Encounter for antineoplastic radiation therapy: Secondary | ICD-10-CM | POA: Diagnosis not present

## 2016-04-01 MED ORDER — ONDANSETRON HCL 8 MG PO TABS: 8.0000 mg | ORAL_TABLET | Freq: Three times a day (TID) | ORAL | 0 refills | Status: DC | PRN

## 2016-04-01 MED ORDER — FIRST-DUKES MOUTHWASH MT SUSP: 15.0000 mL | Freq: Four times a day (QID) | OROMUCOSAL | 1 refills | Status: DC | PRN

## 2016-04-01 NOTE — Progress Notes (Signed)
  Radiation Oncology         [phone]   Name: Jill Johnston MRN: [id number]   Date: 04/01/2016  DOB: [birth year]   Weekly Radiation Therapy Management    ICD-9-CM ICD-10-CM   1. Cancer with leptomeningeal spread (HCC) 198.4 C79.49   2. Carcinoma of left breast metastatic to bone (HCC) 174.9 C50.912    198.5 C79.51     Current Dose: 9 Gy  Planned Dose:  30 Gy  Narrative The patient presents for a work in visit during treatment for whole brain radiotherapy. She complains of abdominal "upset" and describes episodes of indigestion. She has a history of GERD and PUD, and takes Protonix BID. She states though that she had a prescription for Dexamethasone 6mg  q 6 hours. We had discussed taper at her initial conference, however she has gone from 6 mg q 6 hours, to 6mg  BID the past two days, and 4 mg BID today. She describes lethargy during the day, insomnia at night, and more dizziness since making these changes. She reports she is taking Keppra 500 mg BID. She also describes difficulty with eating due to pain of her tongue and a coating that keeps her from enjoying the flavor of food.  Physical Findings Wt Readings from Last 3 Encounters:  04/01/16 157 lb 3.2 oz (71.3 kg)  03/23/16 160 lb 4.8 oz (72.7 kg)  03/19/16 160 lb (72.6 kg)   Temp Readings from Last 3 Encounters:  04/01/16 97.8 F (36.6 C) (Oral)  03/19/16 98.8 F (37.1 C) (Oral)  02/27/16 98.2 F (36.8 C) (Oral)   BP Readings from Last 3 Encounters:  04/01/16 115/75  03/23/16 (!) 141/95  03/19/16 138/82   Pulse Readings from Last 3 Encounters:  04/01/16 79  03/23/16 73  03/19/16 72   In general this is a well appearing [place] european female in no acute distress. She's alert and oriented x4 and appropriate throughout the examination. White appearing plaques are noted of the tongue consistent with candida. Cardiopulmonary assessment is negative for acute distress and she exhibits normal effort.    Impression/Plan 1.  Metastatic breast cancer to the brain. The patient is encouraged to continue radiotherapy and she will follow up with Dr. [name] later this week for her under treatment visit. She is also counseled on the rationale for a slower taper of her steroids to avoid adrenal crisis. She is given taper instructions, however through Friday she will take 4 mg TID, thereafter she will go to 4mg  BID for one week, then 2 mg BID for one week, then 2 mg every other day for one week then stop. A prescription for Zofran was also sent in to the pharmacy for her abdominal complaints. She understands to contact us if she has questions or concerns prior to her next visit. 2. Thrush. A prescription was sent in for [employer] Mouthwash per [facility] Board concentration. She will use this po swish and spit every 6 hours prn until her symptoms resolve.       Carola Rhine, PAC

## 2016-04-01 NOTE — Progress Notes (Signed)
Patient c/o nausea and only taking decadron 4mg  twice a day not 4mg  every 6 hours as directed, she states"That is too much, I have mushrooms in my mouth," tongue is coated white , doesn't have rx for nausea, ate salmon and potatoes for lunch, drinking water,   And herbal tea., , does shred real ginger root and makes hot tea, Feels poor statedtase buids feels awful 1:48 PM BP 115/75 (BP Location: Right Arm, Patient Position: Sitting, Cuff Size: Normal)   Pulse 79   Temp 97.8 F (36.6 C) (Oral)   Resp 16   Ht 5' 5.5" (1.664 m)   Wt 157 lb 3.2 oz (71.3 kg)   SpO2 98% Comment: room air  BMI 25.76 kg/m   Wt Readings from Last 3 Encounters:  04/01/16 157 lb 3.2 oz (71.3 kg)  03/23/16 160 lb 4.8 oz (72.7 kg)  03/19/16 160 lb (72.6 kg)

## 2016-04-02 ENCOUNTER — Ambulatory Visit
Admission: RE | Admit: 2016-04-02 | Discharge: 2016-04-02 | Disposition: A | Discharge status: Home / Self Care | Payer: BLUE CROSS/BLUE SHIELD | Source: Ambulatory Visit | Attending: Radiation Oncology | Admitting: Radiation Oncology

## 2016-04-02 DIAGNOSIS — Z510 Encounter for antineoplastic radiation therapy: Secondary | ICD-10-CM | POA: Diagnosis not present

## 2016-04-02 NOTE — Progress Notes (Signed)
Jill Johnston to nursing with report that she is unable to swallow the 1000 mg Keppra tabs and would like to have a script for the 500mg  tabs. She also reported that her insurance does not cover D[employer] Mouthwash.  Pharmacy [facility] - store [address] and [address] [phone]

## 2016-04-03 ENCOUNTER — Ambulatory Visit
Admission: RE | Admit: 2016-04-03 | Discharge: 2016-04-03 | Disposition: A | Discharge status: Home / Self Care | Payer: BLUE CROSS/BLUE SHIELD | Source: Ambulatory Visit | Attending: Radiation Oncology | Admitting: Radiation Oncology

## 2016-04-03 ENCOUNTER — Telehealth: Payer: Self-pay | Admitting: *Deleted

## 2016-04-03 ENCOUNTER — Encounter: Payer: Self-pay | Admitting: Radiation Oncology

## 2016-04-03 DIAGNOSIS — Z510 Encounter for antineoplastic radiation therapy: Secondary | ICD-10-CM | POA: Diagnosis not present

## 2016-04-03 NOTE — Progress Notes (Addendum)
[name] has received 5 fractions to the brain.  She reports frontal headache, blurred vision and dizziness at this time.  Denies any ataxia.  Thrush soft palate, tongue and buccal mucosal area.  To start fluconazole today.  Reports fatigue and she waits up during the night.   BP 117/87   Pulse 66   Temp 97.9 F (36.6 C) (Oral)   Ht 5' 5.5" (1.664 m)   Wt 156 lb 6.4 oz (70.9 kg)   BMI 25.63 kg/m     Wt Readings from Last 3 Encounters:  04/03/16 156 lb 6.4 oz (70.9 kg)  04/01/16 157 lb 3.2 oz (71.3 kg)  03/23/16 160 lb 4.8 oz (72.7 kg)

## 2016-04-03 NOTE — Telephone Encounter (Addendum)
Called in scripts for Gay Filler for fluconazole 100mg  #7 one tab po daily for 7 days with no refills, and a 30 day supply of her Keppra with 500 mg tablets, BID to replace the 1000 mg tabs which she was unable to swallow. .  Called her and left a voice message regarding medication changes.

## 2016-04-06 ENCOUNTER — Ambulatory Visit
Admission: RE | Admit: 2016-04-06 | Discharge: 2016-04-06 | Disposition: A | Discharge status: Home / Self Care | Payer: BLUE CROSS/BLUE SHIELD | Source: Ambulatory Visit | Attending: Radiation Oncology | Admitting: Radiation Oncology

## 2016-04-06 DIAGNOSIS — Z510 Encounter for antineoplastic radiation therapy: Secondary | ICD-10-CM | POA: Diagnosis not present

## 2016-04-07 ENCOUNTER — Ambulatory Visit
Admission: RE | Admit: 2016-04-07 | Discharge: 2016-04-07 | Disposition: A | Discharge status: Home / Self Care | Payer: BLUE CROSS/BLUE SHIELD | Source: Ambulatory Visit | Attending: Radiation Oncology | Admitting: Radiation Oncology

## 2016-04-07 DIAGNOSIS — Z510 Encounter for antineoplastic radiation therapy: Secondary | ICD-10-CM | POA: Diagnosis not present

## 2016-04-08 ENCOUNTER — Other Ambulatory Visit: Payer: Self-pay | Admitting: Oncology

## 2016-04-08 ENCOUNTER — Ambulatory Visit
Admission: RE | Admit: 2016-04-08 | Discharge: 2016-04-08 | Disposition: A | Discharge status: Home / Self Care | Payer: BLUE CROSS/BLUE SHIELD | Source: Ambulatory Visit | Attending: Radiation Oncology | Admitting: Radiation Oncology

## 2016-04-08 DIAGNOSIS — Z510 Encounter for antineoplastic radiation therapy: Secondary | ICD-10-CM | POA: Diagnosis not present

## 2016-04-09 ENCOUNTER — Ambulatory Visit
Admission: RE | Admit: 2016-04-09 | Discharge: 2016-04-09 | Disposition: A | Discharge status: Home / Self Care | Payer: BLUE CROSS/BLUE SHIELD | Source: Ambulatory Visit | Attending: Radiation Oncology | Admitting: Radiation Oncology

## 2016-04-09 ENCOUNTER — Telehealth: Payer: Self-pay

## 2016-04-09 DIAGNOSIS — C7949 Secondary malignant neoplasm of other parts of nervous system: Secondary | ICD-10-CM

## 2016-04-09 DIAGNOSIS — Z510 Encounter for antineoplastic radiation therapy: Secondary | ICD-10-CM | POA: Diagnosis not present

## 2016-04-09 MED ORDER — FLUCONAZOLE 100 MG PO TABS: 100.0000 mg | ORAL_TABLET | Freq: Every day | ORAL | 0 refills | Status: DC

## 2016-04-09 NOTE — Telephone Encounter (Signed)
-----   Message from Gordy Levan, MD sent at 04/08/2016  8:49 PM EDT ----- Regarding: diflucan Juliann Pulse- She is on decadron for new brain mets Per rad onc note has oral thrush Diflucan would probably be more helpful than the MMW that she has now, 100 mg daily x 7.   Please let husband know She will be at rad onc on 10-12, and I see her on 10-13 but would like to start prior to 10-13 if possible  Thank you Lennis

## 2016-04-09 NOTE — Progress Notes (Signed)
  Radiation Oncology         [phone]   Name: Jill Johnston MRN: [id number]   Date: 04/09/2016  DOB: June 23, 1966   Weekly Radiation Therapy Management    ICD-9-CM ICD-10-CM   1. Cancer with leptomeningeal spread (HCC) 198.4 C79.49     Current Dose: 27 Gy  Planned Dose:  30 Gy  Narrative The patient presents for routine under treatment assessment. She has diflucan for thrush and is getting decadron tapered.  The patient is without complaint. Set-up films were reviewed. The chart was checked.  Physical Findings No scalp epilation yet. Weight essentially stable.  No significant changes.  Impression The patient is tolerating radiation.  Plan Continue treatment as planned.         Sheral Apley [name], M.D.  This document serves as a record of services personally performed by [name], MD. It was created on his behalf by Maryla Morrow, a trained medical scribe. The creation of this record is based on the scribe's personal observations and the provider's statements to them. This document has been checked and approved by the attending provider.

## 2016-04-09 NOTE — Telephone Encounter (Signed)
S/w Legrand Como about thrush and diflucan daily x7. Rx escribed.

## 2016-04-10 ENCOUNTER — Encounter: Payer: Self-pay | Admitting: Radiation Oncology

## 2016-04-10 ENCOUNTER — Other Ambulatory Visit: Payer: Self-pay

## 2016-04-10 ENCOUNTER — Ambulatory Visit (HOSPITAL_BASED_OUTPATIENT_CLINIC_OR_DEPARTMENT_OTHER): Payer: BLUE CROSS/BLUE SHIELD | Admitting: Oncology

## 2016-04-10 ENCOUNTER — Other Ambulatory Visit (HOSPITAL_BASED_OUTPATIENT_CLINIC_OR_DEPARTMENT_OTHER): Payer: BLUE CROSS/BLUE SHIELD

## 2016-04-10 ENCOUNTER — Other Ambulatory Visit: Payer: Self-pay | Admitting: Oncology

## 2016-04-10 ENCOUNTER — Ambulatory Visit
Admission: RE | Admit: 2016-04-10 | Discharge: 2016-04-10 | Disposition: A | Discharge status: Home / Self Care | Payer: BLUE CROSS/BLUE SHIELD | Source: Ambulatory Visit | Attending: Radiation Oncology | Admitting: Radiation Oncology

## 2016-04-10 VITALS — BP 119/81 | HR 71 | Temp 97.9°F | Resp 18 | Ht 65.5 in | Wt 155.0 lb

## 2016-04-10 DIAGNOSIS — C7951 Secondary malignant neoplasm of bone: Principal | ICD-10-CM

## 2016-04-10 DIAGNOSIS — C7949 Secondary malignant neoplasm of other parts of nervous system: Secondary | ICD-10-CM

## 2016-04-10 DIAGNOSIS — I890 Lymphedema, not elsewhere classified: Secondary | ICD-10-CM

## 2016-04-10 DIAGNOSIS — C50212 Malignant neoplasm of upper-inner quadrant of left female breast: Secondary | ICD-10-CM

## 2016-04-10 DIAGNOSIS — G893 Neoplasm related pain (acute) (chronic): Secondary | ICD-10-CM

## 2016-04-10 DIAGNOSIS — C7800 Secondary malignant neoplasm of unspecified lung: Secondary | ICD-10-CM | POA: Diagnosis not present

## 2016-04-10 DIAGNOSIS — B370 Candidal stomatitis: Secondary | ICD-10-CM

## 2016-04-10 DIAGNOSIS — C787 Secondary malignant neoplasm of liver and intrahepatic bile duct: Secondary | ICD-10-CM | POA: Diagnosis not present

## 2016-04-10 DIAGNOSIS — C7802 Secondary malignant neoplasm of left lung: Secondary | ICD-10-CM

## 2016-04-10 DIAGNOSIS — C50912 Malignant neoplasm of unspecified site of left female breast: Secondary | ICD-10-CM

## 2016-04-10 DIAGNOSIS — Z510 Encounter for antineoplastic radiation therapy: Secondary | ICD-10-CM | POA: Diagnosis not present

## 2016-04-10 LAB — CBC WITH DIFFERENTIAL/PLATELET
BASO%: 0.5 % (ref 0.0–2.0)
BASOS ABS: 0 10*3/uL (ref 0.0–0.1)
EOS%: 0.2 % (ref 0.0–7.0)
Eosinophils Absolute: 0 10*3/uL (ref 0.0–0.5)
HEMATOCRIT: 45.7 % (ref 34.8–46.6)
HGB: 15 g/dL (ref 11.6–15.9)
LYMPH#: 0.5 10*3/uL — AB (ref 0.9–3.3)
LYMPH%: 7.1 % — ABNORMAL LOW (ref 14.0–49.7)
MCH: 34.6 pg — AB (ref 25.1–34.0)
MCHC: 32.9 g/dL (ref 31.5–36.0)
MCV: 105.3 fL — ABNORMAL HIGH (ref 79.5–101.0)
MONO#: 0.5 10*3/uL (ref 0.1–0.9)
MONO%: 7.6 % (ref 0.0–14.0)
NEUT#: 6 10*3/uL (ref 1.5–6.5)
NEUT%: 84.6 % — AB (ref 38.4–76.8)
Platelets: 194 10*3/uL (ref 145–400)
RBC: 4.34 10*6/uL (ref 3.70–5.45)
RDW: 15 % — ABNORMAL HIGH (ref 11.2–14.5)
WBC: 7.1 10*3/uL (ref 3.9–10.3)

## 2016-04-10 LAB — COMPREHENSIVE METABOLIC PANEL
ALT: 88 U/L — ABNORMAL HIGH (ref 0–55)
AST: 41 U/L — AB (ref 5–34)
Albumin: 3.2 g/dL — ABNORMAL LOW (ref 3.5–5.0)
Alkaline Phosphatase: 108 U/L (ref 40–150)
Anion Gap: 8 mEq/L (ref 3–11)
BUN: 21.4 mg/dL (ref 7.0–26.0)
CALCIUM: 9.3 mg/dL (ref 8.4–10.4)
CHLORIDE: 98 meq/L (ref 98–109)
CO2: 27 mEq/L (ref 22–29)
Creatinine: 0.8 mg/dL (ref 0.6–1.1)
EGFR: 83 mL/min/{1.73_m2} — ABNORMAL LOW (ref >90)
Glucose: 91 mg/dl (ref 70–140)
POTASSIUM: 4.7 meq/L (ref 3.5–5.1)
Sodium: 132 mEq/L — ABNORMAL LOW (ref 136–145)
Total Bilirubin: 0.58 mg/dL (ref 0.20–1.20)
Total Protein: 6.8 g/dL (ref 6.4–8.3)

## 2016-04-10 MED ORDER — OXYCODONE HCL ER 30 MG PO T12A: 30.0000 mg | EXTENDED_RELEASE_TABLET | Freq: Two times a day (BID) | ORAL | 0 refills | Status: DC

## 2016-04-10 NOTE — Progress Notes (Signed)
Gemzar education done. Handout given.

## 2016-04-10 NOTE — Progress Notes (Signed)
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 MRI head was done in [place] 03-21-16 when she presented with right facial numbness and right hemiparesis, reportedly showing involvement in left parietal area and concern for carcinomatosis. She began decadron and keppra by [facility] ED and was seen in consultation by Dr [name] on 03-23-16. Radiation is planned thru 04-10-16. Letrozole and palbociclib were stopped 03-19-16 with evidence of progression in lung, liver and bone; last xgeva was 03-19-16. At my last visit 03-19-16, we discussed changing systemic treatment to gemzar or eribulin, and PAC placement.   Patient seems to be tolerating radiation well, only complains of scalp itching. She denies any face or body weakness or HA, and does not report seizures. She has sore mouth and throat, history not easy and may have used diflucan prior to most recent combination mouthwash with nystatin. She is not complaining of GERD now; we had requested GI in [place], no appointment in EMR now.   Patient reports usual pain medication is still helping adequately, some pain right upper abdomen or lower ribs, can tolerate lying for radiation. Bowels are moving tho not daily. No bleeding. No SOB or cough. No LE swelling. No symptoms of infetion. Remainder of 10 point Review of Systems negative.    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 The malignancy was ER + 100%, PR 1%, HER 2 IHC/FISH negative, Ki67 52%, BRCA 1/2  negative, initial CA 2729 WNL. She had neoadjuvant AC x4 taxol x12 dose dense from 06-10-2007 thru 09-28-2007. 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 She had radiation to chest wall, supraclavicular region and left axilla 50.4 Gy with boost to mastectomy scar to 60.4 Gy, by Dr [name] from 12-15-2007 thru 01-31-2008. She was on tamoxifen from 02-20-2008 thru 09-25-2014. She developed pain in ribs 08-2014, with CA 2729 52 and PET CT and MRI with diffuse bone metastatic disease, no cord compression, small lung lesions and intrathoracic adenopathy. 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 She began Denosumab 120 mcg monthly starting 4-7-201, and letrozole + palbociclib beginning4-12-2014. She was seen in consultation by Dr [name] [phone]. Palbociclib dose decreased to 100 mg daily x 21 q 28 days due to neutropenia. CBC on 10-25-15: WBC 2.1, Hgb 13, plt 186, ANC 0.9 Most recent PET was 09-25-2014 in [facility] system; most recent imaging was CT CAP at [facility] 10-25-15 with no clear pulmonary involvement, stable 2 cm lesion at dome of liver and 11 mm left hepatic lobe lesion. She had right tomo mammogram at [facility] 09-17-15, with heterogeneously dense breast tissue but no other mammographic findings of concern.  CA 2729 has recently shown some increase, from 38 in 08-2015 to 49 in April 2017 and 65 in May 2017. She had evaluation for vaginal bleeding "from polyp", follows yearly with gynecologist in [place], up to date.  CT CAP /  PET 03-16-16 showed progression lung, liver and bone. Letrozole and ibrance DCd on 03-19-16.  MRI head in [place] 03-21-16 reportedly showed left parietal metastasis and concern for leptomeningeal spread. She elected whole brain RT, begun by Dr [name] ~ 03-23-16 thru  04-10-16.  Objective:  Vital signs in last 24 hours:  BP 119/81 (BP Location: Left Arm, Patient Position: Sitting)   Pulse 71   Temp 97.9 F (36.6 C) (Oral)   Resp 18   Ht 5' 5.5" (1.664 m)   Wt 155 lb (70.3 kg)   SpO2 98%   BMI 25.40 kg/m  Weight down 5 lbs.  Alert, oriented and appropriate, much more animated and talkative on steroids.. Ambulatory without assistance, able to get on and off exam table. .  No alopecia  HEENT:PERRL, sclerae not icteric. Oral mucosa moist, posterior tongue coated, without thrush on buccal mucosa,  posterior pharynx clear.  Neck supple. No JVD.  Lymphatics:no cervical,supraclavicular, axillary adenopathy Resp: clear to auscultation bilaterally and normal percussion bilaterally Cardio: regular rate and rhythm. No gallop. GI: soft, nontender, not distended, no mass or organomegaly. Normally active bowel sounds.  Musculoskeletal/ Extremities: no increased swelling LUE.  LE without pitting edema, cords, tenderness Neuro: speech fluent, moves all extremities equally. PSYCH more animated with steroids.  Skin without rash, ecchymosis, petechiae   Lab Results:  Results for orders placed or performed in visit on 04/10/16  CBC with Differential  Result Value Ref Range   WBC 7.1 3.9 - 10.3 10e3/uL   NEUT# 6.0 1.5 - 6.5 10e3/uL   HGB 15.0 11.6 - 15.9 g/dL   HCT 45.7 34.8 - 46.6 %   Platelets 194 145 - 400 10e3/uL   MCV 105.3 (H) 79.5 - 101.0 fL   MCH 34.6 (H) 25.1 - 34.0 pg   MCHC 32.9 31.5 - 36.0 g/dL   RBC 4.34 3.70 - 5.45 10e6/uL   RDW 15.0 (H) 11.2 - 14.5 %   lymph# 0.5 (L) 0.9 - 3.3 10e3/uL   MONO# 0.5 0.1 - 0.9 10e3/uL   Eosinophils Absolute 0.0 0.0 - 0.5 10e3/uL   Basophils Absolute 0.0 0.0 - 0.1 10e3/uL   NEUT% 84.6 (H) 38.4 - 76.8 %   LYMPH% 7.1 (L) 14.0 - 49.7 %   MONO% 7.6 0.0 - 14.0 %   EOS% 0.2 0.0 - 7.0 %   BASO% 0.5 0.0 - 2.0 %  Comprehensive metabolic panel  Result Value Ref Range   Sodium 132 (L) 136 - 145 mEq/L   Potassium  4.7 3.5 - 5.1 mEq/L   Chloride 98 98 - 109 mEq/L   CO2 27 22 - 29 mEq/L   Glucose 91 70 - 140 mg/dl   BUN 21.4 7.0 - 26.0 mg/dL   Creatinine 0.8 0.6 - 1.1 mg/dL   Total Bilirubin 0.58 0.20 - 1.20 mg/dL   Alkaline Phosphatase 108 40 - 150 U/L   AST 41 (H) 5 - 34 U/L   ALT 88 (H) 0 - 55 U/L   Total Protein 6.8 6.4 - 8.3 g/dL   Albumin 3.2 (L) 3.5 - 5.0 g/dL   Calcium 9.3 8.4 - 10.4 mg/dL   Anion Gap 8 3 - 11 mEq/L   EGFR 83 (L) >90 ml/min/1.73 m2     Studies/Results:  No results found.  Medications: I have reviewed the patient's current medications. Agrees to flu vaccine, which will be better done when down or off steroids Script written for usual oxycontin 30 mg q 12hrs  DISCUSSION Patient tells  me that she and husband want to try additional treatment after cranial radiation. She understands that the decadron will be tapered down per rad onc and that Dr [name] will follow also.  She agrees to [facility] placement.  I have told her that gemzar may have some effects also for metastatic disease to brain, so will proceed with gemzar. RN will do gemzar teaching with patient now and with husband by phone. WIll coordinate chemo and other appointments with husband's work schedule. Note sent to husband now by patient to let us know about scheduling; message to RN to follow up.  Verbal consent for chemo  Assessment/Plan:  1.Metastatic breast cancer: progressive in bone, lung, liver after initial good response on letrozole + Ibrance and xgeva, and new brain mets with possible leptomeningeal spread: whole brain RT to complete today. On steroids, rad onc planning gradual taper. I have asked for copy of cranial MRI from [facility], as I cannot locate this in EMR now and patient not sure which facility in [place] was used. Plan gemzar day 1 day 8 every 21 days for ~ 3 cycles, then possibly change to every other week. Continue xgeva. Needs PAC by IR for gemzar.  initial diagnosis at age 40 in 04-2007,  T2N1Mx ER + HER 2 -, treated with neoadjuvant AC taxol, mastectomy with node evaluation, radiation, tamoxifen x 7 years. Since recurrence 08-2014, she has had denosumab monthly, letrozole + palbociclib (dose reduced due to neutropenia) all begun 09-2014. Marker increasing and CT/PET confirm progression now lung, liver, bones.  2.GERD which has been symptomatic including nocturnal aspiration. Patient and husband had requested change to GI physician in [place] as care has moved here. 3.Lymphedema LUE: stable per patient. No recent lymphedema PT evaluation 4.needs PAC for chemo, requested with IR 5.up to date on gyn exams, patient does not recall name ofMD in [place] 6.BRCA testing negative in 2008. Will ask genetics counselors to review, consider MSI 7.Root canal and dental work for broken tooth 02-24-16, healed without problems. Xegva resumed 03-19-16. 8.no advance directives, will need to continue to address 9.pain related to metastatic breast cancer: adequately controlled with present oxycontin.  10. Drug induced leukopenia/ neutropenia related to ibrance. Counts good today off Ibrance and with steroids 11.post cholecystectomy. 12.needs flu vaccine: not given today with decadron doses at present 13.oral thrush: I believe she is on diflucan now, but I am not entirely sure from history.   All questions answered and she knows to call if needed prior to next visit. Chemo and granix orders entered using [employer] pathways. Message to managed care for preauth. Message to collaborative RNs re gemzar teaching with husband and coordination with his schedule.   Evlyn Clines, MD   04/10/2016, 8:19 PM

## 2016-04-12 ENCOUNTER — Encounter: Payer: Self-pay | Admitting: Oncology

## 2016-04-12 DIAGNOSIS — C50919 Malignant neoplasm of unspecified site of unspecified female breast: Secondary | ICD-10-CM

## 2016-04-12 DIAGNOSIS — C50912 Malignant neoplasm of unspecified site of left female breast: Secondary | ICD-10-CM | POA: Insufficient documentation

## 2016-04-12 HISTORY — DX: Malignant neoplasm of unspecified site of unspecified female breast: C50.919

## 2016-04-12 NOTE — Progress Notes (Signed)
START OFF PATHWAY REGIMEN - Breast  Off Pathway: Gemcitabine 1,000 mg/m2 D1, 8  q21 Days  [id number]:Gemcitabine 1,000 mg/m2 D1, 8  q21 Days:   A cycle is every 21 days:     Gemcitabine (Gemzar(R)) 1000 mg/m2 in 250 mL NS IV over 30 minutes days 1 and 8 Dose Mod: None  **Always confirm dose/schedule in your pharmacy ordering system**    Patient Characteristics: Metastatic Chemotherapy, HER2/neu Negative/Unknown/Equivocal, ER +, Third Line, Prior Anthracycline or Anthracycline Contraindicated and Prior Taxane AJCC Stage Grouping: IV Current Disease Status: Distant Metastases AJCC M Stage: X ER Status: Positive (+) AJCC N Stage: X AJCC T Stage: X HER2/neu: Negative (-) PR Status: Positive (+) Line of therapy: Third Line Would you be surprised if this patient died  in the next year? I would NOT be surprised if this patient died in the next year  Intent of Therapy: Non-Curative / Palliative Intent, Discussed with Patient

## 2016-04-13 ENCOUNTER — Telehealth: Payer: Self-pay | Admitting: *Deleted

## 2016-04-13 NOTE — Telephone Encounter (Signed)
I gave husband number for central scheduling to schedule port . Note routed to [place] to call him back

## 2016-04-13 NOTE — Progress Notes (Signed)
  Radiation Oncology         [phone] ________________________________  Name: Nickcole Blasberg MRN: [id number]  Date: 04/10/2016  DOB: 25-Nov-1965  End of Treatment Note  Diagnosis:   50 y.o. woman with progressive metastatic ER/PR positive breast cancer and symptomatic leptomeningeal carcinomatosis     Indication for treatment:  Palliation       Radiation treatment dates:  03/30/16 - 04/10/16  Site/dose:   The whole brain was treated to 30 Gy in 10 fractions of 3 Gy.  Beams/energy:   Right and Left radiation fields were treated using 6 MV X-rays with custom MLC collimation to shield the eyes and face.  The patient was immobilized with a thermoplastic mask and isocenter was verified with weekly port films.  Narrative: The patient tolerated radiation treatment relatively well. She reported some lethargy during the day with insomnia at night. Patient presented with thrush.  Plan: The patient has completed radiation treatment. She was prescribed Diflucan for thrush.The patient will return to radiation oncology clinic for routine followup in one month. I advised her to call or return sooner if she has any questions or concerns related to her recovery or treatment. ________________________________  Sheral Apley. [name], M.D.  This document serves as a record of services personally performed by [name], MD. It was created on his behalf by Maryla Morrow, a trained medical scribe. The creation of this record is based on the scribe's personal observations and the provider's statements to them. This document has been checked and approved by the attending provider.

## 2016-04-13 NOTE — Telephone Encounter (Signed)
Pt. Husband called in regards to his wife He wanted to know when Dr. Marko Plume  Was going to set a Date to have Jill Johnston's port placed also questions about her first TMT. Message was sent to vmail/message left.

## 2016-04-15 ENCOUNTER — Emergency Department (HOSPITAL_COMMUNITY): Payer: BLUE CROSS/BLUE SHIELD

## 2016-04-15 ENCOUNTER — Other Ambulatory Visit (HOSPITAL_COMMUNITY): Payer: Self-pay

## 2016-04-15 ENCOUNTER — Encounter (HOSPITAL_COMMUNITY): Payer: Self-pay | Admitting: Emergency Medicine

## 2016-04-15 ENCOUNTER — Inpatient Hospital Stay (HOSPITAL_COMMUNITY)
Admission: EM | Admit: 2016-04-15 | Discharge: 2016-04-17 | DRG: 543 | Disposition: A | Discharge status: Home / Self Care | Payer: BLUE CROSS/BLUE SHIELD | Attending: Internal Medicine | Admitting: Internal Medicine

## 2016-04-15 ENCOUNTER — Other Ambulatory Visit: Payer: Self-pay

## 2016-04-15 DIAGNOSIS — C50912 Malignant neoplasm of unspecified site of left female breast: Secondary | ICD-10-CM | POA: Diagnosis present

## 2016-04-15 DIAGNOSIS — I878 Other specified disorders of veins: Secondary | ICD-10-CM

## 2016-04-15 DIAGNOSIS — R200 Anesthesia of skin: Secondary | ICD-10-CM | POA: Diagnosis not present

## 2016-04-15 DIAGNOSIS — C7931 Secondary malignant neoplasm of brain: Secondary | ICD-10-CM | POA: Diagnosis present

## 2016-04-15 DIAGNOSIS — Z8249 Family history of ischemic heart disease and other diseases of the circulatory system: Secondary | ICD-10-CM

## 2016-04-15 DIAGNOSIS — B370 Candidal stomatitis: Secondary | ICD-10-CM

## 2016-04-15 DIAGNOSIS — Z66 Do not resuscitate: Secondary | ICD-10-CM | POA: Diagnosis present

## 2016-04-15 DIAGNOSIS — C7951 Secondary malignant neoplasm of bone: Secondary | ICD-10-CM | POA: Diagnosis not present

## 2016-04-15 DIAGNOSIS — T451X5A Adverse effect of antineoplastic and immunosuppressive drugs, initial encounter: Secondary | ICD-10-CM | POA: Diagnosis present

## 2016-04-15 DIAGNOSIS — D701 Agranulocytosis secondary to cancer chemotherapy: Secondary | ICD-10-CM | POA: Diagnosis present

## 2016-04-15 DIAGNOSIS — Z170 Estrogen receptor positive status [ER+]: Secondary | ICD-10-CM

## 2016-04-15 DIAGNOSIS — I10 Essential (primary) hypertension: Secondary | ICD-10-CM | POA: Diagnosis present

## 2016-04-15 DIAGNOSIS — C50919 Malignant neoplasm of unspecified site of unspecified female breast: Secondary | ICD-10-CM

## 2016-04-15 DIAGNOSIS — Z808 Family history of malignant neoplasm of other organs or systems: Secondary | ICD-10-CM

## 2016-04-15 DIAGNOSIS — C7949 Secondary malignant neoplasm of other parts of nervous system: Secondary | ICD-10-CM

## 2016-04-15 DIAGNOSIS — Z923 Personal history of irradiation: Secondary | ICD-10-CM

## 2016-04-15 DIAGNOSIS — F419 Anxiety disorder, unspecified: Secondary | ICD-10-CM | POA: Diagnosis present

## 2016-04-15 DIAGNOSIS — I890 Lymphedema, not elsewhere classified: Secondary | ICD-10-CM | POA: Diagnosis present

## 2016-04-15 DIAGNOSIS — C50412 Malignant neoplasm of upper-outer quadrant of left female breast: Secondary | ICD-10-CM | POA: Diagnosis present

## 2016-04-15 DIAGNOSIS — K219 Gastro-esophageal reflux disease without esophagitis: Secondary | ICD-10-CM | POA: Diagnosis present

## 2016-04-15 DIAGNOSIS — Z79891 Long term (current) use of opiate analgesic: Secondary | ICD-10-CM

## 2016-04-15 DIAGNOSIS — C787 Secondary malignant neoplasm of liver and intrahepatic bile duct: Secondary | ICD-10-CM | POA: Diagnosis present

## 2016-04-15 LAB — CBC WITH DIFFERENTIAL/PLATELET
Basophils Absolute: 0 10*3/uL (ref 0.0–0.1)
Basophils Relative: 0 %
EOS PCT: 1 %
Eosinophils Absolute: 0.1 10*3/uL (ref 0.0–0.7)
HEMATOCRIT: 44.1 % (ref 36.0–46.0)
Hemoglobin: 14.6 g/dL (ref 12.0–15.0)
LYMPHS ABS: 0.4 10*3/uL — AB (ref 0.7–4.0)
LYMPHS PCT: 8 %
MCH: 34.5 pg — AB (ref 26.0–34.0)
MCHC: 33.1 g/dL (ref 30.0–36.0)
MCV: 104.3 fL — AB (ref 78.0–100.0)
MONO ABS: 0.4 10*3/uL (ref 0.1–1.0)
Monocytes Relative: 7 %
NEUTROS ABS: 4.5 10*3/uL (ref 1.7–7.7)
Neutrophils Relative %: 84 %
PLATELETS: 139 10*3/uL — AB (ref 150–400)
RBC: 4.23 MIL/uL (ref 3.87–5.11)
RDW: 14.3 % (ref 11.5–15.5)
WBC: 5.4 10*3/uL (ref 4.0–10.5)

## 2016-04-15 LAB — COMPREHENSIVE METABOLIC PANEL
ALT: 96 U/L — AB (ref 14–54)
AST: 48 U/L — ABNORMAL HIGH (ref 15–41)
Albumin: 3.4 g/dL — ABNORMAL LOW (ref 3.5–5.0)
Alkaline Phosphatase: 89 U/L (ref 38–126)
Anion gap: 8 (ref 5–15)
BILIRUBIN TOTAL: 0.7 mg/dL (ref 0.3–1.2)
BUN: 19 mg/dL (ref 6–20)
CHLORIDE: 99 mmol/L — AB (ref 101–111)
CO2: 29 mmol/L (ref 22–32)
CREATININE: 0.78 mg/dL (ref 0.44–1.00)
Calcium: 9 mg/dL (ref 8.9–10.3)
Glucose, Bld: 120 mg/dL — ABNORMAL HIGH (ref 65–99)
Potassium: 4.4 mmol/L (ref 3.5–5.1)
Sodium: 136 mmol/L (ref 135–145)
TOTAL PROTEIN: 6.4 g/dL — AB (ref 6.5–8.1)

## 2016-04-15 LAB — URINALYSIS, ROUTINE W REFLEX MICROSCOPIC
Bilirubin Urine: NEGATIVE
GLUCOSE, UA: NEGATIVE mg/dL
Hgb urine dipstick: NEGATIVE
Ketones, ur: NEGATIVE mg/dL
LEUKOCYTES UA: NEGATIVE
Nitrite: NEGATIVE
PROTEIN: 30 mg/dL — AB
Specific Gravity, Urine: 1.015 (ref 1.005–1.030)
pH: 6 (ref 5.0–8.0)

## 2016-04-15 LAB — I-STAT TROPONIN, ED: TROPONIN I, POC: 0.01 ng/mL (ref 0.00–0.08)

## 2016-04-15 LAB — LIPASE, BLOOD: LIPASE: 19 U/L (ref 11–51)

## 2016-04-15 LAB — URINE MICROSCOPIC-ADD ON
Bacteria, UA: NONE SEEN
SQUAMOUS EPITHELIAL / LPF: NONE SEEN
WBC UA: NONE SEEN WBC/hpf (ref 0–5)

## 2016-04-15 MED ORDER — DEXAMETHASONE SODIUM PHOSPHATE 10 MG/ML IJ SOLN
8.0000 mg | Freq: Four times a day (QID) | INTRAMUSCULAR | Status: DC
  Administered 2016-04-16: 8 mg via INTRAVENOUS
  Filled 2016-04-15 (×3): qty 1

## 2016-04-15 NOTE — ED Triage Notes (Signed)
Pt from home with complaints of numbness all over her body incontinence that began 3 days ago. Pt reports feeling the sensation of touch, but not pain and not tempature. Pt had a week of radiation that ended Friday 13th. Pt also has a headache that comes and goes. Pt has not taken her lisinopril for about 1 week.   Pt has white spots on the back of her throat. Pt denies pain in that area, but states she cant feel pain. Pt has upper middle abdominal pain that improves when she swallows food. Pt denies emesis and denies diarrhea.

## 2016-04-15 NOTE — ED Notes (Signed)
Nurse will draw labs with IV start. 

## 2016-04-15 NOTE — ED Notes (Signed)
Patient states she was unable to tolerate the MRI stating "it made my head throb" ED provider notified

## 2016-04-15 NOTE — ED Provider Notes (Signed)
[facility] DEPT Provider Note   CSN: [id number] Arrival date & time: 04/15/16  1737     History   Chief Complaint Chief Complaint  Patient presents with  . cancer patient   . Numbness    HPI Jill Johnston is a 50 y.o. female.  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 Pt reports since receiving her last brain radiation tx on 10/13, she has had diffuse decreased sensation throughout. She reports being unable to feel pain and feeling numb all over. Pt endorses having chronic back and abdominal pain related to her metastatic CA but notes she now feels numb all over. She states she is able to feel the sensation of touch but is unable to feel pain or temperature. Denies any recent falls. Pt denies fever, chest pain, SOB, numbness, tingling, saddle anesthesia, loss of bowel or bladder, urinary retention, weakness. She also reports she has had white spots in her mouth for the past few weeks since started steroids 3 weeks ago. She notes she was initially tx with oral medication and then finished PO fluconazole last week without improvement of sxs. Denies dysphagia, sensation of throat closing, sore throat, drooling, trismus, facial or neck swelling.      Past Medical History:  Diagnosis Date  . Anxiety   . 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 Breast cancer metastasized to multiple sites ([place]) 04/12/2016  . GERD (gastroesophageal reflux disease)     Patient Active Problem List   Diagnosis Date Noted  . Numbness 04/16/2016  . Breast cancer metastasized to multiple sites ([place]) 04/12/2016  . 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 Breast cancer metastasized to lung ([place]) 03/20/2016  . 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) 03/20/2016  . Gastroesophageal reflux  disease with esophagitis 03/20/2016  . Drug-induced leukopenia ([place]) 01/26/2016  . High risk medication use 01/26/2016  . Lymphedema of arm 01/12/2016  . Status post cholecystectomy 01/12/2016  . Cancer associated pain 01/12/2016  . Breast cancer metastasized to bone ([place]) 01/12/2016  . GERD (gastroesophageal reflux disease) 07/04/2015  . Bone metastases ([place]) 01/07/2015  . Malignant neoplasm of left breast [facility])     Past Surgical History:  Procedure Laterality Date  . CHOLECYSTECTOMY    . ESOPHAGOGASTRODUODENOSCOPY (EGD) WITH PROPOFOL N/A 07/12/2015   Procedure: ESOPHAGOGASTRODUODENOSCOPY (EGD) WITH PROPOFOL;  Surgeon: Rogene Houston, MD;  Location: [facility];  Service: Endoscopy;  Laterality: N/A;  1:00  . MASTECTOMY  April 2009   Left breast with lymph node resection    OB History    No data available       Home Medications    Prior to Admission medications   Medication Sig Start Date End Date Taking? Authorizing Provider  dexamethasone (DECADRON) 4 MG tablet Take one tablet po q 6 hours. Taper instructions to follow Patient taking differently: 4 mg 2 (two) times daily. Take one tablet po q 6 hours. Taper instructions to follow 03/23/16  Yes Hayden Pedro, PA-C  levETIRAcetam (KEPPRA) 1000 MG tablet Take 1 tablet (1,000 mg total) by mouth 2 (two) times daily. 03/23/16  Yes Hayden Pedro, PA-C  lisinopril (PRINIVIL,ZESTRIL) 10 MG tablet Take 1 tablet (10 mg total) by mouth daily. 01/23/16  Yes Lennis Marion Downer, MD  LORazepam (  ATIVAN) 0.5 MG tablet Take 1-2 tablets (0.5-1 mg total) by mouth every 6 (six) hours as needed for anxiety (or nausea). 01/23/16  Yes Lennis P Livesay, MD  ondansetron (ZOFRAN) 8 MG tablet Take 1 tablet (8 mg total) by mouth every 8 (eight) hours as needed for nausea or vomiting. 04/01/16  Yes Hayden Pedro, PA-C  oxyCODONE (OXYCONTIN) 30 MG 12 hr tablet Take 30 mg by mouth 2 (two) times daily. 04/10/16  Yes Lennis Marion Downer, MD    Oxycodone HCl 10 MG TABS Take 1 tablet by mouth every 3 (three) hours as needed for pain. 11/29/15  Yes Historical Provider, MD  pantoprazole (PROTONIX) 40 MG tablet Take 1 tablet (40 mg total) by mouth 2 (two) times daily before a meal. 01/06/16  Yes Butch Penny, NP  polyethylene glycol (MIRALAX / GLYCOLAX) packet Take 17 grams by mouth every day. 10/11/14  Yes Historical Provider, MD  Diphenhyd-Hydrocort-Nystatin (FIRST-DUKES MOUTHWASH) SUSP Use as directed 15 mLs in the mouth or throat 4 (four) times daily as needed. Patient not taking: Reported on 04/15/2016 04/01/16   Hayden Pedro, PA-C  fluconazole (DIFLUCAN) 100 MG tablet Take 1 tablet (100 mg total) by mouth daily. Patient not taking: Reported on 04/15/2016 04/09/16   Gordy Levan, MD    Family History Family History  Problem Relation Age of Onset  . Heart attack Mother     Died age 100  . Heart disease Father     Died age 68  . Brain cancer Father     Social History Social History  Substance Use Topics  . Smoking status: Never Smoker  . Smokeless tobacco: Never Used  . Alcohol use No     Allergies   Review of patient's allergies indicates no known allergies.   Review of Systems Review of Systems  HENT: Positive for mouth sores.   Gastrointestinal: Positive for abdominal pain.  Neurological: Positive for numbness and headaches.  All other systems reviewed and are negative.    Physical Exam Updated Vital Signs BP (!) 148/101 (BP Location: Left Arm)   Pulse 100   Temp 98.2 F (36.8 C) (Oral)   Resp 18   Ht 5\' 5"  (1.651 m)   Wt 69 kg   SpO2 95%   BMI 25.33 kg/m   Physical Exam  Constitutional: She is oriented to person, place, and time. She appears well-developed and well-nourished. No distress.  HENT:  Head: Normocephalic and atraumatic.  Nose: Nose normal.  Mouth/Throat: Uvula is midline, oropharynx is clear and moist and mucous membranes are normal. Oral lesions (multiple small white  lesions noted to buccal mucosa and oropharynx) present. No oropharyngeal exudate, posterior oropharyngeal edema, posterior oropharyngeal erythema or tonsillar abscesses. No tonsillar exudate.  Eyes: Conjunctivae and EOM are normal. Right eye exhibits no discharge. Left eye exhibits no discharge. No scleral icterus.  Neck: Normal range of motion. Neck supple.  Cardiovascular: Normal rate, regular rhythm, normal heart sounds and intact distal pulses.   HR 86  Pulmonary/Chest: Effort normal and breath sounds normal. No respiratory distress. She has no wheezes. She has no rales. She exhibits no tenderness.  Abdominal: Soft. Bowel sounds are normal. She exhibits no distension and no mass. There is tenderness (RUQ, epigastric region). There is no rebound and no guarding. No hernia.  Musculoskeletal: Normal range of motion. She exhibits no edema, tenderness or deformity.  No midline C, T, or L tenderness. Full range of motion of neck and back. Full range of  motion of bilateral upper and lower extremities, with 5/5 strength. Sensation grossly intact, pt able to differentiate soft and dull touch. 2+ radial and PT pulses. Cap refill <2 seconds. Patient able to stand and ambulate without assistance.    Neurological: She is alert and oriented to person, place, and time. She has normal strength. No cranial nerve deficit or sensory deficit. Coordination and gait normal.  No saddle anesthesia.  Skin: Skin is warm and dry. She is not diaphoretic.  Nursing note and vitals reviewed.    ED Treatments / Results  Labs (all labs ordered are listed, but only abnormal results are displayed) Labs Reviewed  CBC WITH DIFFERENTIAL/PLATELET - Abnormal; Notable for the following:       Result Value   MCV 104.3 (*)    MCH 34.5 (*)    Platelets 139 (*)    Lymphs Abs 0.4 (*)    All other components within normal limits  COMPREHENSIVE METABOLIC PANEL - Abnormal; Notable for the following:    Chloride 99 (*)    Glucose,  Bld 120 (*)    Total Protein 6.4 (*)    Albumin 3.4 (*)    AST 48 (*)    ALT 96 (*)    All other components within normal limits  URINALYSIS, ROUTINE W REFLEX MICROSCOPIC (NOT AT [facility]) - Abnormal; Notable for the following:    Protein, ur 30 (*)    All other components within normal limits  LIPASE, BLOOD  URINE MICROSCOPIC-ADD ON  I-STAT TROPOININ, ED    EKG  EKG Interpretation None       Radiology Ct Head Wo Contrast  Result Date: 04/15/2016 CLINICAL DATA:  Acute onset of generalized numbness and incontinence. Intermittent headache. Initial encounter. EXAM: CT HEAD WITHOUT CONTRAST TECHNIQUE: Contiguous axial images were obtained from the base of the skull through the vertex without intravenous contrast. COMPARISON:  MRI of the brain performed earlier today at 9:16 p.m. FINDINGS: Brain: No evidence of acute infarction, hemorrhage, hydrocephalus, extra-axial collection or mass lesion/mass effect. The posterior fossa, including the cerebellum, brainstem and fourth ventricle, is within normal limits. The third and lateral ventricles, and basal ganglia are unremarkable in appearance. The cerebral hemispheres are symmetric in appearance, with normal gray-white differentiation. No mass effect or midline shift is seen. Vascular: No hyperdense vessel or unexpected calcification. Skull: There is no evidence of fracture; visualized osseous structures are unremarkable in appearance. Sinuses/Orbits: The visualized portions of the orbits are within normal limits. The paranasal sinuses and mastoid air cells are well-aerated. Other: No significant soft tissue abnormalities are seen. IMPRESSION: Unremarkable noncontrast CT of the head. Electronically Signed   By: Garald Balding M.D.   On: 04/15/2016 23:14   Mr Brain Wo Contrast  Result Date: 04/15/2016 CLINICAL DATA:  Numbness over the entire body. Incontinence beginning 3 days ago. Metastatic breast cancer. EXAM: MRI HEAD WITHOUT CONTRAST TECHNIQUE:  Multiplanar, multiecho pulse sequences of the brain and surrounding structures were obtained without intravenous contrast. COMPARISON:  None. FINDINGS: Diffusion imaging does not show any acute or subacute infarction. The brainstem and cerebellum are normal. Cerebral hemispheres are normal except for some low level FLAIR and T2 signal along the surface of the gyri in the left frontoparietal junction region. No restricted diffusion is noted in that area. There is probably some adjacent dural thickening. The patient is seen Tomi Bamberger osseous metastatic disease throughout the calvarium, the skullbase in the upper cervical spine. Therefore, this probably represents metastatic disease to the dura and leptomeninges  in this region. The patient would not allow additional scanning and contrast-enhanced imaging is not performed. No hydrocephalus. No primarily intraparenchymal metastatic lesion is suspected. Minimal small-vessel changes affect the white matter. No pituitary mass. Major vessels at the base of the brain show flow. No inflammatory sinus disease. IMPRESSION: Osseous metastatic disease affecting the calvarium, skullbase and upper cervical spine. No evidence of parenchymal metastatic lesion. Dural thickening in the left frontoparietal region with some abnormal signal along the surface of the brain in the left frontoparietal region. 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 Unfortunately, the patient would not allow completion of the exam or other scans. Electronically Signed   By: Nelson Chimes M.D.   On: 04/15/2016 22:10    Procedures Procedures (including critical care time)  Medications Ordered in ED Medications  dexamethasone (DECADRON) injection 8 mg (8 mg Intravenous Given 04/16/16 0006)     Initial Impression / Assessment and Plan / ED Course  I have reviewed the triage vital signs and the nursing notes.  Pertinent labs & imaging results that were available during my care  of the patient were reviewed by me and considered in my medical decision making (see chart for details).  Clinical Course   Pt presents with diffuse generalized numbness for the past week which she notes started after her last brain radiation tx last week. Hx of metastatic breast cancer (mets to bone, liver, lung). Also reports having a headache and notes she has chronic unchanged abdominal pain related to her liver mets. VSS. Exam revealed TTP of RUQ and epigastric region, no peritoneal signs. No neuro deficits. Sensation grossly intact bilaterally. Consulted radiation oncology who advised to start pt on decadron 8mg  Q6 and order MRI brain, cervical, lumbar and thoracic spine. Consulted medical oncology who advised to contact pt's medical oncologist (Dr. Evlyn Clines) in the morning for consult. CT head negative. MR brain showed osseous metastatic disease affecting the calvarium, skullbase and upper cervical spine; evidence of dural and leptomeningeal metastatic disease. Remaining MRIs were not completed due to pt noncompliance during imaging, reports having severe headache from the sound of the MRI machine. Consulted hospitalist for admission. Dr. Hal Hope agrees to admission. Orders placed for tele bed. Discussed results and plan for admission with pt.   Final Clinical Impressions(s) / ED Diagnoses   Final diagnoses:  Numbness    New Prescriptions Current Discharge Medication List       Nona Dell, PA-C 04/16/16 0151    Drenda Freeze, MD 04/16/16 [phone]

## 2016-04-15 NOTE — ED Notes (Signed)
Pt transported to MRI 

## 2016-04-15 NOTE — ED Notes (Signed)
Patient to MRI.

## 2016-04-16 ENCOUNTER — Telehealth: Payer: Self-pay

## 2016-04-16 ENCOUNTER — Ambulatory Visit
Admit: 2016-04-16 | Discharge: 2016-04-16 | Disposition: A | Discharge status: Home / Self Care | Payer: BLUE CROSS/BLUE SHIELD | Attending: Radiation Oncology | Admitting: Radiation Oncology

## 2016-04-16 ENCOUNTER — Inpatient Hospital Stay (HOSPITAL_COMMUNITY): Payer: BLUE CROSS/BLUE SHIELD

## 2016-04-16 ENCOUNTER — Encounter (HOSPITAL_COMMUNITY): Payer: Self-pay | Admitting: Internal Medicine

## 2016-04-16 DIAGNOSIS — C7951 Secondary malignant neoplasm of bone: Principal | ICD-10-CM

## 2016-04-16 DIAGNOSIS — I10 Essential (primary) hypertension: Secondary | ICD-10-CM | POA: Diagnosis present

## 2016-04-16 DIAGNOSIS — C787 Secondary malignant neoplasm of liver and intrahepatic bile duct: Secondary | ICD-10-CM

## 2016-04-16 DIAGNOSIS — T451X5A Adverse effect of antineoplastic and immunosuppressive drugs, initial encounter: Secondary | ICD-10-CM | POA: Diagnosis present

## 2016-04-16 DIAGNOSIS — C7931 Secondary malignant neoplasm of brain: Secondary | ICD-10-CM | POA: Diagnosis present

## 2016-04-16 DIAGNOSIS — I890 Lymphedema, not elsewhere classified: Secondary | ICD-10-CM | POA: Diagnosis present

## 2016-04-16 DIAGNOSIS — C7802 Secondary malignant neoplasm of left lung: Secondary | ICD-10-CM

## 2016-04-16 DIAGNOSIS — Z79891 Long term (current) use of opiate analgesic: Secondary | ICD-10-CM | POA: Diagnosis not present

## 2016-04-16 DIAGNOSIS — C50212 Malignant neoplasm of upper-inner quadrant of left female breast: Secondary | ICD-10-CM | POA: Diagnosis not present

## 2016-04-16 DIAGNOSIS — C7949 Secondary malignant neoplasm of other parts of nervous system: Secondary | ICD-10-CM

## 2016-04-16 DIAGNOSIS — C50912 Malignant neoplasm of unspecified site of left female breast: Secondary | ICD-10-CM

## 2016-04-16 DIAGNOSIS — C50919 Malignant neoplasm of unspecified site of unspecified female breast: Secondary | ICD-10-CM

## 2016-04-16 DIAGNOSIS — R200 Anesthesia of skin: Secondary | ICD-10-CM | POA: Diagnosis present

## 2016-04-16 DIAGNOSIS — Z808 Family history of malignant neoplasm of other organs or systems: Secondary | ICD-10-CM | POA: Diagnosis not present

## 2016-04-16 DIAGNOSIS — G893 Neoplasm related pain (acute) (chronic): Secondary | ICD-10-CM

## 2016-04-16 DIAGNOSIS — Z923 Personal history of irradiation: Secondary | ICD-10-CM | POA: Diagnosis not present

## 2016-04-16 DIAGNOSIS — C7800 Secondary malignant neoplasm of unspecified lung: Secondary | ICD-10-CM | POA: Diagnosis not present

## 2016-04-16 DIAGNOSIS — C50412 Malignant neoplasm of upper-outer quadrant of left female breast: Secondary | ICD-10-CM | POA: Diagnosis present

## 2016-04-16 DIAGNOSIS — B370 Candidal stomatitis: Secondary | ICD-10-CM

## 2016-04-16 DIAGNOSIS — K219 Gastro-esophageal reflux disease without esophagitis: Secondary | ICD-10-CM | POA: Diagnosis present

## 2016-04-16 DIAGNOSIS — Z8249 Family history of ischemic heart disease and other diseases of the circulatory system: Secondary | ICD-10-CM | POA: Diagnosis not present

## 2016-04-16 DIAGNOSIS — D701 Agranulocytosis secondary to cancer chemotherapy: Secondary | ICD-10-CM | POA: Diagnosis present

## 2016-04-16 DIAGNOSIS — Z170 Estrogen receptor positive status [ER+]: Secondary | ICD-10-CM | POA: Diagnosis not present

## 2016-04-16 DIAGNOSIS — F419 Anxiety disorder, unspecified: Secondary | ICD-10-CM | POA: Diagnosis present

## 2016-04-16 DIAGNOSIS — Z66 Do not resuscitate: Secondary | ICD-10-CM | POA: Diagnosis present

## 2016-04-16 MED ORDER — FLUCONAZOLE 100 MG PO TABS
200.0000 mg | ORAL_TABLET | Freq: Once | ORAL | Status: AC
  Administered 2016-04-16: 200 mg via ORAL
  Filled 2016-04-16: qty 2

## 2016-04-16 MED ORDER — ACETAMINOPHEN 325 MG PO TABS: 650.0000 mg | ORAL_TABLET | Freq: Four times a day (QID) | ORAL | Status: DC | PRN

## 2016-04-16 MED ORDER — GADOBENATE DIMEGLUMINE 529 MG/ML IV SOLN
15.0000 mL | Freq: Once | INTRAVENOUS | Status: AC | PRN
  Administered 2016-04-16: 14 mL via INTRAVENOUS

## 2016-04-16 MED ORDER — OXYCODONE HCL ER 15 MG PO T12A
30.0000 mg | EXTENDED_RELEASE_TABLET | Freq: Two times a day (BID) | ORAL | Status: DC
  Administered 2016-04-16 – 2016-04-17 (×3): 30 mg via ORAL
  Filled 2016-04-16 (×5): qty 2

## 2016-04-16 MED ORDER — ONDANSETRON HCL 4 MG PO TABS: 8.0000 mg | ORAL_TABLET | Freq: Three times a day (TID) | ORAL | Status: DC | PRN

## 2016-04-16 MED ORDER — LORAZEPAM 0.5 MG PO TABS: 0.5000 mg | ORAL_TABLET | Freq: Four times a day (QID) | ORAL | Status: DC | PRN

## 2016-04-16 MED ORDER — POLYETHYLENE GLYCOL 3350 17 G PO PACK
17.0000 g | PACK | Freq: Every day | ORAL | Status: DC
  Administered 2016-04-16: 17 g via ORAL
  Filled 2016-04-16: qty 1

## 2016-04-16 MED ORDER — PAMIDRONATE DISODIUM 90 MG/10ML IV SOLN
90.0000 mg | Freq: Once | INTRAVENOUS | Status: AC
  Administered 2016-04-16: 90 mg via INTRAVENOUS
  Filled 2016-04-16: qty 10

## 2016-04-16 MED ORDER — DEXAMETHASONE SODIUM PHOSPHATE 4 MG/ML IJ SOLN
4.0000 mg | Freq: Four times a day (QID) | INTRAMUSCULAR | Status: DC
  Administered 2016-04-16 (×2): 4 mg via INTRAVENOUS
  Filled 2016-04-16 (×5): qty 1

## 2016-04-16 MED ORDER — PANTOPRAZOLE SODIUM 40 MG PO TBEC
40.0000 mg | DELAYED_RELEASE_TABLET | Freq: Two times a day (BID) | ORAL | Status: DC
  Administered 2016-04-16 – 2016-04-17 (×2): 40 mg via ORAL
  Filled 2016-04-16 (×4): qty 1

## 2016-04-16 MED ORDER — SODIUM CHLORIDE 0.9 % IV SOLN
INTRAVENOUS | Status: DC
  Administered 2016-04-16: 03:00:00 via INTRAVENOUS

## 2016-04-16 MED ORDER — ACETAMINOPHEN 650 MG RE SUPP: 650.0000 mg | Freq: Four times a day (QID) | RECTAL | Status: DC | PRN

## 2016-04-16 MED ORDER — LISINOPRIL 10 MG PO TABS
10.0000 mg | ORAL_TABLET | Freq: Every day | ORAL | Status: DC
  Administered 2016-04-16 – 2016-04-17 (×2): 10 mg via ORAL
  Filled 2016-04-16 (×2): qty 1

## 2016-04-16 MED ORDER — LEVETIRACETAM 500 MG PO TABS
500.0000 mg | ORAL_TABLET | Freq: Two times a day (BID) | ORAL | Status: DC
  Administered 2016-04-16 – 2016-04-17 (×3): 500 mg via ORAL
  Filled 2016-04-16 (×4): qty 1

## 2016-04-16 MED ORDER — FLUCONAZOLE 100 MG PO TABS
100.0000 mg | ORAL_TABLET | Freq: Every day | ORAL | Status: DC
  Filled 2016-04-16: qty 1

## 2016-04-16 MED ORDER — OXYCODONE HCL 5 MG PO TABS
10.0000 mg | ORAL_TABLET | ORAL | Status: DC | PRN
  Administered 2016-04-16: 10 mg via ORAL
  Filled 2016-04-16 (×2): qty 2

## 2016-04-16 MED ORDER — ONDANSETRON HCL 4 MG PO TABS: 4.0000 mg | ORAL_TABLET | Freq: Four times a day (QID) | ORAL | Status: DC | PRN

## 2016-04-16 MED ORDER — ONDANSETRON HCL 4 MG/2ML IJ SOLN: 4.0000 mg | Freq: Four times a day (QID) | INTRAMUSCULAR | Status: DC | PRN

## 2016-04-16 NOTE — Progress Notes (Signed)
Radiation Oncology         [phone]) [phone] ________________________________  Follow-up Inpatient Consultation   Name: Jill Johnston MRN: [id number]  Date: 04/16/16  DOB: Mar 09, 1966  PO:[id number], MD  No ref. provider found   REFERRING PHYSICIAN: No ref. provider found  DIAGNOSIS: The encounter diagnosis was Numbness.    ICD-9-CM ICD-10-CM   1. Numbness 782.0 R20.0 MR Lumbar Spine W Wo Contrast     MR THORACIC SPINE W WO CONTRAST     MR BRAIN WO CONTRAST     MR BRAIN WO CONTRAST    HISTORY OF PRESENT ILLNESS: Jill Johnston is a 50 y.o. female seen at the request of Dr. Erlinda Hong for evaluation of a sensation of whole body numbness. She recently completed whole brain radiotherapy about 1 week ago. She was started on a steroid taper and has been taking this as directed.  She was evaluated for this concern of whole body numbness in the ED and an MRI of the brian revealed dural thickening of the left frontoparietal region with abnormal signal. Dr. Johny Shears personal review of this actually shows improvement since her outside MRI from [facility] on 03/21/16. She was unable to complete MRI due to intolerance. We are asked to see the patient for recommendations of steroid administration.  PREVIOUS RADIATION THERAPY: Yes   03/30/16 - 04/10/16: The whole brain was treated to 30 Gy in 10 fractions of 3 Gy.  01/10/15-01/23/15: Metastatic deposits in the sacrum and femurs were conformally treated to 30 Gy in 10 fractions of 3 Gy  12/15/07-01/31/08: 50.4 Gy to the chest wall with supraclavicular and left axillary field with a boost to the mastectomy scar to total 60.4 Gy  PAST MEDICAL HISTORY:  Past Medical History:  Diagnosis Date  . Anxiety   . 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 Breast cancer metastasized to multiple sites ([place]) 04/12/2016  . GERD (gastroesophageal reflux disease)       PAST SURGICAL  HISTORY: Past Surgical History:  Procedure Laterality Date  . CHOLECYSTECTOMY    . ESOPHAGOGASTRODUODENOSCOPY (EGD) WITH PROPOFOL N/A 07/12/2015   Procedure: ESOPHAGOGASTRODUODENOSCOPY (EGD) WITH PROPOFOL;  Surgeon: Rogene Houston, MD;  Location: [facility];  Service: Endoscopy;  Laterality: N/A;  1:00  . MASTECTOMY  April 2009   Left breast with lymph node resection    FAMILY HISTORY:  Family History  Problem Relation Age of Onset  . Heart attack Mother     Died age 80  . Heart disease Father     Died age 65  . Brain cancer Father     SOCIAL HISTORY:  Social History   Social History  . Marital status: Married    Spouse name: N/A  . Number of children: N/A  . Years of education: N/A   Occupational History  . Not on file.   Social History Main Topics  . Smoking status: Never Smoker  . Smokeless tobacco: Never Used  . Alcohol use No  . Drug use: No  . Sexual activity: Yes   Other Topics Concern  . Not on file   Social History Narrative  . No narrative on file    ALLERGIES: Review of patient's allergies indicates no known allergies.  MEDICATIONS:  Current Facility-Administered Medications  Medication Dose Route Frequency Provider Last Rate Last Dose  . acetaminophen (TYLENOL) tablet 650 mg  650 mg Oral Q6H PRN Rise Patience, MD  Or  . acetaminophen (TYLENOL) suppository 650 mg  650 mg Rectal Q6H PRN Rise Patience, MD      . dexamethasone (DECADRON) injection 4 mg  4 mg Intravenous Q6H Rise Patience, MD   4 mg at 04/16/16 1229  . levETIRAcetam (KEPPRA) tablet 500 mg  500 mg Oral BID Rise Patience, MD   500 mg at 04/16/16 1021  . lisinopril (PRINIVIL,ZESTRIL) tablet 10 mg  10 mg Oral Daily Rise Patience, MD   10 mg at 04/16/16 1021  . LORazepam (ATIVAN) tablet 0.5-1 mg  0.5-1 mg Oral Q6H PRN Rise Patience, MD      . ondansetron [facility]) tablet 4 mg  4 mg Oral Q6H PRN Rise Patience, MD       Or  . ondansetron  [facility]) injection 4 mg  4 mg Intravenous Q6H PRN Rise Patience, MD      . ondansetron [facility]) tablet 8 mg  8 mg Oral Q8H PRN Rise Patience, MD      . oxyCODONE (Oxy IR/ROXICODONE) immediate release tablet 10 mg  10 mg Oral Q3H PRN Rise Patience, MD   10 mg at 04/16/16 0840  . oxyCODONE (OXYCONTIN) 12 hr tablet 30 mg  30 mg Oral BID Rise Patience, MD   30 mg at 04/16/16 1233  . pantoprazole (PROTONIX) EC tablet 40 mg  40 mg Oral BID AC Rise Patience, MD   40 mg at 04/16/16 0839  . polyethylene glycol (MIRALAX / GLYCOLAX) packet 17 g  17 g Oral Daily Rise Patience, MD   17 g at 04/16/16 1021    REVIEW OF SYSTEMS:  On review of systems, the patient reports that she is doing a bit better since steroid administration but she doesn't want to continue this. She denies any chest pain, shortness of breath, cough, fevers, chills, night sweats, unintended weight changes. She denies any bowel or bladder disturbances, and denies abdominal pain, nausea or vomiting. She denies any new musculoskeletal or joint aches or pains. A complete review of systems is obtained and is otherwise negative.    PHYSICAL EXAM:  height is 5\' 5"  (1.651 m) and weight is 152 lb 3.2 oz (69 kg). Her oral temperature is 98.1 F (36.7 C). Her blood pressure is 125/92 (abnormal) and her pulse is 97. Her respiration is 16 and oxygen saturation is 97%.   Pain Scale 0/10 In general this is a well appearing she in no acute distress. She is alert and oriented x4 and appropriate throughout the examination. Cardiopulmonary assessment is negative for acute distress and she exhibits normal effort. She appears to be neurologically intact.   KPS = 80  100 - Normal; no complaints; no evidence of disease. 90   - Able to carry on normal activity; minor signs or symptoms of disease. 80   - Normal activity with effort; some signs or symptoms of disease. 33   - Cares for self; unable to carry on normal activity or  to do active work. 60   - Requires occasional assistance, but is able to care for most of his personal needs. 50   - Requires considerable assistance and frequent medical care. 70   - Disabled; requires special care and assistance. 69   - Severely disabled; hospital admission is indicated although death not imminent. 4   - Very sick; hospital admission necessary; active supportive treatment necessary. 10   - Moribund; fatal processes progressing rapidly. 0     -  Dead  Karnofsky DA, Abelmann Tarnov, Craver LS and Burchenal [facility] [phone]) The use of the nitrogen mustards in the palliative treatment of carcinoma: with particular reference to bronchogenic carcinoma Cancer 1 634-56  LABORATORY DATA:  Lab Results  Component Value Date   WBC 5.4 04/15/2016   HGB 14.6 04/15/2016   HCT 44.1 04/15/2016   MCV 104.3 (H) 04/15/2016   PLT 139 (L) 04/15/2016   Lab Results  Component Value Date   NA 136 04/15/2016   K 4.4 04/15/2016   CL 99 (L) 04/15/2016   CO2 29 04/15/2016   Lab Results  Component Value Date   ALT 96 (H) 04/15/2016   AST 48 (H) 04/15/2016   ALKPHOS 89 04/15/2016   BILITOT 0.7 04/15/2016     RADIOGRAPHY: Ct Head Wo Contrast  Result Date: 04/15/2016 CLINICAL DATA:  Acute onset of generalized numbness and incontinence. Intermittent headache. Initial encounter. EXAM: CT HEAD WITHOUT CONTRAST TECHNIQUE: Contiguous axial images were obtained from the base of the skull through the vertex without intravenous contrast. COMPARISON:  MRI of the brain performed earlier today at 9:16 p.m. FINDINGS: Brain: No evidence of acute infarction, hemorrhage, hydrocephalus, extra-axial collection or mass lesion/mass effect. The posterior fossa, including the cerebellum, brainstem and fourth ventricle, is within normal limits. The third and lateral ventricles, and basal ganglia are unremarkable in appearance. The cerebral hemispheres are symmetric in appearance, with normal gray-white differentiation. No  mass effect or midline shift is seen. Vascular: No hyperdense vessel or unexpected calcification. Skull: There is no evidence of fracture; visualized osseous structures are unremarkable in appearance. Sinuses/Orbits: The visualized portions of the orbits are within normal limits. The paranasal sinuses and mastoid air cells are well-aerated. Other: No significant soft tissue abnormalities are seen. IMPRESSION: Unremarkable noncontrast CT of the head. Electronically Signed   By: Garald Balding M.D.   On: 04/15/2016 23:14   Mr Brain Wo Contrast  Result Date: 04/15/2016 CLINICAL DATA:  Numbness over the entire body. Incontinence beginning 3 days ago. Metastatic breast cancer. EXAM: MRI HEAD WITHOUT CONTRAST TECHNIQUE: Multiplanar, multiecho pulse sequences of the brain and surrounding structures were obtained without intravenous contrast. COMPARISON:  None. FINDINGS: Diffusion imaging does not show any acute or subacute infarction. The brainstem and cerebellum are normal. Cerebral hemispheres are normal except for some low level FLAIR and T2 signal along the surface of the gyri in the left frontoparietal junction region. No restricted diffusion is noted in that area. There is probably some adjacent dural thickening. The patient is seen Tomi Bamberger osseous metastatic disease throughout the calvarium, the skullbase in the upper cervical spine. 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 The patient would not allow additional scanning and contrast-enhanced imaging is not performed. No hydrocephalus. No primarily intraparenchymal metastatic lesion is suspected. Minimal small-vessel changes affect the white matter. No pituitary mass. Major vessels at the base of the brain show flow. No inflammatory sinus disease. IMPRESSION: Osseous metastatic disease affecting the calvarium, skullbase and upper cervical spine. No evidence of parenchymal metastatic lesion. Dural  thickening in the left frontoparietal region with some abnormal signal along the surface of the brain in the left frontoparietal region. 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 Unfortunately, the patient would not allow completion of the exam or other scans. Electronically Signed   By: Nelson Chimes M.D.   On: 04/15/2016 22:10   Mr Cervical Spine W Or Wo Contrast  Result Date: 04/16/2016 CLINICAL DATA:  Numbness  involving the whole body. History of metastatic breast cancer. EXAM: MRI TOTAL SPINE WITHOUT AND WITH CONTRAST TECHNIQUE: Multisequence MR imaging of the spine from the cervical spine to the sacrum was performed prior to and following IV contrast administration for evaluation of spinal metastatic disease. CONTRAST:  58mL MULTIHANCE GADOBENATE DIMEGLUMINE 529 MG/ML IV SOLN COMPARISON:  10/03/2014 thoracic spine MRI FINDINGS: MRI CERVICAL SPINE FINDINGS Alignment: Normal Vertebrae: Every cervical vertebra has infiltrating marrow lesion consistent with metastatic disease. Metastasis also seen in the inferior clivus. Thoracic spine disease reported below. There is a C3 superior endplate fracture that is chronic based on lack of marrow edema. There is no noted epidural or foraminal infiltration. Cord: Normal signal and morphology. Posterior Fossa, vertebral arteries, paraspinal tissues: Negative. Disc levels:  No notable degenerative change or impingement. MRI THORACIC SPINE FINDINGS Alignment:  Normal Vertebrae: Progressively confluent metastases throughout all the thoracic vertebrae. There are pathologic fractures involving the T3 superior endplate, T6 inferior endplate, T11 inferior endplate, and T12 inferior endplate. These have progressed from prior but show no marrow edema to suggest unhealed fracture. Tumor infiltration into the epidural space described previously is not confidently seen today. Cord:  Normal signal and morphology. Paraspinal and other soft tissues: Known  liver metastases. Diffuse rib metastases. Disc levels: No degenerative impingement MRI LUMBAR SPINE FINDINGS Segmentation:  Standard. Alignment:  Physiologic. Vertebrae: Progressively confluent metastases at each level with L2, L3, L5, and S1 superior endplate fractures that have a chronic appearance. No visible epidural or foraminal infiltration. Conus medullaris: Extends to the L1-2 disc level and appears normal. No abnormal cauda equina enhancement Paraspinal and other soft tissues: Sacral and ileal metastases. Disc levels: No degenerative stenosis or impingement. IMPRESSION: 1. Widespread osseous metastases, present at every spinal level, progressed from 2016 MRI. No noted epidural or foraminal infiltration. No evidence of intrathecal metastasis. 2. Numerous chronic endplate fractures are described above. No acute fracture noted. Electronically Signed   By: Monte Fantasia M.D.   On: 04/16/2016 15:58   Mr Thoracic Spine W Wo Contrast  Result Date: 04/16/2016 CLINICAL DATA:  Numbness involving the whole body. History of metastatic breast cancer. EXAM: MRI TOTAL SPINE WITHOUT AND WITH CONTRAST TECHNIQUE: Multisequence MR imaging of the spine from the cervical spine to the sacrum was performed prior to and following IV contrast administration for evaluation of spinal metastatic disease. CONTRAST:  65mL MULTIHANCE GADOBENATE DIMEGLUMINE 529 MG/ML IV SOLN COMPARISON:  10/03/2014 thoracic spine MRI FINDINGS: MRI CERVICAL SPINE FINDINGS Alignment: Normal Vertebrae: Every cervical vertebra has infiltrating marrow lesion consistent with metastatic disease. Metastasis also seen in the inferior clivus. Thoracic spine disease reported below. There is a C3 superior endplate fracture that is chronic based on lack of marrow edema. There is no noted epidural or foraminal infiltration. Cord: Normal signal and morphology. Posterior Fossa, vertebral arteries, paraspinal tissues: Negative. Disc levels:  No notable degenerative  change or impingement. MRI THORACIC SPINE FINDINGS Alignment:  Normal Vertebrae: Progressively confluent metastases throughout all the thoracic vertebrae. There are pathologic fractures involving the T3 superior endplate, T6 inferior endplate, T11 inferior endplate, and T12 inferior endplate. These have progressed from prior but show no marrow edema to suggest unhealed fracture. Tumor infiltration into the epidural space described previously is not confidently seen today. Cord:  Normal signal and morphology. Paraspinal and other soft tissues: Known liver metastases. Diffuse rib metastases. Disc levels: No degenerative impingement MRI LUMBAR SPINE FINDINGS Segmentation:  Standard. Alignment:  Physiologic. Vertebrae: Progressively confluent metastases at each level with  L2, L3, L5, and S1 superior endplate fractures that have a chronic appearance. No visible epidural or foraminal infiltration. Conus medullaris: Extends to the L1-2 disc level and appears normal. No abnormal cauda equina enhancement Paraspinal and other soft tissues: Sacral and ileal metastases. Disc levels: No degenerative stenosis or impingement. IMPRESSION: 1. Widespread osseous metastases, present at every spinal level, progressed from 2016 MRI. No noted epidural or foraminal infiltration. No evidence of intrathecal metastasis. 2. Numerous chronic endplate fractures are described above. No acute fracture noted. Electronically Signed   By: Monte Fantasia M.D.   On: 04/16/2016 15:58   Mr Lumbar Spine W Wo Contrast  Result Date: 04/16/2016 CLINICAL DATA:  Numbness involving the whole body. History of metastatic breast cancer. EXAM: MRI TOTAL SPINE WITHOUT AND WITH CONTRAST TECHNIQUE: Multisequence MR imaging of the spine from the cervical spine to the sacrum was performed prior to and following IV contrast administration for evaluation of spinal metastatic disease. CONTRAST:  46mL MULTIHANCE GADOBENATE DIMEGLUMINE 529 MG/ML IV SOLN COMPARISON:   10/03/2014 thoracic spine MRI FINDINGS: MRI CERVICAL SPINE FINDINGS Alignment: Normal Vertebrae: Every cervical vertebra has infiltrating marrow lesion consistent with metastatic disease. Metastasis also seen in the inferior clivus. Thoracic spine disease reported below. There is a C3 superior endplate fracture that is chronic based on lack of marrow edema. There is no noted epidural or foraminal infiltration. Cord: Normal signal and morphology. Posterior Fossa, vertebral arteries, paraspinal tissues: Negative. Disc levels:  No notable degenerative change or impingement. MRI THORACIC SPINE FINDINGS Alignment:  Normal Vertebrae: Progressively confluent metastases throughout all the thoracic vertebrae. There are pathologic fractures involving the T3 superior endplate, T6 inferior endplate, T11 inferior endplate, and T12 inferior endplate. These have progressed from prior but show no marrow edema to suggest unhealed fracture. Tumor infiltration into the epidural space described previously is not confidently seen today. Cord:  Normal signal and morphology. Paraspinal and other soft tissues: Known liver metastases. Diffuse rib metastases. Disc levels: No degenerative impingement MRI LUMBAR SPINE FINDINGS Segmentation:  Standard. Alignment:  Physiologic. Vertebrae: Progressively confluent metastases at each level with L2, L3, L5, and S1 superior endplate fractures that have a chronic appearance. No visible epidural or foraminal infiltration. Conus medullaris: Extends to the L1-2 disc level and appears normal. No abnormal cauda equina enhancement Paraspinal and other soft tissues: Sacral and ileal metastases. Disc levels: No degenerative stenosis or impingement. IMPRESSION: 1. Widespread osseous metastases, present at every spinal level, progressed from 2016 MRI. No noted epidural or foraminal infiltration. No evidence of intrathecal metastasis. 2. Numerous chronic endplate fractures are described above. No acute fracture  noted. Electronically Signed   By: Monte Fantasia M.D.   On: 04/16/2016 15:58      IMPRESSION/PLAN: 1.  Metastatic breast cancer with leptomeningeal disease. The patient has finished radiotherapy and appears to be doing better since additional steroids have been given at the hospital. She is reticent to continue high dose steroids. I have encouraged her to take her medication if it has made her neurologic complaints subside. We will follow up with the results of her MRI. Dr. [name] has also discussed that an LP could help determine if she needs intrathecal treatment, if this is even an option. We will defer this to Dr. Marko Plume. With her current dose of dexamethasone at 4mg  QID, I would suggest trying to switch to TID tomorrow, then BID. We will continue to discuss taper dosing.     Carola Rhine, PAC

## 2016-04-16 NOTE — Progress Notes (Signed)
Patient refusing am labs. Pt wants blood drawn from peripheral IV. Will continue to monitor closely.

## 2016-04-16 NOTE — Telephone Encounter (Signed)
MRI report received from [facility]. Copy to HIM. Copy to Dr Marko Plume.

## 2016-04-16 NOTE — H&P (Signed)
History and Physical    Jill Johnston [id number] DOB: January 23, 1966 DOA: 04/15/2016  PCP: Audree Camel, MD  Patient coming from: Home.  Chief Complaint: Numbness involving the whole body.  HPI: Jill Johnston is a 50 y.o. female with metastatic breast cancer who had just recently completed a course of cranial irradiation for metastatic cancer last week and is on tapering dose of steroids started experiencing numbness involving the whole body last few days. Denies any weakness of upper or lower extremity or any incontinence of urine or bowels. MRI brain did not show anything acute. ER physician had discussed with on-call radiation oncologist who advised patient to get MRI of the entire spine. And patient was also started on IV Decadron. On exam patient appears nonfocal. Denies any chest pain or shortness of breath.   ED Course: MRI brain did not show anything acute.  Review of Systems: As per HPI, rest all negative.   Past Medical History:  Diagnosis Date  . Anxiety   . 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 Breast cancer metastasized to multiple sites ([place]) 04/12/2016  . GERD (gastroesophageal reflux disease)     Past Surgical History:  Procedure Laterality Date  . CHOLECYSTECTOMY    . ESOPHAGOGASTRODUODENOSCOPY (EGD) WITH PROPOFOL N/A 07/12/2015   Procedure: ESOPHAGOGASTRODUODENOSCOPY (EGD) WITH PROPOFOL;  Surgeon: Rogene Houston, MD;  Location: [facility];  Service: Endoscopy;  Laterality: N/A;  1:00  . MASTECTOMY  April 2009   Left breast with lymph node resection     reports that she has never smoked. She has never used smokeless tobacco. She reports that she does not drink alcohol or use drugs.  No Known Allergies  Family History  Problem Relation Age of Onset  . Heart attack Mother     Died age 63  . Heart disease Father     Died age 19  . Brain cancer Father     Prior to  Admission medications   Medication Sig Start Date End Date Taking? Authorizing Provider  dexamethasone (DECADRON) 4 MG tablet Take one tablet po q 6 hours. Taper instructions to follow Patient taking differently: 4 mg 2 (two) times daily. Take one tablet po q 6 hours. Taper instructions to follow 03/23/16  Yes Hayden Pedro, PA-C  levETIRAcetam (KEPPRA) 1000 MG tablet Take 1 tablet (1,000 mg total) by mouth 2 (two) times daily. 03/23/16  Yes Hayden Pedro, PA-C  lisinopril (PRINIVIL,ZESTRIL) 10 MG tablet Take 1 tablet (10 mg total) by mouth daily. 01/23/16  Yes Lennis Marion Downer, MD  LORazepam (ATIVAN) 0.5 MG tablet Take 1-2 tablets (0.5-1 mg total) by mouth every 6 (six) hours as needed for anxiety (or nausea). 01/23/16  Yes Lennis P Livesay, MD  ondansetron (ZOFRAN) 8 MG tablet Take 1 tablet (8 mg total) by mouth every 8 (eight) hours as needed for nausea or vomiting. 04/01/16  Yes Hayden Pedro, PA-C  oxyCODONE (OXYCONTIN) 30 MG 12 hr tablet Take 30 mg by mouth 2 (two) times daily. 04/10/16  Yes Lennis Marion Downer, MD  Oxycodone HCl 10 MG TABS Take 1 tablet by mouth every 3 (three) hours as needed for pain. 11/29/15  Yes Historical Provider, MD  pantoprazole (PROTONIX) 40 MG tablet Take 1 tablet (40 mg total) by mouth 2 (two) times daily before a meal. 01/06/16  Yes Butch Penny, NP  polyethylene glycol (MIRALAX / GLYCOLAX) packet Take 17  grams by mouth every day. 10/11/14  Yes Historical Provider, MD  Diphenhyd-Hydrocort-Nystatin (FIRST-DUKES MOUTHWASH) SUSP Use as directed 15 mLs in the mouth or throat 4 (four) times daily as needed. Patient not taking: Reported on 04/15/2016 04/01/16   Hayden Pedro, PA-C  fluconazole (DIFLUCAN) 100 MG tablet Take 1 tablet (100 mg total) by mouth daily. Patient not taking: Reported on 04/15/2016 04/09/16   Gordy Levan, MD    Physical Exam: Vitals:   04/15/16 2033 04/15/16 2319 04/16/16 0100 04/16/16 0107  BP: (!) 131/103 (!)  148/101  (!) 148/105  Pulse: 84 100  100  Resp: 20 18  18   Temp:    97.5 F (36.4 C)  TempSrc:    Oral  SpO2: 94% 95%    Weight:   69 kg (152 lb 3.2 oz)   Height:   5\' 5"  (1.651 m)       Constitutional: Moderately built and nourished. Vitals:   04/15/16 2033 04/15/16 2319 04/16/16 0100 04/16/16 0107  BP: (!) 131/103 (!) 148/101  (!) 148/105  Pulse: 84 100  100  Resp: 20 18  18   Temp:    97.5 F (36.4 C)  TempSrc:    Oral  SpO2: 94% 95%    Weight:   69 kg (152 lb 3.2 oz)   Height:   5\' 5"  (1.651 m)    Eyes: Anicteric no pallor. ENMT: No discharge from the ears eyes nose or mouth. Neck: No mass felt. No neck rigidity. Respiratory: No rhonchi or crepitations. Cardiovascular: S1 and S2 heard. No murmurs appreciated. Abdomen: Soft nontender bowel sounds present. No guarding or rigidity. Musculoskeletal: No edema. No joint effusion. Skin: No rash. Skin appears warm. Neurologic: Alert awake oriented to time place and person. Moves all extremities 5 x 5. No facial asymmetry. Tongue is midline. Psychiatric: Appears normal. Normal affect.   Labs on Admission: I have personally reviewed following labs and imaging studies  CBC:  Recent Labs Lab 04/10/16 0915 04/15/16 2109  WBC 7.1 5.4  NEUTROABS 6.0 4.5  HGB 15.0 14.6  HCT 45.7 44.1  MCV 105.3* 104.3*  PLT 194 [id number]*   Basic Metabolic Panel:  Recent Labs Lab 04/10/16 0916 04/15/16 2109  NA 132* 136  K 4.7 4.4  CL  --  99*  CO2 27 29  GLUCOSE 91 120*  BUN 21.4 19  CREATININE 0.8 0.78  CALCIUM 9.3 9.0   GFR: Estimated Creatinine Clearance: 82.1 mL/min (by C-G formula based on SCr of 0.78 mg/dL). Liver Function Tests:  Recent Labs Lab 04/10/16 0916 04/15/16 2109  AST 41* 48*  ALT 88* 96*  ALKPHOS 108 89  BILITOT 0.58 0.7  PROT 6.8 6.4*  ALBUMIN 3.2* 3.4*    Recent Labs Lab 04/15/16 2109  LIPASE 19   No results for input(s): AMMONIA in the last 168 hours. Coagulation Profile: No results for  input(s): INR, PROTIME in the last 168 hours. Cardiac Enzymes: No results for input(s): CKTOTAL, CKMB, CKMBINDEX, TROPONINI in the last 168 hours. BNP (last 3 results) No results for input(s): PROBNP in the last 8760 hours. HbA1C: No results for input(s): HGBA1C in the last 72 hours. CBG: No results for input(s): GLUCAP in the last 168 hours. Lipid Profile: No results for input(s): CHOL, HDL, LDLCALC, TRIG, CHOLHDL, LDLDIRECT in the last 72 hours. Thyroid Function Tests: No results for input(s): TSH, T4TOTAL, FREET4, T3FREE, THYROIDAB in the last 72 hours. Anemia Panel: No results for input(s): VITAMINB12, FOLATE, FERRITIN, TIBC, IRON, RETICCTPCT  in the last 72 hours. Urine analysis:    Component Value Date/Time   COLORURINE YELLOW 04/15/2016 1943   APPEARANCEUR CLEAR 04/15/2016 1943   LABSPEC 1.015 04/15/2016 1943   PHURINE 6.0 04/15/2016 1943   GLUCOSEU NEGATIVE 04/15/2016 [place] 04/15/2016 [place] 04/15/2016 [place] 04/15/2016 1943   PROTEINUR 30 (A) 04/15/2016 1943   NITRITE NEGATIVE 04/15/2016 1943   LEUKOCYTESUR NEGATIVE 04/15/2016 1943   Sepsis Labs: @LABRCNTIP (procalcitonin:4,lacticidven:4) )No results found for this or any previous visit (from the past 240 hour(s)).   Radiological Exams on Admission: Ct Head Wo Contrast  Result Date: 04/15/2016 CLINICAL DATA:  Acute onset of generalized numbness and incontinence. Intermittent headache. Initial encounter. EXAM: CT HEAD WITHOUT CONTRAST TECHNIQUE: Contiguous axial images were obtained from the base of the skull through the vertex without intravenous contrast. COMPARISON:  MRI of the brain performed earlier today at 9:16 p.m. FINDINGS: Brain: No evidence of acute infarction, hemorrhage, hydrocephalus, extra-axial collection or mass lesion/mass effect. The posterior fossa, including the cerebellum, brainstem and fourth ventricle, is within normal limits. The third and  lateral ventricles, and basal ganglia are unremarkable in appearance. The cerebral hemispheres are symmetric in appearance, with normal gray-white differentiation. No mass effect or midline shift is seen. Vascular: No hyperdense vessel or unexpected calcification. Skull: There is no evidence of fracture; visualized osseous structures are unremarkable in appearance. Sinuses/Orbits: The visualized portions of the orbits are within normal limits. The paranasal sinuses and mastoid air cells are well-aerated. Other: No significant soft tissue abnormalities are seen. IMPRESSION: Unremarkable noncontrast CT of the head. Electronically Signed   By: Garald Balding M.D.   On: 04/15/2016 23:14   Mr Brain Wo Contrast  Result Date: 04/15/2016 CLINICAL DATA:  Numbness over the entire body. Incontinence beginning 3 days ago. Metastatic breast cancer. EXAM: MRI HEAD WITHOUT CONTRAST TECHNIQUE: Multiplanar, multiecho pulse sequences of the brain and surrounding structures were obtained without intravenous contrast. COMPARISON:  None. FINDINGS: Diffusion imaging does not show any acute or subacute infarction. The brainstem and cerebellum are normal. Cerebral hemispheres are normal except for some low level FLAIR and T2 signal along the surface of the gyri in the left frontoparietal junction region. No restricted diffusion is noted in that area. There is probably some adjacent dural thickening. The patient is seen Tomi Bamberger osseous metastatic disease throughout the calvarium, the skullbase in the upper cervical spine. 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 The patient would not allow additional scanning and contrast-enhanced imaging is not performed. No hydrocephalus. No primarily intraparenchymal metastatic lesion is suspected. Minimal small-vessel changes affect the white matter. No pituitary mass. Major vessels at the base of the brain show flow. No inflammatory sinus  disease. IMPRESSION: Osseous metastatic disease affecting the calvarium, skullbase and upper cervical spine. No evidence of parenchymal metastatic lesion. Dural thickening in the left frontoparietal region with some abnormal signal along the surface of the brain in the left frontoparietal region. 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 Unfortunately, the patient would not allow completion of the exam or other scans. Electronically Signed   By: Nelson Chimes M.D.   On: 04/15/2016 22:10     Assessment/Plan Principal Problem:   Numbness Active Problems:   Breast cancer metastasized to multiple sites [facility])   Essential hypertension    1. Numbness involving the whole body with history of metastatic breast cancer recently received cranial radiation - ER  physician had discussed with on-call radiation oncologist who has advised to get MRI of the entire spine with and without contrast and increase patient's Decadron dose to IV. Patient is receiving Decadron every 6 hourly now. Continue Keppra and follow MRI results and further recommendations based on MRI results. 2. Hypertension - patient is on lisinopril closely follow blood pressure trends.   DVT prophylaxis: SCDs. Code Status: Full code.  Family Communication: Discussed with patient.  Disposition Plan: Home.  Consults called: None.  Admission status: Inpatient.    Rise Patience MD [facility] Pager [phone].  If 7PM-7AM, please contact night-coverage [email or web address] Password [email or web address]  04/16/2016, 3:07 AM

## 2016-04-16 NOTE — Telephone Encounter (Signed)
Called husband to find out what hospital in [place] MRI was done at. [place] said [facility]

## 2016-04-16 NOTE — Progress Notes (Signed)
April 16, 2016, 6:02 PM Medical Oncology  Hospital day 2 Antibiotics: day 1 diflucan Chemotherapy: Gemzar planned after [facility] placed  Appreciate Dr Erlinda Hong letting me know of admission on [id number] with complications from extensively metastatic breast cancer. Neurologic complaints have improved with increased decadron, presently 4 mg IV q 6 hr.   EMR and MRI information reviewed; patient seen now with husband here.   Subjective:  Since beginning IV decadron, patient reports improvement in generalized loss of sensation that she developed over some hours prior to admission. She denies HA and has had no seizure activity. She has high pain threshold, however denies any pain including neck/ spine/ ribs now. She has altered taste and sore throat/ mouth with recurrent thrush. She has been able to walk to BR without difficulty and is eating. Bowels have moved. She denies SOB or cough. She denies weakness in extremities. Peripheral IV is not uncomfortable.   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 The breast cancer was ER + 100%, PR 1%, HER 2 IHC/FISH negative, Ki67 52%, BRCA 1/2 negative, initial CA 2729 WNL. She had neoadjuvant AC x4 taxol x12 dose dense from 06-10-2007 thru 09-28-2007 by Dr B.Darovsky. 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 She had radiation to chest wall, supraclavicular region and left axilla 50.4 Gy with boost to mastectomy scar to 60.4 Gy, by Dr [name] from 12-15-2007 thru 01-31-2008. She was on tamoxifen from 02-20-2008 thru 09-25-2014. She developed pain in ribs 08-2014, with CA  2729 52 and PET CT and MRI with diffuse bone metastatic disease, no cord compression, small lung lesions and intrathoracic adenopathy. 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 She began Denosumab 120 mcg monthly starting 4-7-201, and letrozole + palbociclib beginning4-12-2014. She was seen in consultation by Dr [name] [phone]. Palbociclib dose decreased to 100 mg daily x 21 q 28 days due to neutropenia. CBC on 10-25-15: WBC 2.1, Hgb 13, plt 186, ANC 0.9  PET 09-25-2014 in [facility] and  CT CAP at [facility] 10-25-15 with no clear pulmonary involvement, stable 2 cm lesion at dome of liver and 11 mm left hepatic lobe lesion. She had right tomo mammogram at [facility] 09-17-15, with heterogeneously dense breast tissue but no other mammographic findings of concern.  CA 2729 increased from 38 in 08-2015 to 49 in April 2017 and 65 in May 2017. CT CAP / PET 03-16-16 showed progression lung, liver and bone. Letrozole and ibrance DCd on 03-19-16, with plans to begin chemotherapy. She had acute neurologic symptoms 03-21-16 in [place], with MRI MRI head  reportedly showed left parietal metastasis and concern for leptomeningeal spread. She elected whole brain RT, given by Dr [name] ~ 03-23-16 thru 04-10-16.    She had evaluation for vaginal bleeding "from polyp", follows yearly with gynecologist in [place], up to date   Objective: Vital signs in last 24 hours: Blood pressure (!) 125/92, pulse 97, temperature 98.1 F (36.7 C), temperature source Oral, resp. rate 16, height 5' 5"  (1.651 m), weight 152 lb 3.2 oz (69 kg), SpO2 97 %. Awake, alert, talkative, descriptive  historian. Looks comfortable in bed on RA. Speech fluent. No complete alopecia.Face slightly puffy c/w steroids. Not icteric. PERRL. Oral mucosa moist with patchy candida posterior pharynx, tongue, buccal mucosa. No JVD. Lungs without wheezes or rales. Heart RRR, no gallop. Abdomen soft,  not tender including epigastrium, few BS. LE no edema, cords, tenderness. Peripheral IV right forearm site ok. Moves all extremities equally. Light touch intact. No tremor. Skin no rash.   Intake/Output from previous day: 10/18 0701 - 10/19 0700 In: 120 [P.O.:120] Out: -  Intake/Output this shift: Total I/O In: 1133.8 [P.O.:600; I.V.:533.8] Out: -   Physical exam:  Lab Results:  Recent Labs  04/15/16 2109  WBC 5.4  HGB 14.6  HCT 44.1  PLT 139*  ANC 4.5 MCV 104  BMET  Recent Labs  04/15/16 2109  NA 136  K 4.4  CL 99*  CO2 29  GLUCOSE 120*  BUN 19  CREATININE 0.78  CALCIUM 9.0  Tprot 6.4, alb 3.4, AST 48, ALT 96, AP 89, Tbili 0.7  Studies/Results: Ct Head Wo Contrast  Result Date: 04/15/2016 CLINICAL DATA:  Acute onset of generalized numbness and incontinence. Intermittent headache. Initial encounter. EXAM: CT HEAD WITHOUT CONTRAST TECHNIQUE: Contiguous axial images were obtained from the base of the skull through the vertex without intravenous contrast. COMPARISON:  MRI of the brain performed earlier today at 9:16 p.m. FINDINGS: Brain: No evidence of acute infarction, hemorrhage, hydrocephalus, extra-axial collection or mass lesion/mass effect. The posterior fossa, including the cerebellum, brainstem and fourth ventricle, is within normal limits. The third and lateral ventricles, and basal ganglia are unremarkable in appearance. The cerebral hemispheres are symmetric in appearance, with normal gray-white differentiation. No mass effect or midline shift is seen. Vascular: No hyperdense vessel or unexpected calcification. Skull: There is no evidence of fracture; visualized osseous structures are unremarkable in appearance. Sinuses/Orbits: The visualized portions of the orbits are within normal limits. The paranasal sinuses and mastoid air cells are well-aerated. Other: No significant soft tissue abnormalities are seen. IMPRESSION: Unremarkable noncontrast CT of the head.  Electronically Signed   By: Garald Balding M.D.   On: 04/15/2016 23:14   Mr Brain Wo Contrast  Result Date: 04/15/2016 CLINICAL DATA:  Numbness over the entire body. Incontinence beginning 3 days ago. Metastatic breast cancer. EXAM: MRI HEAD WITHOUT CONTRAST TECHNIQUE: Multiplanar, multiecho pulse sequences of the brain and surrounding structures were obtained without intravenous contrast. COMPARISON:  None. FINDINGS: Diffusion imaging does not show any acute or subacute infarction. The brainstem and cerebellum are normal. Cerebral hemispheres are normal except for some low level FLAIR and T2 signal along the surface of the gyri in the left frontoparietal junction region. No restricted diffusion is noted in that area. There is probably some adjacent dural thickening. The patient is seen Tomi Bamberger osseous metastatic disease throughout the calvarium, the skullbase in the upper cervical spine. 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 The patient would not allow additional scanning and contrast-enhanced imaging is not performed. No hydrocephalus. No primarily intraparenchymal metastatic lesion is suspected. Minimal small-vessel changes affect the white matter. No pituitary mass. Major vessels at the base of the brain show flow. No inflammatory sinus disease. IMPRESSION: Osseous metastatic disease affecting the calvarium, skullbase and upper cervical spine. No evidence of parenchymal metastatic lesion. Dural thickening in the left frontoparietal region with some abnormal signal along the surface of the brain in the left frontoparietal region. 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 Unfortunately,  the patient would not allow completion of the exam or other scans. Electronically Signed   By: Nelson Chimes M.D.   On: 04/15/2016 22:10   Mr Cervical Spine W Or Wo Contrast  Result Date: 04/16/2016 CLINICAL DATA:  Numbness involving  the whole body. History of metastatic breast cancer. EXAM: MRI TOTAL SPINE WITHOUT AND WITH CONTRAST TECHNIQUE: Multisequence MR imaging of the spine from the cervical spine to the sacrum was performed prior to and following IV contrast administration for evaluation of spinal metastatic disease. CONTRAST:  3m MULTIHANCE GADOBENATE DIMEGLUMINE 529 MG/ML IV SOLN COMPARISON:  10/03/2014 thoracic spine MRI FINDINGS: MRI CERVICAL SPINE FINDINGS Alignment: Normal Vertebrae: Every cervical vertebra has infiltrating marrow lesion consistent with metastatic disease. Metastasis also seen in the inferior clivus. Thoracic spine disease reported below. There is a C3 superior endplate fracture that is chronic based on lack of marrow edema. There is no noted epidural or foraminal infiltration. Cord: Normal signal and morphology. Posterior Fossa, vertebral arteries, paraspinal tissues: Negative. Disc levels:  No notable degenerative change or impingement. MRI THORACIC SPINE FINDINGS Alignment:  Normal Vertebrae: Progressively confluent metastases throughout all the thoracic vertebrae. There are pathologic fractures involving the T3 superior endplate, T6 inferior endplate, T11 inferior endplate, and T12 inferior endplate. These have progressed from prior but show no marrow edema to suggest unhealed fracture. Tumor infiltration into the epidural space described previously is not confidently seen today. Cord:  Normal signal and morphology. Paraspinal and other soft tissues: Known liver metastases. Diffuse rib metastases. Disc levels: No degenerative impingement MRI LUMBAR SPINE FINDINGS Segmentation:  Standard. Alignment:  Physiologic. Vertebrae: Progressively confluent metastases at each level with L2, L3, L5, and S1 superior endplate fractures that have a chronic appearance. No visible epidural or foraminal infiltration. Conus medullaris: Extends to the L1-2 disc level and appears normal. No abnormal cauda equina enhancement  Paraspinal and other soft tissues: Sacral and ileal metastases. Disc levels: No degenerative stenosis or impingement. IMPRESSION: 1. Widespread osseous metastases, present at every spinal level, progressed from 2016 MRI. No noted epidural or foraminal infiltration. No evidence of intrathecal metastasis. 2. Numerous chronic endplate fractures are described above. No acute fracture noted. Electronically Signed   By: JMonte FantasiaM.D.   On: 04/16/2016 15:58   Mr Thoracic Spine W Wo Contrast  Result Date: 04/16/2016 CLINICAL DATA:  Numbness involving the whole body. History of metastatic breast cancer. EXAM: MRI TOTAL SPINE WITHOUT AND WITH CONTRAST TECHNIQUE: Multisequence MR imaging of the spine from the cervical spine to the sacrum was performed prior to and following IV contrast administration for evaluation of spinal metastatic disease. CONTRAST:  173mMULTIHANCE GADOBENATE DIMEGLUMINE 529 MG/ML IV SOLN COMPARISON:  10/03/2014 thoracic spine MRI FINDINGS: MRI CERVICAL SPINE FINDINGS Alignment: Normal Vertebrae: Every cervical vertebra has infiltrating marrow lesion consistent with metastatic disease. Metastasis also seen in the inferior clivus. Thoracic spine disease reported below. There is a C3 superior endplate fracture that is chronic based on lack of marrow edema. There is no noted epidural or foraminal infiltration. Cord: Normal signal and morphology. Posterior Fossa, vertebral arteries, paraspinal tissues: Negative. Disc levels:  No notable degenerative change or impingement. MRI THORACIC SPINE FINDINGS Alignment:  Normal Vertebrae: Progressively confluent metastases throughout all the thoracic vertebrae. There are pathologic fractures involving the T3 superior endplate, T6 inferior endplate, T11 inferior endplate, and T12 inferior endplate. These have progressed from prior but show no marrow edema to suggest unhealed fracture. Tumor infiltration into the epidural space described previously is not  confidently seen today. Cord:  Normal signal and morphology. Paraspinal and other soft tissues: Known liver metastases. Diffuse rib metastases. Disc levels: No degenerative impingement MRI LUMBAR SPINE FINDINGS Segmentation:  Standard. Alignment:  Physiologic. Vertebrae: Progressively confluent metastases at each level with L2, L3, L5, and S1 superior endplate fractures that have a chronic appearance. No visible epidural or foraminal infiltration. Conus medullaris: Extends to the L1-2 disc level and appears normal. No abnormal cauda equina enhancement Paraspinal and other soft tissues: Sacral and ileal metastases. Disc levels: No degenerative stenosis or impingement. IMPRESSION: 1. Widespread osseous metastases, present at every spinal level, progressed from 2016 MRI. No noted epidural or foraminal infiltration. No evidence of intrathecal metastasis. 2. Numerous chronic endplate fractures are described above. No acute fracture noted. Electronically Signed   By: Monte Fantasia M.D.   On: 04/16/2016 15:58   Mr Lumbar Spine W Wo Contrast  Result Date: 04/16/2016 CLINICAL DATA:  Numbness involving the whole body. History of metastatic breast cancer. EXAM: MRI TOTAL SPINE WITHOUT AND WITH CONTRAST TECHNIQUE: Multisequence MR imaging of the spine from the cervical spine to the sacrum was performed prior to and following IV contrast administration for evaluation of spinal metastatic disease. CONTRAST:  49m MULTIHANCE GADOBENATE DIMEGLUMINE 529 MG/ML IV SOLN COMPARISON:  10/03/2014 thoracic spine MRI FINDINGS: MRI CERVICAL SPINE FINDINGS Alignment: Normal Vertebrae: Every cervical vertebra has infiltrating marrow lesion consistent with metastatic disease. Metastasis also seen in the inferior clivus. Thoracic spine disease reported below. There is a C3 superior endplate fracture that is chronic based on lack of marrow edema. There is no noted epidural or foraminal infiltration. Cord: Normal signal and morphology.  Posterior Fossa, vertebral arteries, paraspinal tissues: Negative. Disc levels:  No notable degenerative change or impingement. MRI THORACIC SPINE FINDINGS Alignment:  Normal Vertebrae: Progressively confluent metastases throughout all the thoracic vertebrae. There are pathologic fractures involving the T3 superior endplate, T6 inferior endplate, T11 inferior endplate, and T12 inferior endplate. These have progressed from prior but show no marrow edema to suggest unhealed fracture. Tumor infiltration into the epidural space described previously is not confidently seen today. Cord:  Normal signal and morphology. Paraspinal and other soft tissues: Known liver metastases. Diffuse rib metastases. Disc levels: No degenerative impingement MRI LUMBAR SPINE FINDINGS Segmentation:  Standard. Alignment:  Physiologic. Vertebrae: Progressively confluent metastases at each level with L2, L3, L5, and S1 superior endplate fractures that have a chronic appearance. No visible epidural or foraminal infiltration. Conus medullaris: Extends to the L1-2 disc level and appears normal. No abnormal cauda equina enhancement Paraspinal and other soft tissues: Sacral and ileal metastases. Disc levels: No degenerative stenosis or impingement. IMPRESSION: 1. Widespread osseous metastases, present at every spinal level, progressed from 2016 MRI. No noted epidural or foraminal infiltration. No evidence of intrathecal metastasis. 2. Numerous chronic endplate fractures are described above. No acute fracture noted. Electronically Signed   By: JMonte FantasiaM.D.   On: 04/16/2016 15:58   PACs images reviewed by MD  [facility] [place] [facility] will [facility] into this EMR; disc is available in radiation oncology.  DISCUSSION Interval history reviewed with patient and husband now. 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   Patient and husband tell me that they discussed CSF evaluation with Dr MTammi Klippel and that patient did not want that  done. I did not discuss intrathecal treatment in any more detail now.  Discussed her wishes regarding resuscitation and life support if situation worsens, as she understands that the metastatic cancer is life limiting. Chest compressions would fracture ribs etc, and she verbalizes that she would not be aware of surroundings if on life support. She also tells me that she does not want to put her family thru more suffering and does not want that aggressive treatment.  She tells me that she felt she was dying when she came into hospital, asks if she might still live "a couple of months". Husband also understood this conversation and her wishes. Code status changed to DNR.  Oncology chaplain likely would be good support; I have sent message to request her help either in hospital or at [facility] later. I did not discuss Palliative Care consult. Note they have a young son.   She does still want to try interventions that might be helpful in short term. She agrees to continuing steroids, which should NOT be tapered now. Radiation oncology is aware of situation; as pain is still controlled and no symptoms of cord compression, she may not need radiation to spine now, tho certainly we are glad for Dr Johny Shears input. She would like to try gemzar, which may give some coverage in CNS; she needs PAC for gemzar, which is scheduled outpatient for 10-25 but may be able to be placed sooner during this admission. I have put order in for [facility] by IR 10-20 if possible and have made her NPO after MN in case IR schedule allows (if not would resume diet). Last bisphosphonate as xgeva on 03-19-16, so will give IV bisphosphonate now.   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 Whole brain radiation completed 04-10-16. Symptoms improved with decadron now 4 mg q 6 hrs; she is willing to continue steroids now. Will give IV bisphosphonate as pamidronate now. Will try gemzar in palliative attempt, which she is willing to start soon if PAC can be placed earlier than 10-25. 2.DNR,  but does want to try some interventions more than just comfort care at least for now 3.oral thrush from steroids: diflucan. WIll treat until cleared then try to maintain on mycelex or nystatin mouthwash while still on steroids. 4.PAC needed for gemzar. Scheduled outpatient for 10-25 but will see if IR can assist sooner- thank you. 5.GERD which has been symptomatic including nocturnal aspiration.  6.Lymphedema LUE: stable  7.BRCA testing negative in 2008.  8..Root canal and dental work for broken tooth 02-24-16, healed without problems. Aaron Edelman resumed 03-19-16 9.pain related to metastatic breast cancer: adequately controlled with present oxycontin.  10. Drug induced leukopenia/ neutropenia related to ibrance resolved off of that agent 11.post cholecystectomy. 12.needs flu vaccine: probably best to give this even on high dose steroids as these will likely be ongoing at least weeks  I will follow with you. Please page me between my rounds if needed [place]  Evlyn Clines MD

## 2016-04-16 NOTE — Telephone Encounter (Signed)
Jill Johnston is wanting to know if Dr Marko Plume will see pt while she is currently in hospital.  Jill Johnston was expressing frustration with getting appts scheduled.  Jill Johnston was asking why he ended up taking pt to the ER days after seeing the MD in the office. This happened twice recently.  S/w Dr Marko Plume and called Jill Johnston back: yes Dr Marko Plume will see pt after the MRI is done, after she finishes seeing patients in the office. Administration will be informed of scheduling problem. Dr Marko Plume will discuss the ER situation when she sees them.   Fax sent to [facility] requesting MRI report

## 2016-04-16 NOTE — Progress Notes (Signed)
PROGRESS NOTE  Carissa Dugo [id number] DOB: [birth year] DOA: 04/15/2016 PCP: Audree Camel, MD  HPI/Recap of past 24 hours:  Patient has been refusing blood draw , she did not allow completion of mri  This morning she report feeling  Better, will try to get mri done  Assessment/Plan: Principal Problem:   Numbness Active Problems:   Breast cancer metastasized to multiple sites [facility])   Essential hypertension   1. Numbness involving the whole body with history of metastatic breast cancer recently received cranial radiation - ER physician had discussed with on-call radiation oncologist who has advised to get MRI of the entire spine with and without contrast and increase patient's Decadron dose to IV. Patient is receiving Decadron every 6 hourly now. Continue Keppra and follow MRI results and further recommendations based on MRI results. 2. Hypertension - patient is on lisinopril closely follow blood pressure trends.   DVT prophylaxis: SCDs. Code Status: Full code.  Family Communication: Discussed with patient.  Disposition Plan: Home.  Consults called:  Oncology Dr Marko Plume ( will see patient this evening) Radiation oncology Dr [name]   Procedures:  none  Antibiotics:  none   Objective: BP 116/81 (BP Location: Right Arm)   Pulse 81   Temp 98.1 F (36.7 C) (Oral)   Resp 18   Ht 5\' 5"  (1.651 m)   Wt 69 kg (152 lb 3.2 oz)   SpO2 93%   BMI 25.33 kg/m   Intake/Output Summary (Last 24 hours) at 04/16/16 0919 Last data filed at 04/16/16 0204  Gross per 24 hour  Intake              120 ml  Output                0 ml  Net              120 ml   Filed Weights   04/15/16 1746 04/16/16 0100  Weight: 70.4 kg (155 lb 2 oz) 69 kg (152 lb 3.2 oz)    Exam:   General:  NAD  Cardiovascular: RRR  Respiratory: CTABL  Abdomen: Soft/ND/NT, positive BS  Musculoskeletal: No Edema  Neuro: aaox3  Data Reviewed: Basic Metabolic Panel:  Recent Labs Lab  04/10/16 0916 04/15/16 2109  NA 132* 136  K 4.7 4.4  CL  --  99*  CO2 27 29  GLUCOSE 91 120*  BUN 21.4 19  CREATININE 0.8 0.78  CALCIUM 9.3 9.0   Liver Function Tests:  Recent Labs Lab 04/10/16 0916 04/15/16 2109  AST 41* 48*  ALT 88* 96*  ALKPHOS 108 89  BILITOT 0.58 0.7  PROT 6.8 6.4*  ALBUMIN 3.2* 3.4*    Recent Labs Lab 04/15/16 2109  LIPASE 19   No results for input(s): AMMONIA in the last 168 hours. CBC:  Recent Labs Lab 04/10/16 0915 04/15/16 2109  WBC 7.1 5.4  NEUTROABS 6.0 4.5  HGB 15.0 14.6  HCT 45.7 44.1  MCV 105.3* 104.3*  PLT 194 139*   Cardiac Enzymes:   No results for input(s): CKTOTAL, CKMB, CKMBINDEX, TROPONINI in the last 168 hours. BNP (last 3 results) No results for input(s): BNP in the last 8760 hours.  ProBNP (last 3 results) No results for input(s): PROBNP in the last 8760 hours.  CBG: No results for input(s): GLUCAP in the last 168 hours.  No results found for this or any previous visit (from the past 240 hour(s)).   Studies: Ct Head Wo Contrast  Result Date: 04/15/2016  CLINICAL DATA:  Acute onset of generalized numbness and incontinence. Intermittent headache. Initial encounter. EXAM: CT HEAD WITHOUT CONTRAST TECHNIQUE: Contiguous axial images were obtained from the base of the skull through the vertex without intravenous contrast. COMPARISON:  MRI of the brain performed earlier today at 9:16 p.m. FINDINGS: Brain: No evidence of acute infarction, hemorrhage, hydrocephalus, extra-axial collection or mass lesion/mass effect. The posterior fossa, including the cerebellum, brainstem and fourth ventricle, is within normal limits. The third and lateral ventricles, and basal ganglia are unremarkable in appearance. The cerebral hemispheres are symmetric in appearance, with normal gray-white differentiation. No mass effect or midline shift is seen. Vascular: No hyperdense vessel or unexpected calcification. Skull: There is no evidence of  fracture; visualized osseous structures are unremarkable in appearance. Sinuses/Orbits: The visualized portions of the orbits are within normal limits. The paranasal sinuses and mastoid air cells are well-aerated. Other: No significant soft tissue abnormalities are seen. IMPRESSION: Unremarkable noncontrast CT of the head. Electronically Signed   By: Garald Balding M.D.   On: 04/15/2016 23:14   Mr Brain Wo Contrast  Result Date: 04/15/2016 CLINICAL DATA:  Numbness over the entire body. Incontinence beginning 3 days ago. Metastatic breast cancer. EXAM: MRI HEAD WITHOUT CONTRAST TECHNIQUE: Multiplanar, multiecho pulse sequences of the brain and surrounding structures were obtained without intravenous contrast. COMPARISON:  None. FINDINGS: Diffusion imaging does not show any acute or subacute infarction. The brainstem and cerebellum are normal. Cerebral hemispheres are normal except for some low level FLAIR and T2 signal along the surface of the gyri in the left frontoparietal junction region. No restricted diffusion is noted in that area. There is probably some adjacent dural thickening. The patient is seen Tomi Bamberger osseous metastatic disease throughout the calvarium, the skullbase in the upper cervical spine. 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 The patient would not allow additional scanning and contrast-enhanced imaging is not performed. No hydrocephalus. No primarily intraparenchymal metastatic lesion is suspected. Minimal small-vessel changes affect the white matter. No pituitary mass. Major vessels at the base of the brain show flow. No inflammatory sinus disease. IMPRESSION: Osseous metastatic disease affecting the calvarium, skullbase and upper cervical spine. No evidence of parenchymal metastatic lesion. Dural thickening in the left frontoparietal region with some abnormal signal along the surface of the brain in the left frontoparietal region.  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 Unfortunately, the patient would not allow completion of the exam or other scans. Electronically Signed   By: Nelson Chimes M.D.   On: 04/15/2016 22:10    Scheduled Meds: . dexamethasone  4 mg Intravenous Q6H  . levETIRAcetam  500 mg Oral BID  . lisinopril  10 mg Oral Daily  . oxyCODONE  30 mg Oral BID  . pantoprazole  40 mg Oral BID AC  . polyethylene glycol  17 g Oral Daily    Continuous Infusions: . sodium chloride 75 mL/hr at 04/16/16 0324      Emmaly Leech MD, PhD  [facility] Pager [phone]. If 7PM-7AM, please contact night-coverage at [email or web address], password [facility] 04/16/2016, 9:19 AM  LOS: 0 days

## 2016-04-16 NOTE — Care Management Note (Signed)
Case Management Note  Patient Details  Name: Jill Johnston MRN: [id number] Date of Birth: [birth year]  Subjective/Objective:50 y/o f admitted w/numbness. From home.                    Action/Plan:d/c home.   Expected Discharge Date:                  Expected Discharge Plan:  Home/Self Care  In-House Referral:     Discharge planning Services  CM Consult  Post Acute Care Choice:    Choice offered to:     DME Arranged:    DME Agency:     HH Arranged:    HH Agency:     Status of Service:  In process, will continue to follow  If discussed at Long Length of Stay Meetings, dates discussed:    Additional Comments:  Dessa Phi, RN 04/16/2016, 4:07 PM

## 2016-04-17 ENCOUNTER — Other Ambulatory Visit: Payer: Self-pay | Admitting: Oncology

## 2016-04-17 ENCOUNTER — Other Ambulatory Visit: Payer: Self-pay

## 2016-04-17 MED ORDER — DEXAMETHASONE SODIUM PHOSPHATE 4 MG/ML IJ SOLN
4.0000 mg | Freq: Once | INTRAMUSCULAR | Status: DC
Start: 2016-04-17 — End: 2016-04-17

## 2016-04-17 MED ORDER — LIDOCAINE-PRILOCAINE 2.5-2.5 % EX CREA: 1.0000 "application " | TOPICAL_CREAM | CUTANEOUS | 1 refills | Status: AC | PRN

## 2016-04-17 MED ORDER — FLUCONAZOLE 100 MG PO TABS: 100.0000 mg | ORAL_TABLET | Freq: Every day | ORAL | 0 refills | Status: DC

## 2016-04-17 MED ORDER — SUCRALFATE 1 G PO TABS
1.0000 g | ORAL_TABLET | Freq: Three times a day (TID) | ORAL | Status: DC
  Administered 2016-04-17: 1 g via ORAL
  Filled 2016-04-17: qty 1

## 2016-04-17 MED ORDER — DEXAMETHASONE 4 MG PO TABS: ORAL_TABLET | ORAL | 0 refills | Status: DC

## 2016-04-17 MED ORDER — NYSTATIN 100000 UNIT/ML MT SUSP: 5.0000 mL | Freq: Four times a day (QID) | OROMUCOSAL | 0 refills | Status: DC

## 2016-04-17 MED ORDER — DEXAMETHASONE 4 MG PO TABS
4.0000 mg | ORAL_TABLET | Freq: Four times a day (QID) | ORAL | Status: DC
Start: 2016-04-17 — End: 2016-04-17
  Administered 2016-04-17: 4 mg via ORAL
  Filled 2016-04-17 (×2): qty 1

## 2016-04-17 NOTE — Progress Notes (Signed)
Medical Oncology April 17, 2016, 8:30 AM  Hospital day 3 Antibiotics: none. Antifungal diflucan day 2 Chemotherapy: gemzar planned outpatient after PAC placed. Pamidronate 04-16-16  Per IR, possible PAC today depending on timing of their first case this AM. IR to let us know after first case if they can work in [facility] today. Will keep NPO until know if procedure to be done today. I have spoken directly with IR staff and IR PA now. As peripheral IV access very difficult, will wait to draw PT INR until we know if PAC to be placed today.   Will change decadron from IV to po when no longer NPO  She would like to go home today either after PAC placed if stable or if PAC cannot be placed until next week. First chemo and appointments at [facility] will be coordinated when we know PAC timing.    Subjective: Husband to come to hospital this AM, is getting their son to school now.  DId not sleep due to steroids. Minimal atypical sensation extremities this AM. No HA.  Is losing hair from recent cranial radiation. Mouth no better/ no worse since first diflucan late 04-16-16. Denies pain. Denies SOB. Face "square, eyes are here somewhere" from steroids. Peripheral IV not uncomfortable - nursing very careful with this site as peripheral access very difficult. No problems with pamidronate last pm. Has been NPO since MN Wants to go home soon, knows that time is very limited and does not want to die in hospital.  Wants to begin chemo as soon as possible next week.  Objective: Vital signs in last 24 hours: Blood pressure 136/89, pulse 72, temperature 98.3 F (36.8 C), temperature source Oral, resp. rate 18, height 5' 5" (1.651 m), weight 152 lb 3.2 oz (69 kg), SpO2 100 %.  Awake alert, oriented and appropriate. NAD sitting in bed, respirations not labored RA. Speech fluent. Losing hair. PERRL, not icteric. Oral mucosa moist, patchy thrush posterior pharynx no worse than last pm. Face puffy c/w steroids. No JVD,  neck supple. Lungs without wheezes or rales, BS bilaterally. Heart RRR no gallop, clear heart sounds. Peripheral IV right forearm locked. Abdomen soft, not tender, slightly distended, some BS. LE no edema, cords, tenderness. Moves all extremities easily. No focal deficits on bed exam. Intake/Output from previous day: 10/19 0701 - 10/20 0700 In: 1643.8 [P.O.:600; I.V.:533.8; IV Piggyback:510] Out: -  Intake/Output this shift: No intake/output data recorded.  No maintenance IVF  Lab Results:  INR needed prior to [facility] placement. Will wait to draw this until we know if PAC can be done today (unless IR needs that information before putting her on schedule).  I have spoken with unit RN and IV therapy now.   Recent Labs  04/15/16 2109  WBC 5.4  HGB 14.6  HCT 44.1  PLT 139*   BMET  Recent Labs  04/15/16 2109  NA 136  K 4.4  CL 99*  CO2 29  GLUCOSE 120*  BUN 19  CREATININE 0.78  CALCIUM 9.0    MEDICATIONS reviewed, on protonix. Not on prophylactic anticoagulation. Will change decadron to 4 mg po with food q 6 hr when not NPO.  Will use diflucan until clear oral thrush, then change to mycelex troches or nystatin mouthwash tid to try to maintain while continuing decadron  Studies/Results: Ct Head Wo Contrast  Result Date: 04/15/2016 CLINICAL DATA:  Acute onset of generalized numbness and incontinence. Intermittent headache. Initial encounter. EXAM: CT HEAD WITHOUT CONTRAST TECHNIQUE: Contiguous axial  images were obtained from the base of the skull through the vertex without intravenous contrast. COMPARISON:  MRI of the brain performed earlier today at 9:16 p.m. FINDINGS: Brain: No evidence of acute infarction, hemorrhage, hydrocephalus, extra-axial collection or mass lesion/mass effect. The posterior fossa, including the cerebellum, brainstem and fourth ventricle, is within normal limits. The third and lateral ventricles, and basal ganglia are unremarkable in appearance. The cerebral  hemispheres are symmetric in appearance, with normal gray-white differentiation. No mass effect or midline shift is seen. Vascular: No hyperdense vessel or unexpected calcification. Skull: There is no evidence of fracture; visualized osseous structures are unremarkable in appearance. Sinuses/Orbits: The visualized portions of the orbits are within normal limits. The paranasal sinuses and mastoid air cells are well-aerated. Other: No significant soft tissue abnormalities are seen. IMPRESSION: Unremarkable noncontrast CT of the head. Electronically Signed   By: Garald Balding M.D.   On: 04/15/2016 23:14   Mr Brain Wo Contrast  Result Date: 04/15/2016 CLINICAL DATA:  Numbness over the entire body. Incontinence beginning 3 days ago. Metastatic breast cancer. EXAM: MRI HEAD WITHOUT CONTRAST TECHNIQUE: Multiplanar, multiecho pulse sequences of the brain and surrounding structures were obtained without intravenous contrast. COMPARISON:  None. FINDINGS: Diffusion imaging does not show any acute or subacute infarction. The brainstem and cerebellum are normal. Cerebral hemispheres are normal except for some low level FLAIR and T2 signal along the surface of the gyri in the left frontoparietal junction region. No restricted diffusion is noted in that area. There is probably some adjacent dural thickening. The patient is seen Tomi Bamberger osseous metastatic disease throughout the calvarium, the skullbase in the upper cervical spine. 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 The patient would not allow additional scanning and contrast-enhanced imaging is not performed. No hydrocephalus. No primarily intraparenchymal metastatic lesion is suspected. Minimal small-vessel changes affect the white matter. No pituitary mass. Major vessels at the base of the brain show flow. No inflammatory sinus disease. IMPRESSION: Osseous metastatic disease affecting the calvarium, skullbase  and upper cervical spine. No evidence of parenchymal metastatic lesion. Dural thickening in the left frontoparietal region with some abnormal signal along the surface of the brain in the left frontoparietal region. 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 Unfortunately, the patient would not allow completion of the exam or other scans. Electronically Signed   By: Nelson Chimes M.D.   On: 04/15/2016 22:10   Mr Cervical Spine W Or Wo Contrast  Result Date: 04/16/2016 CLINICAL DATA:  Numbness involving the whole body. History of metastatic breast cancer. EXAM: MRI TOTAL SPINE WITHOUT AND WITH CONTRAST TECHNIQUE: Multisequence MR imaging of the spine from the cervical spine to the sacrum was performed prior to and following IV contrast administration for evaluation of spinal metastatic disease. CONTRAST:  35m MULTIHANCE GADOBENATE DIMEGLUMINE 529 MG/ML IV SOLN COMPARISON:  10/03/2014 thoracic spine MRI FINDINGS: MRI CERVICAL SPINE FINDINGS Alignment: Normal Vertebrae: Every cervical vertebra has infiltrating marrow lesion consistent with metastatic disease. Metastasis also seen in the inferior clivus. Thoracic spine disease reported below. There is a C3 superior endplate fracture that is chronic based on lack of marrow edema. There is no noted epidural or foraminal infiltration. Cord: Normal signal and morphology. Posterior Fossa, vertebral arteries, paraspinal tissues: Negative. Disc levels:  No notable degenerative change or impingement. MRI THORACIC SPINE FINDINGS Alignment:  Normal Vertebrae: Progressively confluent metastases throughout all the thoracic vertebrae. There are pathologic fractures involving the T3 superior endplate, T6  inferior endplate, T11 inferior endplate, and T12 inferior endplate. These have progressed from prior but show no marrow edema to suggest unhealed fracture. Tumor infiltration into the epidural space described previously is not confidently seen today.  Cord:  Normal signal and morphology. Paraspinal and other soft tissues: Known liver metastases. Diffuse rib metastases. Disc levels: No degenerative impingement MRI LUMBAR SPINE FINDINGS Segmentation:  Standard. Alignment:  Physiologic. Vertebrae: Progressively confluent metastases at each level with L2, L3, L5, and S1 superior endplate fractures that have a chronic appearance. No visible epidural or foraminal infiltration. Conus medullaris: Extends to the L1-2 disc level and appears normal. No abnormal cauda equina enhancement Paraspinal and other soft tissues: Sacral and ileal metastases. Disc levels: No degenerative stenosis or impingement. IMPRESSION: 1. Widespread osseous metastases, present at every spinal level, progressed from 2016 MRI. No noted epidural or foraminal infiltration. No evidence of intrathecal metastasis. 2. Numerous chronic endplate fractures are described above. No acute fracture noted. Electronically Signed   By: Monte Fantasia M.D.   On: 04/16/2016 15:58   Mr Thoracic Spine W Wo Contrast  Result Date: 04/16/2016 CLINICAL DATA:  Numbness involving the whole body. History of metastatic breast cancer. EXAM: MRI TOTAL SPINE WITHOUT AND WITH CONTRAST TECHNIQUE: Multisequence MR imaging of the spine from the cervical spine to the sacrum was performed prior to and following IV contrast administration for evaluation of spinal metastatic disease. CONTRAST:  66m MULTIHANCE GADOBENATE DIMEGLUMINE 529 MG/ML IV SOLN COMPARISON:  10/03/2014 thoracic spine MRI FINDINGS: MRI CERVICAL SPINE FINDINGS Alignment: Normal Vertebrae: Every cervical vertebra has infiltrating marrow lesion consistent with metastatic disease. Metastasis also seen in the inferior clivus. Thoracic spine disease reported below. There is a C3 superior endplate fracture that is chronic based on lack of marrow edema. There is no noted epidural or foraminal infiltration. Cord: Normal signal and morphology. Posterior Fossa, vertebral  arteries, paraspinal tissues: Negative. Disc levels:  No notable degenerative change or impingement. MRI THORACIC SPINE FINDINGS Alignment:  Normal Vertebrae: Progressively confluent metastases throughout all the thoracic vertebrae. There are pathologic fractures involving the T3 superior endplate, T6 inferior endplate, T11 inferior endplate, and T12 inferior endplate. These have progressed from prior but show no marrow edema to suggest unhealed fracture. Tumor infiltration into the epidural space described previously is not confidently seen today. Cord:  Normal signal and morphology. Paraspinal and other soft tissues: Known liver metastases. Diffuse rib metastases. Disc levels: No degenerative impingement MRI LUMBAR SPINE FINDINGS Segmentation:  Standard. Alignment:  Physiologic. Vertebrae: Progressively confluent metastases at each level with L2, L3, L5, and S1 superior endplate fractures that have a chronic appearance. No visible epidural or foraminal infiltration. Conus medullaris: Extends to the L1-2 disc level and appears normal. No abnormal cauda equina enhancement Paraspinal and other soft tissues: Sacral and ileal metastases. Disc levels: No degenerative stenosis or impingement. IMPRESSION: 1. Widespread osseous metastases, present at every spinal level, progressed from 2016 MRI. No noted epidural or foraminal infiltration. No evidence of intrathecal metastasis. 2. Numerous chronic endplate fractures are described above. No acute fracture noted. Electronically Signed   By: JMonte FantasiaM.D.   On: 04/16/2016 15:58   Mr Lumbar Spine W Wo Contrast  Result Date: 04/16/2016 CLINICAL DATA:  Numbness involving the whole body. History of metastatic breast cancer. EXAM: MRI TOTAL SPINE WITHOUT AND WITH CONTRAST TECHNIQUE: Multisequence MR imaging of the spine from the cervical spine to the sacrum was performed prior to and following IV contrast administration for evaluation of spinal metastatic disease.  CONTRAST:  84m MULTIHANCE GADOBENATE DIMEGLUMINE 529 MG/ML IV SOLN COMPARISON:  10/03/2014 thoracic spine MRI FINDINGS: MRI CERVICAL SPINE FINDINGS Alignment: Normal Vertebrae: Every cervical vertebra has infiltrating marrow lesion consistent with metastatic disease. Metastasis also seen in the inferior clivus. Thoracic spine disease reported below. There is a C3 superior endplate fracture that is chronic based on lack of marrow edema. There is no noted epidural or foraminal infiltration. Cord: Normal signal and morphology. Posterior Fossa, vertebral arteries, paraspinal tissues: Negative. Disc levels:  No notable degenerative change or impingement. MRI THORACIC SPINE FINDINGS Alignment:  Normal Vertebrae: Progressively confluent metastases throughout all the thoracic vertebrae. There are pathologic fractures involving the T3 superior endplate, T6 inferior endplate, T11 inferior endplate, and T12 inferior endplate. These have progressed from prior but show no marrow edema to suggest unhealed fracture. Tumor infiltration into the epidural space described previously is not confidently seen today. Cord:  Normal signal and morphology. Paraspinal and other soft tissues: Known liver metastases. Diffuse rib metastases. Disc levels: No degenerative impingement MRI LUMBAR SPINE FINDINGS Segmentation:  Standard. Alignment:  Physiologic. Vertebrae: Progressively confluent metastases at each level with L2, L3, L5, and S1 superior endplate fractures that have a chronic appearance. No visible epidural or foraminal infiltration. Conus medullaris: Extends to the L1-2 disc level and appears normal. No abnormal cauda equina enhancement Paraspinal and other soft tissues: Sacral and ileal metastases. Disc levels: No degenerative stenosis or impingement. IMPRESSION: 1. Widespread osseous metastases, present at every spinal level, progressed from 2016 MRI. No noted epidural or foraminal infiltration. No evidence of intrathecal  metastasis. 2. Numerous chronic endplate fractures are described above. No acute fracture noted. Electronically Signed   By: JMonte FantasiaM.D.   On: 04/16/2016 15:58   DISCUSSION Patient and unit RN aware of coordination with IR for possible PAC placement today. DC considerations as above. She will be high risk for readmission regardless of timing of DC, but, given situation with terminal breast cancer,  I would be in favor of DC today after PAC placed or if unable to get PAC done  if she is able.  She would be candidate for Hospice other than fact that she wants to try palliative gemzar chemo.  I have asked [place] to offer support in hospital or next week in office.  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 She declined CSF evaluation. Whole brain radiation completed 04-10-16. Neurologic symptoms improved with decadron now 4 mg q 6 hrs; she is willing to continue steroids for present. Pamidronate given 04-16-16. Will try gemzar in palliative attempt, which she is willing to start soon if PAC can be placed earlier than 10-25. [facility].  2.DNR,  but wants to try gemzar in palliative attempt. She will be hospice appropriate when treatment discontinued. 3.oral thrush from steroids: diflucan. WIll treat until cleared then try to maintain on mycelex or nystatin mouthwash while still on steroids. 4.PAC needed for gemzar. Scheduled outpatient for 10-25 but will see if IR can assist sooner- thank you. 5.GERD which has been symptomatic including nocturnal aspiration. Continue protonix 6.Lymphedema LUE: stable  7.BRCA testing negative in 2008.  8..Root canal and dental work for broken tooth 02-24-16, healed without problems. XAaron Edelmanresumed 03-19-16, pamidronate given in hospital 04-16-16 9.pain related to metastatic breast cancer: adequately controlled with present oxycontin.  10. Drug  induced leukopenia/ neutropenia related to ibrance resolved off of that agent 11.post cholecystectomy. 12.needs flu vaccine:  probably best to give this even on high dose steroids as these will likely be ongoing at least weeks 13.if stays in hospital would try PAS stockings.   Please page me if need to discuss [phone]. Thank you Evlyn Clines MD

## 2016-04-17 NOTE — Care Management Note (Deleted)
Case Management Note  Patient Details  Name: Jill Johnston MRN: [id number] Date of Birth: Nov 22, 1965  Subjective/Objective:spoke to nurse-states patient needs 2 front wheels rw-[facility] dme rep aware & will deliver to rm w/home rw order.Nsg to inform attending.No further CM needs or orders.                    Action/Plan:d/c home.   Expected Discharge Date:                  Expected Discharge Plan:  Home/Self Care  In-House Referral:     Discharge planning Services  CM Consult  Post Acute Care Choice:    Choice offered to:  Patient  DME Arranged:  [facility] D[facility] Agency:  [place]:    [place]:     Status of Service:  Completed, signed off  If discussed at [place] of Stay Meetings, dates discussed:    Additional Comments:  Dessa Phi, RN 04/17/2016, 2:54 PM

## 2016-04-17 NOTE — Discharge Summary (Signed)
Discharge Summary  Jill Johnston [id number] DOB: 1965-08-31  PCP: Audree Camel, MD  Admit date: 04/15/2016 Discharge date: 04/18/2016  Time spent: <23mins  Recommendations for Outpatient Follow-up:  1. F/u with oncology Dr Marko Plume  Discharge Diagnoses:  Active Hospital Problems   Diagnosis Date Noted  . Numbness 04/16/2016  . Essential hypertension 04/16/2016  . Breast cancer metastasized to multiple sites [facility]) 04/12/2016    Resolved Hospital Problems   Diagnosis Date Noted Date Resolved  No resolved problems to display.    Discharge Condition: stable  Diet recommendation: heart healthy/carb modified  Filed Weights   04/15/16 1746 04/16/16 0100  Weight: 70.4 kg (155 lb 2 oz) 69 kg (152 lb 3.2 oz)    History of present illness:  Chief Complaint: Numbness involving the whole body.  HPI: Jill Johnston is a 50 y.o. female with metastatic breast cancer who had just recently completed a course of cranial irradiation for metastatic cancer last week and is on tapering dose of steroids started experiencing numbness involving the whole body last few days. Denies any weakness of upper or lower extremity or any incontinence of urine or bowels. MRI brain did not show anything acute. ER physician had discussed with on-call radiation oncologist who advised patient to get MRI of the entire spine. And patient was also started on IV Decadron. On exam patient appears nonfocal. Denies any chest pain or shortness of breath.   ED Course: MRI brain did not show anything acute.  Hospital Course:  Principal Problem:   Numbness Active Problems:   Breast cancer metastasized to multiple sites [facility])   Essential hypertension   1. Numbness involving the whole body with history of metastatic breast cancerrecently received cranial radiation - ER physician had discussed with on-call radiation oncologist who has advised to get MRI of the entire spine with and without contrast and  increase patient's Decadron dose to IV. Patient is receiving Decadron every 6 hours, Continue Keppra. Mri spine showed diffuse metastatic disease to the spine, oncology Dr Marko Plume has discussed the finding with the patient and family , plan for palliative treatment with gemzar, initially plan to have port place while she is in the hospital, however, she was not able to put on the schedule, she is to come back to have port placed as outpatient, she is discharged on oral decadrone and continue follow with oncology. 2. Hypertension- patient is on lisinopril closely follow blood pressure trends. 3. Oral thrush: continue diflucan, topical nystatin   DVT prophylaxis:SCDs. Code Status:DNR Family Communication:Discussed with patient and husband at bedside Disposition Plan:Home. Consults called:  Oncology Dr Marko Plume ( will see patient this evening) Radiation oncology Dr [name]   Procedures:  none  Antibiotics:  none   Discharge Exam: BP (!) 128/93 (BP Location: Right Arm)   Pulse 88   Temp 98.1 F (36.7 C) (Oral)   Resp 18   Ht 5\' 5"  (1.651 m)   Wt 69 kg (152 lb 3.2 oz)   SpO2 99%   BMI 25.33 kg/m     General:  NAD  Cardiovascular: RRR  Respiratory: CTABL  Abdomen: Soft/ND/NT, positive BS  Musculoskeletal: No Edema  Neuro: aaox3   Discharge Instructions You were cared for by a hospitalist during your hospital stay. If you have any questions about your discharge medications or the care you received while you were in the hospital after you are discharged, you can call the unit and asked to speak with the hospitalist on call if the hospitalist that took care  of you is not available. Once you are discharged, your primary care physician will handle any further medical issues. Please note that NO REFILLS for any discharge medications will be authorized once you are discharged, as it is imperative that you return to your primary care physician (or establish a  relationship with a primary care physician if you do not have one) for your aftercare needs so that they can reassess your need for medications and monitor your lab values.  Discharge Instructions    Diet general    Complete by:  As directed    Increase activity slowly    Complete by:  As directed        Medication List    STOP taking these medications   FIRST-DUKES MOUTHWASH Susp   LORazepam 0.5 MG tablet Commonly known as:  ATIVAN     TAKE these medications   dexamethasone 4 MG tablet Commonly known as:  DECADRON Take one tablet po q 6 hours. Taper instructions to follow What changed:  how much to take  when to take this  additional instructions   fluconazole 100 MG tablet Commonly known as:  DIFLUCAN Take 1 tablet (100 mg total) by mouth daily.   levETIRAcetam 1000 MG tablet Commonly known as:  KEPPRA Take 1 tablet (1,000 mg total) by mouth 2 (two) times daily.   lidocaine-prilocaine cream Commonly known as:  EMLA Apply 1 application topically as needed. To port 1 hour before going to be accessed with needle. Cover with plastic wrap.   lisinopril 10 MG tablet Commonly known as:  PRINIVIL,ZESTRIL Take 1 tablet (10 mg total) by mouth daily.   nystatin 100000 UNIT/ML suspension Commonly known as:  MYCOSTATIN Take 5 mLs (500,000 Units total) by mouth 4 (four) times daily.   ondansetron 8 MG tablet Commonly known as:  ZOFRAN Take 1 tablet (8 mg total) by mouth every 8 (eight) hours as needed for nausea or vomiting.   Oxycodone HCl 10 MG Tabs Take 1 tablet by mouth every 3 (three) hours as needed for pain.   oxyCODONE 30 MG 12 hr tablet Commonly known as:  OXYCONTIN Take 30 mg by mouth 2 (two) times daily.   pantoprazole 40 MG tablet Commonly known as:  PROTONIX Take 1 tablet (40 mg total) by mouth 2 (two) times daily before a meal.   polyethylene glycol packet Commonly known as:  MIRALAX / GLYCOLAX Take 17 grams by mouth every day.      No Known  Allergies Follow-up [place] .   Why:  rolling walker Contact information: [address] [place] [address] [phone]            The results of significant diagnostics from this hospitalization (including imaging, microbiology, ancillary and laboratory) are listed below for reference.    Significant Diagnostic Studies: Ct Head Wo Contrast  Result Date: 04/15/2016 CLINICAL DATA:  Acute onset of generalized numbness and incontinence. Intermittent headache. Initial encounter. EXAM: CT HEAD WITHOUT CONTRAST TECHNIQUE: Contiguous axial images were obtained from the base of the skull through the vertex without intravenous contrast. COMPARISON:  MRI of the brain performed earlier today at 9:16 p.m. FINDINGS: Brain: No evidence of acute infarction, hemorrhage, hydrocephalus, extra-axial collection or mass lesion/mass effect. The posterior fossa, including the cerebellum, brainstem and fourth ventricle, is within normal limits. The third and lateral ventricles, and basal ganglia are unremarkable in appearance. The cerebral hemispheres are symmetric in appearance, with normal gray-white  differentiation. No mass effect or midline shift is seen. Vascular: No hyperdense vessel or unexpected calcification. Skull: There is no evidence of fracture; visualized osseous structures are unremarkable in appearance. Sinuses/Orbits: The visualized portions of the orbits are within normal limits. The paranasal sinuses and mastoid air cells are well-aerated. Other: No significant soft tissue abnormalities are seen. IMPRESSION: Unremarkable noncontrast CT of the head. Electronically Signed   By: Garald Balding M.D.   On: 04/15/2016 23:14   Mr Brain Wo Contrast  Result Date: 04/15/2016 CLINICAL DATA:  Numbness over the entire body. Incontinence beginning 3 days ago. Metastatic breast cancer. EXAM: MRI HEAD WITHOUT CONTRAST TECHNIQUE: Multiplanar, multiecho pulse  sequences of the brain and surrounding structures were obtained without intravenous contrast. COMPARISON:  None. FINDINGS: Diffusion imaging does not show any acute or subacute infarction. The brainstem and cerebellum are normal. Cerebral hemispheres are normal except for some low level FLAIR and T2 signal along the surface of the gyri in the left frontoparietal junction region. No restricted diffusion is noted in that area. There is probably some adjacent dural thickening. The patient is seen Tomi Bamberger osseous metastatic disease throughout the calvarium, the skullbase in the upper cervical spine. 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 The patient would not allow additional scanning and contrast-enhanced imaging is not performed. No hydrocephalus. No primarily intraparenchymal metastatic lesion is suspected. Minimal small-vessel changes affect the white matter. No pituitary mass. Major vessels at the base of the brain show flow. No inflammatory sinus disease. IMPRESSION: Osseous metastatic disease affecting the calvarium, skullbase and upper cervical spine. No evidence of parenchymal metastatic lesion. Dural thickening in the left frontoparietal region with some abnormal signal along the surface of the brain in the left frontoparietal region. 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 Unfortunately, the patient would not allow completion of the exam or other scans. Electronically Signed   By: Nelson Chimes M.D.   On: 04/15/2016 22:10   Mr Cervical Spine W Or Wo Contrast  Result Date: 04/16/2016 CLINICAL DATA:  Numbness involving the whole body. History of metastatic breast cancer. EXAM: MRI TOTAL SPINE WITHOUT AND WITH CONTRAST TECHNIQUE: Multisequence MR imaging of the spine from the cervical spine to the sacrum was performed prior to and following IV contrast administration for evaluation of spinal metastatic disease.  CONTRAST:  9mL MULTIHANCE GADOBENATE DIMEGLUMINE 529 MG/ML IV SOLN COMPARISON:  10/03/2014 thoracic spine MRI FINDINGS: MRI CERVICAL SPINE FINDINGS Alignment: Normal Vertebrae: Every cervical vertebra has infiltrating marrow lesion consistent with metastatic disease. Metastasis also seen in the inferior clivus. Thoracic spine disease reported below. There is a C3 superior endplate fracture that is chronic based on lack of marrow edema. There is no noted epidural or foraminal infiltration. Cord: Normal signal and morphology. Posterior Fossa, vertebral arteries, paraspinal tissues: Negative. Disc levels:  No notable degenerative change or impingement. MRI THORACIC SPINE FINDINGS Alignment:  Normal Vertebrae: Progressively confluent metastases throughout all the thoracic vertebrae. There are pathologic fractures involving the T3 superior endplate, T6 inferior endplate, T11 inferior endplate, and T12 inferior endplate. These have progressed from prior but show no marrow edema to suggest unhealed fracture. Tumor infiltration into the epidural space described previously is not confidently seen today. Cord:  Normal signal and morphology. Paraspinal and other soft tissues: Known liver metastases. Diffuse rib metastases. Disc levels: No degenerative impingement MRI LUMBAR SPINE FINDINGS Segmentation:  Standard. Alignment:  Physiologic. Vertebrae: Progressively confluent metastases at each level with L2, L3,  L5, and S1 superior endplate fractures that have a chronic appearance. No visible epidural or foraminal infiltration. Conus medullaris: Extends to the L1-2 disc level and appears normal. No abnormal cauda equina enhancement Paraspinal and other soft tissues: Sacral and ileal metastases. Disc levels: No degenerative stenosis or impingement. IMPRESSION: 1. Widespread osseous metastases, present at every spinal level, progressed from 2016 MRI. No noted epidural or foraminal infiltration. No evidence of intrathecal  metastasis. 2. Numerous chronic endplate fractures are described above. No acute fracture noted. Electronically Signed   By: Monte Fantasia M.D.   On: 04/16/2016 15:58   Mr Thoracic Spine W Wo Contrast  Result Date: 04/16/2016 CLINICAL DATA:  Numbness involving the whole body. History of metastatic breast cancer. EXAM: MRI TOTAL SPINE WITHOUT AND WITH CONTRAST TECHNIQUE: Multisequence MR imaging of the spine from the cervical spine to the sacrum was performed prior to and following IV contrast administration for evaluation of spinal metastatic disease. CONTRAST:  83mL MULTIHANCE GADOBENATE DIMEGLUMINE 529 MG/ML IV SOLN COMPARISON:  10/03/2014 thoracic spine MRI FINDINGS: MRI CERVICAL SPINE FINDINGS Alignment: Normal Vertebrae: Every cervical vertebra has infiltrating marrow lesion consistent with metastatic disease. Metastasis also seen in the inferior clivus. Thoracic spine disease reported below. There is a C3 superior endplate fracture that is chronic based on lack of marrow edema. There is no noted epidural or foraminal infiltration. Cord: Normal signal and morphology. Posterior Fossa, vertebral arteries, paraspinal tissues: Negative. Disc levels:  No notable degenerative change or impingement. MRI THORACIC SPINE FINDINGS Alignment:  Normal Vertebrae: Progressively confluent metastases throughout all the thoracic vertebrae. There are pathologic fractures involving the T3 superior endplate, T6 inferior endplate, T11 inferior endplate, and T12 inferior endplate. These have progressed from prior but show no marrow edema to suggest unhealed fracture. Tumor infiltration into the epidural space described previously is not confidently seen today. Cord:  Normal signal and morphology. Paraspinal and other soft tissues: Known liver metastases. Diffuse rib metastases. Disc levels: No degenerative impingement MRI LUMBAR SPINE FINDINGS Segmentation:  Standard. Alignment:  Physiologic. Vertebrae: Progressively confluent  metastases at each level with L2, L3, L5, and S1 superior endplate fractures that have a chronic appearance. No visible epidural or foraminal infiltration. Conus medullaris: Extends to the L1-2 disc level and appears normal. No abnormal cauda equina enhancement Paraspinal and other soft tissues: Sacral and ileal metastases. Disc levels: No degenerative stenosis or impingement. IMPRESSION: 1. Widespread osseous metastases, present at every spinal level, progressed from 2016 MRI. No noted epidural or foraminal infiltration. No evidence of intrathecal metastasis. 2. Numerous chronic endplate fractures are described above. No acute fracture noted. Electronically Signed   By: Monte Fantasia M.D.   On: 04/16/2016 15:58   Mr Lumbar Spine W Wo Contrast  Result Date: 04/16/2016 CLINICAL DATA:  Numbness involving the whole body. History of metastatic breast cancer. EXAM: MRI TOTAL SPINE WITHOUT AND WITH CONTRAST TECHNIQUE: Multisequence MR imaging of the spine from the cervical spine to the sacrum was performed prior to and following IV contrast administration for evaluation of spinal metastatic disease. CONTRAST:  53mL MULTIHANCE GADOBENATE DIMEGLUMINE 529 MG/ML IV SOLN COMPARISON:  10/03/2014 thoracic spine MRI FINDINGS: MRI CERVICAL SPINE FINDINGS Alignment: Normal Vertebrae: Every cervical vertebra has infiltrating marrow lesion consistent with metastatic disease. Metastasis also seen in the inferior clivus. Thoracic spine disease reported below. There is a C3 superior endplate fracture that is chronic based on lack of marrow edema. There is no noted epidural or foraminal infiltration. Cord: Normal signal and morphology. Posterior Fossa,  vertebral arteries, paraspinal tissues: Negative. Disc levels:  No notable degenerative change or impingement. MRI THORACIC SPINE FINDINGS Alignment:  Normal Vertebrae: Progressively confluent metastases throughout all the thoracic vertebrae. There are pathologic fractures involving  the T3 superior endplate, T6 inferior endplate, T11 inferior endplate, and T12 inferior endplate. These have progressed from prior but show no marrow edema to suggest unhealed fracture. Tumor infiltration into the epidural space described previously is not confidently seen today. Cord:  Normal signal and morphology. Paraspinal and other soft tissues: Known liver metastases. Diffuse rib metastases. Disc levels: No degenerative impingement MRI LUMBAR SPINE FINDINGS Segmentation:  Standard. Alignment:  Physiologic. Vertebrae: Progressively confluent metastases at each level with L2, L3, L5, and S1 superior endplate fractures that have a chronic appearance. No visible epidural or foraminal infiltration. Conus medullaris: Extends to the L1-2 disc level and appears normal. No abnormal cauda equina enhancement Paraspinal and other soft tissues: Sacral and ileal metastases. Disc levels: No degenerative stenosis or impingement. IMPRESSION: 1. Widespread osseous metastases, present at every spinal level, progressed from 2016 MRI. No noted epidural or foraminal infiltration. No evidence of intrathecal metastasis. 2. Numerous chronic endplate fractures are described above. No acute fracture noted. Electronically Signed   By: Monte Fantasia M.D.   On: 04/16/2016 15:58    Microbiology: No results found for this or any previous visit (from the past 240 hour(s)).   Labs: Basic Metabolic Panel:  Recent Labs Lab 04/15/16 2109  NA 136  K 4.4  CL 99*  CO2 29  GLUCOSE 120*  BUN 19  CREATININE 0.78  CALCIUM 9.0   Liver Function Tests:  Recent Labs Lab 04/15/16 2109  AST 48*  ALT 96*  ALKPHOS 89  BILITOT 0.7  PROT 6.4*  ALBUMIN 3.4*    Recent Labs Lab 04/15/16 2109  LIPASE 19   No results for input(s): AMMONIA in the last 168 hours. CBC:  Recent Labs Lab 04/15/16 2109  WBC 5.4  NEUTROABS 4.5  HGB 14.6  HCT 44.1  MCV 104.3*  PLT 139*   Cardiac Enzymes: No results for input(s): CKTOTAL,  CKMB, CKMBINDEX, TROPONINI in the last 168 hours. BNP: BNP (last 3 results) No results for input(s): BNP in the last 8760 hours.  ProBNP (last 3 results) No results for input(s): PROBNP in the last 8760 hours.  CBG: No results for input(s): GLUCAP in the last 168 hours.     SignedFlorencia Reasons MD, PhD  [facility] 04/18/2016, 1:36 PM

## 2016-04-20 ENCOUNTER — Encounter: Payer: Self-pay | Admitting: General Practice

## 2016-04-20 ENCOUNTER — Telehealth: Payer: Self-pay | Admitting: *Deleted

## 2016-04-20 NOTE — Progress Notes (Signed)
[facility] Spiritual Care Note  Received referral from Dr Marko Plume for extra support, given Ms Speth's extensive mets and prognosis.  Reached her by phone to introduce Spiritual Care and Support Team availability.  She thanked me, but noted that she has no need for additional support at this time because her family and friends are talking with her and taking care of her.  Encouraged her (or a caregiver) to reach out anytime for another layer of support if desired.   [place], [place], [facility] Pager [phone] Voicemail [phone]

## 2016-04-20 NOTE — Telephone Encounter (Signed)
-----   Message from Gordy Levan, MD sent at 04/17/2016 12:09 PM EDT ----- Cannot get PAC done in hospital today ([facility] 1407) Expect DC later today.  PLEASE SEE IF ANY SPOTS FOR PAC PRIOR TO 10-25 Will need first gemzar next week after PAC placed.    Thank you

## 2016-04-20 NOTE — Telephone Encounter (Signed)
Left message for husband to call regarding appt on Friday for chemo. Lab at 1130, gemzar at 1215.

## 2016-04-21 ENCOUNTER — Ambulatory Visit: Payer: BLUE CROSS/BLUE SHIELD

## 2016-04-21 ENCOUNTER — Encounter: Payer: Self-pay | Admitting: Oncology

## 2016-04-21 ENCOUNTER — Other Ambulatory Visit: Payer: Self-pay | Admitting: *Deleted

## 2016-04-21 ENCOUNTER — Other Ambulatory Visit: Payer: Self-pay | Admitting: Radiology

## 2016-04-21 MED ORDER — ONDANSETRON HCL 8 MG PO TABS: 8.0000 mg | ORAL_TABLET | Freq: Three times a day (TID) | ORAL | 0 refills | Status: DC | PRN

## 2016-04-21 MED ORDER — LORAZEPAM 0.5 MG PO TABS: ORAL_TABLET | ORAL | 0 refills | Status: DC

## 2016-04-21 NOTE — Progress Notes (Signed)
Medical Oncology   Message to schedulers high priority now 'LL with lab from [facility] on 10-27 prior to chemo 11-27. NOTE that day being used "per MD only" Cancel lab flush Rx 10-30 She should keep LL apt + lab 11-2"  Message to RN to confirm with patient  L.Marko Plume, MD

## 2016-04-21 NOTE — Telephone Encounter (Signed)
Spoke with husband. Will be able to come for chemo on 10/27. Needs refills of zofran and ativan- completed.

## 2016-04-22 ENCOUNTER — Encounter (HOSPITAL_COMMUNITY): Payer: Self-pay

## 2016-04-22 ENCOUNTER — Other Ambulatory Visit: Payer: Self-pay | Admitting: Oncology

## 2016-04-22 ENCOUNTER — Ambulatory Visit (HOSPITAL_COMMUNITY)
Admission: RE | Admit: 2016-04-22 | Discharge: 2016-04-22 | Disposition: A | Discharge status: Home / Self Care | Payer: BLUE CROSS/BLUE SHIELD | Source: Ambulatory Visit | Attending: Oncology | Admitting: Oncology

## 2016-04-22 DIAGNOSIS — C50919 Malignant neoplasm of unspecified site of unspecified female breast: Secondary | ICD-10-CM

## 2016-04-22 DIAGNOSIS — Z923 Personal history of irradiation: Secondary | ICD-10-CM | POA: Insufficient documentation

## 2016-04-22 DIAGNOSIS — Z9221 Personal history of antineoplastic chemotherapy: Secondary | ICD-10-CM | POA: Diagnosis not present

## 2016-04-22 DIAGNOSIS — C50912 Malignant neoplasm of unspecified site of left female breast: Secondary | ICD-10-CM

## 2016-04-22 DIAGNOSIS — F419 Anxiety disorder, unspecified: Secondary | ICD-10-CM | POA: Diagnosis not present

## 2016-04-22 DIAGNOSIS — K219 Gastro-esophageal reflux disease without esophagitis: Secondary | ICD-10-CM | POA: Insufficient documentation

## 2016-04-22 DIAGNOSIS — C7951 Secondary malignant neoplasm of bone: Principal | ICD-10-CM

## 2016-04-22 DIAGNOSIS — Z170 Estrogen receptor positive status [ER+]: Secondary | ICD-10-CM | POA: Insufficient documentation

## 2016-04-22 HISTORY — PX: IR GENERIC HISTORICAL: IMG1180011

## 2016-04-22 LAB — CBC WITH DIFFERENTIAL/PLATELET
BASOS PCT: 0 %
Basophils Absolute: 0 10*3/uL (ref 0.0–0.1)
EOS PCT: 0 %
Eosinophils Absolute: 0 10*3/uL (ref 0.0–0.7)
HEMATOCRIT: 43.9 % (ref 36.0–46.0)
HEMOGLOBIN: 15.1 g/dL — AB (ref 12.0–15.0)
LYMPHS PCT: 8 %
Lymphs Abs: 0.7 10*3/uL (ref 0.7–4.0)
MCH: 35 pg — ABNORMAL HIGH (ref 26.0–34.0)
MCHC: 34.4 g/dL (ref 30.0–36.0)
MCV: 101.6 fL — ABNORMAL HIGH (ref 78.0–100.0)
MONO ABS: 0.7 10*3/uL (ref 0.1–1.0)
MONOS PCT: 8 %
NEUTROS PCT: 84 %
Neutro Abs: 6.8 10*3/uL (ref 1.7–7.7)
Platelets: 180 10*3/uL (ref 150–400)
RBC: 4.32 MIL/uL (ref 3.87–5.11)
RDW: 14.1 % (ref 11.5–15.5)
WBC: 8.2 10*3/uL (ref 4.0–10.5)

## 2016-04-22 LAB — APTT: aPTT: 20 seconds — ABNORMAL LOW (ref 24–36)

## 2016-04-22 LAB — PROTIME-INR
INR: 0.91
Prothrombin Time: 12.2 seconds (ref 11.4–15.2)

## 2016-04-22 MED ORDER — HEPARIN SOD (PORK) LOCK FLUSH 100 UNIT/ML IV SOLN
INTRAVENOUS | Status: AC | PRN
  Administered 2016-04-22: 500 [IU]

## 2016-04-22 MED ORDER — LIDOCAINE HCL 1 % IJ SOLN
INTRAMUSCULAR | Status: AC | PRN
  Administered 2016-04-22: 10 mL via INTRADERMAL

## 2016-04-22 MED ORDER — FENTANYL CITRATE (PF) 100 MCG/2ML IJ SOLN
INTRAMUSCULAR | Status: AC
  Filled 2016-04-22: qty 4

## 2016-04-22 MED ORDER — FENTANYL CITRATE (PF) 100 MCG/2ML IJ SOLN
INTRAMUSCULAR | Status: AC | PRN
  Administered 2016-04-22 (×2): 25 ug via INTRAVENOUS
  Administered 2016-04-22: 50 ug via INTRAVENOUS

## 2016-04-22 MED ORDER — HEPARIN SOD (PORK) LOCK FLUSH 100 UNIT/ML IV SOLN
INTRAVENOUS | Status: AC
  Filled 2016-04-22: qty 5

## 2016-04-22 MED ORDER — MIDAZOLAM HCL 2 MG/2ML IJ SOLN
INTRAMUSCULAR | Status: AC | PRN
  Administered 2016-04-22: 2 mg via INTRAVENOUS
  Administered 2016-04-22 (×3): 1 mg via INTRAVENOUS

## 2016-04-22 MED ORDER — MIDAZOLAM HCL 2 MG/2ML IJ SOLN
INTRAMUSCULAR | Status: AC
  Filled 2016-04-22: qty 6

## 2016-04-22 MED ORDER — LIDOCAINE HCL 1 % IJ SOLN
INTRAMUSCULAR | Status: AC
  Filled 2016-04-22: qty 20

## 2016-04-22 MED ORDER — CEFAZOLIN SODIUM-DEXTROSE 2-4 GM/100ML-% IV SOLN
2.0000 g | INTRAVENOUS | Status: AC
  Administered 2016-04-22: 2 g via INTRAVENOUS
  Filled 2016-04-22: qty 100

## 2016-04-22 NOTE — H&P (Signed)
Referring Physician(s): Livesay,Jill P  Supervising Physician: Markus Daft  Patient Status:  [facility]  Chief Complaint:  "I am here for port a cath placement."  Subjective:  Patient has past medical history significant for metastatic breast cancer, s/p radical left mastectomy, radiation and chemo, prior right porta a cath placed at [facility] removed 7 years ago, who presents today for port a cath placement for chemotherapy. Patient familiar to IR from Bone lesion Bx in 2016.    Denies chest pain, shortness of breath, abnormal bleeding, fever/chills, recent illness.  Past Medical History:  Diagnosis Date  . Anxiety   . 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 Breast cancer metastasized to multiple sites ([place]) 04/12/2016  . GERD (gastroesophageal reflux disease)    Past Surgical History:  Procedure Laterality Date  . CHOLECYSTECTOMY    . ESOPHAGOGASTRODUODENOSCOPY (EGD) WITH PROPOFOL N/A 07/12/2015   Procedure: ESOPHAGOGASTRODUODENOSCOPY (EGD) WITH PROPOFOL;  Surgeon: Rogene Houston, MD;  Location: [facility];  Service: Endoscopy;  Laterality: N/A;  1:00  . MASTECTOMY  April 2009   Left breast with lymph node resection   Allergies: Review of patient's allergies indicates no known allergies.  Medications: Prior to Admission medications   Medication Sig Start Date End Date Taking? Authorizing Provider  dexamethasone (DECADRON) 4 MG tablet Take one tablet po q 6 hours. Taper instructions to follow 04/17/16  Yes Jill Reasons, MD  fluconazole (DIFLUCAN) 100 MG tablet Take 1 tablet (100 mg total) by mouth daily. 04/17/16  Yes Jill Reasons, MD  levETIRAcetam (KEPPRA) 1000 MG tablet Take 1 tablet (1,000 mg total) by mouth 2 (two) times daily. 03/23/16  Yes Hayden Pedro, PA-C  lisinopril (PRINIVIL,ZESTRIL) 10 MG tablet Take 1 tablet (10 mg total) by mouth daily. 01/23/16  Yes Jill Marion Downer, MD    oxyCODONE (OXYCONTIN) 30 MG 12 hr tablet Take 30 mg by mouth 2 (two) times daily. 04/10/16  Yes Jill Marion Downer, MD  pantoprazole (PROTONIX) 40 MG tablet Take 1 tablet (40 mg total) by mouth 2 (two) times daily before a meal. 01/06/16  Yes Butch Penny, NP  lidocaine-prilocaine (EMLA) cream Apply 1 application topically as needed. To port 1 hour before going to be accessed with needle. Cover with plastic wrap. 04/17/16   Jill Marion Downer, MD  LORazepam (ATIVAN) 0.5 MG tablet 1 tablet every 6 hours as needed for nausea. Will make drowsy 04/21/16   Jill Marion Downer, MD  nystatin (MYCOSTATIN) 100000 UNIT/ML suspension Take 5 mLs (500,000 Units total) by mouth 4 (four) times daily. 04/17/16   Jill Reasons, MD  ondansetron (ZOFRAN) 8 MG tablet Take 1 tablet (8 mg total) by mouth every 8 (eight) hours as needed for nausea or vomiting. 04/21/16   Jill Marion Downer, MD  Oxycodone HCl 10 MG TABS Take 1 tablet by mouth every 3 (three) hours as needed for pain. 11/29/15   Historical Provider, MD  polyethylene glycol (MIRALAX / GLYCOLAX) packet Take 17 grams by mouth every day. 10/11/14   Historical Provider, MD     Vital Signs: BP (!) 139/96   Pulse 97   Temp 97.8 F (36.6 C) (Oral)   Resp 16   Ht 5\' 5"  (1.651 m)   Wt 154 lb (69.9 kg)   SpO2 98%   BMI 25.63 kg/m   Physical Exam 50 yo WNWD female, resting comfortably in bed, in no  acute distress, AAOx3 Resp: clear to auscultation bilaterally, normal respiratory effort CV: RRR, no murmur rubs or gallops Abd: non tender, non-distended, +BS  Imaging: No results found.  Labs:  CBC:  Recent Labs  02/27/16 1037 03/19/16 0945 04/10/16 0915 04/15/16 2109  WBC 1.9* 2.0* 7.1 5.4  HGB 12.2 12.0 15.0 14.6  HCT 36.8 36.6 45.7 44.1  PLT 208 242 194 139*    COAGS: No results for input(s): INR, APTT in the last 8760 hours.  BMP:  Recent Labs  02/27/16 1037 03/19/16 0945 04/10/16 0916 04/15/16 2109  NA 139 139 132* 136  K 4.4 4.5 4.7 4.4   CL  --   --   --  99*  CO2 27 26 27 29   GLUCOSE 107 111 91 120*  BUN 13.7 15.5 21.4 19  CALCIUM 9.5 9.3 9.3 9.0  CREATININE 1.0 1.0 0.8 0.78  GFRNONAA  --   --   --  >60  GFRAA  --   --   --  >60    LIVER FUNCTION TESTS:  Recent Labs  02/27/16 1037 03/19/16 0945 04/10/16 0916 04/15/16 2109  BILITOT 0.52 0.45 0.58 0.7  AST 41* 55* 41* 48*  ALT 53 57* 88* 96*  ALKPHOS 104 117 108 89  PROT 7.2 7.1 6.8 6.4*  ALBUMIN 3.4* 3.3* 3.2* 3.4*    Assessment and Plan:  Patient has past medical history significant for metastatic breast cancer, s/p radical left mastectomy, radiation and chemo, prior right porta a cath placed at [facility] removed 7 years ago, who presents today for port a cath placement for chemotherapy due to evidence of disease progression.  Patient familiar to IR from Bone lesion Bx in 2016.   Risks and benefits discussed with the patient/husband including, but not limited to bleeding, infection, pneumothorax, or fibrin sheath development and need for additional procedures.  All of the patient's questions were answered, patient is agreeable to proceed.  Consent signed and in chart.   Electronically Signed: D. Rowe Robert 04/22/2016, 1:15 PM   I spent a total of 25 Minutes at the the patient's bedside AND on the patient's hospital floor or unit, greater than 50% of which was counseling/coordinating care for port a cath placement for chemotherapy.

## 2016-04-22 NOTE — Procedures (Signed)
Placement of right jugular portacath.  Tip at SVC/RA junction.  Minimal blood loss and no immediate complication.  See full report in PACS.

## 2016-04-22 NOTE — Discharge Instructions (Signed)
Implanted Port Home Guide °An implanted port is a type of central line that is placed under the skin. Central lines are used to provide IV access when treatment or nutrition needs to be given through a person's veins. Implanted ports are used for long-term IV access. An implanted port may be placed because:  °· You need IV medicine that would be irritating to the small veins in your hands or arms.   °· You need long-term IV medicines, such as antibiotics.   °· You need IV nutrition for a long period.   °· You need frequent blood draws for lab tests.   °· You need dialysis.   °Implanted ports are usually placed in the chest area, but they can also be placed in the upper arm, the abdomen, or the leg. An implanted port has two main parts:  °· Reservoir. The reservoir is round and will appear as a small, raised area under your skin. The reservoir is the part where a needle is inserted to give medicines or draw blood.   °· Catheter. The catheter is a thin, flexible tube that extends from the reservoir. The catheter is placed into a large vein. Medicine that is inserted into the reservoir goes into the catheter and then into the vein.   °HOW WILL I CARE FOR MY INCISION SITE? °Do not get the incision site wet. Bathe or shower as directed by your health care provider.  °HOW IS MY PORT ACCESSED? °Special steps must be taken to access the port:  °· Before the port is accessed, a numbing cream can be placed on the skin. This helps numb the skin over the port site.   °· Your health care provider uses a sterile technique to access the port. °· Your health care provider must put on a mask and sterile gloves. °· The skin over your port is cleaned carefully with an antiseptic and allowed to dry. °· The port is gently pinched between sterile gloves, and a needle is inserted into the port. °· Only "non-coring" port needles should be used to access the port. Once the port is accessed, a blood return should be checked. This helps  ensure that the port is in the vein and is not clogged.   °· If your port needs to remain accessed for a constant infusion, a clear (transparent) bandage will be placed over the needle site. The bandage and needle will need to be changed every week, or as directed by your health care provider.   °· Keep the bandage covering the needle clean and dry. Do not get it wet. Follow your health care provider's instructions on how to take a shower or bath while the port is accessed.   °· If your port does not need to stay accessed, no bandage is needed over the port.   °WHAT IS FLUSHING? °Flushing helps keep the port from getting clogged. Follow your health care provider's instructions on how and when to flush the port. Ports are usually flushed with saline solution or a medicine called heparin. The need for flushing will depend on how the port is used.  °· If the port is used for intermittent medicines or blood draws, the port will need to be flushed:   °· After medicines have been given.   °· After blood has been drawn.   °· As part of routine maintenance.   °· If a constant infusion is running, the port may not need to be flushed.   °HOW LONG WILL MY PORT STAY IMPLANTED? °The port can stay in for as long as your health care   provider thinks it is needed. When it is time for the port to come out, surgery will be done to remove it. The procedure is similar to the one performed when the port was put in.  °WHEN SHOULD I SEEK IMMEDIATE MEDICAL CARE? °When you have an implanted port, you should seek immediate medical care if:  °· You notice a bad smell coming from the incision site.   °· You have swelling, redness, or drainage at the incision site.   °· You have more swelling or pain at the port site or the surrounding area.   °· You have a fever that is not controlled with medicine. °  °This information is not intended to replace advice given to you by your health care provider. Make sure you discuss any questions you have with  your health care provider. °  °Document Released: 06/15/2005 Document Revised: 04/05/2013 Document Reviewed: 02/20/2013 °Elsevier Interactive Patient Education ©2016 [employer] °Implanted Port Insertion, Care After °Refer to this sheet in the next few weeks. These instructions provide you with information on caring for yourself after your procedure. Your health care provider may also give you more specific instructions. Your treatment has been planned according to current medical practices, but problems sometimes occur. Call your health care provider if you have any problems or questions after your procedure. °WHAT TO EXPECT AFTER THE PROCEDURE °After your procedure, it is typical to have the following:  °· Discomfort at the port insertion site. Ice packs to the area will help. °· Bruising on the skin over the port. This will subside in 3-4 days. °HOME CARE INSTRUCTIONS °· After your port is placed, you will get a manufacturer's information card. The card has information about your port. Keep this card with you at all times.   °· Know what kind of port you have. There are many types of ports available.   °· Wear a medical alert bracelet in case of an emergency. This can help alert health care workers that you have a port.   °· The port can stay in for as long as your health care provider believes it is necessary.   °· A home health care nurse may give medicines and take care of the port.   °· You or a family member can get special training and directions for giving medicine and taking care of the port at home.   °SEEK MEDICAL CARE IF:  °· Your port does not flush or you are unable to get a blood return.   °· You have a fever or chills. °SEEK IMMEDIATE MEDICAL CARE IF: °· You have new fluid or pus coming from your incision.   °· You notice a bad smell coming from your incision site.   °· You have swelling, pain, or more redness at the incision or port site.   °· You have chest pain or shortness of breath. °  °This  information is not intended to replace advice given to you by your health care provider. Make sure you discuss any questions you have with your health care provider. °  °Document Released: 04/05/2013 Document Revised: 06/20/2013 Document Reviewed: 04/05/2013 °Elsevier Interactive Patient Education ©2016 [employer] °Moderate Conscious Sedation, Adult, Care After °Refer to this sheet in the next few weeks. These instructions provide you with information on caring for yourself after your procedure. Your health care provider may also give you more specific instructions. Your treatment has been planned according to current medical practices, but problems sometimes occur. Call your health care provider if you have any problems or questions after your   procedure. °WHAT TO EXPECT AFTER THE PROCEDURE  °After your procedure: °· You may feel sleepy, clumsy, and have poor balance for several hours. °· Vomiting may occur if you eat too soon after the procedure. °HOME CARE INSTRUCTIONS °· Do not participate in any activities where you could become injured for at least 24 hours. Do not: °¨ Drive. °¨ Swim. °¨ Ride a bicycle. °¨ Operate heavy machinery. °¨ Cook. °¨ Use power tools. °¨ Climb ladders. °¨ Work from a high place. °· Do not make important decisions or sign legal documents until you are improved. °· If you vomit, drink water, juice, or soup when you can drink without vomiting. Make sure you have little or no nausea before eating solid foods. °· Only take over-the-counter or prescription medicines for pain, discomfort, or fever as directed by your health care provider. °· Make sure you and your family fully understand everything about the medicines given to you, including what side effects may occur. °· You should not drink alcohol, take sleeping pills, or take medicines that cause drowsiness for at least 24 hours. °· If you smoke, do not smoke without supervision. °· If you are feeling better, you may resume normal  activities 24 hours after you were sedated. °· Keep all appointments with your health care provider. °SEEK MEDICAL CARE IF: °· Your skin is pale or bluish in color. °· You continue to feel nauseous or vomit. °· Your pain is getting worse and is not helped by medicine. °· You have bleeding or swelling. °· You are still sleepy or feeling clumsy after 24 hours. °SEEK IMMEDIATE MEDICAL CARE IF: °· You develop a rash. °· You have difficulty breathing. °· You develop any type of allergic problem. °· You have a fever. °MAKE SURE YOU: °· Understand these instructions. °· Will watch your condition. °· Will get help right away if you are not doing well or get worse. °  °This information is not intended to replace advice given to you by your health care provider. Make sure you discuss any questions you have with your health care provider. °  °Document Released: 04/05/2013 Document Revised: 07/06/2014 Document Reviewed: 04/05/2013 °Elsevier Interactive Patient Education ©2016 [employer] ° °

## 2016-04-23 ENCOUNTER — Other Ambulatory Visit: Payer: BLUE CROSS/BLUE SHIELD

## 2016-04-23 ENCOUNTER — Ambulatory Visit (HOSPITAL_BASED_OUTPATIENT_CLINIC_OR_DEPARTMENT_OTHER): Payer: BLUE CROSS/BLUE SHIELD | Admitting: Genetic Counselor

## 2016-04-23 DIAGNOSIS — Z809 Family history of malignant neoplasm, unspecified: Secondary | ICD-10-CM

## 2016-04-23 DIAGNOSIS — C50919 Malignant neoplasm of unspecified site of unspecified female breast: Secondary | ICD-10-CM | POA: Diagnosis not present

## 2016-04-23 DIAGNOSIS — Z800 Family history of malignant neoplasm of digestive organs: Secondary | ICD-10-CM

## 2016-04-23 DIAGNOSIS — Z315 Encounter for genetic counseling: Secondary | ICD-10-CM

## 2016-04-24 ENCOUNTER — Ambulatory Visit (HOSPITAL_BASED_OUTPATIENT_CLINIC_OR_DEPARTMENT_OTHER): Payer: BLUE CROSS/BLUE SHIELD | Admitting: Oncology

## 2016-04-24 ENCOUNTER — Encounter: Payer: Self-pay | Admitting: Oncology

## 2016-04-24 ENCOUNTER — Ambulatory Visit: Payer: BLUE CROSS/BLUE SHIELD

## 2016-04-24 ENCOUNTER — Encounter: Payer: Self-pay | Admitting: Genetic Counselor

## 2016-04-24 ENCOUNTER — Other Ambulatory Visit (HOSPITAL_BASED_OUTPATIENT_CLINIC_OR_DEPARTMENT_OTHER): Payer: BLUE CROSS/BLUE SHIELD

## 2016-04-24 ENCOUNTER — Ambulatory Visit (HOSPITAL_BASED_OUTPATIENT_CLINIC_OR_DEPARTMENT_OTHER): Payer: BLUE CROSS/BLUE SHIELD

## 2016-04-24 VITALS — BP 124/96 | HR 104 | Temp 98.0°F | Resp 18 | Wt 155.0 lb

## 2016-04-24 VITALS — HR 108

## 2016-04-24 DIAGNOSIS — C7800 Secondary malignant neoplasm of unspecified lung: Secondary | ICD-10-CM

## 2016-04-24 DIAGNOSIS — Z95828 Presence of other vascular implants and grafts: Secondary | ICD-10-CM | POA: Insufficient documentation

## 2016-04-24 DIAGNOSIS — B370 Candidal stomatitis: Secondary | ICD-10-CM | POA: Insufficient documentation

## 2016-04-24 DIAGNOSIS — C7931 Secondary malignant neoplasm of brain: Secondary | ICD-10-CM

## 2016-04-24 DIAGNOSIS — C7951 Secondary malignant neoplasm of bone: Secondary | ICD-10-CM

## 2016-04-24 DIAGNOSIS — C50212 Malignant neoplasm of upper-inner quadrant of left female breast: Secondary | ICD-10-CM

## 2016-04-24 DIAGNOSIS — C50912 Malignant neoplasm of unspecified site of left female breast: Secondary | ICD-10-CM

## 2016-04-24 DIAGNOSIS — C787 Secondary malignant neoplasm of liver and intrahepatic bile duct: Secondary | ICD-10-CM

## 2016-04-24 DIAGNOSIS — Z5111 Encounter for antineoplastic chemotherapy: Secondary | ICD-10-CM

## 2016-04-24 DIAGNOSIS — C50919 Malignant neoplasm of unspecified site of unspecified female breast: Secondary | ICD-10-CM

## 2016-04-24 DIAGNOSIS — G893 Neoplasm related pain (acute) (chronic): Secondary | ICD-10-CM

## 2016-04-24 DIAGNOSIS — K219 Gastro-esophageal reflux disease without esophagitis: Secondary | ICD-10-CM

## 2016-04-24 DIAGNOSIS — I890 Lymphedema, not elsewhere classified: Secondary | ICD-10-CM

## 2016-04-24 DIAGNOSIS — C7949 Secondary malignant neoplasm of other parts of nervous system: Secondary | ICD-10-CM

## 2016-04-24 DIAGNOSIS — C7802 Secondary malignant neoplasm of left lung: Secondary | ICD-10-CM

## 2016-04-24 LAB — COMPREHENSIVE METABOLIC PANEL
ALK PHOS: 117 U/L (ref 40–150)
ALT: 83 U/L — ABNORMAL HIGH (ref 0–55)
ANION GAP: 9 meq/L (ref 3–11)
AST: 40 U/L — ABNORMAL HIGH (ref 5–34)
Albumin: 3.2 g/dL — ABNORMAL LOW (ref 3.5–5.0)
BUN: 26.3 mg/dL — ABNORMAL HIGH (ref 7.0–26.0)
CALCIUM: 8.7 mg/dL (ref 8.4–10.4)
CHLORIDE: 99 meq/L (ref 98–109)
CO2: 25 mEq/L (ref 22–29)
Creatinine: 0.7 mg/dL (ref 0.6–1.1)
EGFR: >90 mL/min/{1.73_m2} (ref >90)
Glucose: 95 mg/dl (ref 70–140)
POTASSIUM: 4.6 meq/L (ref 3.5–5.1)
Sodium: 132 mEq/L — ABNORMAL LOW (ref 136–145)
Total Bilirubin: 0.52 mg/dL (ref 0.20–1.20)
Total Protein: 6.6 g/dL (ref 6.4–8.3)

## 2016-04-24 LAB — CBC WITH DIFFERENTIAL/PLATELET
BASO%: 0.1 % (ref 0.0–2.0)
BASOS ABS: 0 10*3/uL (ref 0.0–0.1)
EOS ABS: 0 10*3/uL (ref 0.0–0.5)
EOS%: 0 % (ref 0.0–7.0)
HEMATOCRIT: 43.7 % (ref 34.8–46.6)
HGB: 14.8 g/dL (ref 11.6–15.9)
LYMPH%: 8.2 % — AB (ref 14.0–49.7)
MCH: 34.5 pg — AB (ref 25.1–34.0)
MCHC: 33.9 g/dL (ref 31.5–36.0)
MCV: 101.9 fL — AB (ref 79.5–101.0)
MONO#: 0.7 10*3/uL (ref 0.1–0.9)
MONO%: 8.6 % (ref 0.0–14.0)
NEUT#: 6.4 10*3/uL (ref 1.5–6.5)
NEUT%: 83.1 % — AB (ref 38.4–76.8)
PLATELETS: 149 10*3/uL (ref 145–400)
RBC: 4.29 10*6/uL (ref 3.70–5.45)
RDW: 14.2 % (ref 11.2–14.5)
WBC: 7.7 10*3/uL (ref 3.9–10.3)
lymph#: 0.6 10*3/uL — ABNORMAL LOW (ref 0.9–3.3)
nRBC: 0 % (ref 0–0)

## 2016-04-24 LAB — TECHNOLOGIST REVIEW

## 2016-04-24 MED ORDER — PROCHLORPERAZINE MALEATE 10 MG PO TABS
ORAL_TABLET | ORAL | Status: AC
  Filled 2016-04-24: qty 1

## 2016-04-24 MED ORDER — HEPARIN SOD (PORK) LOCK FLUSH 100 UNIT/ML IV SOLN
500.0000 [IU] | Freq: Once | INTRAVENOUS | Status: AC | PRN
  Administered 2016-04-24: 500 [IU]
  Filled 2016-04-24: qty 5

## 2016-04-24 MED ORDER — SODIUM CHLORIDE 0.9 % IV SOLN
800.0000 mg/m2 | Freq: Once | INTRAVENOUS | Status: AC
  Administered 2016-04-24: 1444 mg via INTRAVENOUS
  Filled 2016-04-24: qty 37.98

## 2016-04-24 MED ORDER — CHLORHEXIDINE GLUCONATE 0.12 % MT SOLN: 15.0000 mL | Freq: Three times a day (TID) | OROMUCOSAL | 0 refills | Status: DC

## 2016-04-24 MED ORDER — DENOSUMAB 120 MG/1.7ML ~~LOC~~ SOLN
120.0000 mg | Freq: Once | SUBCUTANEOUS | Status: DC
  Filled 2016-04-24: qty 1.7

## 2016-04-24 MED ORDER — SODIUM CHLORIDE 0.9 % IV SOLN
Freq: Once | INTRAVENOUS | Status: AC
Start: 2016-04-24 — End: 2016-04-24
  Administered 2016-04-24: 13:00:00 via INTRAVENOUS

## 2016-04-24 MED ORDER — PROCHLORPERAZINE MALEATE 10 MG PO TABS
10.0000 mg | ORAL_TABLET | Freq: Once | ORAL | Status: AC
  Administered 2016-04-24: 10 mg via ORAL

## 2016-04-24 MED ORDER — SODIUM CHLORIDE 0.9% FLUSH
10.0000 mL | INTRAVENOUS | Status: DC | PRN
  Administered 2016-04-24: 10 mL
  Filled 2016-04-24: qty 10

## 2016-04-24 MED ORDER — ACETAMINOPHEN 325 MG PO TABS
325.0000 mg | ORAL_TABLET | Freq: Once | ORAL | Status: AC
  Administered 2016-04-24: 325 mg via ORAL

## 2016-04-24 MED ORDER — ACETAMINOPHEN 325 MG PO TABS
ORAL_TABLET | ORAL | Status: AC
  Filled 2016-04-24: qty 1

## 2016-04-24 NOTE — Progress Notes (Signed)
OFFICE PROGRESS NOTE   April 24, 2016   Physicians: Jacquiline Doe, [name] Plump, [place], [place] Muss, Ardean Larsen) , gyn in [place]  INTERVAL HISTORY:   Patient is seen, alone for visit, in continuing attention to extensively metastatic left breast cancer, now with leptomeningeal/ parietal lobe, lung, liver and extensive bone involvement. She had whole brain radiation by Dr [name] 9-25 thru 04-10-16. She was hospitalized 10-18 thru 04-18-16 with neurologic symptoms related to noncompliance with steroids. She had pamidronate in hospital 04-17-16 and is for first gemzar today in palliative attempt.  Patient reports no acute problems since hospital discharge, including no reoccurrence of the diffuse sensory changes after increased decadron doses. She continues decadron 4 mg q 6 hrs, which she feels she is unable to tolerate; patient reports better tolerance when she extends decadron interval to q 8 hrs.  Appetite is excellent on steroids, eating mostly protein as meat, vegetables, fruit. On present pain medication only discomfort is left lateral ribs and right lower ribs in midclavicular line. She tells me that her mentation is good, "my English is better than before", no weakness UE or LE, balance ok, no sensory problems now. She denies SOB or cough. Mouth and throat are less sore, still tongue coated, taste better. Face puffy with steroids. Has lost most of hair since radiation. Vision a little difficult for reading (may be with steroids). Is able to sleep. Bowels are moving several times daily, not watery or liquid. No fever on steroids. No LE swelling. No bleeding.  PAC not uncomfortable now, good blood return for labs today. Remainder of 10 point Review of Systems negative.   PAC placed by IR 04-22-16 Genetics counseling 10-26, labs sent 04-24-16 Appointment with [facility] upcoming per patient  Son is 65. She is working with him on his [place],  has talked with him about genetics  testing pending and has encouraged him to make healthy lifestyle choices.   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 The breast cancer was ER + 100%, PR 1%, HER 2 IHC/FISH negative, Ki67 52%, BRCA 1/2 negative, initial CA 2729 WNL. She had neoadjuvant AC x4 taxol x12 dose dense from 06-10-2007 thru 09-28-2007 by Dr B.Darovsky. 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 She had radiation to chest wall, supraclavicular region and left axilla 50.4 Gy with boost to mastectomy scar to 60.4 Gy, by Dr [name] from 12-15-2007 thru 01-31-2008. She was on tamoxifen from 02-20-2008 thru 09-25-2014. She developed pain in ribs 08-2014, with CA 2729 52 and PET CT and MRI with diffuse bone metastatic disease, no cord compression, small lung lesions and intrathoracic adenopathy. 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 She began Denosumab 120 mcg monthly starting 4-7-201, and letrozole + palbociclib beginning4-12-2014. She was seen in consultation by Dr [name] [phone]. Palbociclib dose decreased to 100 mg daily x 21 q 28 days due to neutropenia. CBC on 10-25-15: WBC 2.1, Hgb 13, plt 186, ANC 0.9  PET 09-25-2014 in [facility] and  CT CAP at [facility] 10-25-15 with no clear pulmonary involvement, stable 2 cm lesion at dome of liver and 11 mm left hepatic lobe lesion. She had right tomo mammogram at [facility] 09-17-15,  with heterogeneously dense breast tissue but no other mammographic findings of concern.  CA 2729 increased from 38 in 08-2015 to 49 in April 2017 and 65 in May 2017. CT CAP / PET 03-16-16 showed progression lung, liver and bone. Letrozole and ibrance DCd on 03-19-16, with plans to begin chemotherapy. She  had acute neurologic symptoms 03-21-16 in [place], with MRI MRI head  reportedly showed left parietal metastasis and concern for leptomeningeal spread. She elected whole brain RT, given by Dr [name] ~ 03-23-16 thru 04-10-16.    She had evaluation for vaginal bleeding "from polyp", follows yearly with gynecologist in [place], up to date     Objective:  Vital signs in last 24 hours:  BP (!) 124/96 (BP Location: Right Arm, Patient Position: Sitting)   Pulse (!) 104   Temp 98 F (36.7 C) (Oral)   Resp 18   Wt 155 lb (70.3 kg)   SpO2 97%   BMI 25.79 kg/m  Weight stable. Looks better overall than when I saw her last in hospital, does not appear uncomfortable.  Alert, oriented and appropriate. Ambulatory without assistance .  Partial alopecia  HEENT: Cushingoid facies. PERRL, sclerae not icteric. Oral mucosa moist, tongue coated but no thrush on buccal mucosa/ palate or  posterior pharynx.   Neck supple. No JVD.  Lymphatics:no supraclavicular adenopathy Resp: clear to auscultation bilaterally and normal percussion bilaterally Cardio:tachy,  regular rate and rhythm. No gallop. GI: soft, nontender, not distended, no mass or organomegaly. Normally active bowel sounds. Surgical incision not remarkable. Musculoskeletal/ Extremities: without pitting edema, cords, tenderness Neuro: speech fluent and appropriate. CN intact. Strength 3+ equal UE and good/ equal LE. Stands easily from sitting position, no cerebellar symptoms. Skin without rash, ecchymosis, petechiae Portacath-without erythema or tenderness. Surgical glue intact.   Lab Results: CBC today WBC 7.7, ANC 6.4, Hgb 14.8, plt 149 CMET today Na 132, K 4.6, Cl 99, CO2 25, glu 95, BUN 26, creat 0.7, T bili 0.5, AP 117, AST 40, AST 83, Tprot 6.6, alb 3.2, Ca 8.7   Studies/Results: MRI HEAD WITHOUT CONTRAST  04-15-16  TECHNIQUE: Multiplanar, multiecho pulse sequences of the brain and surrounding structures were obtained without  intravenous contrast.  COMPARISON:  None.  FINDINGS: Diffusion imaging does not show any acute or subacute infarction. The brainstem and cerebellum are normal. Cerebral hemispheres are normal except for some low level FLAIR and T2 signal along the surface of the gyri in the left frontoparietal junction region. No restricted diffusion is noted in that area. There is probably some adjacent dural thickening. The patient is seen Tomi Bamberger osseous metastatic disease throughout the calvarium, the skullbase in the upper cervical spine. 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 The patient would not allow additional scanning and contrast-enhanced imaging is not performed.  No hydrocephalus. No primarily intraparenchymal metastatic lesion is suspected. Minimal small-vessel changes affect the white matter. No pituitary mass. Major vessels at the base of the brain show flow. No inflammatory sinus disease.  IMPRESSION: Osseous metastatic disease affecting the calvarium, skullbase and upper cervical spine.  No evidence of parenchymal metastatic lesion.  Dural thickening in the left frontoparietal region with some abnormal signal along the surface of the brain in the left frontoparietal region. 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  Unfortunately, the patient would not allow completion of the exam or other scans.   MRI spine 04-16-16 as previously noted.  Medications: I have reviewed the patient's current medications.  Medications reviewed with patient. She will continue diflucan, same pain medication, bid protonix until sees GI, prn miralax (which she has not needed), not using nystatin mouthwash, continue keppra and lisinopril.  Added peridex tid.  Decrease decadron to 4 mg every 8 hrs, with food  DISCUSSION Medications as above. Patient understands that she should call + increase decadron  back to q 6 hrs if any worsening of neurologic status.   She prefers every other week gemzar instead of day 1 day 8 q 21 day regimen, to allow family activities.  We have discussed that this chemo is in palliative attempt and certainly we will reconsider if she does not tolerate.  She wants to continue treatment if reasonable options; she would not want resuscitation or life support if irreversible brain dysfunction.  Per radiation oncology communication, Dr [name] feels slight improvement in MRI 10-18 compared with [place] 03-21-16  Reviewed gemzar. Verbal consent given.   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 She declined CSF evaluation. Whole brain radiation completed 04-10-16. Neurologic symptoms improved with decadron now 4 mg q 6 hrs; she is willing to continue steroids for present. Pamidronate given 04-16-16. Begin gemzar in palliative attempt today, and at her request will treat every other week instead of day 1 day 8.  She will call if concerns prior to next visit, but prefers to wait until day of next treatment 05-08-16 for next visit unless problems.    2.DNR if irreversible complications, but wants to try gemzar or other reasonable interventions in palliative attempt. She will be hospice appropriate when treatment discontinued. 3.oral thrush from steroids: diflucan. WIll treat until cleared then try to maintain on mycelex or nystatin mouthwash while still on steroids. 4.PAC needed for gemzar and placed by IR on 04-22-16 5.GERD which has been symptomatic including nocturnal aspiration. Continue protonix bid at least until she is seen by new GI physician 6.Lymphedema LUE: stable  7.BRCA testing negative in 2008.  8..Root canal and dental work for broken tooth 02-24-16, healed without problems. Aaron Edelman resumed 03-19-16, pamidronate given in hospital 04-16-16 9.pain  related to metastatic breast cancer: adequately controlled with present oxycontin.  10. Drug induced leukopenia/ neutropenia related to ibrance resolved off of that agent 11.post cholecystectomy. 12.needs flu vaccine: not given today with chemo and continued high dose steroids, tho we may need to give despite steroids soon.   All questions answered and she knows to call at any time if needed. Chemo orders confirmed. Time spent 30 min including >50% counseling and coordination of care. Cc Dr [name]. Will confirm appointment with [facility]/ send note to correct MD there.  Evlyn Clines, MD   04/24/2016, 9:06 PM

## 2016-04-24 NOTE — Patient Instructions (Addendum)
[facility] Discharge Instructions for Patients Receiving Chemotherapy  Today you received the following chemotherapy agents Gemzar. To help prevent nausea and vomiting after your treatment, we encourage you to take your nausea medication as directed.   If you develop nausea and vomiting that is not controlled by your nausea medication, call the clinic.   BELOW ARE SYMPTOMS THAT SHOULD BE REPORTED IMMEDIATELY:  *FEVER GREATER THAN 100.5 F  *CHILLS WITH OR WITHOUT FEVER  NAUSEA AND VOMITING THAT IS NOT CONTROLLED WITH YOUR NAUSEA MEDICATION  *UNUSUAL SHORTNESS OF BREATH  *UNUSUAL BRUISING OR BLEEDING  TENDERNESS IN MOUTH AND THROAT WITH OR WITHOUT PRESENCE OF ULCERS  *URINARY PROBLEMS  *BOWEL PROBLEMS  UNUSUAL RASH Items with * indicate a potential emergency and should be followed up as soon as possible.  Feel free to call the clinic you have any questions or concerns. The clinic phone number is [phone].  Please show the [place] at check-in to the Emergency Department and triage nurse.   Gemcitabine injection What is this medicine? GEMCITABINE (jem SIT a been) is a chemotherapy drug. This medicine is used to treat many types of cancer like breast cancer, lung cancer, pancreatic cancer, and ovarian cancer. This medicine may be used for other purposes; ask your health care provider or pharmacist if you have questions. What should I tell my health care provider before I take this medicine? They need to know if you have any of these conditions: -blood disorders -infection -kidney disease -liver disease -recent or ongoing radiation therapy -an unusual or allergic reaction to gemcitabine, other chemotherapy, other medicines, foods, dyes, or preservatives -pregnant or trying to get pregnant -breast-feeding How should I use this medicine? This drug is given as an infusion into a vein. It is administered in a hospital or clinic by a specially  trained health care professional. Talk to your pediatrician regarding the use of this medicine in children. Special care may be needed. Overdosage: If you think you have taken too much of this medicine contact a poison control center or emergency room at once. NOTE: This medicine is only for you. Do not share this medicine with others. What if I miss a dose? It is important not to miss your dose. Call your doctor or health care professional if you are unable to keep an appointment. What may interact with this medicine? -medicines to increase blood counts like filgrastim, pegfilgrastim, sargramostim -some other chemotherapy drugs like cisplatin -vaccines Talk to your doctor or health care professional before taking any of these medicines: -acetaminophen -aspirin -ibuprofen -ketoprofen -naproxen This list may not describe all possible interactions. Give your health care provider a list of all the medicines, herbs, non-prescription drugs, or dietary supplements you use. Also tell them if you smoke, drink alcohol, or use illegal drugs. Some items may interact with your medicine. What should I watch for while using this medicine? Visit your doctor for checks on your progress. This drug may make you feel generally unwell. This is not uncommon, as chemotherapy can affect healthy cells as well as cancer cells. Report any side effects. Continue your course of treatment even though you feel ill unless your doctor tells you to stop. In some cases, you may be given additional medicines to help with side effects. Follow all directions for their use. Call your doctor or health care professional for advice if you get a fever, chills or sore throat, or other symptoms of a cold or flu. Do not treat yourself.  This drug decreases your body's ability to fight infections. Try to avoid being around people who are sick. This medicine may increase your risk to bruise or bleed. Call your doctor or health care  professional if you notice any unusual bleeding. Be careful brushing and flossing your teeth or using a toothpick because you may get an infection or bleed more easily. If you have any dental work done, tell your dentist you are receiving this medicine. Avoid taking products that contain aspirin, acetaminophen, ibuprofen, naproxen, or ketoprofen unless instructed by your doctor. These medicines may hide a fever. Women should inform their doctor if they wish to become pregnant or think they might be pregnant. There is a potential for serious side effects to an unborn child. Talk to your health care professional or pharmacist for more information. Do not breast-feed an infant while taking this medicine. What side effects may I notice from receiving this medicine? Side effects that you should report to your doctor or health care professional as soon as possible: -allergic reactions like skin rash, itching or hives, swelling of the face, lips, or tongue -low blood counts - this medicine may decrease the number of white blood cells, red blood cells and platelets. You may be at increased risk for infections and bleeding. -signs of infection - fever or chills, cough, sore throat, pain or difficulty passing urine -signs of decreased platelets or bleeding - bruising, pinpoint red spots on the skin, black, tarry stools, blood in the urine -signs of decreased red blood cells - unusually weak or tired, fainting spells, lightheadedness -breathing problems -chest pain -mouth sores -nausea and vomiting -pain, swelling, redness at site where injected -pain, tingling, numbness in the hands or feet -stomach pain -swelling of ankles, feet, hands -unusual bleeding Side effects that usually do not require medical attention (report to your doctor or health care professional if they continue or are bothersome): -constipation -diarrhea -hair loss -loss of appetite -stomach upset This list may not describe all  possible side effects. Call your doctor for medical advice about side effects. You may report side effects to FDA at 1-800-FDA-1088. Where should I keep my medicine? This drug is given in a hospital or clinic and will not be stored at home. NOTE: This sheet is a summary. It may not cover all possible information. If you have questions about this medicine, talk to your doctor, pharmacist, or health care provider.    2016, Elsevier/Gold Standard. (2007-10-25 18:45:54) Denosumab injection What is this medicine? DENOSUMAB (den oh sue mab) slows bone breakdown. Prolia is used to treat osteoporosis in women after menopause and in men. Delton See is used to prevent bone fractures and other bone problems caused by cancer bone metastases. Delton See is also used to treat giant cell tumor of the bone. This medicine may be used for other purposes; ask your health care provider or pharmacist if you have questions. What should I tell my health care provider before I take this medicine? They need to know if you have any of these conditions: -dental disease -eczema -infection or history of infections -kidney disease or on dialysis -low blood calcium or vitamin D -malabsorption syndrome -scheduled to have surgery or tooth extraction -taking medicine that contains denosumab -thyroid or parathyroid disease -an unusual reaction to denosumab, other medicines, foods, dyes, or preservatives -pregnant or trying to get pregnant -breast-feeding How should I use this medicine? This medicine is for injection under the skin. It is given by a health care professional in a hospital  or clinic setting. If you are getting Prolia, a special MedGuide will be given to you by the pharmacist with each prescription and refill. Be sure to read this information carefully each time. For Prolia, talk to your pediatrician regarding the use of this medicine in children. Special care may be needed. For Delton See, talk to your pediatrician  regarding the use of this medicine in children. While this drug may be prescribed for children as young as 13 years for selected conditions, precautions do apply. Overdosage: If you think you have taken too much of this medicine contact a poison control center or emergency room at once. NOTE: This medicine is only for you. Do not share this medicine with others. What if I miss a dose? It is important not to miss your dose. Call your doctor or health care professional if you are unable to keep an appointment. What may interact with this medicine? Do not take this medicine with any of the following medications: -other medicines containing denosumab This medicine may also interact with the following medications: -medicines that suppress the immune system -medicines that treat cancer -steroid medicines like prednisone or cortisone This list may not describe all possible interactions. Give your health care provider a list of all the medicines, herbs, non-prescription drugs, or dietary supplements you use. Also tell them if you smoke, drink alcohol, or use illegal drugs. Some items may interact with your medicine. What should I watch for while using this medicine? Visit your doctor or health care professional for regular checks on your progress. Your doctor or health care professional may order blood tests and other tests to see how you are doing. Call your doctor or health care professional if you get a cold or other infection while receiving this medicine. Do not treat yourself. This medicine may decrease your body's ability to fight infection. You should make sure you get enough calcium and vitamin D while you are taking this medicine, unless your doctor tells you not to. Discuss the foods you eat and the vitamins you take with your health care professional. See your dentist regularly. Brush and floss your teeth as directed. Before you have any dental work done, tell your dentist you are receiving this  medicine. Do not become pregnant while taking this medicine or for 5 months after stopping it. Women should inform their doctor if they wish to become pregnant or think they might be pregnant. There is a potential for serious side effects to an unborn child. Talk to your health care professional or pharmacist for more information. What side effects may I notice from receiving this medicine? Side effects that you should report to your doctor or health care professional as soon as possible: -allergic reactions like skin rash, itching or hives, swelling of the face, lips, or tongue -breathing problems -chest pain -fast, irregular heartbeat -feeling faint or lightheaded, falls -fever, chills, or any other sign of infection -muscle spasms, tightening, or twitches -numbness or tingling -skin blisters or bumps, or is dry, peels, or red -slow healing or unexplained pain in the mouth or jaw -unusual bleeding or bruising Side effects that usually do not require medical attention (Report these to your doctor or health care professional if they continue or are bothersome.): -muscle pain -stomach upset, gas This list may not describe all possible side effects. Call your doctor for medical advice about side effects. You may report side effects to FDA at 1-800-FDA-1088. Where should I keep my medicine? This medicine is only given in  a clinic, doctor's office, or other health care setting and will not be stored at home. NOTE: This sheet is a summary. It may not cover all possible information. If you have questions about this medicine, talk to your doctor, pharmacist, or health care provider.    2016, Elsevier/Gold Standard. (2011-12-14 12:37:47)

## 2016-04-24 NOTE — Progress Notes (Signed)
REFERRING PROVIDER: Evlyn Clines, MD  PRIMARY PROVIDER:  DAROVSKY,BORIS, MD  PRIMARY REASON FOR VISIT:  1. 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 Family history of colon cancer   3. Family history of cancer      HISTORY OF PRESENT ILLNESS:   Jill Johnston, a 50 y.o. female, was seen for a [place] cancer genetics consultation at the request of Dr. Marko Plume due to a personal history of metastatic breast cancer first diagnosed at 84 and family history of cancer.  Jill Johnston reportedly had negative BRCA1/2 genetic testing in [place] in 2008.  Jill Johnston presents to clinic today to discuss the possibility of a hereditary predisposition to cancer, updated genetic testing options, and to further clarify her future cancer risks, as well as potential cancer risks for family members.   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 This was treated with left modified radical mastectomy, radiation, and chemotherapy.  She is currently being treated with radiation therapy due to her metastases.   HORMONAL RISK FACTORS:  Menarche was at age 26.  First live birth at age 38.  OCP use for approximately 0 years.  Ovaries intact: yes.  Hysterectomy: no.  Menopausal status: postmenopausal.  HRT use: 0 years. Colonoscopy: no; not examined. Mammogram within the last year: yes. Number of breast biopsies: 2. Up to date with pelvic exams:  yes. Any excessive radiation exposure/other exposures in the past:  Radiation with cancer treatment; she is from [place], but lived a considerable distance from [place]; reports some secondhand smoke exposure; she is out in the sun a lot, but covers herself and she sees a Paediatric nurse annually  Past Medical History:  Diagnosis Date  . Anxiety   . 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 Breast cancer metastasized to multiple sites ([place]) 04/12/2016  . GERD (gastroesophageal reflux disease)     Past Surgical History:  Procedure Laterality Date  . CHOLECYSTECTOMY    . ESOPHAGOGASTRODUODENOSCOPY (EGD) WITH PROPOFOL N/A 07/12/2015   Procedure: ESOPHAGOGASTRODUODENOSCOPY (EGD) WITH PROPOFOL;  Surgeon: Rogene Houston, MD;  Location: [facility];  Service: Endoscopy;  Laterality: N/A;  1:00  . IR GENERIC HISTORICAL  04/22/2016   IR US GUIDE VASC ACCESS RIGHT 04/22/2016 Markus Daft, MD [facility]  . IR GENERIC HISTORICAL  04/22/2016   IR FLUORO GUIDE PORT INSERTION RIGHT 04/22/2016 Markus Daft, MD [facility]  . MASTECTOMY  April 2009   Left breast with lymph node resection    Social History   Social History  . Marital status: Married    Spouse name: N/A  . Number of children: N/A  . Years of education: N/A   Social History Main Topics  . Smoking status: Never Smoker  . Smokeless tobacco: Never Used  . Alcohol use No  . Drug use: No  . Sexual activity: Yes   Other Topics Concern  . Not on file   Social History Narrative  . No narrative on file     FAMILY HISTORY:  We obtained a detailed, 4-generation family history.  Significant diagnoses are listed below: Family History  Problem Relation Age of Onset  . Heart attack Mother     Died age 69  . Heart disease Father     Died  age 50  . Brain cancer Father     pt not sure if he actually had this  . Cancer Maternal Grandmother     dx. NOS cancer at older age; d. 61  . Heart attack Maternal Grandfather 79  . Colon cancer Paternal Grandmother     d. 51s    Jill Johnston has one son who is currently 51.  She has one full brother who is 43 and for whom she has limited information.  She has three paternal half-siblings (one sister and two brothers).  These half-siblings are in their 30s-early 53s and have not had cancer to Jill Johnston knowledge.  She has  limited contact with them.    Jill Johnston mother died of a heart attack at 79.  She had two full sisters--both in their 60s and cancer-free.  Jill Johnston is unaware of any cancer history for her two female first cousins.  Her maternal grandmother passed away at 27 from an unspecified type of cancer.  Her grandfather died of a heart attack at 6.  Jill Johnston has limited information for her maternal great aunts/uncles and great grandparents.  Jill Johnston father had three full brothers and one full sister.  One brother died of a drug overdose at a young age.  The other siblings are in their 83s and are cancer-free.  Jill Johnston reports no known cancer history for her paternal first cousins, but she has limited contact with some of these relatives.  Her paternal grandmother died of colon cancer in her 64s. Her grandfather died at 56 and had no history of cancer.  She has no information for her paternal great aunts/uncles and great grandparents.  Jill Johnston is unaware of any previous family history of genetic testing for hereditary cancer risks.Patient's maternal ancestors are of [place] and [place] descent, and paternal ancestors are of [place] descent. There is no reported Ashkenazi Jewish ancestry. There is no known consanguinity.  GENETIC COUNSELING ASSESSMENT: Jill Johnston is a 50 y.o. female with a personal history of early-onset breast cancer which is somewhat suggestive of a hereditary breast cancer syndrome and predisposition to cancer. We, therefore, discussed and recommended the following at today's visit.   DISCUSSION: We reviewed the characteristics, features and inheritance patterns of hereditary cancer syndromes, particularly those caused by mutations within the CHEK2, PALB2, and additional genes that have more recently been added to genetic testing options. We also discussed genetic testing, including the appropriate family members to test, the process of testing, insurance coverage and  turn-around-time for results. We discussed the implications of a negative, positive and/or variant of uncertain significant result. We recommended Jill Johnston pursue genetic testing for the 28-gene [facility] Hereditary Cancer Panel through [employer].  The [facility] gene panel offered by [employer] includes sequencing and deletion/duplication testing of the following 28 genes: APC, ATM, BARD1, BMPR1A, BRCA1, BRCA2, BRIP1, CHD1, CDK4, CDKN2A, CHEK2, EPCAM (large rearrangement only), GREM1/SCG5, MLH1, MSH2, MSH6, MUTYH, NBN, PALB2, PMS2, POLD1, POLE, PTEN, RAD51C, RAD51D, SMAD4, STK11, and TP53.    Based on Jill Johnston personal and family history of cancer, she meets medical criteria for genetic testing. Despite that she meets criteria, she may still have an out of pocket cost. We discussed that if her out of pocket cost for testing is over $0, the laboratory will call and confirm whether she wants to proceed with testing.  If she agrees upon an out of pocket cost, she will be billed by the genetic testing laboratory.  PLAN: After considering the risks, benefits, and limitations, Jill Johnston  provided informed consent to pursue genetic testing and the blood sample was sent to [facility] for analysis of the 28-gene [facility] Hereditary Cancer Panel. Results should be available within approximately 2-3 weeks' time, at which point they will be disclosed by telephone to Jill Johnston, as will any additional recommendations warranted by these results. Jill Johnston will receive a summary of her genetic counseling visit and a copy of her results once available. This information will also be available in Epic. We encouraged Ms. Verret to remain in contact with cancer genetics annually so that we can continuously update the family history and inform her of any changes in cancer genetics and testing that may be of benefit for her family. Ms. Hernandez questions were answered to  her satisfaction today. Our contact information was provided should additional questions or concerns arise.  Thank you for the referral and allowing [place] to share in the care of your patient.   Jeanine Luz, MS, [facility] Certified Genetic Counselor [email or web address] Phone: [phone]  The patient was seen for a total of 60 minutes in face-to-face genetic counseling.  This patient was discussed with Drs. Magrinat, Lindi Adie and/or Burr Medico who agrees with the above.    _______________________________________________________________________ For Office Staff:  Number of people involved in session: 2 Was an Intern/ student involved with case: no

## 2016-04-24 NOTE — Progress Notes (Signed)
Ok to treat with HR 108 today per MD Marko Plume

## 2016-04-25 LAB — CANCER ANTIGEN 27.29: CAN 27.29: 178.8 U/mL — AB (ref 0.0–38.6)

## 2016-04-27 ENCOUNTER — Telehealth: Payer: Self-pay

## 2016-04-27 ENCOUNTER — Other Ambulatory Visit: Payer: BLUE CROSS/BLUE SHIELD

## 2016-04-27 ENCOUNTER — Ambulatory Visit: Payer: BLUE CROSS/BLUE SHIELD

## 2016-04-27 ENCOUNTER — Encounter: Payer: Self-pay | Admitting: Oncology

## 2016-04-27 NOTE — Telephone Encounter (Signed)
Ms truxal is doing well after treatment 04-24-16.  She gets tired easily and the steroids continue to make her dizzy and have difficulty reading for any length of time. She is drinking fluids well.  Appetite is anot as good. She is moving her bowels but stool are hard.  She will take some Miralax today and as needed. She knows to call [phone] if she has any questions or concerns.

## 2016-04-27 NOTE — Progress Notes (Signed)
Medical Oncology   [facility] The pt will see Dr. Alessandra Bevels on 05-28-16@9 :33.   10-27 office note faxed to [phone]   L.Marko Plume, MD

## 2016-04-27 NOTE — Telephone Encounter (Signed)
-----   Message from Herschell Dimes, RN sent at 04/24/2016  4:15 PM EDT ----- Regarding: Marko Plume- first time Gemzar. Contact: [phone] Dr. Mariana Kaufman patient tolerated her first Gemzar today well. [phone].

## 2016-04-28 ENCOUNTER — Telehealth: Payer: Self-pay | Admitting: *Deleted

## 2016-04-28 NOTE — Telephone Encounter (Signed)
Told Mr Kozy to have Ms Newsum take a 1/2 a bottle of Magnesium of Citrate over ice. If no good evacuation in 2-3 hrs, Ms Joyce can take the other half off the bottle. Pt is uncomfortable.  Mr Jablonowski  Stated he will try the Mag Citrate and if the 1/2 bottle not effective he will take his wife to the ED.

## 2016-04-28 NOTE — Telephone Encounter (Signed)
"  My wife is constipated after receiving first chemotherapy last week.  She should have told someone yesterday.  [place] daily.  Today we tried a water enema which usually works but I think I can see the stool.  She's blocked pretty bad.  Water is coming out going everywhere.  What do we need to do next.  Call me at [phone]." Did not want to answer assessment questions but admits to abdomen being bloated.  Has not touched her tummy to see if tender.  Denies N/V.

## 2016-04-29 NOTE — Telephone Encounter (Signed)
Ms Woulard did not take any Mag Citrate last night.  She did pass the hard stool.  Ms Burness stated that it was like a brick. The rectal area is sore. Not a lot of bleeding post evacuation.  Encouraged her to soak in the tub to sooth the area. Ms Thibaut still feels taht there is stool in bowel.  She only got hard stool out last evening. No subsequent BM. Took Miralax ~0800.  Told her to take 1/2 bottle of Ma Citrate now.  Will follow up with Ms Corio later to see if bowels moved.

## 2016-04-29 NOTE — Telephone Encounter (Signed)
Ms Stanko did have another BM with a moderate amount of  formed hard stool.  She did soak in the tub. She is feeling better. She was hungery and ate. Recommended taking the other half of the [place] now and pick up stool softener "colace" OTC.Take 1 tab tonight and 1 in am an continue BID. She will pick up the colace but does not want to take Mag Citrate now. She is tired and feels good right now.Suggested  a capful of Miralax  tonight and the other half of the Mag citrate in am if no goo evacuation of stool by am. Pt more amenable to this suggestion. Pt appreciated the call.

## 2016-04-30 ENCOUNTER — Ambulatory Visit: Payer: BLUE CROSS/BLUE SHIELD | Admitting: Oncology

## 2016-04-30 ENCOUNTER — Other Ambulatory Visit: Payer: BLUE CROSS/BLUE SHIELD

## 2016-05-04 ENCOUNTER — Emergency Department (HOSPITAL_COMMUNITY): Payer: BLUE CROSS/BLUE SHIELD

## 2016-05-04 ENCOUNTER — Emergency Department (HOSPITAL_COMMUNITY)
Admission: EM | Admit: 2016-05-04 | Discharge: 2016-05-04 | Discharge status: Absent without leave | Payer: BLUE CROSS/BLUE SHIELD

## 2016-05-04 ENCOUNTER — Other Ambulatory Visit: Payer: Self-pay

## 2016-05-04 ENCOUNTER — Emergency Department (HOSPITAL_COMMUNITY)
Admission: EM | Admit: 2016-05-04 | Discharge: 2016-05-04 | Disposition: A | Discharge status: Home / Self Care | Payer: BLUE CROSS/BLUE SHIELD | Attending: Physician Assistant | Admitting: Physician Assistant

## 2016-05-04 ENCOUNTER — Other Ambulatory Visit: Payer: Self-pay | Admitting: Oncology

## 2016-05-04 ENCOUNTER — Ambulatory Visit: Payer: BLUE CROSS/BLUE SHIELD

## 2016-05-04 ENCOUNTER — Encounter (HOSPITAL_COMMUNITY): Payer: Self-pay | Admitting: Emergency Medicine

## 2016-05-04 ENCOUNTER — Telehealth: Payer: Self-pay | Admitting: *Deleted

## 2016-05-04 ENCOUNTER — Other Ambulatory Visit: Payer: BLUE CROSS/BLUE SHIELD

## 2016-05-04 DIAGNOSIS — Z79899 Other long term (current) drug therapy: Secondary | ICD-10-CM | POA: Insufficient documentation

## 2016-05-04 DIAGNOSIS — Z8505 Personal history of malignant neoplasm of liver: Secondary | ICD-10-CM | POA: Diagnosis not present

## 2016-05-04 DIAGNOSIS — Z853 Personal history of malignant neoplasm of breast: Secondary | ICD-10-CM | POA: Diagnosis not present

## 2016-05-04 DIAGNOSIS — Z85118 Personal history of other malignant neoplasm of bronchus and lung: Secondary | ICD-10-CM | POA: Diagnosis not present

## 2016-05-04 DIAGNOSIS — R0602 Shortness of breath: Secondary | ICD-10-CM | POA: Insufficient documentation

## 2016-05-04 DIAGNOSIS — T380X5A Adverse effect of glucocorticoids and synthetic analogues, initial encounter: Secondary | ICD-10-CM | POA: Diagnosis not present

## 2016-05-04 DIAGNOSIS — Z85830 Personal history of malignant neoplasm of bone: Secondary | ICD-10-CM | POA: Diagnosis not present

## 2016-05-04 DIAGNOSIS — I10 Essential (primary) hypertension: Secondary | ICD-10-CM | POA: Insufficient documentation

## 2016-05-04 DIAGNOSIS — T887XXA Unspecified adverse effect of drug or medicament, initial encounter: Secondary | ICD-10-CM

## 2016-05-04 HISTORY — DX: Essential (primary) hypertension: I10

## 2016-05-04 LAB — CBC WITH DIFFERENTIAL/PLATELET
Basophils Absolute: 0 10*3/uL (ref 0.0–0.1)
Basophils Relative: 0 %
EOS ABS: 0 10*3/uL (ref 0.0–0.7)
Eosinophils Relative: 0 %
HEMATOCRIT: 41.5 % (ref 36.0–46.0)
HEMOGLOBIN: 13.8 g/dL (ref 12.0–15.0)
LYMPHS ABS: 0.5 10*3/uL — AB (ref 0.7–4.0)
Lymphocytes Relative: 13 %
MCH: 34 pg (ref 26.0–34.0)
MCHC: 33.3 g/dL (ref 30.0–36.0)
MCV: 102.2 fL — ABNORMAL HIGH (ref 78.0–100.0)
Monocytes Absolute: 0.4 10*3/uL (ref 0.1–1.0)
Monocytes Relative: 9 %
NEUTROS ABS: 3.3 10*3/uL (ref 1.7–7.7)
NEUTROS PCT: 78 %
Platelets: 77 10*3/uL — ABNORMAL LOW (ref 150–400)
RBC: 4.06 MIL/uL (ref 3.87–5.11)
RDW: 14.2 % (ref 11.5–15.5)
WBC: 4.3 10*3/uL (ref 4.0–10.5)

## 2016-05-04 LAB — BLOOD GAS, VENOUS
ACID-BASE EXCESS: 3.2 mmol/L — AB (ref 0.0–2.0)
Bicarbonate: 26 mmol/L (ref 20.0–28.0)
DRAWN BY: 295031
FIO2: 21
O2 Saturation: 75.7 %
PH VEN: 7.481 — AB (ref 7.250–7.430)
Patient temperature: 98.6
pCO2, Ven: 35.2 mmHg — ABNORMAL LOW (ref 44.0–60.0)
pO2, Ven: 39.1 mmHg (ref 32.0–45.0)

## 2016-05-04 LAB — I-STAT CG4 LACTIC ACID, ED
LACTIC ACID, VENOUS: 2.09 mmol/L — AB (ref 0.5–1.9)
Lactic Acid, Venous: 3.87 mmol/L (ref 0.5–1.9)

## 2016-05-04 LAB — COMPREHENSIVE METABOLIC PANEL
ALBUMIN: 3.5 g/dL (ref 3.5–5.0)
ALK PHOS: 94 U/L (ref 38–126)
ALT: 130 U/L — AB (ref 14–54)
AST: 48 U/L — ABNORMAL HIGH (ref 15–41)
Anion gap: 7 (ref 5–15)
BUN: 18 mg/dL (ref 6–20)
CALCIUM: 8.2 mg/dL — AB (ref 8.9–10.3)
CO2: 25 mmol/L (ref 22–32)
CREATININE: 0.61 mg/dL (ref 0.44–1.00)
Chloride: 100 mmol/L — ABNORMAL LOW (ref 101–111)
GFR calc non Af Amer: >60 mL/min (ref >60)
GLUCOSE: 98 mg/dL (ref 65–99)
Potassium: 3.9 mmol/L (ref 3.5–5.1)
SODIUM: 132 mmol/L — AB (ref 135–145)
Total Bilirubin: 0.8 mg/dL (ref 0.3–1.2)
Total Protein: 6.3 g/dL — ABNORMAL LOW (ref 6.5–8.1)

## 2016-05-04 LAB — I-STAT TROPONIN, ED: Troponin i, poc: 0.02 ng/mL (ref 0.00–0.08)

## 2016-05-04 LAB — BRAIN NATRIURETIC PEPTIDE: B Natriuretic Peptide: 30.4 pg/mL (ref 0.0–100.0)

## 2016-05-04 MED ORDER — SODIUM CHLORIDE 0.9 % IV BOLUS (SEPSIS)
1000.0000 mL | Freq: Once | INTRAVENOUS | Status: AC
  Administered 2016-05-04: 1000 mL via INTRAVENOUS

## 2016-05-04 MED ORDER — HEPARIN SOD (PORK) LOCK FLUSH 100 UNIT/ML IV SOLN
500.0000 [IU] | Freq: Once | INTRAVENOUS | Status: AC
  Administered 2016-05-04: 500 [IU]
  Filled 2016-05-04: qty 5

## 2016-05-04 MED ORDER — ALBUTEROL SULFATE (2.5 MG/3ML) 0.083% IN NEBU
5.0000 mg | INHALATION_SOLUTION | Freq: Once | RESPIRATORY_TRACT | Status: AC
  Administered 2016-05-04: 5 mg via RESPIRATORY_TRACT
  Filled 2016-05-04: qty 6

## 2016-05-04 MED ORDER — IOPAMIDOL (ISOVUE-370) INJECTION 76%
100.0000 mL | Freq: Once | INTRAVENOUS | Status: AC | PRN
  Administered 2016-05-04: 66 mL via INTRAVENOUS

## 2016-05-04 NOTE — Discharge Instructions (Signed)
Please discontinue your Keppra. Please use Decadron, but he may decrease the dose to 2 mg. Please follow-up with Friday with your oncologist. Please return immediately if you've any signs of infection such as fever, shortness of breath, urinary pain, or any other concerns. As we discussed you are at high risk for infection given here chemotherapy and some of the labs that we saw today so we understand your wish to return home but please feel free to return with any concerns.

## 2016-05-04 NOTE — ED Notes (Signed)
Provided a warm blanket and applied an arm restricted bracelet to the left arm.

## 2016-05-04 NOTE — ED Notes (Signed)
I attemped to collect labs and was unsuccessful patient wants her port access

## 2016-05-04 NOTE — ED Provider Notes (Signed)
[facility] DEPT Provider Note   CSN: [id number] Arrival date & time: 05/04/16  1152     History   Chief Complaint Chief Complaint  Patient presents with  . Shortness of Breath  . Facial Swelling  . Chemo    HPI Jill Johnston is a 50 y.o. female.  HPI   She is a 50 year old female with metastatic breast cancer. She is presenting today with symptoms of swelling and shortness of breath. Patient reports that she started steroids and that is caused her to have swelling. She thinks that every time she takes steroids she has swelling and shortness of breath. However on further dissection of the symptoms it appears that shortness breath have been coming and going but is been present worse for the last 3 days.  Past Medical History:  Diagnosis Date  . Anxiety   . 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 Breast cancer metastasized to multiple sites ([place]) 04/12/2016   bone, liver, lung and brain  . GERD (gastroesophageal reflux disease)   . Hypertension     Patient Active Problem List   Diagnosis Date Noted  . Metastasis to liver ([place]) 04/24/2016  . Breast cancer metastasized to lung, left ([place]) 04/24/2016  . Portacath in place 04/24/2016  . Oral thrush 04/24/2016  . Numbness 04/16/2016  . Essential hypertension 04/16/2016  . Carcinoma of left breast metastatic to multiple sites ([place]) 04/12/2016  . 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 Breast cancer metastasized to lung ([place]) 03/20/2016  . 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 Gastroesophageal reflux disease with esophagitis 03/20/2016  . Drug-induced leukopenia ([place]) 01/26/2016  . High risk medication use 01/26/2016  . Lymphedema of left arm 01/12/2016  . Status post cholecystectomy 01/12/2016  . Cancer associated pain 01/12/2016  . Breast cancer metastasized to bone, left ([place]) 01/12/2016  . Gastroesophageal  reflux disease 07/04/2015  . Bone metastases ([place]) 01/07/2015  . Malignant neoplasm of left breast [facility])     Past Surgical History:  Procedure Laterality Date  . CHOLECYSTECTOMY    . ESOPHAGOGASTRODUODENOSCOPY (EGD) WITH PROPOFOL N/A 07/12/2015   Procedure: ESOPHAGOGASTRODUODENOSCOPY (EGD) WITH PROPOFOL;  Surgeon: Rogene Houston, MD;  Location: [facility];  Service: Endoscopy;  Laterality: N/A;  1:00  . IR GENERIC HISTORICAL  04/22/2016   IR US GUIDE VASC ACCESS RIGHT 04/22/2016 Markus Daft, MD [facility]-INTER[facility] RAD  . IR GENERIC HISTORICAL  04/22/2016   IR FLUORO GUIDE PORT INSERTION RIGHT 04/22/2016 Markus Daft, MD [facility]INTER[facility]  . MASTECTOMY  April 2009   Left breast with lymph node resection    OB History    No data available       Home Medications    Prior to Admission medications   Medication Sig Start Date End Date Taking? Authorizing Provider  chlorhexidine (PERIDEX) 0.12 % solution Use as directed 15 mLs in the mouth or throat 3 (three) times daily. 04/24/16  Yes Lennis Marion Downer, MD  dexamethasone (DECADRON) 4 MG tablet Take one tablet po q 6 hours. Taper instructions to follow 04/17/16  Yes Florencia Reasons, MD  fluconazole (DIFLUCAN) 100 MG tablet Take 1 tablet (100 mg total) by mouth daily. 04/17/16  Yes Florencia Reasons, MD  levETIRAcetam (KEPPRA) 500 MG tablet Take 500 mg by mouth 2 (two) times daily.   Yes Historical Provider, MD  lidocaine-prilocaine (EMLA) cream Apply 1 application topically as needed. To port 1  hour before going to be accessed with needle. Cover with plastic wrap. 04/17/16  Yes Lennis Marion Downer, MD  lisinopril (PRINIVIL,ZESTRIL) 10 MG tablet Take 1 tablet (10 mg total) by mouth daily. 01/23/16  Yes Lennis Marion Downer, MD  oxyCODONE (OXYCONTIN) 30 MG 12 hr tablet Take 30 mg by mouth 2 (two) times daily. 04/10/16  Yes Lennis Marion Downer, MD  Oxycodone HCl 10 MG TABS Take 1 tablet by mouth every 3 (three) hours as needed for pain. 11/29/15  Yes Historical Provider, MD  pantoprazole  (PROTONIX) 40 MG tablet Take 1 tablet (40 mg total) by mouth 2 (two) times daily before a meal. 01/06/16  Yes Butch Penny, NP  polyethylene glycol (MIRALAX / GLYCOLAX) packet Take 17 grams by mouth every day. 10/11/14  Yes Historical Provider, MD  LORazepam (ATIVAN) 0.5 MG tablet 1 tablet every 6 hours as needed for nausea. Will make drowsy Patient not taking: Reported on 04/24/2016 04/21/16   Gordy Levan, MD  nystatin (MYCOSTATIN) 100000 UNIT/ML suspension Take 5 mLs (500,000 Units total) by mouth 4 (four) times daily. Patient not taking: Reported on 04/24/2016 04/17/16   Florencia Reasons, MD  ondansetron (ZOFRAN) 8 MG tablet Take 1 tablet (8 mg total) by mouth every 8 (eight) hours as needed for nausea or vomiting. Patient not taking: Reported on 04/24/2016 04/21/16   Gordy Levan, MD    Family History Family History  Problem Relation Age of Onset  . Heart attack Mother     Died age 76  . Heart disease Father     Died age 90  . Brain cancer Father     pt not sure if he actually had this  . Cancer Maternal Grandmother     dx. NOS cancer at older age; d. 48  . Heart attack Maternal Grandfather 79  . Colon cancer Paternal Grandmother     d. 5s    Social History Social History  Substance Use Topics  . Smoking status: Never Smoker  . Smokeless tobacco: Never Used  . Alcohol use No     Allergies   Patient has no known allergies.   Review of Systems Review of Systems  Constitutional: Negative for activity change, appetite change, fatigue and fever.  Respiratory: Positive for shortness of breath.   Cardiovascular: Negative for chest pain.  Genitourinary: Negative for dysuria.     Physical Exam Updated Vital Signs BP 112/89 (BP Location: Right Arm)   Pulse (!) 122   Temp 97.7 F (36.5 C) (Oral)   Resp 20   SpO2 98%   Physical Exam  Constitutional: She is oriented to person, place, and time. She appears well-developed and well-nourished.  HENT:  Head:  Normocephalic and atraumatic.  Cardiovascular: Normal heart sounds.   tachycardia  Pulmonary/Chest: No respiratory distress. She has no wheezes.  No tahcypnea, breath sounds normal, port present  Musculoskeletal: She exhibits edema.  Neurological: She is oriented to person, place, and time. No cranial nerve deficit.  Nursing note and vitals reviewed.    ED Treatments / Results  Labs (all labs ordered are listed, but only abnormal results are displayed) Labs Reviewed  BLOOD GAS, VENOUS - Abnormal; Notable for the following:       Result Value   pH, Ven 7.481 (*)    pCO2, Ven 35.2 (*)    Acid-Base Excess 3.2 (*)    All other components within normal limits  CBC WITH DIFFERENTIAL/PLATELET - Abnormal; Notable for the following:  MCV 102.2 (*)    Platelets 77 (*)    Lymphs Abs 0.5 (*)    All other components within normal limits  COMPREHENSIVE METABOLIC PANEL - Abnormal; Notable for the following:    Sodium 132 (*)    Chloride 100 (*)    Calcium 8.2 (*)    Total Protein 6.3 (*)    AST 48 (*)    ALT 130 (*)    All other components within normal limits  I-STAT CG4 LACTIC ACID, ED - Abnormal; Notable for the following:    Lactic Acid, Venous 2.09 (*)    All other components within normal limits  I-STAT CG4 LACTIC ACID, ED - Abnormal; Notable for the following:    Lactic Acid, Venous 3.87 (*)    All other components within normal limits  BRAIN NATRIURETIC PEPTIDE  I-STAT TROPOININ, ED    EKG  EKG Interpretation None       Radiology Dg Chest 2 View  Result Date: 05/04/2016 CLINICAL DATA:  Several hours of shortness of breath. Difficulty swallowing. Stage IV breast malignancy metastatic to the bones currently on chemo radiation. History of previous left mastectomy. EXAM: CHEST  2 VIEW COMPARISON:  Chest CT scan dated March 16, 2016 and PA and lateral chest x-ray of August 08, 2014. FINDINGS: The lungs are mildly hypo inflated. Subtle increased density is noted at  both lung bases. There is no pleural effusion or pneumothorax. The heart and pulmonary vascularity are normal. The mediastinum is normal in width. The power port catheter tip projects over the midportion of the SVC. The observed portions of the upper abdomen are normal. The left breast shadow is absent. The bony structures exhibit no acute abnormalities. IMPRESSION: Bibasilar atelectasis or infiltrate. The possibility of lymphatic spread of malignancy was raised on the previous CT scan. No CHF. No pulmonary parenchymal masses are observed. If the patient's symptoms might be related to pneumonia, follow-up PA and lateral chest X-ray would be recommended in 3-4 weeks following trial of antibiotic therapy to ensure resolution. Recommended in 3-4 weeks following trial of antibiotic therapy to ensure resolution. If lymphatic spread of malignancy is felt be most likely, chest CT scanning now would be recommended. Electronically Signed   By: David  Martinique M.D.   On: 05/04/2016 12:47   Ct Angio Chest Pe W And/or Wo Contrast  Result Date: 05/04/2016 CLINICAL DATA:  Cough with shortness of breath history of metastatic breast cancer to bone liver lung and brain EXAM: CT ANGIOGRAPHY CHEST WITH CONTRAST TECHNIQUE: Multidetector CT imaging of the chest was performed using the standard protocol during bolus administration of intravenous contrast. Multiplanar CT image reconstructions and MIPs were obtained to evaluate the vascular anatomy. CONTRAST:  66 mL Isovue 370 intravenous COMPARISON:  Chest x-ray 05/04/2016, CT 03/16/2016 FINDINGS: Cardiovascular: Satisfactory opacification of the pulmonary arteries to the segmental level. No evidence of pulmonary embolism. Normal heart size. No pericardial effusion. Vascular catheter tip at the proximal right atrium. Mediastinum/Nodes: No enlarged mediastinal, hilar, or axillary lymph nodes. Thyroid gland, trachea, and esophagus demonstrate no significant findings. Lungs/Pleura: Stable  5 mm mildly spiculated nodule in the right upper lobe, series 7, image number 21. Previously noted left perifissural nodule not as well appreciated on the current exam. Again visualized is diffuse septal thickening, right greater than left, findings have progressed bilaterally. Hazy attenuation is present within the upper lobes and bilateral lower lobes. There is partial atelectasis or consolidation in the right middle lobe and left lower lobe. Upper Abdomen:  Diffuse decreased density of the liver, consistent with fatty changes. Poorly defined enhancing masses are present within the liver, suspected to be increased although evaluation limited due to arterial phase exam. Surgical clips in the gallbladder fossa. Musculoskeletal: Again visualized is extensive skeletal metastatic disease of the spine and bilateral ribs, grossly similar compared to prior study. Review of the MIP images confirms the above findings. IMPRESSION: 1. No CT evidence for acute pulmonary embolus or aortic dissection. 2. Progression of diffuse bilateral septal thickening, right greater and than left. Concern previously raised for lymphangitic spread of tumor. Stable 5 mm spiculated nodule right upper lobe. Interim finding of partial atelectasis or consolidation in the right middle lobe and left lower lobe. 3. Multiple poorly visualized hepatic masses with fatty changes of the liver. Suspect that the masses have increased in size . 4. Extensive skeletal metastatic disease, grossly unchanged. Electronically Signed   By: Donavan Foil M.D.   On: 05/04/2016 14:39    Procedures Procedures (including critical care time)  Medications Ordered in ED Medications  albuterol (PROVENTIL) (2.5 MG/3ML) 0.083% nebulizer solution 5 mg (5 mg Nebulization Given 05/04/16 1248)  iopamidol (ISOVUE-370) 76 % injection 100 mL (66 mLs Intravenous Contrast Given 05/04/16 1405)  sodium chloride 0.9 % bolus 1,000 mL (0 mLs Intravenous Stopped 05/04/16 1716)  heparin  lock flush 100 unit/mL (500 Units Intracatheter Given 05/04/16 1741)     Initial Impression / Assessment and Plan / ED Course  I have reviewed the triage vital signs and the nursing notes.  Pertinent labs & imaging results that were available during my care of the patient were reviewed by me and considered in my medical decision making (see chart for details).  Clinical Course    Patient is a 50 year old female presenting with shortness of breath. Patient's concerned that she is having allergic reaction to her steroids and Keppra. She reports every time she takes it she becomes more short of breath and has more facial swelling. The swelling has occurred over the last week or longer. Patient swelling that she is concerned about is located on her face. It appears to be a side effect of steroids.  She maintains however that she does not want to take her steroids or Keppra anymore because they're causing allergic reaction. I'm concerned the patient's shortness of breath could be from either metastasis or pulmonary embolism.  Pt states that she feels a reaction after takign these two meds together.  No itching.  No fevers, and otherwise has no complaints.Patient is a difficult historian and her friend helps in clarifying details and providing a more linear timeline.  We'll get labs, lactate, CT angio.  CT angio negative.  Was able to talk directly with pt's oncologist. We agreed it could be difficult to interpert patient's complaints.  Pt had stopped steroids previsouly.  We discussed that she should not abruptly stop the steroids.  Dr. Carlean Jews felt that she may not need the Keppra because she was unsure if there was ever a seizure (diagnosed/discussed at a different hosptial in [place]).  She ercommended tellingthe patient to stop KEppra, decrease steroids to 2 mg and follow up with her in the office on Friday as planned.   Initailly lactate elevated. Lactate in her midstay was elevated, so we gave another  liter. It was moving the wrond direction, although patient felt improved. Patient would like to return home, we offered to give more fluids and repeat but she saidvshe would like to return home regardless.  Patient  has no complaints currently.  Discussed extensively with husband and pateint.   Given her extensive metatastic cancer,  I understand her interest in returning home.  Strict precautions were expressed.   Final Clinical Impressions(s) / ED Diagnoses   Final diagnoses:  Non-dose-related adverse reaction to medication, initial encounter    New Prescriptions Discharge Medication List as of 05/04/2016  5:24 PM       Jaeleen Inzunza Julio Alm, MD 05/04/16 1933

## 2016-05-04 NOTE — ED Notes (Signed)
Made respiratory aware of VBG in mini lab to be ran.

## 2016-05-04 NOTE — ED Notes (Signed)
Delay on vitals pt is in CT 

## 2016-05-04 NOTE — ED Notes (Signed)
Patient returning from CT

## 2016-05-04 NOTE — ED Notes (Signed)
Patient transported to CT 

## 2016-05-04 NOTE — ED Triage Notes (Signed)
Pt states that she has breast cancer.  Pt has had last chemo on last Friday.  Pt states that she has had SHOB, trouble swallowing, denies pain.  Appears to be working to breathe.  98%on RA.

## 2016-05-04 NOTE — Telephone Encounter (Signed)
Spoke with pt in lobby, she left the ED due to long wait. She reports shortness of breath. Informed her and caregiver that the best place to have dyspnea evaluated is to be seen in ED.  Discussed with Dr. Marko Plume, she has no availability on her schedule. Pt sent back to ED, called triage to let them know she was coming back. They are aware that she is a chemo pt.

## 2016-05-06 ENCOUNTER — Telehealth: Payer: Self-pay | Admitting: Genetic Counselor

## 2016-05-06 ENCOUNTER — Other Ambulatory Visit: Payer: Self-pay | Admitting: Oncology

## 2016-05-06 NOTE — Telephone Encounter (Signed)
Discussed with Jill Johnston that her genetic test result was negative for mutations within any of 28 genes on the [facility] Hereditary Cancer Panel.  Additionally, no variants of uncertain significance (VUSes) were found.  Discussed that this most likely means that her cancer was sporadic, as is the case with most cancers.  Other women in the family, such as her sister, are still at a higher risk for breast cancer due to this history.  [name] should continue to follow her doctors' recommendations.  She is welcome to call with any questions she may have.  I will mail her a copy of her results.

## 2016-05-07 ENCOUNTER — Ambulatory Visit: Payer: Self-pay | Admitting: Genetic Counselor

## 2016-05-07 DIAGNOSIS — Z809 Family history of malignant neoplasm, unspecified: Secondary | ICD-10-CM

## 2016-05-07 DIAGNOSIS — Z1379 Encounter for other screening for genetic and chromosomal anomalies: Secondary | ICD-10-CM

## 2016-05-07 DIAGNOSIS — Z800 Family history of malignant neoplasm of digestive organs: Secondary | ICD-10-CM

## 2016-05-07 DIAGNOSIS — C50919 Malignant neoplasm of unspecified site of unspecified female breast: Secondary | ICD-10-CM

## 2016-05-08 ENCOUNTER — Ambulatory Visit: Payer: BLUE CROSS/BLUE SHIELD

## 2016-05-08 ENCOUNTER — Other Ambulatory Visit (HOSPITAL_BASED_OUTPATIENT_CLINIC_OR_DEPARTMENT_OTHER): Payer: BLUE CROSS/BLUE SHIELD

## 2016-05-08 ENCOUNTER — Ambulatory Visit (HOSPITAL_BASED_OUTPATIENT_CLINIC_OR_DEPARTMENT_OTHER): Payer: BLUE CROSS/BLUE SHIELD | Admitting: Oncology

## 2016-05-08 ENCOUNTER — Encounter: Payer: Self-pay | Admitting: Oncology

## 2016-05-08 ENCOUNTER — Telehealth: Payer: Self-pay | Admitting: Oncology

## 2016-05-08 ENCOUNTER — Other Ambulatory Visit: Payer: Self-pay | Admitting: Oncology

## 2016-05-08 VITALS — BP 128/82 | HR 121 | Temp 97.5°F | Resp 18 | Ht 65.0 in | Wt 154.7 lb

## 2016-05-08 DIAGNOSIS — C50212 Malignant neoplasm of upper-inner quadrant of left female breast: Secondary | ICD-10-CM

## 2016-05-08 DIAGNOSIS — C7800 Secondary malignant neoplasm of unspecified lung: Secondary | ICD-10-CM

## 2016-05-08 DIAGNOSIS — C7951 Secondary malignant neoplasm of bone: Secondary | ICD-10-CM | POA: Diagnosis not present

## 2016-05-08 DIAGNOSIS — C787 Secondary malignant neoplasm of liver and intrahepatic bile duct: Secondary | ICD-10-CM

## 2016-05-08 DIAGNOSIS — C7949 Secondary malignant neoplasm of other parts of nervous system: Secondary | ICD-10-CM

## 2016-05-08 DIAGNOSIS — Z95828 Presence of other vascular implants and grafts: Secondary | ICD-10-CM

## 2016-05-08 DIAGNOSIS — T451X5A Adverse effect of antineoplastic and immunosuppressive drugs, initial encounter: Secondary | ICD-10-CM

## 2016-05-08 DIAGNOSIS — G893 Neoplasm related pain (acute) (chronic): Secondary | ICD-10-CM

## 2016-05-08 DIAGNOSIS — D701 Agranulocytosis secondary to cancer chemotherapy: Secondary | ICD-10-CM

## 2016-05-08 DIAGNOSIS — B370 Candidal stomatitis: Secondary | ICD-10-CM

## 2016-05-08 DIAGNOSIS — K219 Gastro-esophageal reflux disease without esophagitis: Secondary | ICD-10-CM

## 2016-05-08 DIAGNOSIS — D702 Other drug-induced agranulocytosis: Secondary | ICD-10-CM

## 2016-05-08 DIAGNOSIS — C7802 Secondary malignant neoplasm of left lung: Secondary | ICD-10-CM

## 2016-05-08 DIAGNOSIS — C50912 Malignant neoplasm of unspecified site of left female breast: Secondary | ICD-10-CM

## 2016-05-08 DIAGNOSIS — D6959 Other secondary thrombocytopenia: Secondary | ICD-10-CM

## 2016-05-08 DIAGNOSIS — C7931 Secondary malignant neoplasm of brain: Secondary | ICD-10-CM

## 2016-05-08 DIAGNOSIS — I890 Lymphedema, not elsewhere classified: Secondary | ICD-10-CM

## 2016-05-08 LAB — CBC WITH DIFFERENTIAL/PLATELET
BASO%: 0.5 % (ref 0.0–2.0)
BASOS ABS: 0 10*3/uL (ref 0.0–0.1)
EOS ABS: 0 10*3/uL (ref 0.0–0.5)
EOS%: 1 % (ref 0.0–7.0)
HCT: 38.4 % (ref 34.8–46.6)
HEMOGLOBIN: 12.6 g/dL (ref 11.6–15.9)
LYMPH%: 24.2 % (ref 14.0–49.7)
MCH: 34.1 pg — AB (ref 25.1–34.0)
MCHC: 32.8 g/dL (ref 31.5–36.0)
MCV: 103.8 fL — AB (ref 79.5–101.0)
MONO#: 0.2 10*3/uL (ref 0.1–0.9)
MONO%: 8.6 % (ref 0.0–14.0)
NEUT#: 1.3 10*3/uL — ABNORMAL LOW (ref 1.5–6.5)
NEUT%: 65.7 % (ref 38.4–76.8)
Platelets: 88 10*3/uL — ABNORMAL LOW (ref 145–400)
RBC: 3.7 10*6/uL (ref 3.70–5.45)
RDW: 14.3 % (ref 11.2–14.5)
WBC: 2 10*3/uL — ABNORMAL LOW (ref 3.9–10.3)
lymph#: 0.5 10*3/uL — ABNORMAL LOW (ref 0.9–3.3)

## 2016-05-08 LAB — COMPREHENSIVE METABOLIC PANEL
ALK PHOS: 128 U/L (ref 40–150)
ALT: 121 U/L — ABNORMAL HIGH (ref 0–55)
AST: 43 U/L — AB (ref 5–34)
Albumin: 2.8 g/dL — ABNORMAL LOW (ref 3.5–5.0)
Anion Gap: 10 mEq/L (ref 3–11)
BUN: 16.6 mg/dL (ref 7.0–26.0)
CHLORIDE: 102 meq/L (ref 98–109)
CO2: 25 meq/L (ref 22–29)
Calcium: 8.7 mg/dL (ref 8.4–10.4)
Creatinine: 0.7 mg/dL (ref 0.6–1.1)
GLUCOSE: 130 mg/dL (ref 70–140)
POTASSIUM: 3.5 meq/L (ref 3.5–5.1)
SODIUM: 138 meq/L (ref 136–145)
Total Bilirubin: 0.47 mg/dL (ref 0.20–1.20)
Total Protein: 5.9 g/dL — ABNORMAL LOW (ref 6.4–8.3)

## 2016-05-08 LAB — TECHNOLOGIST REVIEW

## 2016-05-08 MED ORDER — HEPARIN SOD (PORK) LOCK FLUSH 100 UNIT/ML IV SOLN
500.0000 [IU] | Freq: Once | INTRAVENOUS | Status: AC
  Administered 2016-05-08: 500 [IU] via INTRAVENOUS
  Filled 2016-05-08: qty 5

## 2016-05-08 MED ORDER — FLUCONAZOLE 100 MG PO TABS: 100.0000 mg | ORAL_TABLET | Freq: Every day | ORAL | 0 refills | Status: DC | PRN

## 2016-05-08 MED ORDER — OXYCODONE HCL ER 30 MG PO T12A: 30.0000 mg | EXTENDED_RELEASE_TABLET | Freq: Two times a day (BID) | ORAL | 0 refills | Status: DC

## 2016-05-08 MED ORDER — SODIUM CHLORIDE 0.9% FLUSH
10.0000 mL | INTRAVENOUS | Status: DC | PRN
  Administered 2016-05-08: 10 mL via INTRAVENOUS
  Filled 2016-05-08: qty 10

## 2016-05-08 NOTE — Progress Notes (Signed)
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 She has begun gemzar in palliative attempt, planned every other week however ANC and platelets too low today for cycle 2.  She had last bisphosphonate as pamidronate in hospital 04-16-16.  Patient had severe constipation on 10-31, resolved by 11-1 and bowels moving daily since then.  She was seen in ED on 05-04-16 with complaints of SOB, facial swelling and poor tolerance of Keppra and decadron. CTA chest then showed no PE, tho concern for lymphangitic tumor in lungs. At patient's insistence, decadron was decreased then to 2 mg daily, which she has continued, and keppra DCd (keppra begun in outside ED with initial diagnosis of CNS involvement, no documented seizure).  Patient and husband report that she is feeling better overall today. Pain is well controlled, some discomfort intermittently left lateral ribs. She was not SOB walking in office, no cough or sputum, no wheezing. Oral and esophageal thrush is improved, not quite finished with difulcan. Facial swelling from steroids is upsetting to her, no improvement yet. She has had no recurrence of loss of sensation thru body as at last hospitalization, no LOC or seizure concerns, no HA, no other different neurologic symptoms. She has had no fever on the steroids, no symptoms of infection. She is sleeping better on lower dose steroids. No problems with PAC. No bleeding or unusual bruising. GERD better on protonix. Alopecia since cranial radiation. Remainder of 10 point Review of Systems negative.     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 The breast cancer was ER + 100%, PR 1%, HER 2 IHC/FISH negative, Ki67 52%, BRCA 1/2 negative, initial CA 2729 WNL. She had neoadjuvant AC x4 taxol x12 dose dense from 06-10-2007 thru 09-28-2007 by Dr B.Darovsky. 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 She had radiation to chest wall, supraclavicular region and left axilla 50.4 Gy with boost to mastectomy scar to 60.4 Gy, by Dr [name] from 12-15-2007 thru 01-31-2008. She was on tamoxifen from 02-20-2008 thru 09-25-2014. She developed pain in ribs 08-2014, with CA 2729 52 and PET CT and MRI with diffuse bone metastatic disease, no cord compression, small lung lesions and intrathoracic adenopathy. 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 She began Denosumab 120 mcg monthly starting 4-7-201, and letrozole + palbociclib beginning4-12-2014. She was seen in consultation by Dr [name] [phone]. Palbociclib dose decreased to 100 mg daily x 21 q 28 days due to neutropenia. CBC on 10-25-15: WBC 2.1, Hgb 13, plt 186, ANC 0.9 PET 09-25-2014 in [facility] and CT CAP at [place] 10-25-15 with no clear pulmonary involvement, stable 2 cm lesion at dome of liver and 11 mm left hepatic lobe lesion. She had right tomo mammogram at [facility] 09-17-15, with heterogeneously dense breast tissue but no other mammographic findings of concern.  CA 2729 increased from 38 in 08-2015 to 49 in April 2017 and 65 in May 2017. CT CAP / PET 03-16-16 showed progression lung, liver and bone. Letrozole and ibrance DCd on 03-19-16, with plans to begin chemotherapy. She had acute neurologic symptoms 03-21-16 in [place],  with MRI MRI head reportedly showed left parietal metastasis and concern for leptomeningeal spread. She elected whole brain RT, given by Dr [name] ~ 03-23-16 thru 04-10-16.  She had first gemzar on 04-24-16  She had evaluation for vaginal bleeding "from polyp", follows yearly with gynecologist in [place], up to date    Objective:  Vital signs in last 24 hours:  BP 128/82 (Patient Position: Sitting)   Pulse (!) 121   Temp 97.5 F (36.4 C) (Oral)   Resp 18   Ht 5' 5"  (1.651 m)   Wt 154 lb 11.2 oz (70.2 kg)   SpO2 99%   BMI 25.74 kg/m  Weight stable. Cushingoid. Respirations not labored. Does not appear in any distress. Alert, oriented and appropriate, talkative. Ambulatory without assistance.  Alopecia  HEENT:PERRL, sclerae not icteric. Oral mucosa moist without lesions, minimal coating on tongue with no thrush on buccal mucosa, posterior pharynx clear.  Neck supple. No JVD.  Lymphatics:no cervical,supraclavicular adenopathy Resp: clear to auscultation bilaterally and normal percussion bilaterally Cardio: regular rate and rhythm. No gallop. Clear heart sounds GI: abdomen soft, nontender including epigastrium, not distended, no mass or organomegaly. Normally active bowel sounds. . Musculoskeletal/ Extremities:LE ithout pitting edema, cords, tenderness Neuro: speech fluent, moves extremities equally, gait not remarkable. PSYCH appropriate mood and affect Skin without rash, ecchymosis, petechiae Portacath-without erythema or tenderness  Lab Results:  Results for orders placed or performed in visit on 05/08/16  TECHNOLOGIST REVIEW  Result Value Ref Range   Technologist Review Occassional Metas and Myelocytes present   CBC with Differential  Result Value Ref Range   WBC 2.0 (L) 3.9 - 10.3 10e3/uL   NEUT# 1.3 (L) 1.5 - 6.5 10e3/uL   HGB 12.6 11.6 - 15.9 g/dL   HCT 38.4 34.8 - 46.6 %   Platelets 88 (L) 145 - 400 10e3/uL   MCV 103.8 (H) 79.5 - 101.0 fL   MCH 34.1 (H) 25.1 -  34.0 pg   MCHC 32.8 31.5 - 36.0 g/dL   RBC 3.70 3.70 - 5.45 10e6/uL   RDW 14.3 11.2 - 14.5 %   lymph# 0.5 (L) 0.9 - 3.3 10e3/uL   MONO# 0.2 0.1 - 0.9 10e3/uL   Eosinophils Absolute 0.0 0.0 - 0.5 10e3/uL   Basophils Absolute 0.0 0.0 - 0.1 10e3/uL   NEUT% 65.7 38.4 - 76.8 %   LYMPH% 24.2 14.0 - 49.7 %   MONO% 8.6 0.0 - 14.0 %   EOS% 1.0 0.0 - 7.0 %   BASO% 0.5 0.0 - 2.0 %  Comprehensive metabolic panel  Result Value Ref Range   Sodium 138 136 - 145 mEq/L   Potassium 3.5 3.5 - 5.1 mEq/L   Chloride 102 98 - 109 mEq/L   CO2 25 22 - 29 mEq/L   Glucose 130 70 - 140 mg/dl   BUN 16.6 7.0 - 26.0 mg/dL   Creatinine 0.7 0.6 - 1.1 mg/dL   Total Bilirubin 0.47 0.20 - 1.20 mg/dL   Alkaline Phosphatase 128 40 - 150 U/L   AST 43 (H) 5 - 34 U/L   ALT 121 (H) 0 - 55 U/L   Total Protein 5.9 (L) 6.4 - 8.3 g/dL   Albumin 2.8 (L) 3.5 -  5.0 g/dL   Calcium 8.7 8.4 - 10.4 mg/dL   Anion Gap 10 3 - 11 mEq/L   EGFR >90 >90 ml/min/1.73 m2     Studies/Results: EXAM: CT ANGIOGRAPHY CHEST WITH CONTRAST 05-04-16  COMPARISON:  Chest x-ray 05/04/2016, CT 03/16/2016  FINDINGS: Cardiovascular: Satisfactory opacification of the pulmonary arteries to the segmental level. No evidence of pulmonary embolism. Normal heart size. No pericardial effusion. Vascular catheter tip at the proximal right atrium.  Mediastinum/Nodes: No enlarged mediastinal, hilar, or axillary lymph nodes. Thyroid gland, trachea, and esophagus demonstrate no significant findings.  Lungs/Pleura: Stable 5 mm mildly spiculated nodule in the right upper lobe, series 7, image number 21. Previously noted left perifissural nodule not as well appreciated on the current exam. Again visualized is diffuse septal thickening, right greater than left, findings have progressed bilaterally. Hazy attenuation is present within the upper lobes and bilateral lower lobes. There is partial atelectasis or consolidation in the right middle lobe  and left lower lobe.  Upper Abdomen: Diffuse decreased density of the liver, consistent with fatty changes. Poorly defined enhancing masses are present within the liver, suspected to be increased although evaluation limited due to arterial phase exam. Surgical clips in the gallbladder fossa.  Musculoskeletal: Again visualized is extensive skeletal metastatic disease of the spine and bilateral ribs, grossly similar compared to prior study.  Review of the MIP images confirms the above findings.  IMPRESSION: 1. No CT evidence for acute pulmonary embolus or aortic dissection. 2. Progression of diffuse bilateral septal thickening, right greater and than left. Concern previously raised for lymphangitic spread of tumor. Stable 5 mm spiculated nodule right upper lobe. Interim finding of partial atelectasis or consolidation in the right middle lobe and left lower lobe. 3. Multiple poorly visualized hepatic masses with fatty changes of the liver. Suspect that the masses have increased in size . 4. Extensive skeletal metastatic disease, grossly unchanged.  PACs images reviewed by MD  Medications: I have reviewed the patient's current medications. Continue decadron 2 mg q AM Will not resume keppra now Continue protonix daily Miralax, stool softener 1-2x daily as needed to keep bowels moving daily. Prescription for oxycontin 30 mg q 12 hrs rewritten now Diflucan prescription to have available if needed, to call to let us know if so After finishes present diflucan, she will use nystatin mouthwash 2-3x daily to try to maintain control of thrush while on decadron.  DISCUSSION  Meds as above.  Husband asking for details re CTA chest done in ED, tho patient does not want to hear this information. I did tell him that there is concern for lymphangitic tumor in lungs, tho clinically that is not obvious now. Patient and husband understand that she may need to increase decadron if any  recurrent symptoms concerning for progressive CNS effects.   We have reviewed today's CBC, platelets and ANC too low for planned #2 gemzar today. We will recheck and treat 11-17 if counts are better.  They understand that the chemo is in palliative attempt, and can be stopped at any point if not helpful or not tolerated. Hospice would be appropriate at any point if they can be of help, as long as chemo is no longer being used.  I did not address Palliative Care assistance at home, tho this might be of some help also.    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 She declined CSF evaluation. Whole brain radiation completed 04-10-16. Neurologic  symptoms improved with decadron now 2 mg q AM. Pamidronate given 04-16-16. First gemzar in palliative attempt given 04-24-16, planned every other week (patient's preference, rather than day 1 day 8 q 21 d) however counts too low for cycle 2 today. I will see her with counts 1 week and treat then if appropriate, dose reduction in gemzar from 1000 mg/m2 to 800 mg/m2 due to cytopenias.   2.DNR if irreversible complications, but wants to try gemzar or other reasonable interventions in palliative attempt. She will be hospice appropriate when treatment discontinued. 3.oral thrush from steroids: diflucan. WIll treat until cleared then try to maintain on mycelex or nystatin mouthwash while still on steroids. 4.PAC needed for gemzar and placed by IR on 04-22-16 5.GERD which has been symptomatic including nocturnal aspiration. Continue protonix bid at least until she is seen by new GI physician 6.Lymphedema LUE: stable  7.BRCA testing negative in 2008.  8..Root canal and dental work for broken tooth 02-24-16, healed without problems. Aaron Edelman resumed 03-19-16, pamidronate given in hospital 04-16-16 9.pain related to metastatic breast cancer: adequately controlled with present oxycontin and  prn oxycodone 10. Drug induced leukopenia/ neutropenia related to ibrance resolved off of that agent 11.post cholecystectomy. 12.needs flu vaccine   All questions answered and they know to call if needed prior to next visit. Gemzar orders adjusted. Time spent 25 min including >50% counseling and coordination of care. CC Dr [name]   Evlyn Clines, MD   05/08/2016, 8:13 PM

## 2016-05-08 NOTE — Telephone Encounter (Signed)
GAVE PATIENT AVS REPORT AND APPOINTMENTS FOR November.  °

## 2016-05-10 DIAGNOSIS — D6959 Other secondary thrombocytopenia: Secondary | ICD-10-CM | POA: Insufficient documentation

## 2016-05-10 DIAGNOSIS — D701 Agranulocytosis secondary to cancer chemotherapy: Secondary | ICD-10-CM | POA: Insufficient documentation

## 2016-05-10 DIAGNOSIS — T451X5A Adverse effect of antineoplastic and immunosuppressive drugs, initial encounter: Secondary | ICD-10-CM

## 2016-05-11 ENCOUNTER — Other Ambulatory Visit: Payer: Self-pay | Admitting: Oncology

## 2016-05-11 ENCOUNTER — Telehealth: Payer: Self-pay | Admitting: Oncology

## 2016-05-11 ENCOUNTER — Encounter: Payer: Self-pay | Admitting: Oncology

## 2016-05-11 DIAGNOSIS — Z1379 Encounter for other screening for genetic and chromosomal anomalies: Secondary | ICD-10-CM | POA: Insufficient documentation

## 2016-05-11 NOTE — Progress Notes (Signed)
GENETIC TEST RESULT  HPI: [name] was previously seen in the [facility] due to a personal history of metastatic breast cancer first diagnosed at age 50, family history of cancer, and concerns regarding a hereditary predisposition to cancer. Please refer to our prior cancer genetics clinic note from April 23, 2016 for more information regarding [name] medical, social and family histories, and our assessment and recommendations, at the time. [name] recent genetic test results were disclosed to her, as were recommendations warranted by these results. These results and recommendations are discussed in more detail below.  GENETIC TEST RESULTS: At the time of [name] visit on 04/23/16, Jill Johnston recommended she pursue genetic testing of the 28-gene myRisk Hereditary Cancer Panel through [employer]. The [facility] gene panel offered by [employer] includes sequencing and deletion/duplication testing of the following 28 genes: APC, ATM, BARD1, BMPR1A, BRCA1, BRCA2, BRIP1, CHD1, CDK4, CDKN2A, CHEK2, EPCAM (large rearrangement only), GREM1/SCG5, MLH1, MSH2, MSH6, MUTYH, NBN, PALB2, PMS2, POLD1, POLE, PTEN, RAD51C, RAD51D, SMAD4, STK11, and TP53. Those results are now back, the report date for which is May 04, 2016.  Genetic testing was normal, and did not reveal a deleterious mutation in these genes.  Additionally, no variants of uncertain significance (VUSes) were found.  The test report will be scanned into EPIC and will be located under the Results Review tab in the Pathology>Molecular Pathology section.   Jill Johnston discussed with [name] that since the current genetic testing is not perfect, it is possible there may be a gene mutation in one of these genes that current testing cannot detect, but that chance is small. Jill Johnston also discussed, that it is possible that another gene that has not yet been discovered, or that Jill Johnston have not yet tested,  is responsible for the cancer diagnoses in the family, and it is, therefore, important to remain in touch with cancer genetics in the future so that Jill Johnston can continue to offer [name] the most up-to-date genetic testing.    CANCER SCREENING RECOMMENDATIONS: While Jill Johnston still do not have an explanation for [name] personal history of breast cancer, this result is reassuring and indicates that [name] likely does not have an increased risk for a future cancer due to a mutation in one of these genes. This normal test also suggests that [name] cancer was most likely not due to an inherited predisposition associated with one of these genes.  Most cancers happen by chance and this negative test suggests that her cancer falls into this category.  Jill Johnston, therefore, recommended she continue to follow the cancer management and screening guidelines provided by her oncology and primary healthcare providers.   RECOMMENDATIONS FOR FAMILY MEMBERS: Women in this family might be at some increased risk of developing cancer, over the general population risk, simply due to the family history of cancer. Jill Johnston recommended women in this family have a yearly mammogram beginning at age 69, or 38 years younger than the earliest onset of cancer, an annual clinical breast exam, and perform monthly breast self-exams.  [name] half-sister should be getting annual mammograms now.  Women in this family should also have a gynecological exam as recommended by their primary provider. All family members should have a colonoscopy by age 70.  FOLLOW-UP: Lastly, Jill Johnston discussed with [name] that cancer genetics is a rapidly advancing field and it is possible that new genetic tests will be appropriate for her and/or her family members in the  future. Jill Johnston encouraged her to remain in contact with cancer genetics on an annual basis so Jill Johnston can update her personal and family histories and let her know of advances in cancer genetics that  may benefit this family.   Our contact number was provided. [name] questions were answered to her satisfaction, and she knows she is welcome to call us at anytime with additional questions or concerns.   Jeanine Luz, MS, [facility] Certified Genetic Counselor [email or web address] Phone: [phone]

## 2016-05-11 NOTE — Progress Notes (Signed)
Medical Oncology  Gemzar orders revised for second treatment, to 600 mg/ m2. Patient received 800 mg/m2 cycle 1, with delay in second treatment due to cytopenias (ANC and platelets)  L.Marko Plume, MD

## 2016-05-11 NOTE — Telephone Encounter (Signed)
sw pt to confirm provider change on 11/17 visit per LOS

## 2016-05-13 ENCOUNTER — Other Ambulatory Visit: Payer: Self-pay | Admitting: Oncology

## 2016-05-15 ENCOUNTER — Ambulatory Visit (HOSPITAL_BASED_OUTPATIENT_CLINIC_OR_DEPARTMENT_OTHER): Payer: BLUE CROSS/BLUE SHIELD

## 2016-05-15 ENCOUNTER — Other Ambulatory Visit (HOSPITAL_BASED_OUTPATIENT_CLINIC_OR_DEPARTMENT_OTHER): Payer: BLUE CROSS/BLUE SHIELD

## 2016-05-15 ENCOUNTER — Other Ambulatory Visit: Payer: Self-pay | Admitting: *Deleted

## 2016-05-15 ENCOUNTER — Ambulatory Visit: Payer: BLUE CROSS/BLUE SHIELD

## 2016-05-15 ENCOUNTER — Encounter: Payer: Self-pay | Admitting: General Practice

## 2016-05-15 ENCOUNTER — Ambulatory Visit (HOSPITAL_BASED_OUTPATIENT_CLINIC_OR_DEPARTMENT_OTHER): Payer: BLUE CROSS/BLUE SHIELD | Admitting: Oncology

## 2016-05-15 VITALS — BP 126/89 | HR 122 | Temp 97.5°F | Resp 18 | Ht 65.0 in | Wt 156.0 lb

## 2016-05-15 DIAGNOSIS — Z95828 Presence of other vascular implants and grafts: Secondary | ICD-10-CM

## 2016-05-15 DIAGNOSIS — C50212 Malignant neoplasm of upper-inner quadrant of left female breast: Secondary | ICD-10-CM

## 2016-05-15 DIAGNOSIS — C7951 Secondary malignant neoplasm of bone: Secondary | ICD-10-CM

## 2016-05-15 DIAGNOSIS — C50912 Malignant neoplasm of unspecified site of left female breast: Secondary | ICD-10-CM

## 2016-05-15 DIAGNOSIS — C787 Secondary malignant neoplasm of liver and intrahepatic bile duct: Secondary | ICD-10-CM

## 2016-05-15 DIAGNOSIS — C7931 Secondary malignant neoplasm of brain: Secondary | ICD-10-CM

## 2016-05-15 DIAGNOSIS — C7949 Secondary malignant neoplasm of other parts of nervous system: Secondary | ICD-10-CM

## 2016-05-15 DIAGNOSIS — C7800 Secondary malignant neoplasm of unspecified lung: Secondary | ICD-10-CM

## 2016-05-15 DIAGNOSIS — C7802 Secondary malignant neoplasm of left lung: Principal | ICD-10-CM

## 2016-05-15 DIAGNOSIS — Z5111 Encounter for antineoplastic chemotherapy: Secondary | ICD-10-CM

## 2016-05-15 DIAGNOSIS — Z170 Estrogen receptor positive status [ER+]: Secondary | ICD-10-CM

## 2016-05-15 LAB — COMPREHENSIVE METABOLIC PANEL
ALBUMIN: 2.6 g/dL — AB (ref 3.5–5.0)
ALK PHOS: 196 U/L — AB (ref 40–150)
ALT: 213 U/L — AB (ref 0–55)
ANION GAP: 8 meq/L (ref 3–11)
AST: 89 U/L — AB (ref 5–34)
BILIRUBIN TOTAL: 0.65 mg/dL (ref 0.20–1.20)
BUN: 11.2 mg/dL (ref 7.0–26.0)
CALCIUM: 9 mg/dL (ref 8.4–10.4)
CO2: 25 mEq/L (ref 22–29)
CREATININE: 0.7 mg/dL (ref 0.6–1.1)
Chloride: 105 mEq/L (ref 98–109)
EGFR: >90 mL/min/{1.73_m2} (ref >90)
Glucose: 138 mg/dl (ref 70–140)
Potassium: 3.9 mEq/L (ref 3.5–5.1)
Sodium: 138 mEq/L (ref 136–145)
TOTAL PROTEIN: 5.9 g/dL — AB (ref 6.4–8.3)

## 2016-05-15 LAB — CBC WITH DIFFERENTIAL/PLATELET
BASO%: 1.2 % (ref 0.0–2.0)
Basophils Absolute: 0 10*3/uL (ref 0.0–0.1)
EOS%: 1.2 % (ref 0.0–7.0)
Eosinophils Absolute: 0 10*3/uL (ref 0.0–0.5)
HEMATOCRIT: 34.3 % — AB (ref 34.8–46.6)
HEMOGLOBIN: 10.9 g/dL — AB (ref 11.6–15.9)
LYMPH#: 0.7 10*3/uL — AB (ref 0.9–3.3)
LYMPH%: 29.5 % (ref 14.0–49.7)
MCH: 33.3 pg (ref 25.1–34.0)
MCHC: 31.8 g/dL (ref 31.5–36.0)
MCV: 104.9 fL — ABNORMAL HIGH (ref 79.5–101.0)
MONO#: 0.3 10*3/uL (ref 0.1–0.9)
MONO%: 11.9 % (ref 0.0–14.0)
NEUT%: 56.2 % (ref 38.4–76.8)
NEUTROS ABS: 1.4 10*3/uL — AB (ref 1.5–6.5)
PLATELETS: 107 10*3/uL — AB (ref 145–400)
RBC: 3.27 10*6/uL — ABNORMAL LOW (ref 3.70–5.45)
RDW: 14.5 % (ref 11.2–14.5)
WBC: 2.4 10*3/uL — ABNORMAL LOW (ref 3.9–10.3)

## 2016-05-15 LAB — TECHNOLOGIST REVIEW

## 2016-05-15 MED ORDER — PROCHLORPERAZINE MALEATE 10 MG PO TABS
10.0000 mg | ORAL_TABLET | Freq: Once | ORAL | Status: AC
  Administered 2016-05-15: 10 mg via ORAL

## 2016-05-15 MED ORDER — SODIUM CHLORIDE 0.9 % IV SOLN
Freq: Once | INTRAVENOUS | Status: AC
  Administered 2016-05-15: 11:00:00 via INTRAVENOUS

## 2016-05-15 MED ORDER — HEPARIN SOD (PORK) LOCK FLUSH 100 UNIT/ML IV SOLN
500.0000 [IU] | Freq: Once | INTRAVENOUS | Status: AC | PRN
  Administered 2016-05-15: 500 [IU]
  Filled 2016-05-15: qty 5

## 2016-05-15 MED ORDER — SODIUM CHLORIDE 0.9 % IV SOLN
600.0000 mg/m2 | Freq: Once | INTRAVENOUS | Status: AC
  Administered 2016-05-15: 1064 mg via INTRAVENOUS
  Filled 2016-05-15: qty 27.98

## 2016-05-15 MED ORDER — DENOSUMAB 120 MG/1.7ML ~~LOC~~ SOLN
120.0000 mg | Freq: Once | SUBCUTANEOUS | Status: AC
  Administered 2016-05-15: 120 mg via SUBCUTANEOUS
  Filled 2016-05-15: qty 1.7

## 2016-05-15 MED ORDER — SODIUM CHLORIDE 0.9% FLUSH
10.0000 mL | INTRAVENOUS | Status: DC | PRN
  Administered 2016-05-15: 10 mL via INTRAVENOUS
  Filled 2016-05-15: qty 10

## 2016-05-15 MED ORDER — ACETAMINOPHEN 325 MG PO TABS
ORAL_TABLET | ORAL | Status: AC
  Filled 2016-05-15: qty 1

## 2016-05-15 MED ORDER — ACETAMINOPHEN 325 MG PO TABS
325.0000 mg | ORAL_TABLET | Freq: Once | ORAL | Status: AC
  Administered 2016-05-15: 325 mg via ORAL

## 2016-05-15 MED ORDER — SODIUM CHLORIDE 0.9% FLUSH
10.0000 mL | INTRAVENOUS | Status: DC | PRN
  Administered 2016-05-15: 10 mL
  Filled 2016-05-15: qty 10

## 2016-05-15 MED ORDER — PROCHLORPERAZINE MALEATE 10 MG PO TABS
ORAL_TABLET | ORAL | Status: AC
  Filled 2016-05-15: qty 1

## 2016-05-15 NOTE — Progress Notes (Signed)
[place] Spiritual Care Note  Attempted f/u connection with Jill Johnston in infusion per referral from Dr Marko Plume, but she appears to prefer privacy and sharing with people she already knows and trusts.  Will consult with Dr Marko Plume about ways to provide support resources behind the scenes (such as through MD, RN or other familiar members of pt's team).   [place], [place], [facility] Pager [phone] Voicemail [phone]

## 2016-05-15 NOTE — Patient Instructions (Addendum)
[facility] Discharge Instructions for Patients Receiving Chemotherapy  Today you received the following chemotherapy agents: Gemzar, Xgeva    To help prevent nausea and vomiting after your treatment, we encourage you to take your nausea medication as prescribed.   If you develop nausea and vomiting that is not controlled by your nausea medication, call the clinic.   BELOW ARE SYMPTOMS THAT SHOULD BE REPORTED IMMEDIATELY:  *FEVER GREATER THAN 100.5 F  *CHILLS WITH OR WITHOUT FEVER  NAUSEA AND VOMITING THAT IS NOT CONTROLLED WITH YOUR NAUSEA MEDICATION  *UNUSUAL SHORTNESS OF BREATH  *UNUSUAL BRUISING OR BLEEDING  TENDERNESS IN MOUTH AND THROAT WITH OR WITHOUT PRESENCE OF ULCERS  *URINARY PROBLEMS  *BOWEL PROBLEMS  UNUSUAL RASH Items with * indicate a potential emergency and should be followed up as soon as possible.  Feel free to call the clinic you have any questions or concerns. The clinic phone number is [phone].  Please show the [facility] at check-in to the Emergency Department and triage nurse.  Denosumab injection What is this medicine? DENOSUMAB (den oh sue mab) slows bone breakdown. Prolia is used to treat osteoporosis in women after menopause and in men. Delton See is used to prevent bone fractures and other bone problems caused by cancer bone metastases. Delton See is also used to treat giant cell tumor of the bone. COMMON BRAND NAME(S): Prolia, XGEVA What should I tell my health care provider before I take this medicine? They need to know if you have any of these conditions: -dental disease -eczema -infection or history of infections -kidney disease or on dialysis -low blood calcium or vitamin D -malabsorption syndrome -scheduled to have surgery or tooth extraction -taking medicine that contains denosumab -thyroid or parathyroid disease -an unusual reaction to denosumab, other medicines, foods, dyes, or preservatives -pregnant or trying to  get pregnant -breast-feeding How should I use this medicine? This medicine is for injection under the skin. It is given by a health care professional in a hospital or clinic setting. If you are getting Prolia, a special MedGuide will be given to you by the pharmacist with each prescription and refill. Be sure to read this information carefully each time. For Prolia, talk to your pediatrician regarding the use of this medicine in children. Special care may be needed. For Delton See, talk to your pediatrician regarding the use of this medicine in children. While this drug may be prescribed for children as young as 13 years for selected conditions, precautions do apply. What if I miss a dose? It is important not to miss your dose. Call your doctor or health care professional if you are unable to keep an appointment. What may interact with this medicine? Do not take this medicine with any of the following medications: -other medicines containing denosumab This medicine may also interact with the following medications: -medicines that suppress the immune system -medicines that treat cancer -steroid medicines like prednisone or cortisone What should I watch for while using this medicine? Visit your doctor or health care professional for regular checks on your progress. Your doctor or health care professional may order blood tests and other tests to see how you are doing. Call your doctor or health care professional if you get a cold or other infection while receiving this medicine. Do not treat yourself. This medicine may decrease your body's ability to fight infection. You should make sure you get enough calcium and vitamin D while you are taking this medicine, unless your doctor tells you  not to. Discuss the foods you eat and the vitamins you take with your health care professional. See your dentist regularly. Brush and floss your teeth as directed. Before you have any dental work done, tell your dentist  you are receiving this medicine. Do not become pregnant while taking this medicine or for 5 months after stopping it. Women should inform their doctor if they wish to become pregnant or think they might be pregnant. There is a potential for serious side effects to an unborn child. Talk to your health care professional or pharmacist for more information. What side effects may I notice from receiving this medicine? Side effects that you should report to your doctor or health care professional as soon as possible: -allergic reactions like skin rash, itching or hives, swelling of the face, lips, or tongue -breathing problems -chest pain -fast, irregular heartbeat -feeling faint or lightheaded, falls -fever, chills, or any other sign of infection -muscle spasms, tightening, or twitches -numbness or tingling -skin blisters or bumps, or is dry, peels, or red -slow healing or unexplained pain in the mouth or jaw -unusual bleeding or bruising Side effects that usually do not require medical attention (report to your doctor or health care professional if they continue or are bothersome): -muscle pain -stomach upset, gas Where should I keep my medicine? This medicine is only given in a clinic, doctor's office, or other health care setting and will not be stored at home.  2017 [employer]/Gold Standard (2015-07-18 10:06:55)   Gemcitabine injection What is this medicine? GEMCITABINE (jem SIT a been) is a chemotherapy drug. This medicine is used to treat many types of cancer like breast cancer, lung cancer, pancreatic cancer, and ovarian cancer. This medicine may be used for other purposes; ask your health care provider or pharmacist if you have questions. COMMON BRAND NAME(S): Gemzar What should I tell my health care provider before I take this medicine? They need to know if you have any of these conditions: -blood disorders -infection -kidney disease -liver disease -recent or ongoing radiation  therapy -an unusual or allergic reaction to gemcitabine, other chemotherapy, other medicines, foods, dyes, or preservatives -pregnant or trying to get pregnant -breast-feeding How should I use this medicine? This drug is given as an infusion into a vein. It is administered in a hospital or clinic by a specially trained health care professional. Talk to your pediatrician regarding the use of this medicine in children. Special care may be needed. Overdosage: If you think you have taken too much of this medicine contact a poison control center or emergency room at once. NOTE: This medicine is only for you. Do not share this medicine with others. What if I miss a dose? It is important not to miss your dose. Call your doctor or health care professional if you are unable to keep an appointment. What may interact with this medicine? -medicines to increase blood counts like filgrastim, pegfilgrastim, sargramostim -some other chemotherapy drugs like cisplatin -vaccines Talk to your doctor or health care professional before taking any of these medicines: -acetaminophen -aspirin -ibuprofen -ketoprofen -naproxen This list may not describe all possible interactions. Give your health care provider a list of all the medicines, herbs, non-prescription drugs, or dietary supplements you use. Also tell them if you smoke, drink alcohol, or use illegal drugs. Some items may interact with your medicine. What should I watch for while using this medicine? Visit your doctor for checks on your progress. This drug may make you feel generally unwell. This  is not uncommon, as chemotherapy can affect healthy cells as well as cancer cells. Report any side effects. Continue your course of treatment even though you feel ill unless your doctor tells you to stop. In some cases, you may be given additional medicines to help with side effects. Follow all directions for their use. Call your doctor or health care professional for  advice if you get a fever, chills or sore throat, or other symptoms of a cold or flu. Do not treat yourself. This drug decreases your body's ability to fight infections. Try to avoid being around people who are sick. This medicine may increase your risk to bruise or bleed. Call your doctor or health care professional if you notice any unusual bleeding. Be careful brushing and flossing your teeth or using a toothpick because you may get an infection or bleed more easily. If you have any dental work done, tell your dentist you are receiving this medicine. Avoid taking products that contain aspirin, acetaminophen, ibuprofen, naproxen, or ketoprofen unless instructed by your doctor. These medicines may hide a fever. Women should inform their doctor if they wish to become pregnant or think they might be pregnant. There is a potential for serious side effects to an unborn child. Talk to your health care professional or pharmacist for more information. Do not breast-feed an infant while taking this medicine. What side effects may I notice from receiving this medicine? Side effects that you should report to your doctor or health care professional as soon as possible: -allergic reactions like skin rash, itching or hives, swelling of the face, lips, or tongue -low blood counts - this medicine may decrease the number of white blood cells, red blood cells and platelets. You may be at increased risk for infections and bleeding. -signs of infection - fever or chills, cough, sore throat, pain or difficulty passing urine -signs of decreased platelets or bleeding - bruising, pinpoint red spots on the skin, black, tarry stools, blood in the urine -signs of decreased red blood cells - unusually weak or tired, fainting spells, lightheadedness -breathing problems -chest pain -mouth sores -nausea and vomiting -pain, swelling, redness at site where injected -pain, tingling, numbness in the hands or feet -stomach  pain -swelling of ankles, feet, hands -unusual bleeding Side effects that usually do not require medical attention (report to your doctor or health care professional if they continue or are bothersome): -constipation -diarrhea -hair loss -loss of appetite -stomach upset This list may not describe all possible side effects. Call your doctor for medical advice about side effects. You may report side effects to FDA at 1-800-FDA-1088. Where should I keep my medicine? This drug is given in a hospital or clinic and will not be stored at home. NOTE: This sheet is a summary. It may not cover all possible information. If you have questions about this medicine, talk to your doctor, pharmacist, or health care provider.  2017 [employer]/Gold Standard (2007-10-25 18:45:54)

## 2016-05-15 NOTE — Progress Notes (Signed)
Per Dr. Jana Hakim OK to proceed with treatment today with ANC 1.4, AST 89, and ALT 213

## 2016-05-15 NOTE — Progress Notes (Signed)
[place]  [phone] [phone]) [phone]     ID: Jill Johnston DOB: [birth year]  MR#: [id number]  HER#:[id number]  Patient Care Team: [name], MD as PCP - General (Internal Medicine) [name], MD as Consulting Physician (Radiation Oncology) [name] as Consulting Physician (Internal Medicine) [name], MD OTHER MD:  CHIEF COMPLAINT: Estrogen receptor positive metastatic breast cancer  CURRENT TREATMENT: Gemcitabine   BREAST CANCER HISTORY: From Dr. 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 The breast cancer was ER + 100%, PR 1%, HER 2 IHC/FISH negative, Ki67 52%, BRCA 1/2 negative, initial CA 2729 WNL. She had neoadjuvant AC x4 taxol x12 dose dense from 06-10-2007 thru 09-28-2007 by Dr B.Darovsky. 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 She had radiation to chest wall, supraclavicular region and left axilla 50.4 Gy with boost to mastectomy scar to 60.4 Gy, by Dr [name] from 12-15-2007 thru 01-31-2008. She was on tamoxifen from 02-20-2008 thru 09-25-2014. She developed pain in ribs 08-2014, with CA 2729 52 and PET CT and MRI with diffuse bone metastatic disease, no cord compression, small lung lesions and intrathoracic adenopathy. 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 She began Denosumab 120 mcg monthly starting 4-7-201, and letrozole + palbociclib beginning4-12-2014. She was seen in consultation by Dr [name] [phone]. Palbociclib dose decreased to 100 mg daily x 21 q 28 days due to neutropenia. CBC on 10-25-15: WBC 2.1, Hgb 13, plt 186, ANC 0.9 PET 09-25-2014 in [facility] and CT CAP at  [place] 10-25-15 with no clear pulmonary involvement, stable 2 cm lesion at dome of liver and 11 mm left hepatic lobe lesion. She had right tomo mammogram at [facility], [facility] 09-17-15, with heterogeneously dense breast tissue but no other mammographic findings of concern.  CA 2729 increased from 38 in 08-2015 to 49 in April 2017 and 65 in May 2017. CT CAP / PET 03-16-16 showed progression lung, liver and bone. Letrozole and ibrance DCd on 03-19-16, with plans to begin chemotherapy. 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 She elected whole brain RT, given by Dr [name] ~ 03-23-16 thru 04-10-16.  She had first gemzar on 04-24-16  She had evaluation for vaginal bleeding "from polyp", follows yearly with gynecologist in [place], up to date."  Her subsequent history is as detailed below.  INTERVAL HISTORY: Jill Johnston was evaluated in the breast clinic 05/15/2016 accompanied by her husband Jill Johnston. Today is day 15 cycle 1 of Gemzar, which she was scheduled to receive day one and day 8. However the day 8 treatment had to be delayed because of thrombocytopenia. The neutrophil count was also borderline.   REVIEW OF SYSTEMS: Isatu tolerated the first gemcitabine dose without significant side effects. She does describe herself as severely fatigued. She continues to have stabbing pains mostly in her ribs, but these are very intermittent. She is short of breath even at rest. She denies a cough. She has heartburn problems. She is now off steroids and hoping her "moon face" we'll continue to resolve. She has had mild constipation problems which she is taking care of with Colace.  Currently she denies unusual headaches, visual changes, nausea, vomiting, dizziness, or gait imbalance. A detailed review of systems today was otherwise noncontributory  PAST MEDICAL HISTORY: Past Medical History:  Diagnosis Date  .  Anxiety   . 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 Breast cancer metastasized to multiple sites ([place]) 04/12/2016   bone, liver, lung and brain  . GERD (gastroesophageal reflux disease)   . Hypertension     PAST SURGICAL HISTORY: Past Surgical History:  Procedure Laterality Date  . CHOLECYSTECTOMY    . ESOPHAGOGASTRODUODENOSCOPY (EGD) WITH PROPOFOL N/A 07/12/2015   Procedure: ESOPHAGOGASTRODUODENOSCOPY (EGD) WITH PROPOFOL;  Surgeon: Rogene [place], MD;  Location: [facility];  Service: Endoscopy;  Laterality: N/A;  1:00  . IR GENERIC HISTORICAL  04/22/2016   IR US GUIDE VASC ACCESS RIGHT 04/22/2016 Markus Daft, MD [facility]-INTER[facility] RAD  . IR GENERIC HISTORICAL  04/22/2016   IR FLUORO GUIDE PORT INSERTION RIGHT 04/22/2016 Markus Daft, MD [facility]  . MASTECTOMY  April 2009   Left breast with lymph node resection    FAMILY HISTORY Family History  Problem Relation Age of Onset  . Heart attack Mother     Died age 61  . Heart disease Father     Died age 23  . Brain cancer Father     pt not sure if he actually had this  . Cancer Maternal Grandmother     dx. NOS cancer at older age; d. 49  . Heart attack Maternal Grandfather 79  . Colon cancer Paternal Grandmother     d. 67s  The patient's father died at age 33 she believes from heart disease. The patient's mother died at age 74 also from a myocardial infarction. The patient had one brother, no sisters. There is no history of breast or ovarian cancer in the family despite extensive G neurologic research   GYNECOLOGIC HISTORY:  No LMP recorded. Patient is not currently having periods (Reason: Chemotherapy).  menarche age 27, first live birth age 54, the patient is GX P1. She stopped having periods with chemotherapy in 2009 and these have not resumed.   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 They recently moved to [place] from [place]. Their son [name] is 52 years old as of November 2017 . The patient is [place] Orthodox      ADVANCED DIRECTIVES:  the patient's husband is her healthcare power of attorney. There is no living will in place    HEALTH MAINTENANCE: Social History  Substance Use Topics  . Smoking status: Never Smoker  . Smokeless tobacco: Never Used  . Alcohol use No     Colonoscopy:  PAP:  Bone density:   No Known Allergies  Current Outpatient Prescriptions  Medication Sig Dispense Refill  . docusate sodium (COLACE) 100 MG capsule Take 100 mg by mouth 2 (two) times daily.    . ondansetron (ZOFRAN) 8 MG tablet Take 1 tablet (8 mg total) by mouth every 8 (eight) hours as needed for nausea or vomiting. 30 tablet 0  . chlorhexidine (PERIDEX) 0.12 % solution Use as directed 15 mLs in the mouth or throat 3 (three) times daily. 1893 mL 0  . diphenhydrAMINE (BENADRYL) 25 mg capsule Take 25 mg by mouth at bedtime as needed.    . fluconazole (DIFLUCAN) 100 MG  tablet Take 1 tablet (100 mg total) by mouth daily as needed (for thrush). 10 tablet 0  . lidocaine-prilocaine (EMLA) cream Apply 1 application topically as needed. To port 1 hour before going to be accessed with needle. Cover with plastic wrap. 30 g 1  . lisinopril (PRINIVIL,ZESTRIL) 10 MG tablet Take 1 tablet (10 mg total) by mouth daily. 30 tablet 0  . nystatin (MYCOSTATIN) 100000 UNIT/ML suspension Take 5 mLs (500,000 Units total) by mouth 4 (four) times daily. 60 mL 0  . oxyCODONE (OXYCONTIN) 30 MG 12 hr tablet Take 30 mg by mouth 2 (two) times daily. 60 each 0  . Oxycodone HCl 10 MG TABS Take 1 tablet by mouth every 3 (three) hours as needed for pain.    . pantoprazole (PROTONIX) 40 MG tablet Take 1 tablet (40 mg total) by mouth 2 (two) times daily before a meal. 60 tablet 3  . polyethylene glycol (MIRALAX / GLYCOLAX) packet Take 17 grams by mouth every day.     No  current facility-administered medications for this visit.     OBJECTIVE: Middle-aged white woman who appears stated age  13:   05/15/16 0958  BP: 126/89  Pulse: (!) 122  Resp: 18  Temp: 97.5 F (36.4 C)     Body mass index is 25.96 kg/m.    ECOG FS:1 - Symptomatic but completely ambulatory  Expected fat redistribution from recent steroid therapy Ocular: Sclerae unicteric, pupils equal, round and equal Ear-nose-throat: Oropharynx clear and moist Lymphatic: No cervical or supraclavicular adenopathy Lungs no rales or rhonchi, no wheezes appreciated Heart regular rate and rhythm, no murmur appreciated Abd soft, nontender, positive bowel sounds MSK no focal spinal tenderness, no joint edema Neuro: non-focal, well-oriented, appropriate affect Breasts: The right breast shows no masses. The left breast is status post mastectomy. There is no evidence of local recurrence. The left axilla is benign.   LAB RESULTS:  CMP     Component Value Date/Time   NA 138 05/15/2016 0933   K 3.9 05/15/2016 0933   CL 100 (L) 05/04/2016 1304   CO2 25 05/15/2016 0933   GLUCOSE 138 05/15/2016 0933   BUN 11.2 05/15/2016 0933   CREATININE 0.7 05/15/2016 0933   CALCIUM 9.0 05/15/2016 0933   PROT 5.9 (L) 05/15/2016 0933   ALBUMIN 2.6 (L) 05/15/2016 0933   AST 89 (H) 05/15/2016 0933   ALT 213 (H) 05/15/2016 0933   ALKPHOS 196 (H) 05/15/2016 0933   BILITOT 0.65 05/15/2016 0933   GFRNONAA >60 05/04/2016 1304   GFRAA >60 05/04/2016 1304    INo results found for: SPEP, UPEP  Lab Results  Component Value Date   WBC 2.4 (L) 05/15/2016   NEUTROABS 1.4 (L) 05/15/2016   HGB 10.9 (L) 05/15/2016   HCT 34.3 (L) 05/15/2016   MCV 104.9 (H) 05/15/2016   PLT 107 (L) 05/15/2016      Chemistry      Component Value Date/Time   NA 138 05/15/2016 0933   K 3.9 05/15/2016 0933   CL 100 (L) 05/04/2016 1304   CO2 25 05/15/2016 0933   BUN 11.2 05/15/2016 0933   CREATININE 0.7 05/15/2016 0933        Component Value Date/Time   CALCIUM 9.0 05/15/2016 0933   ALKPHOS 196 (H) 05/15/2016 0933   AST 89 (H) 05/15/2016 0933   ALT 213 (H) 05/15/2016 0933   BILITOT 0.65 05/15/2016 0933       Lab Results  Component Value Date   LABCA2  98 (H) 02/27/2016    No components found for: LPFXT024  No results for input(s): INR in the last 168 hours.  Urinalysis    Component Value Date/Time   COLORURINE YELLOW 04/15/2016 1943   APPEARANCEUR CLEAR 04/15/2016 1943   LABSPEC 1.015 04/15/2016 1943   PHURINE 6.0 04/15/2016 1943   GLUCOSEU NEGATIVE 04/15/2016 [place] 04/15/2016 [place] 04/15/2016 [place] NEGATIVE 04/15/2016 1943   PROTEINUR 30 (A) 04/15/2016 1943   NITRITE NEGATIVE 04/15/2016 1943   LEUKOCYTESUR NEGATIVE 04/15/2016 1943     STUDIES: Dg Chest 2 View  Result Date: 05/04/2016 CLINICAL DATA:  Several hours of shortness of breath. Difficulty swallowing. Stage IV breast malignancy metastatic to the bones currently on chemo radiation. History of previous left mastectomy. EXAM: CHEST  2 VIEW COMPARISON:  Chest CT scan dated March 16, 2016 and PA and lateral chest x-ray of August 08, 2014. FINDINGS: The lungs are mildly hypo inflated. Subtle increased density is noted at both lung bases. There is no pleural effusion or pneumothorax. The heart and pulmonary vascularity are normal. The mediastinum is normal in width. The power port catheter tip projects over the midportion of the SVC. The observed portions of the upper abdomen are normal. The left breast shadow is absent. The bony structures exhibit no acute abnormalities. IMPRESSION: Bibasilar atelectasis or infiltrate. The possibility of lymphatic spread of malignancy was raised on the previous CT scan. No CHF. No pulmonary parenchymal masses are observed. If the patient's symptoms might be related to pneumonia, follow-up PA and lateral chest X-ray would be recommended in 3-4 weeks following  trial of antibiotic therapy to ensure resolution. Recommended in 3-4 weeks following trial of antibiotic therapy to ensure resolution. If lymphatic spread of malignancy is felt be most likely, chest CT scanning now would be recommended. Electronically Signed   By: David  Martinique M.D.   On: 05/04/2016 12:47   Ct Angio Chest Pe W And/or Wo Contrast  Result Date: 05/04/2016 CLINICAL DATA:  Cough with shortness of breath history of metastatic breast cancer to bone liver lung and brain EXAM: CT ANGIOGRAPHY CHEST WITH CONTRAST TECHNIQUE: Multidetector CT imaging of the chest was performed using the standard protocol during bolus administration of intravenous contrast. Multiplanar CT image reconstructions and MIPs were obtained to evaluate the vascular anatomy. CONTRAST:  66 mL Isovue 370 intravenous COMPARISON:  Chest x-ray 05/04/2016, CT 03/16/2016 FINDINGS: Cardiovascular: Satisfactory opacification of the pulmonary arteries to the segmental level. No evidence of pulmonary embolism. Normal heart size. No pericardial effusion. Vascular catheter tip at the proximal right atrium. Mediastinum/Nodes: No enlarged mediastinal, hilar, or axillary lymph nodes. Thyroid gland, trachea, and esophagus demonstrate no significant findings. Lungs/Pleura: Stable 5 mm mildly spiculated nodule in the right upper lobe, series 7, image number 21. Previously noted left perifissural nodule not as well appreciated on the current exam. Again visualized is diffuse septal thickening, right greater than left, findings have progressed bilaterally. Hazy attenuation is present within the upper lobes and bilateral lower lobes. There is partial atelectasis or consolidation in the right middle lobe and left lower lobe. Upper Abdomen: Diffuse decreased density of the liver, consistent with fatty changes. Poorly defined enhancing masses are present within the liver, suspected to be increased although evaluation limited due to arterial phase exam.  Surgical clips in the gallbladder fossa. Musculoskeletal: Again visualized is extensive skeletal metastatic disease of the spine and bilateral ribs, grossly similar compared to prior study. Review of the  MIP images confirms the above findings. IMPRESSION: 1. No CT evidence for acute pulmonary embolus or aortic dissection. 2. Progression of diffuse bilateral septal thickening, right greater and than left. Concern previously raised for lymphangitic spread of tumor. Stable 5 mm spiculated nodule right upper lobe. Interim finding of partial atelectasis or consolidation in the right middle lobe and left lower lobe. 3. Multiple poorly visualized hepatic masses with fatty changes of the liver. Suspect that the masses have increased in size . 4. Extensive skeletal metastatic disease, grossly unchanged. Electronically Signed   By: Donavan Foil M.D.   On: 05/04/2016 14:39   Mr Cervical Spine W Or Wo Contrast  Result Date: 04/16/2016 CLINICAL DATA:  Numbness involving the whole body. History of metastatic breast cancer. EXAM: MRI TOTAL SPINE WITHOUT AND WITH CONTRAST TECHNIQUE: Multisequence MR imaging of the spine from the cervical spine to the sacrum was performed prior to and following IV contrast administration for evaluation of spinal metastatic disease. CONTRAST:  18m MULTIHANCE GADOBENATE DIMEGLUMINE 529 MG/ML IV SOLN COMPARISON:  10/03/2014 thoracic spine MRI FINDINGS: MRI CERVICAL SPINE FINDINGS Alignment: Normal Vertebrae: Every cervical vertebra has infiltrating marrow lesion consistent with metastatic disease. Metastasis also seen in the inferior clivus. Thoracic spine disease reported below. There is a C3 superior endplate fracture that is chronic based on lack of marrow edema. There is no noted epidural or foraminal infiltration. Cord: Normal signal and morphology. Posterior Fossa, vertebral arteries, paraspinal tissues: Negative. Disc levels:  No notable degenerative change or impingement. MRI THORACIC  SPINE FINDINGS Alignment:  Normal Vertebrae: Progressively confluent metastases throughout all the thoracic vertebrae. There are pathologic fractures involving the T3 superior endplate, T6 inferior endplate, T11 inferior endplate, and T12 inferior endplate. These have progressed from prior but show no marrow edema to suggest unhealed fracture. Tumor infiltration into the epidural space described previously is not confidently seen today. Cord:  Normal signal and morphology. Paraspinal and other soft tissues: Known liver metastases. Diffuse rib metastases. Disc levels: No degenerative impingement MRI LUMBAR SPINE FINDINGS Segmentation:  Standard. Alignment:  Physiologic. Vertebrae: Progressively confluent metastases at each level with L2, L3, L5, and S1 superior endplate fractures that have a chronic appearance. No visible epidural or foraminal infiltration. Conus medullaris: Extends to the L1-2 disc level and appears normal. No abnormal cauda equina enhancement Paraspinal and other soft tissues: Sacral and ileal metastases. Disc levels: No degenerative stenosis or impingement. IMPRESSION: 1. Widespread osseous metastases, present at every spinal level, progressed from 2016 MRI. No noted epidural or foraminal infiltration. No evidence of intrathecal metastasis. 2. Numerous chronic endplate fractures are described above. No acute fracture noted. Electronically Signed   By: JMonte FantasiaM.D.   On: 04/16/2016 15:58   Mr Thoracic Spine W Wo Contrast  Result Date: 04/16/2016 CLINICAL DATA:  Numbness involving the whole body. History of metastatic breast cancer. EXAM: MRI TOTAL SPINE WITHOUT AND WITH CONTRAST TECHNIQUE: Multisequence MR imaging of the spine from the cervical spine to the sacrum was performed prior to and following IV contrast administration for evaluation of spinal metastatic disease. CONTRAST:  115mMULTIHANCE GADOBENATE DIMEGLUMINE 529 MG/ML IV SOLN COMPARISON:  10/03/2014 thoracic spine MRI  FINDINGS: MRI CERVICAL SPINE FINDINGS Alignment: Normal Vertebrae: Every cervical vertebra has infiltrating marrow lesion consistent with metastatic disease. Metastasis also seen in the inferior clivus. Thoracic spine disease reported below. There is a C3 superior endplate fracture that is chronic based on lack of marrow edema. There is no noted epidural or foraminal infiltration. Cord: Normal  signal and morphology. Posterior Fossa, vertebral arteries, paraspinal tissues: Negative. Disc levels:  No notable degenerative change or impingement. MRI THORACIC SPINE FINDINGS Alignment:  Normal Vertebrae: Progressively confluent metastases throughout all the thoracic vertebrae. There are pathologic fractures involving the T3 superior endplate, T6 inferior endplate, T11 inferior endplate, and T12 inferior endplate. These have progressed from prior but show no marrow edema to suggest unhealed fracture. Tumor infiltration into the epidural space described previously is not confidently seen today. Cord:  Normal signal and morphology. Paraspinal and other soft tissues: Known liver metastases. Diffuse rib metastases. Disc levels: No degenerative impingement MRI LUMBAR SPINE FINDINGS Segmentation:  Standard. Alignment:  Physiologic. Vertebrae: Progressively confluent metastases at each level with L2, L3, L5, and S1 superior endplate fractures that have a chronic appearance. No visible epidural or foraminal infiltration. Conus medullaris: Extends to the L1-2 disc level and appears normal. No abnormal cauda equina enhancement Paraspinal and other soft tissues: Sacral and ileal metastases. Disc levels: No degenerative stenosis or impingement. IMPRESSION: 1. Widespread osseous metastases, present at every spinal level, progressed from 2016 MRI. No noted epidural or foraminal infiltration. No evidence of intrathecal metastasis. 2. Numerous chronic endplate fractures are described above. No acute fracture noted. Electronically Signed    By: Monte Fantasia M.D.   On: 04/16/2016 15:58   Mr Lumbar Spine W Wo Contrast  Result Date: 04/16/2016 CLINICAL DATA:  Numbness involving the whole body. History of metastatic breast cancer. EXAM: MRI TOTAL SPINE WITHOUT AND WITH CONTRAST TECHNIQUE: Multisequence MR imaging of the spine from the cervical spine to the sacrum was performed prior to and following IV contrast administration for evaluation of spinal metastatic disease. CONTRAST:  13m MULTIHANCE GADOBENATE DIMEGLUMINE 529 MG/ML IV SOLN COMPARISON:  10/03/2014 thoracic spine MRI FINDINGS: MRI CERVICAL SPINE FINDINGS Alignment: Normal Vertebrae: Every cervical vertebra has infiltrating marrow lesion consistent with metastatic disease. Metastasis also seen in the inferior clivus. Thoracic spine disease reported below. There is a C3 superior endplate fracture that is chronic based on lack of marrow edema. There is no noted epidural or foraminal infiltration. Cord: Normal signal and morphology. Posterior Fossa, vertebral arteries, paraspinal tissues: Negative. Disc levels:  No notable degenerative change or impingement. MRI THORACIC SPINE FINDINGS Alignment:  Normal Vertebrae: Progressively confluent metastases throughout all the thoracic vertebrae. There are pathologic fractures involving the T3 superior endplate, T6 inferior endplate, T11 inferior endplate, and T12 inferior endplate. These have progressed from prior but show no marrow edema to suggest unhealed fracture. Tumor infiltration into the epidural space described previously is not confidently seen today. Cord:  Normal signal and morphology. Paraspinal and other soft tissues: Known liver metastases. Diffuse rib metastases. Disc levels: No degenerative impingement MRI LUMBAR SPINE FINDINGS Segmentation:  Standard. Alignment:  Physiologic. Vertebrae: Progressively confluent metastases at each level with L2, L3, L5, and S1 superior endplate fractures that have a chronic appearance. No visible  epidural or foraminal infiltration. Conus medullaris: Extends to the L1-2 disc level and appears normal. No abnormal cauda equina enhancement Paraspinal and other soft tissues: Sacral and ileal metastases. Disc levels: No degenerative stenosis or impingement. IMPRESSION: 1. Widespread osseous metastases, present at every spinal level, progressed from 2016 MRI. No noted epidural or foraminal infiltration. No evidence of intrathecal metastasis. 2. Numerous chronic endplate fractures are described above. No acute fracture noted. Electronically Signed   By: JMonte FantasiaM.D.   On: 04/16/2016 15:58   Ir [place]Guide Vasc Access Right  Result Date: 04/22/2016 INDICATION: 50year old with metastatic breast  cancer. EXAM: FLUOROSCOPIC AND ULTRASOUND GUIDED PLACEMENT OF A SUBCUTANEOUS PORT COMPARISON:  None. MEDICATIONS: Ancef 2 g; The antibiotic was administered within an appropriate time interval prior to skin puncture. ANESTHESIA/SEDATION: Versed 5.0 mg IV; Fentanyl 100 mcg IV; Moderate Sedation Time:  37 minutes The patient was continuously monitored during the procedure by the interventional radiology nurse under my direct supervision. FLUOROSCOPY TIME:  36 seconds, 6 mGy COMPLICATIONS: None immediate. PROCEDURE: The procedure, risks, benefits, and alternatives were explained to the patient. Questions regarding the procedure were encouraged and answered. The patient understands and consents to the procedure. Patient was placed supine on the interventional table. Ultrasound confirmed a patent right internal jugular vein. The right chest and neck were cleaned with a skin antiseptic and a sterile drape was placed. Maximal barrier sterile technique was utilized including caps, mask, sterile gowns, sterile gloves, sterile drape, hand hygiene and skin antiseptic. The right neck was anesthetized with 1% lidocaine. Small incision was made in the right neck with a blade. Micropuncture set was placed in the right internal  jugular vein with ultrasound guidance. The micropuncture wire was used for measurement purposes. The right chest was anesthetized with 1% lidocaine with epinephrine. #15 blade was used to make an incision and a subcutaneous port pocket was formed. [place] was assembled. Subcutaneous tunnel was formed with a stiff tunneling device. The port catheter was brought through the subcutaneous tunnel. The port was placed in the subcutaneous pocket. The micropuncture set was exchanged for a peel-away sheath. The catheter was placed through the peel-away sheath and the tip was positioned at the cavoatrial junction. Catheter placement was confirmed with fluoroscopy. The port was accessed and flushed with heparinized saline. The port pocket was closed using two layers of absorbable sutures and Dermabond. The vein skin site was closed using a single layer of absorbable suture and Dermabond. Sterile dressings were applied. Patient tolerated the procedure well without an immediate complication. Ultrasound and fluoroscopic images were taken and saved for this procedure. IMPRESSION: Placement of a subcutaneous port device. Catheter is ready to be used. Electronically Signed   By: Markus Daft M.D.   On: 04/22/2016 17:50   Ir Fluoro Guide Port Insertion Right  Result Date: 04/22/2016 INDICATION: 50 year old with metastatic breast cancer. EXAM: FLUOROSCOPIC AND ULTRASOUND GUIDED PLACEMENT OF A SUBCUTANEOUS PORT COMPARISON:  None. MEDICATIONS: Ancef 2 g; The antibiotic was administered within an appropriate time interval prior to skin puncture. ANESTHESIA/SEDATION: Versed 5.0 mg IV; Fentanyl 100 mcg IV; Moderate Sedation Time:  37 minutes The patient was continuously monitored during the procedure by the interventional radiology nurse under my direct supervision. FLUOROSCOPY TIME:  36 seconds, 6 mGy COMPLICATIONS: None immediate. PROCEDURE: The procedure, risks, benefits, and alternatives were explained to the patient.  Questions regarding the procedure were encouraged and answered. The patient understands and consents to the procedure. Patient was placed supine on the interventional table. Ultrasound confirmed a patent right internal jugular vein. The right chest and neck were cleaned with a skin antiseptic and a sterile drape was placed. Maximal barrier sterile technique was utilized including caps, mask, sterile gowns, sterile gloves, sterile drape, hand hygiene and skin antiseptic. The right neck was anesthetized with 1% lidocaine. Small incision was made in the right neck with a blade. Micropuncture set was placed in the right internal jugular vein with ultrasound guidance. The micropuncture wire was used for measurement purposes. The right chest was anesthetized with 1% lidocaine with epinephrine. #15 blade was used to make  an incision and a subcutaneous port pocket was formed. [place] was assembled. Subcutaneous tunnel was formed with a stiff tunneling device. The port catheter was brought through the subcutaneous tunnel. The port was placed in the subcutaneous pocket. The micropuncture set was exchanged for a peel-away sheath. The catheter was placed through the peel-away sheath and the tip was positioned at the cavoatrial junction. Catheter placement was confirmed with fluoroscopy. The port was accessed and flushed with heparinized saline. The port pocket was closed using two layers of absorbable sutures and Dermabond. The vein skin site was closed using a single layer of absorbable suture and Dermabond. Sterile dressings were applied. Patient tolerated the procedure well without an immediate complication. Ultrasound and fluoroscopic images were taken and saved for this procedure. IMPRESSION: Placement of a subcutaneous port device. Catheter is ready to be used. Electronically Signed   By: Markus Daft M.D.   On: 04/22/2016 17:50    ELIGIBLE FOR AVAILABLE RESEARCH PROTOCOL: no  ASSESSMENT: 50 y.o. BRCA  negative [place] woman originally from [place]  (1) status post left breast upper inner quadrant biopsy 05/17/2007 for a clinical mT2 N2, stage IIIA invasive ductal breast cancer, strongly estrogen receptor positive, weakly progesterone receptor positive, HER-2 negative, with an MIB-1 of 52%  (2) neoadjuvant chemotherapy consisted of dose dense doxorubicin and cyclophosphamide 4 followed by weekly paclitaxel 12, started 06/10/2007, completed 09/28/2007  (3) status post left modified radical mastectomy 10/27/2007 at [facility] [phone]) removed that invasive ductal carcinoma, grade 2, pT2, pN1a (downstaged to IIB), estrogen receptor strongly positive, progesterone receptor weakly positive, HER-2 not amplified by immunohistochemistry or FISH with a signals ratio of 0.94, number per cell 3.4  (4) status post adjuvant radiation to the left chest wall as well as the left supraclavicular and axillary nodal regions (50.4 gray +10 Gray boost) given between 12/15/2007 on 01/31/2008  (5) on adjuvant tamoxifen 02/20/2008 through March 2016  METASTATIC DISEASE: March 2016, presenting with bone pain (6) staging studies April 2016 showed diffuse bony metastatic disease, intrathoracic adenopathy, and small lung lesions.  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  (10) repeat genetic testing 04/23/2016 through the [facility] Hereditary Cancer Panel offered by  [employer] found no deleterious mutations in APC, ATM, BARD1, BMPR1A, BRCA1, BRCA2, BRIP1, CHD1, CDK4, CDKN2A, CHEK2, EPCAM (large rearrangement only), GREM1/SCG5, MLH1, MSH2, MSH6, MUTYH, NBN, PALB2, PMS2, POLD1, POLE, PTEN, RAD51C, RAD51D, SMAD4, STK11, and TP53  (11) gemcitabine started 04/24/2016, to be given days 1 and 8 of each 21 day cycle  (a) day 8 cycle 1 delayed because of cytopenias--dose reduced and neupogen added   PLAN: We spent the better part of an hour today reviewing Marshayla's course, prognosis, and treatment options. She understands that stage IV breast cancer is not curable with our current knowledge base. The goal of treatment is control. The strategy of treatment is to do only the minimum necessary to control the growth of the tumor so that the patient can have as normal a life as  possible. There is no survival advantage in treating aggressively if treating less aggressively results in tumor control. With this strategy stage IV breast cancer in many cases can function as a "chronic illness": something that cannot be quite gotten rid of but can be controlled for an indefinite period of time  There are always 3 questions to go over and so we reviewed those today. The first question is do we treat or not. In some cases the patient's overall situation is so discouraging that palliative/comfort care alone is appropriate. If the decision is made to treat, then the next question is whether anti-estrogens or chemotherapy is more appropriate. Once that decision is made than the third question is: Which agent or combination of agents in particular should be used?  In Ryanna's case, she clearly wishes to continue to be treated at this point. Her tumor progressed through anti-estrogen therapy and given involvement of the liver and lungs she was appropriately changed to chemotherapy. We then reviewed the possible toxicities, side effects and complications of gemcitabine and  in particular the fact that the first cycle day 8 had to be delayed because of low platelets and marginal neutrophils.  Accordingly the dose of Gemzar is being decreased. I suggested we add onPro to the day 8 treatment, but she had such a terrible experience with Neulasta previously she does not want to do that. Instead we are going to do Neupogen for 2 doses with each Gemzar treatment and hope that that maintains the neutrophil count sufficiently to allow for ongoing treatment without delays.  If we have continuing problems with thrombocytopenia, we will consider romiplostim, but I would not be uncomfortable treating her with a platelet count of 50,000 so long as we follow it closely.  Once she completes at least 3 cycles of gemcitabine we will obtain restaging studies.  Juliann Mule and Jill Johnston understands we do not expect chemotherapy to cross the blood-brain barrier so if there is further recurrence to the brain that will have to be addressed with further radiation. Because she already had whole brain irradiation, most likely that would have to be stereotactic radiotherapy. If there is recurrent leptomeningeal disease we can consider placement of an [place] reservoir and intrathecal chemotherapy, but the alternative of that point would be switching to comfort care under hospice.  She will return to start cycle to 05/29/2016. She will see me the same day. She knows to call for any problems that may develop before her next visit here.   [name], MD   05/16/2016 10:18 AM Medical Oncology and Hematology [facility] [address] [place], [place] [address] Tel. [phone]    Fax. [phone]

## 2016-05-16 ENCOUNTER — Other Ambulatory Visit (INDEPENDENT_AMBULATORY_CARE_PROVIDER_SITE_OTHER): Payer: Self-pay | Admitting: Internal Medicine

## 2016-05-16 ENCOUNTER — Ambulatory Visit (HOSPITAL_BASED_OUTPATIENT_CLINIC_OR_DEPARTMENT_OTHER): Payer: BLUE CROSS/BLUE SHIELD

## 2016-05-16 VITALS — BP 125/91 | HR 136 | Temp 98.6°F | Resp 20

## 2016-05-16 DIAGNOSIS — C50912 Malignant neoplasm of unspecified site of left female breast: Secondary | ICD-10-CM

## 2016-05-16 DIAGNOSIS — K2100 Gastro-esophageal reflux disease with esophagitis, without bleeding: Secondary | ICD-10-CM

## 2016-05-16 DIAGNOSIS — C7951 Secondary malignant neoplasm of bone: Secondary | ICD-10-CM

## 2016-05-16 DIAGNOSIS — C50212 Malignant neoplasm of upper-inner quadrant of left female breast: Secondary | ICD-10-CM

## 2016-05-16 MED ORDER — TBO-FILGRASTIM 300 MCG/0.5ML ~~LOC~~ SOSY
300.0000 ug | PREFILLED_SYRINGE | Freq: Once | SUBCUTANEOUS | Status: AC
  Administered 2016-05-16: 300 ug via SUBCUTANEOUS

## 2016-05-16 NOTE — Patient Instructions (Signed)
Tbo-Filgrastim injection What is this medicine? TBO-FILGRASTIM (T B O fil GRA stim) is a granulocyte colony-stimulating factor that stimulates the growth of neutrophils, a type of white blood cell important in the body's fight against infection. It is used to reduce the incidence of fever and infection in patients with certain types of cancer who are receiving chemotherapy that affects the bone marrow. COMMON BRAND NAME(S): Granix What should I tell my health care provider before I take this medicine? They need to know if you have any of these conditions: -ongoing radiation therapy -sickle cell anemia -an unusual or allergic reaction to tbo-filgrastim, filgrastim, pegfilgrastim, other medicines, foods, dyes, or preservatives -pregnant or trying to get pregnant -breast-feeding How should I use this medicine? This medicine is for injection under the skin. If you get this medicine at home, you will be taught how to prepare and give this medicine. Refer to the Instructions for Use that come with your medication packaging. Use exactly as directed. Take your medicine at regular intervals. Do not take your medicine more often than directed. It is important that you put your used needles and syringes in a special sharps container. Do not put them in a trash can. If you do not have a sharps container, call your pharmacist or healthcare provider to get one. Talk to your pediatrician regarding the use of this medicine in children. Special care may be needed. What if I miss a dose? It is important not to miss your dose. Call your doctor or health care professional if you miss a dose. What may interact with this medicine? This medicine may interact with the following medications: -medicines that may cause a release of neutrophils, such as lithium What should I watch for while using this medicine? You may need blood work done while you are taking this medicine. What side effects may I notice from receiving  this medicine? Side effects that you should report to your doctor or health care professional as soon as possible: -allergic reactions like skin rash, itching or hives, swelling of the face, lips, or tongue -shortness of breath or breathing problems -fever -pain, redness, or irritation at site where injected -pinpoint red spots on the skin -stomach or side pain, or pain at the shoulder -swelling -tiredness -trouble passing urine Side effects that usually do not require medical attention (report to your doctor or health care professional if they continue or are bothersome): -bone pain -muscle pain Where should I keep my medicine? Keep out of the reach of children. Store in a refrigerator between 2 and 8 degrees C (36 and 46 degrees F). Keep in carton to protect from light. Throw away this medicine if it is left out of the refrigerator for more than 5 consecutive days. Throw away any unused medicine after the expiration date.  2017 Elsevier/Gold Standard (2015-07-18 12:15:25)  

## 2016-05-17 ENCOUNTER — Encounter (HOSPITAL_COMMUNITY): Payer: Self-pay | Admitting: Emergency Medicine

## 2016-05-17 ENCOUNTER — Inpatient Hospital Stay (HOSPITAL_COMMUNITY)
Admission: EM | Admit: 2016-05-17 | Discharge: 2016-05-20 | DRG: 193 | Disposition: A | Discharge status: Home / Self Care | Payer: BLUE CROSS/BLUE SHIELD | Attending: Internal Medicine | Admitting: Internal Medicine

## 2016-05-17 ENCOUNTER — Inpatient Hospital Stay (HOSPITAL_COMMUNITY): Payer: BLUE CROSS/BLUE SHIELD

## 2016-05-17 ENCOUNTER — Emergency Department (HOSPITAL_COMMUNITY): Payer: BLUE CROSS/BLUE SHIELD

## 2016-05-17 DIAGNOSIS — Z1379 Encounter for other screening for genetic and chromosomal anomalies: Secondary | ICD-10-CM

## 2016-05-17 DIAGNOSIS — Z808 Family history of malignant neoplasm of other organs or systems: Secondary | ICD-10-CM

## 2016-05-17 DIAGNOSIS — R509 Fever, unspecified: Secondary | ICD-10-CM | POA: Diagnosis not present

## 2016-05-17 DIAGNOSIS — Z79891 Long term (current) use of opiate analgesic: Secondary | ICD-10-CM | POA: Diagnosis not present

## 2016-05-17 DIAGNOSIS — C50912 Malignant neoplasm of unspecified site of left female breast: Secondary | ICD-10-CM | POA: Diagnosis not present

## 2016-05-17 DIAGNOSIS — C799 Secondary malignant neoplasm of unspecified site: Secondary | ICD-10-CM

## 2016-05-17 DIAGNOSIS — Z9221 Personal history of antineoplastic chemotherapy: Secondary | ICD-10-CM

## 2016-05-17 DIAGNOSIS — C7802 Secondary malignant neoplasm of left lung: Secondary | ICD-10-CM | POA: Diagnosis present

## 2016-05-17 DIAGNOSIS — C7949 Secondary malignant neoplasm of other parts of nervous system: Secondary | ICD-10-CM

## 2016-05-17 DIAGNOSIS — K219 Gastro-esophageal reflux disease without esophagitis: Secondary | ICD-10-CM | POA: Diagnosis present

## 2016-05-17 DIAGNOSIS — B370 Candidal stomatitis: Secondary | ICD-10-CM

## 2016-05-17 DIAGNOSIS — I890 Lymphedema, not elsewhere classified: Secondary | ICD-10-CM

## 2016-05-17 DIAGNOSIS — J9811 Atelectasis: Secondary | ICD-10-CM | POA: Diagnosis present

## 2016-05-17 DIAGNOSIS — C7801 Secondary malignant neoplasm of right lung: Secondary | ICD-10-CM | POA: Diagnosis present

## 2016-05-17 DIAGNOSIS — D6959 Other secondary thrombocytopenia: Secondary | ICD-10-CM | POA: Diagnosis not present

## 2016-05-17 DIAGNOSIS — D702 Other drug-induced agranulocytosis: Secondary | ICD-10-CM | POA: Diagnosis present

## 2016-05-17 DIAGNOSIS — F419 Anxiety disorder, unspecified: Secondary | ICD-10-CM | POA: Diagnosis present

## 2016-05-17 DIAGNOSIS — D701 Agranulocytosis secondary to cancer chemotherapy: Secondary | ICD-10-CM

## 2016-05-17 DIAGNOSIS — C787 Secondary malignant neoplasm of liver and intrahepatic bile duct: Secondary | ICD-10-CM | POA: Diagnosis present

## 2016-05-17 DIAGNOSIS — Z79899 Other long term (current) drug therapy: Secondary | ICD-10-CM | POA: Diagnosis not present

## 2016-05-17 DIAGNOSIS — I10 Essential (primary) hypertension: Secondary | ICD-10-CM | POA: Diagnosis not present

## 2016-05-17 DIAGNOSIS — C50212 Malignant neoplasm of upper-inner quadrant of left female breast: Secondary | ICD-10-CM | POA: Diagnosis not present

## 2016-05-17 DIAGNOSIS — C7931 Secondary malignant neoplasm of brain: Secondary | ICD-10-CM | POA: Diagnosis present

## 2016-05-17 DIAGNOSIS — D61810 Antineoplastic chemotherapy induced pancytopenia: Secondary | ICD-10-CM | POA: Diagnosis present

## 2016-05-17 DIAGNOSIS — K2100 Gastro-esophageal reflux disease with esophagitis, without bleeding: Secondary | ICD-10-CM

## 2016-05-17 DIAGNOSIS — I493 Ventricular premature depolarization: Secondary | ICD-10-CM | POA: Diagnosis present

## 2016-05-17 DIAGNOSIS — C7951 Secondary malignant neoplasm of bone: Secondary | ICD-10-CM | POA: Diagnosis present

## 2016-05-17 DIAGNOSIS — T451X5A Adverse effect of antineoplastic and immunosuppressive drugs, initial encounter: Secondary | ICD-10-CM | POA: Diagnosis present

## 2016-05-17 DIAGNOSIS — J189 Pneumonia, unspecified organism: Principal | ICD-10-CM | POA: Diagnosis present

## 2016-05-17 DIAGNOSIS — R000 Tachycardia, unspecified: Secondary | ICD-10-CM | POA: Diagnosis present

## 2016-05-17 DIAGNOSIS — Y95 Nosocomial condition: Secondary | ICD-10-CM | POA: Diagnosis present

## 2016-05-17 DIAGNOSIS — Z170 Estrogen receptor positive status [ER+]: Secondary | ICD-10-CM

## 2016-05-17 DIAGNOSIS — Z800 Family history of malignant neoplasm of digestive organs: Secondary | ICD-10-CM

## 2016-05-17 DIAGNOSIS — Z95828 Presence of other vascular implants and grafts: Secondary | ICD-10-CM

## 2016-05-17 DIAGNOSIS — R200 Anesthesia of skin: Secondary | ICD-10-CM

## 2016-05-17 DIAGNOSIS — G893 Neoplasm related pain (acute) (chronic): Secondary | ICD-10-CM

## 2016-05-17 DIAGNOSIS — E876 Hypokalemia: Secondary | ICD-10-CM | POA: Diagnosis present

## 2016-05-17 DIAGNOSIS — Z9012 Acquired absence of left breast and nipple: Secondary | ICD-10-CM

## 2016-05-17 DIAGNOSIS — C7800 Secondary malignant neoplasm of unspecified lung: Secondary | ICD-10-CM | POA: Diagnosis not present

## 2016-05-17 DIAGNOSIS — Z8249 Family history of ischemic heart disease and other diseases of the circulatory system: Secondary | ICD-10-CM

## 2016-05-17 DIAGNOSIS — Z9049 Acquired absence of other specified parts of digestive tract: Secondary | ICD-10-CM

## 2016-05-17 LAB — CBC WITH DIFFERENTIAL/PLATELET
BASOS ABS: 0 10*3/uL (ref 0.0–0.1)
Basophils Relative: 0 %
Eosinophils Absolute: 0 10*3/uL (ref 0.0–0.7)
Eosinophils Relative: 0 %
HCT: 30.1 % — ABNORMAL LOW (ref 36.0–46.0)
HEMOGLOBIN: 9.8 g/dL — AB (ref 12.0–15.0)
LYMPHS PCT: 9 %
Lymphs Abs: 0.4 10*3/uL — ABNORMAL LOW (ref 0.7–4.0)
MCH: 34.3 pg — ABNORMAL HIGH (ref 26.0–34.0)
MCHC: 32.6 g/dL (ref 30.0–36.0)
MCV: 105.2 fL — ABNORMAL HIGH (ref 78.0–100.0)
MONOS PCT: 1 %
Monocytes Absolute: 0 10*3/uL — ABNORMAL LOW (ref 0.1–1.0)
Neutro Abs: 3.6 10*3/uL (ref 1.7–7.7)
Neutrophils Relative %: 90 %
Platelets: 130 10*3/uL — ABNORMAL LOW (ref 150–400)
RBC: 2.86 MIL/uL — AB (ref 3.87–5.11)
RDW: 14.5 % (ref 11.5–15.5)
WBC MORPHOLOGY: INCREASED
WBC: 4 10*3/uL (ref 4.0–10.5)

## 2016-05-17 LAB — COMPREHENSIVE METABOLIC PANEL
ALT: 167 U/L — ABNORMAL HIGH (ref 14–54)
AST: 105 U/L — AB (ref 15–41)
Albumin: 2.7 g/dL — ABNORMAL LOW (ref 3.5–5.0)
Alkaline Phosphatase: 125 U/L (ref 38–126)
Anion gap: 5 (ref 5–15)
BUN: 11 mg/dL (ref 6–20)
CHLORIDE: 105 mmol/L (ref 101–111)
CO2: 26 mmol/L (ref 22–32)
Calcium: 8.1 mg/dL — ABNORMAL LOW (ref 8.9–10.3)
Creatinine, Ser: 0.65 mg/dL (ref 0.44–1.00)
GFR calc Af Amer: >60 mL/min (ref >60)
Glucose, Bld: 154 mg/dL — ABNORMAL HIGH (ref 65–99)
POTASSIUM: 3.5 mmol/L (ref 3.5–5.1)
SODIUM: 136 mmol/L (ref 135–145)
Total Bilirubin: 1.5 mg/dL — ABNORMAL HIGH (ref 0.3–1.2)
Total Protein: 5.4 g/dL — ABNORMAL LOW (ref 6.5–8.1)

## 2016-05-17 LAB — CBC
HEMATOCRIT: 27.4 % — AB (ref 36.0–46.0)
Hemoglobin: 9 g/dL — ABNORMAL LOW (ref 12.0–15.0)
MCH: 33.8 pg (ref 26.0–34.0)
MCHC: 32.8 g/dL (ref 30.0–36.0)
MCV: 103 fL — AB (ref 78.0–100.0)
Platelets: 121 10*3/uL — ABNORMAL LOW (ref 150–400)
RBC: 2.66 MIL/uL — AB (ref 3.87–5.11)
RDW: 14.3 % (ref 11.5–15.5)
WBC: 3 10*3/uL — AB (ref 4.0–10.5)

## 2016-05-17 LAB — URINALYSIS, ROUTINE W REFLEX MICROSCOPIC
Glucose, UA: NEGATIVE mg/dL
HGB URINE DIPSTICK: NEGATIVE
Ketones, ur: NEGATIVE mg/dL
NITRITE: NEGATIVE
Protein, ur: 30 mg/dL — AB
SPECIFIC GRAVITY, URINE: 1.027 (ref 1.005–1.030)
pH: 6 (ref 5.0–8.0)

## 2016-05-17 LAB — CREATININE, SERUM
Creatinine, Ser: 0.74 mg/dL (ref 0.44–1.00)
GFR calc non Af Amer: >60 mL/min (ref >60)

## 2016-05-17 LAB — URINE MICROSCOPIC-ADD ON: RBC / HPF: NONE SEEN RBC/hpf (ref 0–5)

## 2016-05-17 LAB — STREP PNEUMONIAE URINARY ANTIGEN: Strep Pneumo Urinary Antigen: NEGATIVE

## 2016-05-17 LAB — I-STAT CG4 LACTIC ACID, ED
LACTIC ACID, VENOUS: 1.39 mmol/L (ref 0.5–1.9)
LACTIC ACID, VENOUS: 1.4 mmol/L (ref 0.5–1.9)

## 2016-05-17 LAB — D-DIMER, QUANTITATIVE (NOT AT ARMC): D DIMER QUANT: 2.46 ug{FEU}/mL — AB (ref 0.00–0.50)

## 2016-05-17 MED ORDER — DOCUSATE SODIUM 100 MG PO CAPS
100.0000 mg | ORAL_CAPSULE | Freq: Every day | ORAL | Status: DC
  Administered 2016-05-17 – 2016-05-20 (×3): 100 mg via ORAL
  Filled 2016-05-17 (×4): qty 1

## 2016-05-17 MED ORDER — SODIUM CHLORIDE 0.9% FLUSH
10.0000 mL | INTRAVENOUS | Status: DC | PRN
  Administered 2016-05-20: 10 mL
  Filled 2016-05-17: qty 40

## 2016-05-17 MED ORDER — SODIUM CHLORIDE 0.9 % IV SOLN
INTRAVENOUS | Status: AC
  Administered 2016-05-17: 11:00:00 via INTRAVENOUS

## 2016-05-17 MED ORDER — OXYCODONE HCL ER 15 MG PO T12A
30.0000 mg | EXTENDED_RELEASE_TABLET | ORAL | Status: DC
  Administered 2016-05-17 – 2016-05-20 (×6): 30 mg via ORAL
  Filled 2016-05-17 (×6): qty 2

## 2016-05-17 MED ORDER — VANCOMYCIN HCL IN DEXTROSE 750-5 MG/150ML-% IV SOLN
750.0000 mg | Freq: Three times a day (TID) | INTRAVENOUS | Status: DC
  Administered 2016-05-17 – 2016-05-20 (×8): 750 mg via INTRAVENOUS
  Filled 2016-05-17 (×10): qty 150

## 2016-05-17 MED ORDER — POLYETHYLENE GLYCOL 3350 17 G PO PACK
17.0000 g | PACK | Freq: Every day | ORAL | Status: DC
  Administered 2016-05-17 – 2016-05-20 (×3): 17 g via ORAL
  Filled 2016-05-17 (×4): qty 1

## 2016-05-17 MED ORDER — DEXTROSE 5 % IV SOLN
1.0000 g | Freq: Three times a day (TID) | INTRAVENOUS | Status: DC
  Filled 2016-05-17 (×2): qty 1

## 2016-05-17 MED ORDER — OXYCODONE HCL ER 15 MG PO T12A: 30.0000 mg | EXTENDED_RELEASE_TABLET | Freq: Two times a day (BID) | ORAL | Status: DC

## 2016-05-17 MED ORDER — IOPAMIDOL (ISOVUE-370) INJECTION 76%
INTRAVENOUS | Status: AC
  Administered 2016-05-17: 50 mL
  Filled 2016-05-17: qty 100

## 2016-05-17 MED ORDER — OXYCODONE HCL 5 MG PO TABS
10.0000 mg | ORAL_TABLET | ORAL | Status: DC | PRN
  Administered 2016-05-19: 5 mg via ORAL
  Filled 2016-05-17: qty 2

## 2016-05-17 MED ORDER — SODIUM CHLORIDE 0.9 % IV SOLN
INTRAVENOUS | Status: DC
  Administered 2016-05-18: 03:00:00 via INTRAVENOUS

## 2016-05-17 MED ORDER — IOPAMIDOL (ISOVUE-370) INJECTION 76%: 100.0000 mL | Freq: Once | INTRAVENOUS | Status: DC | PRN

## 2016-05-17 MED ORDER — LISINOPRIL 10 MG PO TABS
10.0000 mg | ORAL_TABLET | Freq: Every day | ORAL | Status: DC
  Administered 2016-05-20: 10 mg via ORAL
  Filled 2016-05-17 (×4): qty 1

## 2016-05-17 MED ORDER — ONDANSETRON HCL 4 MG/2ML IJ SOLN
4.0000 mg | Freq: Four times a day (QID) | INTRAMUSCULAR | Status: DC | PRN
  Filled 2016-05-17: qty 2

## 2016-05-17 MED ORDER — PANTOPRAZOLE SODIUM 40 MG PO TBEC
40.0000 mg | DELAYED_RELEASE_TABLET | Freq: Two times a day (BID) | ORAL | Status: DC
  Administered 2016-05-17 – 2016-05-20 (×6): 40 mg via ORAL
  Filled 2016-05-17 (×6): qty 1

## 2016-05-17 MED ORDER — DEXTROSE 5 % IV SOLN
2.0000 g | Freq: Once | INTRAVENOUS | Status: AC
  Administered 2016-05-17: 2 g via INTRAVENOUS
  Filled 2016-05-17: qty 2

## 2016-05-17 MED ORDER — ACETAMINOPHEN 325 MG PO TABS
650.0000 mg | ORAL_TABLET | ORAL | Status: DC | PRN
  Administered 2016-05-17 – 2016-05-18 (×4): 650 mg via ORAL
  Filled 2016-05-17 (×4): qty 2

## 2016-05-17 MED ORDER — SODIUM CHLORIDE 0.9 % IJ SOLN
INTRAMUSCULAR | Status: AC
  Filled 2016-05-17: qty 50

## 2016-05-17 MED ORDER — VANCOMYCIN HCL IN DEXTROSE 1-5 GM/200ML-% IV SOLN
1000.0000 mg | Freq: Once | INTRAVENOUS | Status: AC
  Administered 2016-05-17: 1000 mg via INTRAVENOUS
  Filled 2016-05-17: qty 200

## 2016-05-17 MED ORDER — SODIUM CHLORIDE 0.9 % IV BOLUS (SEPSIS)
1000.0000 mL | Freq: Once | INTRAVENOUS | Status: AC
  Administered 2016-05-17: 1000 mL via INTRAVENOUS

## 2016-05-17 MED ORDER — ENSURE ENLIVE PO LIQD
237.0000 mL | Freq: Two times a day (BID) | ORAL | Status: DC
  Administered 2016-05-18: 237 mL via ORAL

## 2016-05-17 MED ORDER — HEPARIN SODIUM (PORCINE) 5000 UNIT/ML IJ SOLN
5000.0000 [IU] | Freq: Three times a day (TID) | INTRAMUSCULAR | Status: DC
Start: 2016-05-17 — End: 2016-05-17

## 2016-05-17 MED ORDER — DEXTROSE 5 % IV SOLN
2.0000 g | Freq: Three times a day (TID) | INTRAVENOUS | Status: DC
  Administered 2016-05-17 – 2016-05-20 (×9): 2 g via INTRAVENOUS
  Filled 2016-05-17 (×10): qty 2

## 2016-05-17 NOTE — ED Provider Notes (Addendum)
[facility] DEPT Provider Note   CSN: [id number] Arrival date & time: 05/17/16  0802     History   Chief Complaint Chief Complaint  Patient presents with  . Fever  . Chemo Pt    HPI Jill Johnston is a 50 y.o. female.  Patient w hx metastatic breast ca, c/o fevers onset last evening. Pt states her temperature was over 100 - family member indicates ?105. Mild nasal/sinus congestion, no sinus pain. Denies sore throat, cough or other uri c/o. No body aches. Denies headaches. No neck pain or stiffness. No chest pain or sob. No abd pain. No nvd. No dysuria or gu c/o. No rash. Has right chest port - no pain/redness at site. No known ill contacts. Last had chemo tx a few days ago.    The history is provided by the patient.  Fever   Associated symptoms include congestion. Pertinent negatives include no chest pain, no headaches, no sore throat and no cough.    Past Medical History:  Diagnosis Date  . Anxiety   . Breast cancer ([place])    Stage IV, ER positive left upper outer quadrant breast cancer with metastasis to bone  . Breast cancer metastasized to multiple sites ([place]) 04/12/2016   bone, liver, lung and brain  . GERD (gastroesophageal reflux disease)   . Hypertension     Patient Active Problem List   Diagnosis Date Noted  . Genetic testing 05/11/2016  . Chemotherapy-induced thrombocytopenia 05/10/2016  . Leukopenia due to antineoplastic chemotherapy ([place]) 05/10/2016  . Metastasis to liver ([place]) 04/24/2016  . Breast cancer metastasized to lung, left ([place]) 04/24/2016  . Portacath in place 04/24/2016  . Oral thrush 04/24/2016  . Numbness 04/16/2016  . Essential hypertension 04/16/2016  . Carcinoma of left breast metastatic to multiple sites ([place]) 04/12/2016  . 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 Gastroesophageal reflux disease with esophagitis 03/20/2016  . Drug-induced leukopenia ([place])  01/26/2016  . High risk medication use 01/26/2016  . Lymphedema of left arm 01/12/2016  . Status post cholecystectomy 01/12/2016  . Cancer associated pain 01/12/2016  . Breast cancer metastasized to bone, left ([place]) 01/12/2016  . Gastroesophageal reflux disease 07/04/2015  . Bone metastases ([place]) 01/07/2015  . Malignant neoplasm of left breast [facility])     Past Surgical History:  Procedure Laterality Date  . CHOLECYSTECTOMY    . ESOPHAGOGASTRODUODENOSCOPY (EGD) WITH PROPOFOL N/A 07/12/2015   Procedure: ESOPHAGOGASTRODUODENOSCOPY (EGD) WITH PROPOFOL;  Surgeon: Rogene Houston, MD;  Location: [facility] ENDO SUITE;  Service: Endoscopy;  Laterality: N/A;  1:00  . IR GENERIC HISTORICAL  04/22/2016   IR US GUIDE VASC ACCESS RIGHT 04/22/2016 Markus Daft, MD [facility]-INTERV RAD  . IR GENERIC HISTORICAL  04/22/2016   IR FLUORO GUIDE PORT INSERTION RIGHT 04/22/2016 Markus Daft, MD [facility]-INTERV RAD  . MASTECTOMY  April 2009   Left breast with lymph node resection    OB History    No data available       Home Medications    Prior to Admission medications   Medication Sig Start Date End Date Taking? Authorizing Provider  acetaminophen (TYLENOL) 500 MG tablet Take 1,000 mg by mouth every 6 (six) hours as needed.   Yes Historical Provider, MD  diphenhydrAMINE (BENADRYL) 25 mg capsule Take 25 mg by mouth at bedtime as needed.   Yes Historical Provider, MD  docusate sodium (COLACE) 100 MG capsule Take 100 mg by mouth daily.    Yes  Historical Provider, MD  fluconazole (DIFLUCAN) 100 MG tablet Take 1 tablet (100 mg total) by mouth daily as needed (for thrush). 05/08/16  Yes Lennis Marion Downer, MD  ibuprofen (ADVIL,MOTRIN) 200 MG tablet Take 400 mg by mouth daily as needed.   Yes Historical Provider, MD  lidocaine-prilocaine (EMLA) cream Apply 1 application topically as needed. To port 1 hour before going to be accessed with needle. Cover with plastic wrap. 04/17/16  Yes Lennis Marion Downer, MD  lisinopril (PRINIVIL,ZESTRIL)  10 MG tablet Take 1 tablet (10 mg total) by mouth daily. 01/23/16  Yes Lennis Marion Downer, MD  oxyCODONE (OXYCONTIN) 30 MG 12 hr tablet Take 30 mg by mouth 2 (two) times daily. 05/08/16  Yes Lennis Marion Downer, MD  pantoprazole (PROTONIX) 40 MG tablet Take 1 tablet (40 mg total) by mouth 2 (two) times daily before a meal. 01/06/16  Yes Butch Penny, NP  polyethylene glycol (MIRALAX / GLYCOLAX) packet Take 17 grams by mouth every day. 10/11/14  Yes Historical Provider, MD  chlorhexidine (PERIDEX) 0.12 % solution Use as directed 15 mLs in the mouth or throat 3 (three) times daily. Patient not taking: Reported on 05/17/2016 04/24/16   Gordy Levan, MD  nystatin (MYCOSTATIN) 100000 UNIT/ML suspension Take 5 mLs (500,000 Units total) by mouth 4 (four) times daily. Patient not taking: Reported on 05/17/2016 04/17/16   Florencia Reasons, MD  ondansetron (ZOFRAN) 8 MG tablet Take 1 tablet (8 mg total) by mouth every 8 (eight) hours as needed for nausea or vomiting. Patient not taking: Reported on 05/17/2016 04/21/16   Gordy Levan, MD  Oxycodone HCl 10 MG TABS Take 1 tablet by mouth every 3 (three) hours as needed for pain. 11/29/15   Historical Provider, MD    Family History Family History  Problem Relation Age of Onset  . Heart attack Mother     Died age 98  . Heart disease Father     Died age 56  . Brain cancer Father     pt not sure if he actually had this  . Cancer Maternal Grandmother     dx. NOS cancer at older age; d. 26  . Heart attack Maternal Grandfather 79  . Colon cancer Paternal Grandmother     d. 56s    Social History Social History  Substance Use Topics  . Smoking status: Never Smoker  . Smokeless tobacco: Never Used  . Alcohol use No     Allergies   Patient has no known allergies.   Review of Systems Review of Systems  Constitutional: Positive for fever. Negative for chills.  HENT: Positive for congestion. Negative for sore throat.   Eyes: Negative for redness.    Respiratory: Negative for cough and shortness of breath.   Cardiovascular: Negative for chest pain.  Gastrointestinal: Negative for abdominal pain.  Genitourinary: Negative for dysuria and flank pain.  Musculoskeletal: Negative for back pain and neck pain.  Skin: Negative for rash.  Neurological: Negative for headaches.  Hematological: Does not bruise/bleed easily.  Psychiatric/Behavioral: Negative for confusion.     Physical Exam Updated Vital Signs BP 97/76 (BP Location: Right Arm)   Pulse (!) 140   Temp 98.1 F (36.7 C) (Oral)   Resp 20   Ht 5\' 5"  (1.651 m)   Wt 70.8 kg   SpO2 92%   BMI 25.96 kg/m   Physical Exam  Constitutional: She appears well-developed and well-nourished. No distress.  HENT:  Mouth/Throat: Oropharynx is clear and moist.  Eyes: Conjunctivae are normal. No scleral icterus.  Neck: Neck supple. No tracheal deviation present.  Cardiovascular: Normal rate, regular rhythm, normal heart sounds and intact distal pulses.   Pulmonary/Chest: Effort normal. No respiratory distress.  Port right chest without sign of infection. Rale base.   Abdominal: Soft. Normal appearance and bowel sounds are normal. She exhibits no distension. There is no tenderness.  Genitourinary:  Genitourinary Comments: No cva tenderness  Musculoskeletal: She exhibits no edema.  Neurological: She is alert.  Skin: Skin is warm and dry. No rash noted. She is not diaphoretic.  Psychiatric: She has a normal mood and affect.  Nursing note and vitals reviewed.    ED Treatments / Results  Labs (all labs ordered are listed, but only abnormal results are displayed) Results for orders placed or performed during the hospital encounter of 05/17/16  Comprehensive metabolic panel  Result Value Ref Range   Sodium 136 135 - 145 mmol/L   Potassium 3.5 3.5 - 5.1 mmol/L   Chloride 105 101 - 111 mmol/L   CO2 26 22 - 32 mmol/L   Glucose, Bld 154 (H) 65 - 99 mg/dL   BUN 11 6 - 20 mg/dL    Creatinine, Ser 0.65 0.44 - 1.00 mg/dL   Calcium 8.1 (L) 8.9 - 10.3 mg/dL   Total Protein 5.4 (L) 6.5 - 8.1 g/dL   Albumin 2.7 (L) 3.5 - 5.0 g/dL   AST 105 (H) 15 - 41 U/L   ALT 167 (H) 14 - 54 U/L   Alkaline Phosphatase 125 38 - 126 U/L   Total Bilirubin 1.5 (H) 0.3 - 1.2 mg/dL   GFR calc non Af Amer >60 >60 mL/min   GFR calc Af Amer >60 >60 mL/min   Anion gap 5 5 - 15  CBC with Differential/Platelet  Result Value Ref Range   WBC 4.0 4.0 - 10.5 K/uL   RBC 2.86 (L) 3.87 - 5.11 MIL/uL   Hemoglobin 9.8 (L) 12.0 - 15.0 g/dL   HCT 30.1 (L) 36.0 - 46.0 %   MCV 105.2 (H) 78.0 - 100.0 fL   MCH 34.3 (H) 26.0 - 34.0 pg   MCHC 32.6 30.0 - 36.0 g/dL   RDW 14.5 11.5 - 15.5 %   Platelets 130 (L) 150 - 400 K/uL   Neutrophils Relative % PENDING %   Neutro Abs PENDING 1.7 - 7.7 K/uL   Band Neutrophils PENDING %   Lymphocytes Relative PENDING %   Lymphs Abs PENDING 0.7 - 4.0 K/uL   Monocytes Relative PENDING %   Monocytes Absolute PENDING 0.1 - 1.0 K/uL   Eosinophils Relative PENDING %   Eosinophils Absolute PENDING 0.0 - 0.7 K/uL   Basophils Relative PENDING %   Basophils Absolute PENDING 0.0 - 0.1 K/uL   WBC Morphology PENDING    RBC Morphology PENDING    Smear Review PENDING    nRBC PENDING 0 /100 WBC   Metamyelocytes Relative PENDING %   Myelocytes PENDING %   Promyelocytes Absolute PENDING %   Blasts PENDING %  I-Stat CG4 Lactic Acid, ED  Result Value Ref Range   Lactic Acid, Venous 1.39 0.5 - 1.9 mmol/L   Dg Chest 2 View  Result Date: 05/17/2016 CLINICAL DATA:  Fever.  Breast cancer, chemotherapy patient. EXAM: CHEST  2 VIEW COMPARISON:  05/04/2016 FINDINGS: Right Port-A-Cath is in place with the tip in the SVC. Increasing bibasilar airspace opacities since prior study which could represent atelectasis or infiltrate. Area most concerning for pneumonia  is in the right lung base. No effusions. Heart is normal size. IMPRESSION: Increasing bibasilar atelectasis or infiltrates. Cannot  exclude pneumonia, particularly in the right base. Electronically Signed   By: Rolm Baptise M.D.   On: 05/17/2016 08:47      EKG  EKG Interpretation None       Radiology Dg Chest 2 View  Result Date: 05/17/2016 CLINICAL DATA:  Fever.  Breast cancer, chemotherapy patient. EXAM: CHEST  2 VIEW COMPARISON:  05/04/2016 FINDINGS: Right Port-A-Cath is in place with the tip in the SVC. Increasing bibasilar airspace opacities since prior study which could represent atelectasis or infiltrate. Area most concerning for pneumonia is in the right lung base. No effusions. Heart is normal size. IMPRESSION: Increasing bibasilar atelectasis or infiltrates. Cannot exclude pneumonia, particularly in the right base. Electronically Signed   By: Rolm Baptise M.D.   On: 05/17/2016 08:47    Procedures Procedures (including critical care time)  Medications Ordered in ED Medications - No data to display   Initial Impression / Assessment and Plan / ED Course  I have reviewed the triage vital signs and the nursing notes.  Pertinent labs & imaging results that were available during my care of the patient were reviewed by me and considered in my medical decision making (see chart for details).  Clinical Course     Iv ns. Labs. Cultures.  Reviewed nursing notes and prior charts for additional history.   Infiltrate on cxr, room air pulse ox 90%, recent chemo - will rx for possible hcap, and admit.   IV abx given.  Hospitalists consulted for admission.  Discussed pt with Dr Tana [place] - will admit.  Discussed pt with heme/onc, Dr Sonny Dandy - they will see in consult.    Final Clinical Impressions(s) / ED Diagnoses   Final diagnoses:  None    New Prescriptions New Prescriptions   No medications on file       Lajean Saver, MD 05/17/16 1129

## 2016-05-17 NOTE — Progress Notes (Signed)
Pharmacy Antibiotic Note  Jill Johnston is a 50 y.o. female admitted on 05/17/2016 with fever and suspected pneumonia.  PMH includes metastatic breast cancer with implanted port used for chemotherapy.  Pharmacy has been consulted for Vancomycin and Cefepime dosing.  SCr 0.65, CrCl ~ 83 ml/min Lactic acid 1.39 WBC 4, ANC pending  Plan:  Cefepime 2g IV q8h  Vancomycin 1g IV x1 then 750 mg IV q8h.  Measure Vanc trough at steady state.  Follow up renal fxn, culture results, and clinical course.   Height: 5\' 5"  (165.1 cm) Weight: 156 lb (70.8 kg) IBW/kg (Calculated) : 57  Temp (24hrs), Avg:98.1 F (36.7 C), Min:98.1 F (36.7 C), Max:98.1 F (36.7 C)   Recent Labs Lab 05/15/16 0933 05/17/16 0852 05/17/16 0916  WBC 2.4* 4.0  --   CREATININE 0.7 0.65  --   LATICACIDVEN  --   --  1.39    Estimated Creatinine Clearance: 83 mL/min (by C-G formula based on SCr of 0.65 mg/dL).    No Known Allergies  Antimicrobials this admission: 11/19 Vancomycin >>  11/19 Cefepime >>   Dose adjustments this admission:   Microbiology results: 11/19 BCx: sent 11/19 UCx: sent   Thank you for allowing pharmacy to be a part of this patient's care.  Gretta Arab PharmD, BCPS Pager [phone] 05/17/2016 11:10 AM

## 2016-05-17 NOTE — H&P (Addendum)
History and Physical        Hospital Admission Note Date: 05/17/2016  Patient name: Jill Johnston Medical record number: [id number] Date of birth: 04-24-1966 Age: 50 y.o. Gender: female  PCP: DAROVSKY,BORIS, MD   Referring physician: Dr Coralyn Pear   Patient coming from: home    Chief Complaint:  fever this morning  HPI: Patient is a 50 year old female with metastatic left-sided breast cancer, metastatic to liver, lungs and bones, GERD, hypertension presented with reports of fever of 100.5 F this morning. Otherwise patient had no subjective symptoms of nausea, vomiting, productive cough, abdominal pain, diarrhea. Patient is being treated for stage IV breast cancer, widely metastatic. She received her chemotherapy on Friday on 11/17. No change in appetite. ED work-up/course:  BMET unremarkable. AST 105, ALT 167, total bilirubin 105. The PVCs 4.0, hemoglobin 9.8, hematocrit 30.1. Chest x-ray showed increasing bibasilar atelectasis or infiltrates, cannot exclude pneumonia particularly in the right base. Vital signs showed tachycardia with heart rate ranging from 117-140 Temp 98.1 and respiratory rate 24, BP 100/76, O2 sats 90% on room air  Review of Systems: Positives marked in 'bold' Constitutional: +  fever, chills, diaphoresis, no poor appetite and fatigue.  HEENT: Denies photophobia, eye pain, redness, hearing loss, ear pain, congestion, sore throat, rhinorrhea, sneezing, mouth sores, trouble swallowing, neck pain, neck stiffness and tinnitus.   Respiratory: Denies SOB, DOE, cough, chest tightness,  and wheezing.   Cardiovascular: Denies chest pain, palpitations and leg swelling.  Gastrointestinal: Denies nausea, vomiting, abdominal pain, diarrhea, constipation, blood in stool and abdominal distention.  Genitourinary: Denies dysuria, urgency, frequency, hematuria, flank  pain and difficulty urinating.  Musculoskeletal: Denies myalgias, back pain, joint swelling, arthralgias and gait problem.  Skin: Denies pallor, rash and wound.  Neurological: Denies dizziness, seizures, syncope, weakness, light-headedness, numbness and headaches.  Hematological: Denies adenopathy. Easy bruising, personal or family bleeding history  Psychiatric/Behavioral: Denies suicidal ideation, mood changes, confusion, nervousness, sleep disturbance and agitation  Past Medical History: Past Medical History:  Diagnosis Date  . Anxiety   . Breast cancer ([place])    Stage IV, ER positive left upper outer quadrant breast cancer with metastasis to bone  . Breast cancer metastasized to multiple sites ([place]) 04/12/2016   bone, liver, lung and brain  . GERD (gastroesophageal reflux disease)   . Hypertension     Past Surgical History:  Procedure Laterality Date  . CHOLECYSTECTOMY    . ESOPHAGOGASTRODUODENOSCOPY (EGD) WITH PROPOFOL N/A 07/12/2015   Procedure: ESOPHAGOGASTRODUODENOSCOPY (EGD) WITH PROPOFOL;  Surgeon: Rogene Houston, MD;  Location: [facility];  Service: Endoscopy;  Laterality: N/A;  1:00  . IR GENERIC HISTORICAL  04/22/2016   IR US GUIDE VASC ACCESS RIGHT 04/22/2016 Markus Daft, MD [facility]  . IR GENERIC HISTORICAL  04/22/2016   IR FLUORO GUIDE PORT INSERTION RIGHT 04/22/2016 Markus Daft, MD [facility]  . MASTECTOMY  April 2009   Left breast with lymph node resection    Medications: Prior to Admission medications   Medication Sig Start Date End Date Taking? Authorizing Provider  acetaminophen (TYLENOL) 500 MG tablet Take 1,000 mg by mouth every 6 (six) hours as needed.   Yes Historical Provider, MD  diphenhydrAMINE (BENADRYL) 25 mg capsule Take 25 mg by mouth at bedtime as needed.   Yes Historical Provider, MD  docusate sodium (COLACE) 100 MG capsule Take 100 mg by mouth daily.    Yes Historical Provider, MD  fluconazole (DIFLUCAN) 100 MG tablet Take 1 tablet (100 mg  total) by mouth daily as needed (for thrush). 05/08/16  Yes Lennis Marion Downer, MD  ibuprofen (ADVIL,MOTRIN) 200 MG tablet Take 400 mg by mouth daily as needed.   Yes Historical Provider, MD  lidocaine-prilocaine (EMLA) cream Apply 1 application topically as needed. To port 1 hour before going to be accessed with needle. Cover with plastic wrap. 04/17/16  Yes Lennis Marion Downer, MD  lisinopril (PRINIVIL,ZESTRIL) 10 MG tablet Take 1 tablet (10 mg total) by mouth daily. 01/23/16  Yes Lennis Marion Downer, MD  oxyCODONE (OXYCONTIN) 30 MG 12 hr tablet Take 30 mg by mouth 2 (two) times daily. 05/08/16  Yes Lennis Marion Downer, MD  pantoprazole (PROTONIX) 40 MG tablet Take 1 tablet (40 mg total) by mouth 2 (two) times daily before a meal. 01/06/16  Yes Butch Penny, NP  polyethylene glycol (MIRALAX / GLYCOLAX) packet Take 17 grams by mouth every day. 10/11/14  Yes Historical Provider, MD  chlorhexidine (PERIDEX) 0.12 % solution Use as directed 15 mLs in the mouth or throat 3 (three) times daily. Patient not taking: Reported on 05/17/2016 04/24/16   Gordy Levan, MD  nystatin (MYCOSTATIN) 100000 UNIT/ML suspension Take 5 mLs (500,000 Units total) by mouth 4 (four) times daily. Patient not taking: Reported on 05/17/2016 04/17/16   Florencia Reasons, MD  ondansetron (ZOFRAN) 8 MG tablet Take 1 tablet (8 mg total) by mouth every 8 (eight) hours as needed for nausea or vomiting. Patient not taking: Reported on 05/17/2016 04/21/16   Gordy Levan, MD  Oxycodone HCl 10 MG TABS Take 1 tablet by mouth every 3 (three) hours as needed for pain. 11/29/15   Historical Provider, MD    Allergies:  No Known Allergies  Social History:  reports that she has never smoked. She has never used smokeless tobacco. She reports that she does not drink alcohol or use drugs.  Family History: Family History  Problem Relation Age of Onset  . Heart attack Mother     Died age 40  . Heart disease Father     Died age 80  . Brain cancer Father      pt not sure if he actually had this  . Cancer Maternal Grandmother     dx. NOS cancer at older age; d. 16  . Heart attack Maternal Grandfather 79  . Colon cancer Paternal Grandmother     d. 105s    Physical Exam: Blood pressure 100/76, pulse 117, temperature 98.1 F (36.7 C), temperature source Oral, resp. rate 24, height 5\' 5"  (1.651 m), weight 70.8 kg (156 lb), SpO2 91 %. General: Alert, awake, oriented x3, in no acute distress. HEENT: normocephalic, atraumatic, anicteric sclera, pink conjunctiva, pupils equal and reactive to light and accomodation, oropharynx clear Neck: supple, no masses or lymphadenopathy, no goiter, no bruits  Heart: Regular rate and rhythm, without murmurs, rubs or gallops. Lungs: Clear to auscultation bilaterally, no wheezing, rales or rhonchi. Abdomen: Soft, nontender, nondistended, positive bowel sounds, no masses. Extremities: No clubbing, cyanosis or edema with positive pedal pulses. Neuro: Grossly intact, no focal neurological deficits, strength 5/5 upper and lower extremities bilaterally Psych: alert and oriented x 3, normal mood and affect Skin: no rashes or  lesions, warm and dry   LABS on Admission:  Basic Metabolic Panel:  Recent Labs Lab 05/15/16 0933 05/17/16 0852  NA 138 136  K 3.9 3.5  CL  --  105  CO2 25 26  GLUCOSE 138 154*  BUN 11.2 11  CREATININE 0.7 0.65  CALCIUM 9.0 8.1*   Liver Function Tests:  Recent Labs Lab 05/15/16 0933 05/17/16 0852  AST 89* 105*  ALT 213* 167*  ALKPHOS 196* 125  BILITOT 0.65 1.5*  PROT 5.9* 5.4*  ALBUMIN 2.6* 2.7*   No results for input(s): LIPASE, AMYLASE in the last 168 hours. No results for input(s): AMMONIA in the last 168 hours. CBC:  Recent Labs Lab 05/15/16 0933 05/17/16 0852  WBC 2.4* 4.0  NEUTROABS 1.4* 3.6  HGB 10.9* 9.8*  HCT 34.3* 30.1*  MCV 104.9* 105.2*  PLT 107* 130*   Cardiac Enzymes: No results for input(s): CKTOTAL, CKMB, CKMBINDEX, TROPONINI in the last 168  hours. BNP: Invalid input(s): POCBNP CBG: No results for input(s): GLUCAP in the last 168 hours.  Radiological Exams on Admission:  No results found.  *I have personally reviewed the images above*      Assessment/Plan Principal Problem:   HCAP (healthcare-associated pneumonia) In the setting of metastatic breast cancer and received chemotherapy 2 days ago - Admitted for close monitoring and treat as HCAP - Placed on IV vancomycin and cefepime  - Obtain blood cultures, urine strep antigen, urine legionella Ag, IV fluids  Active Problems:   Gastroesophageal reflux disease - Continue PPI    Carcinoma of left breast metastatic to multiple sites [facility]) - will continue OxyContin and oxycodone due to bony metastases and for pain control    Essential hypertension - cont lisinopril    Chemotherapy-induced thrombocytopenia - Monitor counts closely  Tachycardia - Sinus tachycardia on the monitor, likely due to #1, rule out PE, obtain d-dimer, if positive will obtain CT angiogram of the chest ruled out pulmonary embolism - Obtain EKG  DVT prophylaxis: heparin sq  CODE STATUS: full code   Consults called: none   Family Communication: Admission, patients condition and plan of care including tests being ordered have been discussed with the patient and husband. who indicates understanding and agree with the plan and Code Status  Admission status: inpatient tele  Disposition plan: Further plan will depend as patient's clinical course evolves and further radiologic and laboratory data become available.   At the time of admission, it appears that the appropriate admission status for this patient is INPATIENT . This is judged to be reasonable and necessary in order to provide the required intensity of service to ensure the patient's safety given the presenting symptoms, physical exam findings, and initial radiographic and laboratory data in the context of their chronic comorbidities.      Addendum:4:50PM  CTA chest reviewed: No pulmonary embolism. Areas of groundglass opacity in both upper lobes and in lingular regions likely pneumonia. Persistent consolidation in the lateral segment right middle lobe consistent with pneumonia. New small pleural effusions bilaterally with patchy atelectasis at the lung bases. Recommended follow-up study in 4-6 weeks. Extensive bony metastatic disease  Updated the above to the patient  Time Spent on Admission: 64 minutes  RAI,RIPUDEEP M.D. [facility] 05/17/2016, 12:12 PM Pager: [phone]  If 7PM-7AM, please contact night-coverage [email or web address] Password [email or web address]

## 2016-05-17 NOTE — ED Notes (Signed)
Bed: [id number] Expected date:  Expected time:  Means of arrival:  Comments: rsus B

## 2016-05-17 NOTE — ED Notes (Signed)
Pt refusing the second set of blood cultures.  She doesn't mind the blood work - she doesn't want to be stuck - we can only use her port.

## 2016-05-17 NOTE — ED Triage Notes (Signed)
Patient reports getting her chemo on Friday Reports fever starting this morning. Denies nausea, vomiting, diarrhea. Patient took extra strength tylenol at 6:45. Patient is being treated for stage 4 breast cancer.

## 2016-05-18 ENCOUNTER — Ambulatory Visit: Payer: BLUE CROSS/BLUE SHIELD

## 2016-05-18 DIAGNOSIS — C7951 Secondary malignant neoplasm of bone: Secondary | ICD-10-CM

## 2016-05-18 DIAGNOSIS — C787 Secondary malignant neoplasm of liver and intrahepatic bile duct: Secondary | ICD-10-CM

## 2016-05-18 DIAGNOSIS — C50212 Malignant neoplasm of upper-inner quadrant of left female breast: Secondary | ICD-10-CM

## 2016-05-18 DIAGNOSIS — C7931 Secondary malignant neoplasm of brain: Secondary | ICD-10-CM

## 2016-05-18 DIAGNOSIS — C7800 Secondary malignant neoplasm of unspecified lung: Secondary | ICD-10-CM

## 2016-05-18 DIAGNOSIS — J189 Pneumonia, unspecified organism: Principal | ICD-10-CM

## 2016-05-18 LAB — URINE CULTURE

## 2016-05-18 LAB — CBC
HEMATOCRIT: 24.8 % — AB (ref 36.0–46.0)
HEMOGLOBIN: 8.2 g/dL — AB (ref 12.0–15.0)
MCH: 34.5 pg — ABNORMAL HIGH (ref 26.0–34.0)
MCHC: 33.1 g/dL (ref 30.0–36.0)
MCV: 104.2 fL — AB (ref 78.0–100.0)
Platelets: 122 10*3/uL — ABNORMAL LOW (ref 150–400)
RBC: 2.38 MIL/uL — AB (ref 3.87–5.11)
RDW: 14.7 % (ref 11.5–15.5)
WBC: 2.8 10*3/uL — AB (ref 4.0–10.5)

## 2016-05-18 LAB — BASIC METABOLIC PANEL
ANION GAP: 6 (ref 5–15)
BUN: 9 mg/dL (ref 6–20)
CHLORIDE: 106 mmol/L (ref 101–111)
CO2: 24 mmol/L (ref 22–32)
Calcium: 7.5 mg/dL — ABNORMAL LOW (ref 8.9–10.3)
Creatinine, Ser: 0.62 mg/dL (ref 0.44–1.00)
GFR calc Af Amer: >60 mL/min (ref >60)
GFR calc non Af Amer: >60 mL/min (ref >60)
Glucose, Bld: 136 mg/dL — ABNORMAL HIGH (ref 65–99)
POTASSIUM: 3.4 mmol/L — AB (ref 3.5–5.1)
SODIUM: 136 mmol/L (ref 135–145)

## 2016-05-18 LAB — HIV ANTIBODY (ROUTINE TESTING W REFLEX): HIV SCREEN 4TH GENERATION: NONREACTIVE

## 2016-05-18 MED ORDER — BOOST PLUS PO LIQD
237.0000 mL | Freq: Two times a day (BID) | ORAL | Status: DC
  Administered 2016-05-19 – 2016-05-20 (×2): 237 mL via ORAL
  Filled 2016-05-18 (×5): qty 237

## 2016-05-18 MED ORDER — POTASSIUM CHLORIDE 20 MEQ/15ML (10%) PO SOLN
20.0000 meq | Freq: Once | ORAL | Status: AC
  Administered 2016-05-18: 20 meq via ORAL
  Filled 2016-05-18: qty 15

## 2016-05-18 MED ORDER — POTASSIUM CHLORIDE CRYS ER 20 MEQ PO TBCR
20.0000 meq | EXTENDED_RELEASE_TABLET | Freq: Once | ORAL | Status: DC
  Filled 2016-05-18: qty 1

## 2016-05-18 MED ORDER — SODIUM CHLORIDE 0.9 % IV SOLN
INTRAVENOUS | Status: DC
  Administered 2016-05-19: 07:00:00 via INTRAVENOUS

## 2016-05-18 NOTE — Progress Notes (Signed)
PROGRESS NOTE  Jill Johnston  [id number] DOB: 1965-07-24 DOA: 05/17/2016 PCP: Audree Camel, MD  Outpatient Specialists: Oncology, Dr. Jana Hakim  Brief Narrative: Jill Johnston is a 50 y.o. 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 Though she denied symptoms including chest pain, dyspnea, cough, abd pain, N/V/D, dysuria, and wounds, she had an oral temperature of 100.52F at home. She last received chemotherapy 11/17. On arrival, the patient appeared chronically ill but in no distress, tachycardic 117-140, saturating 90% on room air. Hgb 9.8, WBC 4 (90% PMNs). LFTs were abnormal with TBili 1.5, AST/ALT 105/167, albumin 2.7, otherwise BMP unremarkable. Lactate was 1.39. UA with many bacteria, but 0-5 WBCs. CXR showed bibasilar atelectasis. D-dimer was elevated at 2.46, so CTA chest was obtained showing bilateral upper lobe GGOs, and RML consolidation without PE.  IV vancomycin and cefepime were started, blood cultures wer obtained and she was admitted for treatment of HCAP.   Assessment & Plan: Principal Problem:   HCAP (healthcare-associated pneumonia) Active Problems:   Gastroesophageal reflux disease   Drug-induced leukopenia ([facility])   Carcinoma of left breast metastatic to multiple sites [facility])   Essential hypertension   Chemotherapy-induced thrombocytopenia  HCAP in the setting of metastatic breast cancer undergoing chemotherapy: RML and lingular infiltrates noted in addition to bilateral upper lobe GGOs on CTA chest. HIV neg. Urine strep Ag neg.  - Admitted for close monitoring given immunocompromised state.  - Continue Vancomycin and cefepime (11/19 >> ) - Blood cultures (11/19) NGTD - Lactate 1.39 > 1.4  Widely metastatic left breast cancer: - Will continue OxyContin and oxycodone due to bony metastases and for pain control - Dr. Jana Hakim added to treatment team.  Gastroesophageal  reflux disease - Continue PPI  Essential hypertension - cont lisinopril  Chemotherapy-induced pancytopenia: WBC and hgb trending downward with fluid resuscitation.  - Monitor counts closely - SCDs for DVT Ppx  Sinus tachycardia: Not acute, seen in office visit notes. PE ruled out.  - Continue to monitor.   Hypokalemia: K 3.4 11/20 > replete and recheck in AM.   DVT prophylaxis: SCDs due to thrombocytopenia Code Status: Full Family Communication: Husband at bedside Disposition Plan: Inpatient treatment with IV antibiotics.   Consultants:   None  Procedures:   None  Antimicrobials:  Vancomycin (11/19 >> )  Cefepime (11/19 >> )   Subjective: Breathing is "fine" and she wants to go home. Required oxygen this morning for SpO2 87%. Ambulating. Poor appetite. No N/V. No chest pain. No cough.   Objective: Vitals:   05/17/16 1810 05/17/16 2107 05/18/16 0608 05/18/16 1345  BP:  125/80 106/87 96/67  Pulse:  (!) 137 (!) 135 (!) 110  Resp:  20 20 18   Temp: (!) 100.7 F (38.2 C) 97.7 F (36.5 C) 98 F (36.7 C) 100.1 F (37.8 C)  TempSrc: Axillary Axillary Axillary Axillary  SpO2:  92% 98% 96%  Weight:      Height:        Intake/Output Summary (Last 24 hours) at 05/18/16 1527 Last data filed at 05/18/16 1359  Gross per 24 hour  Intake          2208.75 ml  Output                0 ml  Net          2208.75 ml   Filed Weights   05/17/16 0808 05/17/16 1235  Weight: 70.8  kg (156 lb) 70.8 kg (156 lb)    Examination: General exam: Chronically ill-appearing 50 y.o. female in no distress Respiratory system: Mildly labored breathing room air, 97%. Clear to auscultation bilaterally.  Cardiovascular system: Regular rate and rhythm. No murmur, rub, or gallop. No JVD, and no pedal edema. Gastrointestinal system: Abdomen soft, non-tender, non-distended, with normoactive bowel sounds. No organomegaly or masses felt. Central nervous system: Alert and oriented. No focal  neurological deficits. Extremities: Warm, no deformities Skin: No rashes, lesions no ulcers Psychiatry: Judgement and insight appear normal. Mood & affect appropriate.   Data Reviewed: I have personally reviewed following labs and imaging studies  CBC:  Recent Labs Lab 05/15/16 0933 05/17/16 0852 05/17/16 1300 05/18/16 0510  WBC 2.4* 4.0 3.0* 2.8*  NEUTROABS 1.4* 3.6  --   --   HGB 10.9* 9.8* 9.0* 8.2*  HCT 34.3* 30.1* 27.4* 24.8*  MCV 104.9* 105.2* 103.0* 104.2*  PLT 107* 130* 121* [id number]*   Basic Metabolic Panel:  Recent Labs Lab 05/15/16 0933 05/17/16 0852 05/17/16 1300 05/18/16 0510  NA 138 136  --  136  K 3.9 3.5  --  3.4*  CL  --  105  --  106  CO2 25 26  --  24  GLUCOSE 138 154*  --  136*  BUN 11.2 11  --  9  CREATININE 0.7 0.65 0.74 0.62  CALCIUM 9.0 8.1*  --  7.5*   GFR: Estimated Creatinine Clearance: 83 mL/min (by C-G formula based on SCr of 0.62 mg/dL). Liver Function Tests:  Recent Labs Lab 05/15/16 0933 05/17/16 0852  AST 89* 105*  ALT 213* 167*  ALKPHOS 196* 125  BILITOT 0.65 1.5*  PROT 5.9* 5.4*  ALBUMIN 2.6* 2.7*   No results for input(s): LIPASE, AMYLASE in the last 168 hours. No results for input(s): AMMONIA in the last 168 hours. Coagulation Profile: No results for input(s): INR, PROTIME in the last 168 hours. Cardiac Enzymes: No results for input(s): CKTOTAL, CKMB, CKMBINDEX, TROPONINI in the last 168 hours. BNP (last 3 results) No results for input(s): PROBNP in the last 8760 hours. HbA1C: No results for input(s): HGBA1C in the last 72 hours. CBG: No results for input(s): GLUCAP in the last 168 hours. Lipid Profile: No results for input(s): CHOL, HDL, LDLCALC, TRIG, CHOLHDL, LDLDIRECT in the last 72 hours. Thyroid Function Tests: No results for input(s): TSH, T4TOTAL, FREET4, T3FREE, THYROIDAB in the last 72 hours. Anemia Panel: No results for input(s): VITAMINB12, FOLATE, FERRITIN, TIBC, IRON, RETICCTPCT in the last 72  hours. Urine analysis:    Component Value Date/Time   COLORURINE ORANGE (A) 05/17/2016 0852   APPEARANCEUR CLOUDY (A) 05/17/2016 0852   LABSPEC 1.027 05/17/2016 0852   PHURINE 6.0 05/17/2016 0852   GLUCOSEU NEGATIVE 05/17/2016 0852   HGBUR NEGATIVE 05/17/2016 0852   BILIRUBINUR SMALL (A) 05/17/2016 0852   KETONESUR NEGATIVE 05/17/2016 0852   PROTEINUR 30 (A) 05/17/2016 0852   NITRITE NEGATIVE 05/17/2016 0852   LEUKOCYTESUR TRACE (A) 05/17/2016 0852   Sepsis Labs: @LABRCNTIP (procalcitonin:4,lacticidven:4)  ) Recent Results (from the past 240 hour(s))  TECHNOLOGIST REVIEW     Status: None   Collection Time: 05/15/16  9:33 AM  Result Value Ref Range Status   Technologist Review Metas and Myelocytes present. 2% nrbc.  Final  Culture, blood (Routine X 2)     Status: None (Preliminary result)   Collection Time: 05/17/16  8:50 AM  Result Value Ref Range Status   Specimen Description BLOOD PORTA CATH  Final   Special Requests BOTTLES DRAWN AEROBIC AND ANAEROBIC 10CC  Final   Culture   Final    NO GROWTH < 24 HOURS Performed at [facility]    Report Status PENDING  Incomplete  Urine culture     Status: Abnormal   Collection Time: 05/17/16  8:52 AM  Result Value Ref Range Status   Specimen Description URINE, RANDOM  Final   Special Requests NONE  Final   Culture MULTIPLE SPECIES PRESENT, SUGGEST RECOLLECTION (A)  Final   Report Status 05/18/2016 FINAL  Final  Culture, blood (routine x 2) Call MD if unable to obtain prior to antibiotics being given     Status: None (Preliminary result)   Collection Time: 05/17/16  1:07 PM  Result Value Ref Range Status   Specimen Description BLOOD RIGHT ANTECUBITAL  Final   Special Requests IN PEDIATRIC BOTTLE [place]  Final   Culture   Final    NO GROWTH < 24 HOURS Performed at [facility]    Report Status PENDING  Incomplete     Radiology Studies: Dg Chest 2 View  Result Date: 05/17/2016 CLINICAL DATA:  Fever.  Breast  cancer, chemotherapy patient. EXAM: CHEST  2 VIEW COMPARISON:  05/04/2016 FINDINGS: Right Port-A-Cath is in place with the tip in the SVC. Increasing bibasilar airspace opacities since prior study which could represent atelectasis or infiltrate. Area most concerning for pneumonia is in the right lung base. No effusions. Heart is normal size. IMPRESSION: Increasing bibasilar atelectasis or infiltrates. Cannot exclude pneumonia, particularly in the right base. Electronically Signed   By: Rolm Baptise M.D.   On: 05/17/2016 08:47   Ct Angio Chest Pe W Or Wo Contrast  Result Date: 05/17/2016 CLINICAL DATA:  History of left-sided breast carcinoma. Presenting with fever and shortness of breath. Tachycardia. EXAM: CT ANGIOGRAPHY CHEST WITH CONTRAST TECHNIQUE: Multidetector CT imaging of the chest was performed using the standard protocol during bolus administration of intravenous contrast. Multiplanar CT image reconstructions and MIPs were obtained to evaluate the vascular anatomy. CONTRAST:  50 mL Isovue 370 nonionic COMPARISON:  Chest CT angiogram May 04, 2016; chest radiograph May 17, 2016 FINDINGS: Cardiovascular: There is no demonstrable pulmonary embolus. There is no thoracic aortic aneurysm or dissection. The visualized great vessels appear normal. There is slight calcification in the aorta. Pericardium is not appreciably thickened. Port-A-Cath tip is in the superior vena cava. Mediastinum/Nodes: Visualized thyroid appears unremarkable. There is no appreciable adenopathy in the thoracic region. Lungs/Pleura: Multiple foci of ground-glass type opacity are noted throughout both upper lobes, a change from recent prior study. There is a stable 6 mm nodular opacity in the posterior right apex, seen currently on axial slice 14 series 10. There is focal airspace consolidation in the lateral segment of the right middle lobe. There is atelectatic change in both lower lobes. There is a new small pleural effusion  on each side. There is atelectatic change in both lung bases, increased from recent prior study. There is patchy ground-glass opacity noted in the periphery of the lingula, primarily in the inferior segment, a change from recent study. Upper Abdomen: In the visualized upper abdomen, there is widespread liver metastatic disease. Gallbladder is absent. Visualized upper abdominal structures appear unremarkable elsewhere. Musculoskeletal: There is widespread sclerotic metastatic disease throughout multiple sites in the thoracic and lower cervical spine. There is sclerotic metastatic disease in the superior right scapula as well as in several ribs bilaterally. Review of the MIP images confirms the  above findings. IMPRESSION: No demonstrable pulmonary embolus. Areas of ground-glass opacity in both upper lobes and in the lingular regions, likely pneumonia. There is also persistent consolidation in the lateral segment right middle lobe, consistent with pneumonia. There are new small pleural effusions bilaterally with patchy atelectasis in lung bases. It should be noted that at least some of the areas of ground-glass opacity in the upper lung regions potentially could represent a degree of lymphangitic spread of tumor. In this regard, a follow-up study in 4-6 weeks may be reasonable to further assess these changes in the lungs. Extensive bony metastatic disease as well as extensive lower metastatic disease noted. Electronically Signed   By: Lowella Grip III M.D.   On: 05/17/2016 15:29    Scheduled Meds: . ceFEPime (MAXIPIME) IV  2 g Intravenous Q8H  . docusate sodium  100 mg Oral Daily  . lactose free nutrition  237 mL Oral BID BM  . lisinopril  10 mg Oral Daily  . oxyCODONE  30 mg Oral 2 times per day  . pantoprazole  40 mg Oral BID AC  . polyethylene glycol  17 g Oral Daily  . vancomycin  750 mg Intravenous Q8H   Continuous Infusions: . sodium chloride 75 mL/hr at 05/18/16 0231     LOS: 1 day   Time  spent: 25 minutes.  Vance Gather, MD [facility] Pager [phone]  If 7PM-7AM, please contact night-coverage [email or web address] Password [facility] 05/18/2016, 3:27 PM

## 2016-05-18 NOTE — Progress Notes (Signed)
PT Cancellation Note  Patient Details Name: Jill Johnston MRN: [id number] DOB: May 25, 1966   Cancelled Treatment:    Reason Eval/Treat Not Completed: PT screened, no needs identified, will sign off. Spoke with pt and family member. Pt denied need for PT services. Both report she has been mobilzing in room without difficulty. Will sign off at pt's request. Thanks.    Weston Anna, MPT Pager: [phone]

## 2016-05-18 NOTE — Progress Notes (Signed)
Initial Nutrition Assessment  DOCUMENTATION CODES:   Not applicable  INTERVENTION:  Provide Boost Plus po BID mixed with ice cream. Each Boost Plus provides 360 kcal and 14 grams protein.  Reviewed High-Calorie, High-Protein Medical Nutrition Therapy with patient and husband.   NUTRITION DIAGNOSIS:   Increased nutrient needs related to catabolic illness, cancer and cancer related treatments as evidenced by estimated needs.  GOAL:   Patient will meet greater than or equal to 90% of their needs  MONITOR:   PO intake, Supplement acceptance, Labs, Weight trends, I & O's  REASON FOR ASSESSMENT:   Malnutrition Screening Tool    ASSESSMENT:   50 year old female with metastatic left-sided breast cancer, metastatic to liver, lungs and bones, GERD, hypertension presented with reports of fever of 100.5 F the morning of admission. Patient is being treated for stage IV breast cancer, widely metastatic. She received her chemotherapy on Friday on 11/17. Found to have HCAP.    Spoke with patient and husband at bedside. Patient reports her appetite has been poor today and yesterday due to fever, but prior to that was good. She typically eats 4-5 meals per day that she prepares herself. Yesterday she only had 2 meals. Patient denies N/V, abdominal pain, or constipation/diarrhea.  Reports UBW is 160 lbs and that she has had some gradual weight loss. Per chart, patient lost 8 lbs (5% body weight) over 3 weeks (9/25-10/19), which is significant for time frame. Since then she has gained some weight back to 156 lbs, which is still below UBW.  At first patient reported she was not amenable to drinki1ng an ONS as she does not like Ensure. After discussion regarding increased protein and calorie needs and importance to meet these needs to prevent further weight loss or loss of lean body mass, patient amenable to trying Boost Plus with ice cream.  Meal Completion: 25% of dinner last night per  chart.  Medications reviewed and include: Colace, pantoprazole, Miralax, NS @ 75 ml/hr.  Labs reviewed: Potassium 3.4, Glucose 136.  Nutrition-Focused physical exam completed. Findings are no fat depletion, no muscle depletion, and mild edema.   Discussed with RN.   Diet Order:  Diet regular Room service appropriate? Yes; Fluid consistency: Thin  Skin:  Reviewed, no issues  Last BM:  05/17/2016  Height:   Ht Readings from Last 1 Encounters:  05/17/16 5\' 5"  (1.651 m)    Weight:   Wt Readings from Last 1 Encounters:  05/17/16 156 lb (70.8 kg)    Ideal Body Weight:  56.82 kg  BMI:  Body mass index is 25.96 kg/m.  Estimated Nutritional Needs:   Kcal:  1900-2100 (27-30 kcal/kg)  Protein:  85-105 grams (1.2-1.5 grams/kg)  Fluid:  >/= 1.9 L/day  EDUCATION NEEDS:   Education needs addressed (Importance of adequate calories and protein. High-Protein High-Calorie MNT.)  Willey Blade, MS, RD, LDN Pager: [phone] After Hours Pager: [phone]

## 2016-05-18 NOTE — Progress Notes (Signed)
Jill Johnston   DOB:11-29-1965   TA#:[id number]   KPV#:[id number]  Subjective: she was sleeping when I dropped in, "they didn't let me sleep last night." Has been OOBTC abd walked to BR she says w/o difficulty. Denies worsening SOB. What bothers her is swelling, which she blames on IVF. Husband in room   Objective: middle aged White woman examined in bed Vitals:   05/18/16 0608 05/18/16 1345  BP: 106/87 96/67  Pulse: (!) 135 (!) 110  Resp: 20 18  Temp: 98 F (36.7 C) 100.1 F (37.8 C)    Body mass index is 25.96 kg/m.  Intake/Output Summary (Last 24 hours) at 05/18/16 1802 Last data filed at 05/18/16 1359  Gross per 24 hour  Intake             1575 ml  Output                0 ml  Net             1575 ml     Lungs no rhonchi or wheezes--auscultated anterolaterally  Heart regular rate and rhythm  Abdomen soft, +BS  Neuro nonfocal   CBG (last 3)  No results for input(s): GLUCAP in the last 72 hours.   Labs:  Results for Jill Johnston (MRN [id number]) as of 05/18/2016 18:04  Ref. Range 04/24/2016 09:58 05/04/2016 13:04 05/08/2016 10:25 05/15/2016 09:33 05/17/2016 08:52  NEUT# Latest Ref Range: 1.7 - 7.7 K/uL 6.4 3.3 1.3 (L) 1.4 (L) 3.6    Lab Results  Component Value Date   WBC 2.8 (L) 05/18/2016   HGB 8.2 (L) 05/18/2016   HCT 24.8 (L) 05/18/2016   MCV 104.2 (H) 05/18/2016   PLT 122 (L) 05/18/2016   NEUTROABS 3.6 05/17/2016    @LASTCHEMISTRY @  Urine Studies No results for input(s): UHGB, CRYS in the last 72 hours.  Invalid input(s): UACOL, UAPR, USPG, UPH, UTP, UGL, UKET, UBIL, UNIT, UROB, ULEU, UEPI, UWBC, URBC, UBAC, CAST, [place], [place]  Basic Metabolic Panel:  Recent Labs Lab 05/15/16 0933 05/17/16 0852 05/17/16 1300 05/18/16 0510  NA 138 136  --  136  K 3.9 3.5  --  3.4*  CL  --  105  --  106  CO2 25 26  --  24  GLUCOSE 138 154*  --  136*  BUN 11.2 11  --  9  CREATININE 0.7 0.65 0.74 0.62  CALCIUM 9.0 8.1*  --  7.5*   GFR Estimated Creatinine  Clearance: 83 mL/min (by C-G formula based on SCr of 0.62 mg/dL). Liver Function Tests:  Recent Labs Lab 05/15/16 0933 05/17/16 0852  AST 89* 105*  ALT 213* 167*  ALKPHOS 196* 125  BILITOT 0.65 1.5*  PROT 5.9* 5.4*  ALBUMIN 2.6* 2.7*   No results for input(s): LIPASE, AMYLASE in the last 168 hours. No results for input(s): AMMONIA in the last 168 hours. Coagulation profile No results for input(s): INR, PROTIME in the last 168 hours.  CBC:  Recent Labs Lab 05/15/16 0933 05/17/16 0852 05/17/16 1300 05/18/16 0510  WBC 2.4* 4.0 3.0* 2.8*  NEUTROABS 1.4* 3.6  --   --   HGB 10.9* 9.8* 9.0* 8.2*  HCT 34.3* 30.1* 27.4* 24.8*  MCV 104.9* 105.2* 103.0* 104.2*  PLT 107* 130* 121* 122*   Cardiac Enzymes: No results for input(s): CKTOTAL, CKMB, CKMBINDEX, TROPONINI in the last 168 hours. BNP: Invalid input(s): POCBNP CBG: No results for input(s): GLUCAP in the last 168 hours. D-Dimer  Recent Labs  05/17/16 0852  DDIMER 2.46*   Hgb A1c No results for input(s): HGBA1C in the last 72 hours. Lipid Profile No results for input(s): CHOL, HDL, LDLCALC, TRIG, CHOLHDL, LDLDIRECT in the last 72 hours. Thyroid function studies No results for input(s): TSH, T4TOTAL, T3FREE, THYROIDAB in the last 72 hours.  Invalid input(s): FREET3 Anemia work up No results for input(s): VITAMINB12, FOLATE, FERRITIN, TIBC, IRON, RETICCTPCT in the last 72 hours. Microbiology Recent Results (from the past 240 hour(s))  TECHNOLOGIST REVIEW     Status: None   Collection Time: 05/15/16  9:33 AM  Result Value Ref Range Status   Technologist Review Metas and Myelocytes present. 2% nrbc.  Final  Culture, blood (Routine X 2)     Status: None (Preliminary result)   Collection Time: 05/17/16  8:50 AM  Result Value Ref Range Status   Specimen Description BLOOD PORTA CATH  Final   Special Requests BOTTLES DRAWN AEROBIC AND ANAEROBIC 10CC  Final   Culture   Final    NO GROWTH < 24 HOURS Performed at  [facility]    Report Status PENDING  Incomplete  Urine culture     Status: Abnormal   Collection Time: 05/17/16  8:52 AM  Result Value Ref Range Status   Specimen Description URINE, RANDOM  Final   Special Requests NONE  Final   Culture MULTIPLE SPECIES PRESENT, SUGGEST RECOLLECTION (A)  Final   Report Status 05/18/2016 FINAL  Final  Culture, blood (routine x 2) Call MD if unable to obtain prior to antibiotics being given     Status: None (Preliminary result)   Collection Time: 05/17/16  1:07 PM  Result Value Ref Range Status   Specimen Description BLOOD RIGHT ANTECUBITAL  Final   Special Requests IN PEDIATRIC BOTTLE [place]  Final   Culture   Final    NO GROWTH < 24 HOURS Performed at [facility]    Report Status PENDING  Incomplete      Studies:  Dg Chest 2 View  Result Date: 05/17/2016 CLINICAL DATA:  Fever.  Breast cancer, chemotherapy patient. EXAM: CHEST  2 VIEW COMPARISON:  05/04/2016 FINDINGS: Right Port-A-Cath is in place with the tip in the SVC. Increasing bibasilar airspace opacities since prior study which could represent atelectasis or infiltrate. Area most concerning for pneumonia is in the right lung base. No effusions. Heart is normal size. IMPRESSION: Increasing bibasilar atelectasis or infiltrates. Cannot exclude pneumonia, particularly in the right base. Electronically Signed   By: Rolm Baptise M.D.   On: 05/17/2016 08:47   Ct Angio Chest Pe W Or Wo Contrast  Result Date: 05/17/2016 CLINICAL DATA:  History of left-sided breast carcinoma. Presenting with fever and shortness of breath. Tachycardia. EXAM: CT ANGIOGRAPHY CHEST WITH CONTRAST TECHNIQUE: Multidetector CT imaging of the chest was performed using the standard protocol during bolus administration of intravenous contrast. Multiplanar CT image reconstructions and MIPs were obtained to evaluate the vascular anatomy. CONTRAST:  50 mL Isovue 370 nonionic COMPARISON:  Chest CT angiogram May 04, 2016; chest radiograph May 17, 2016 FINDINGS: Cardiovascular: There is no demonstrable pulmonary embolus. There is no thoracic aortic aneurysm or dissection. The visualized great vessels appear normal. There is slight calcification in the aorta. Pericardium is not appreciably thickened. Port-A-Cath tip is in the superior vena cava. Mediastinum/Nodes: Visualized thyroid appears unremarkable. There is no appreciable adenopathy in the thoracic region. Lungs/Pleura: Multiple foci of ground-glass type opacity are noted throughout both upper lobes, a change from recent  prior study. There is a stable 6 mm nodular opacity in the posterior right apex, seen currently on axial slice 14 series 10. There is focal airspace consolidation in the lateral segment of the right middle lobe. There is atelectatic change in both lower lobes. There is a new small pleural effusion on each side. There is atelectatic change in both lung bases, increased from recent prior study. There is patchy ground-glass opacity noted in the periphery of the lingula, primarily in the inferior segment, a change from recent study. Upper Abdomen: In the visualized upper abdomen, there is widespread liver metastatic disease. Gallbladder is absent. Visualized upper abdominal structures appear unremarkable elsewhere. Musculoskeletal: There is widespread sclerotic metastatic disease throughout multiple sites in the thoracic and lower cervical spine. There is sclerotic metastatic disease in the superior right scapula as well as in several ribs bilaterally. Review of the MIP images confirms the above findings. IMPRESSION: No demonstrable pulmonary embolus. Areas of ground-glass opacity in both upper lobes and in the lingular regions, likely pneumonia. There is also persistent consolidation in the lateral segment right middle lobe, consistent with pneumonia. There are new small pleural effusions bilaterally with patchy atelectasis in lung bases. It should be  noted that at least some of the areas of ground-glass opacity in the upper lung regions potentially could represent a degree of lymphangitic spread of tumor. In this regard, a follow-up study in 4-6 weeks may be reasonable to further assess these changes in the lungs. Extensive bony metastatic disease as well as extensive lower metastatic disease noted. Electronically Signed   By: Lowella Grip III M.D.   On: 05/17/2016 15:29    Assessment: 50 y.o.  BRCA negative [place] woman originally from [place]  (1) status post left breast upper inner quadrant biopsy 05/17/2007 for a clinical mT2 N2, stage IIIA invasive ductal breast cancer, strongly estrogen receptor positive, weakly progesterone receptor positive, HER-2 negative, with an MIB-1 of 52%  (2) neoadjuvant chemotherapy consisted of dose dense doxorubicin and cyclophosphamide 4 followed by weekly paclitaxel 12, started 06/10/2007, completed 09/28/2007  (3) status post left modified radical mastectomy 10/27/2007 at [facility] [phone]) removed that invasive ductal carcinoma, grade 2, pT2, pN1a (downstaged to IIB), estrogen receptor strongly positive, progesterone receptor weakly positive, HER-2 not amplified by immunohistochemistry or FISH with a signals ratio of 0.94, number per cell 3.4  (4) status post adjuvant radiation to the left chest wall as well as the left supraclavicular and axillary nodal regions (50.4 gray +10 Gray boost) given between 12/15/2007 on 01/31/2008  (5) on adjuvant tamoxifen 02/20/2008 through March 2016  METASTATIC DISEASE: March 2016, presenting with bone pain (6) staging studies April 2016 showed diffuse bony metastatic disease, intrathoracic adenopathy, and small lung lesions.             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  (10) repeat  genetic testing 04/23/2016 through the [facility] Hereditary Cancer Panel offered by [facility] found no deleterious mutations in APC, ATM, BARD1, BMPR1A, BRCA1, BRCA2, BRIP1, CHD1, CDK4, CDKN2A, CHEK2, EPCAM (large rearrangement only),GREM1/SCG5, MLH1, MSH2, MSH6, MUTYH, NBN, PALB2, PMS2, POLD1, POLE, PTEN, RAD51C, RAD51D, SMAD4, STK11, and TP53  (11) gemcitabine started 04/24/2016, to be given days 1 and 8 of each 21 day cycle             (a) day 8 cycle 1 delayed because of cytopenias--dose reduced and neupogen added   Plan: Farran is currently day 4 dose 2 of single-agent gemcitabine. She was scheduled to receive neupogen today and tomorrow but at this point she refuses. She understands if her counts drop because of her recent chemo she may end up staying longer in the hospital. She understands also that her treatments so far have been delayed because of cytopenias and this may compromise effectiveness.  What she really wants to do tonight is "stop giving me fluids." I don't find an order for further IVF but just in case wrote for [facility]. We will readdress the neupogen issue in AM but at this point she is not neutropenic and is tolerating ABX well.  She is scheduled for labs, visit and treatment at the cancer center 05/29/2016.  Will follow with you.     [name], MD 05/18/2016  6:02 PM Medical Oncology and Hematology [facility] [address] [place], [place] [address] Tel. [phone]    Fax. [phone]

## 2016-05-19 ENCOUNTER — Ambulatory Visit: Payer: BLUE CROSS/BLUE SHIELD

## 2016-05-19 LAB — CBC WITH DIFFERENTIAL/PLATELET
BASOS PCT: 1 %
Basophils Absolute: 0 10*3/uL (ref 0.0–0.1)
EOS PCT: 2 %
Eosinophils Absolute: 0.1 10*3/uL (ref 0.0–0.7)
HCT: 24.7 % — ABNORMAL LOW (ref 36.0–46.0)
Hemoglobin: 8.2 g/dL — ABNORMAL LOW (ref 12.0–15.0)
Lymphocytes Relative: 16 %
Lymphs Abs: 0.4 10*3/uL — ABNORMAL LOW (ref 0.7–4.0)
MCH: 34.5 pg — AB (ref 26.0–34.0)
MCHC: 33.2 g/dL (ref 30.0–36.0)
MCV: 103.8 fL — AB (ref 78.0–100.0)
MONO ABS: 0.2 10*3/uL (ref 0.1–1.0)
Monocytes Relative: 7 %
NEUTROS PCT: 74 %
Neutro Abs: 1.9 10*3/uL (ref 1.7–7.7)
PLATELETS: 131 10*3/uL — AB (ref 150–400)
RBC: 2.38 MIL/uL — ABNORMAL LOW (ref 3.87–5.11)
RDW: 14.5 % (ref 11.5–15.5)
WBC: 2.6 10*3/uL — ABNORMAL LOW (ref 4.0–10.5)

## 2016-05-19 LAB — COMPREHENSIVE METABOLIC PANEL
ALK PHOS: 114 U/L (ref 38–126)
ALT: 98 U/L — AB (ref 14–54)
AST: 45 U/L — ABNORMAL HIGH (ref 15–41)
Albumin: 2.2 g/dL — ABNORMAL LOW (ref 3.5–5.0)
Anion gap: 6 (ref 5–15)
BILIRUBIN TOTAL: 0.9 mg/dL (ref 0.3–1.2)
BUN: 6 mg/dL (ref 6–20)
CALCIUM: 7.8 mg/dL — AB (ref 8.9–10.3)
CHLORIDE: 107 mmol/L (ref 101–111)
CO2: 25 mmol/L (ref 22–32)
CREATININE: 0.58 mg/dL (ref 0.44–1.00)
Glucose, Bld: 109 mg/dL — ABNORMAL HIGH (ref 65–99)
Potassium: 3 mmol/L — ABNORMAL LOW (ref 3.5–5.1)
Sodium: 138 mmol/L (ref 135–145)
TOTAL PROTEIN: 5.3 g/dL — AB (ref 6.5–8.1)

## 2016-05-19 MED ORDER — POTASSIUM CHLORIDE 20 MEQ/15ML (10%) PO SOLN
30.0000 meq | Freq: Two times a day (BID) | ORAL | Status: AC
  Administered 2016-05-19 (×2): 30 meq via ORAL
  Filled 2016-05-19 (×2): qty 30

## 2016-05-19 MED ORDER — TBO-FILGRASTIM 480 MCG/0.8ML ~~LOC~~ SOSY
480.0000 ug | PREFILLED_SYRINGE | Freq: Once | SUBCUTANEOUS | Status: DC
  Filled 2016-05-19: qty 0.8

## 2016-05-19 NOTE — Progress Notes (Signed)
PROGRESS NOTE  Jill Johnston  [id number] DOB: Feb 26, 1966 DOA: 05/17/2016 PCP: Audree Camel, MD  Outpatient Specialists: Oncology, Dr. Jana Hakim  Brief Narrative: Jill Johnston is a 50 y.o. 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 Though she denied symptoms including chest pain, dyspnea, cough, abd pain, N/V/D, dysuria, and wounds, she had an oral temperature of 100.3F at home. She last received chemotherapy 11/17. On arrival, the patient appeared chronically ill but in no distress, tachycardic 117-140, saturating 90% on room air. Hgb 9.8, WBC 4 (90% PMNs). LFTs were abnormal with TBili 1.5, AST/ALT 105/167, albumin 2.7, otherwise BMP unremarkable. Lactate was 1.39. UA with many bacteria, but 0-5 WBCs. CXR showed bibasilar atelectasis. D-dimer was elevated at 2.46, so CTA chest was obtained showing bilateral upper lobe GGOs, and RML consolidation without PE.  IV vancomycin and cefepime were started, blood cultures were obtained and she was admitted for treatment of HCAP.   Assessment & Plan: Principal Problem:   HCAP (healthcare-associated pneumonia) Active Problems:   Gastroesophageal reflux disease   Drug-induced leukopenia (HCC)   Carcinoma of left breast metastatic to multiple sites [facility])   Essential hypertension   Chemotherapy-induced thrombocytopenia  HCAP in the setting of metastatic breast cancer undergoing chemotherapy: RML and lingular infiltrates noted in addition to bilateral upper lobe GGOs on CTA chest. HIV neg. Urine strep Ag neg.  - Admitted for close monitoring given immunocompromised state.  - Continue Vancomycin and cefepime (11/19 >> ) hopeful to transition to po abx in next 24-48 hrs. - Blood cultures (11/19) NGTD - Lactate 1.39 > 1.4  Widely metastatic left breast cancer: - Will continue OxyContin and oxycodone due to bony metastases and for pain control - Dr.  Jana Hakim recommendations appreciated  Gastroesophageal reflux disease - Continue PPI  Essential hypertension - Continue lisinopril  Chemotherapy-induced pancytopenia: WBC and hgb trending downward with fluid resuscitation.  - Received G-CSF on 11/17 ([facility] was 1400), Onc note recommended neopogen which was declined by the patient but she will now take this, so formulary granix has been ordered. - Monitor counts closely - SCDs for DVT Ppx  Sinus tachycardia: Not acute, seen in office visit notes. PE ruled out.  - Continue to monitor.   Hypokalemia: K 3.4 11/20 > repleted and worsened, likely due to very poor po.  - Replete and recheck in AM.   DVT prophylaxis: SCDs due to thrombocytopenia Code Status: Full Family Communication: Husband at bedside Disposition Plan: Inpatient treatment with IV antibiotics.   Consultants:   None  Procedures:   None  Antimicrobials:  Vancomycin (11/19 >> )  Cefepime (11/19 >> )   Subjective: Breathing is "fine" and she wants to go home. Ambulating. Poor appetite. No N/V. No chest pain. No cough.   Objective: Vitals:   05/18/16 1345 05/18/16 2241 05/19/16 0649 05/19/16 1337  BP: 96/67 (!) 129/96 (!) 129/92 (!) 139/96  Pulse: (!) 110 (!) 122 100 (!) 115  Resp: 18 18 16 18   Temp: 100.1 F (37.8 C) 98.2 F (36.8 C) 97.7 F (36.5 C) 97.9 F (36.6 C)  TempSrc: Axillary Axillary Oral Axillary  SpO2: 96% 96% 97% 97%  Weight:      Height:        Intake/Output Summary (Last 24 hours) at 05/19/16 1425 Last data filed at 05/19/16 1300  Gross per 24 hour  Intake  830 ml  Output                0 ml  Net              830 ml   Filed Weights   05/17/16 0808 05/17/16 1235  Weight: 70.8 kg (156 lb) 70.8 kg (156 lb)    Examination: General exam: Chronically ill-appearing 50 y.o. female in no distress Respiratory system: Mildly labored breathing room air. Clear to auscultation bilaterally.  Cardiovascular system: Regular  rate and rhythm. No murmur, rub, or gallop. No JVD, and no pedal edema. Gastrointestinal system: Abdomen soft, non-tender, non-distended, with normoactive bowel sounds. No organomegaly or masses felt. Central nervous system: Alert and oriented. No focal neurological deficits. Extremities: Warm, no deformities Skin: No rashes, lesions no ulcers.  Psychiatry: Judgement and insight appear normal. Mood & affect appropriate.   Data Reviewed: I have personally reviewed following labs and imaging studies  CBC:  Recent Labs Lab 05/15/16 0933 05/17/16 0852 05/17/16 1300 05/18/16 0510 05/19/16 0440  WBC 2.4* 4.0 3.0* 2.8* 2.6*  NEUTROABS 1.4* 3.6  --   --  1.9  HGB 10.9* 9.8* 9.0* 8.2* 8.2*  HCT 34.3* 30.1* 27.4* 24.8* 24.7*  MCV 104.9* 105.2* 103.0* 104.2* 103.8*  PLT 107* 130* 121* 122* [id number]*   Basic Metabolic Panel:  Recent Labs Lab 05/15/16 0933 05/17/16 0852 05/17/16 1300 05/18/16 0510 05/19/16 0440  NA 138 136  --  136 138  K 3.9 3.5  --  3.4* 3.0*  CL  --  105  --  106 107  CO2 25 26  --  24 25  GLUCOSE 138 154*  --  136* 109*  BUN 11.2 11  --  9 6  CREATININE 0.7 0.65 0.74 0.62 0.58  CALCIUM 9.0 8.1*  --  7.5* 7.8*   GFR: Estimated Creatinine Clearance: 83 mL/min (by C-G formula based on SCr of 0.58 mg/dL). Liver Function Tests:  Recent Labs Lab 05/15/16 0933 05/17/16 0852 05/19/16 0440  AST 89* 105* 45*  ALT 213* 167* 98*  ALKPHOS 196* 125 114  BILITOT 0.65 1.5* 0.9  PROT 5.9* 5.4* 5.3*  ALBUMIN 2.6* 2.7* 2.2*   No results for input(s): LIPASE, AMYLASE in the last 168 hours. No results for input(s): AMMONIA in the last 168 hours. Coagulation Profile: No results for input(s): INR, PROTIME in the last 168 hours. Cardiac Enzymes: No results for input(s): CKTOTAL, CKMB, CKMBINDEX, TROPONINI in the last 168 hours. BNP (last 3 results) No results for input(s): PROBNP in the last 8760 hours. HbA1C: No results for input(s): HGBA1C in the last 72  hours. CBG: No results for input(s): GLUCAP in the last 168 hours. Lipid Profile: No results for input(s): CHOL, HDL, LDLCALC, TRIG, CHOLHDL, LDLDIRECT in the last 72 hours. Thyroid Function Tests: No results for input(s): TSH, T4TOTAL, FREET4, T3FREE, THYROIDAB in the last 72 hours. Anemia Panel: No results for input(s): VITAMINB12, FOLATE, FERRITIN, TIBC, IRON, RETICCTPCT in the last 72 hours. Urine analysis:    Component Value Date/Time   COLORURINE ORANGE (A) 05/17/2016 0852   APPEARANCEUR CLOUDY (A) 05/17/2016 0852   LABSPEC 1.027 05/17/2016 0852   PHURINE 6.0 05/17/2016 0852   GLUCOSEU NEGATIVE 05/17/2016 0852   HGBUR NEGATIVE 05/17/2016 0852   BILIRUBINUR SMALL (A) 05/17/2016 0852   KETONESUR NEGATIVE 05/17/2016 0852   PROTEINUR 30 (A) 05/17/2016 0852   NITRITE NEGATIVE 05/17/2016 0852   LEUKOCYTESUR TRACE (A) 05/17/2016 0852   Sepsis Labs: @LABRCNTIP (procalcitonin:4,lacticidven:4)  ) Recent  Results (from the past 240 hour(s))  TECHNOLOGIST REVIEW     Status: None   Collection Time: 05/15/16  9:33 AM  Result Value Ref Range Status   Technologist Review Metas and Myelocytes present. 2% nrbc.  Final  Culture, blood (Routine X 2)     Status: None (Preliminary result)   Collection Time: 05/17/16  8:50 AM  Result Value Ref Range Status   Specimen Description BLOOD PORTA CATH  Final   Special Requests BOTTLES DRAWN AEROBIC AND ANAEROBIC 10CC  Final   Culture   Final    NO GROWTH 2 DAYS Performed at [facility]    Report Status PENDING  Incomplete  Urine culture     Status: Abnormal   Collection Time: 05/17/16  8:52 AM  Result Value Ref Range Status   Specimen Description URINE, RANDOM  Final   Special Requests NONE  Final   Culture MULTIPLE SPECIES PRESENT, SUGGEST RECOLLECTION (A)  Final   Report Status 05/18/2016 FINAL  Final  Culture, blood (routine x 2) Call MD if unable to obtain prior to antibiotics being given     Status: None (Preliminary result)    Collection Time: 05/17/16  1:07 PM  Result Value Ref Range Status   Specimen Description BLOOD RIGHT ANTECUBITAL  Final   Special Requests IN PEDIATRIC BOTTLE [place]  Final   Culture   Final    NO GROWTH 2 DAYS Performed at [facility]    Report Status PENDING  Incomplete     Radiology Studies: Ct Angio Chest Pe W Or Wo Contrast  Result Date: 05/17/2016 CLINICAL DATA:  History of left-sided breast carcinoma. Presenting with fever and shortness of breath. Tachycardia. EXAM: CT ANGIOGRAPHY CHEST WITH CONTRAST TECHNIQUE: Multidetector CT imaging of the chest was performed using the standard protocol during bolus administration of intravenous contrast. Multiplanar CT image reconstructions and MIPs were obtained to evaluate the vascular anatomy. CONTRAST:  50 mL Isovue 370 nonionic COMPARISON:  Chest CT angiogram May 04, 2016; chest radiograph May 17, 2016 FINDINGS: Cardiovascular: There is no demonstrable pulmonary embolus. There is no thoracic aortic aneurysm or dissection. The visualized great vessels appear normal. There is slight calcification in the aorta. Pericardium is not appreciably thickened. Port-A-Cath tip is in the superior vena cava. Mediastinum/Nodes: Visualized thyroid appears unremarkable. There is no appreciable adenopathy in the thoracic region. Lungs/Pleura: Multiple foci of ground-glass type opacity are noted throughout both upper lobes, a change from recent prior study. There is a stable 6 mm nodular opacity in the posterior right apex, seen currently on axial slice 14 series 10. There is focal airspace consolidation in the lateral segment of the right middle lobe. There is atelectatic change in both lower lobes. There is a new small pleural effusion on each side. There is atelectatic change in both lung bases, increased from recent prior study. There is patchy ground-glass opacity noted in the periphery of the lingula, primarily in the inferior segment, a change from  recent study. Upper Abdomen: In the visualized upper abdomen, there is widespread liver metastatic disease. Gallbladder is absent. Visualized upper abdominal structures appear unremarkable elsewhere. Musculoskeletal: There is widespread sclerotic metastatic disease throughout multiple sites in the thoracic and lower cervical spine. There is sclerotic metastatic disease in the superior right scapula as well as in several ribs bilaterally. Review of the MIP images confirms the above findings. IMPRESSION: No demonstrable pulmonary embolus. Areas of ground-glass opacity in both upper lobes and in the lingular regions, likely pneumonia.  There is also persistent consolidation in the lateral segment right middle lobe, consistent with pneumonia. There are new small pleural effusions bilaterally with patchy atelectasis in lung bases. It should be noted that at least some of the areas of ground-glass opacity in the upper lung regions potentially could represent a degree of lymphangitic spread of tumor. In this regard, a follow-up study in 4-6 weeks may be reasonable to further assess these changes in the lungs. Extensive bony metastatic disease as well as extensive lower metastatic disease noted. Electronically Signed   By: Lowella Grip III M.D.   On: 05/17/2016 15:29    Scheduled Meds: . ceFEPime (MAXIPIME) IV  2 g Intravenous Q8H  . docusate sodium  100 mg Oral Daily  . lactose free nutrition  237 mL Oral BID BM  . lisinopril  10 mg Oral Daily  . oxyCODONE  30 mg Oral 2 times per day  . pantoprazole  40 mg Oral BID AC  . polyethylene glycol  17 g Oral Daily  . potassium chloride  30 mEq Oral BID  . vancomycin  750 mg Intravenous Q8H   Continuous Infusions: . sodium chloride 10 mL/hr at 05/19/16 0652     LOS: 2 days   Time spent: 25 minutes.  Vance Gather, MD [facility] Pager [phone]  If 7PM-7AM, please contact night-coverage [email or web address] Password [email or web address] 05/19/2016, 2:25 PM

## 2016-05-20 ENCOUNTER — Telehealth: Payer: Self-pay | Admitting: Oncology

## 2016-05-20 ENCOUNTER — Other Ambulatory Visit: Payer: Self-pay | Admitting: Oncology

## 2016-05-20 DIAGNOSIS — C7931 Secondary malignant neoplasm of brain: Secondary | ICD-10-CM | POA: Diagnosis present

## 2016-05-20 DIAGNOSIS — C787 Secondary malignant neoplasm of liver and intrahepatic bile duct: Secondary | ICD-10-CM | POA: Diagnosis present

## 2016-05-20 DIAGNOSIS — K219 Gastro-esophageal reflux disease without esophagitis: Secondary | ICD-10-CM | POA: Diagnosis present

## 2016-05-20 DIAGNOSIS — I493 Ventricular premature depolarization: Secondary | ICD-10-CM | POA: Diagnosis present

## 2016-05-20 DIAGNOSIS — R000 Tachycardia, unspecified: Secondary | ICD-10-CM | POA: Diagnosis present

## 2016-05-20 DIAGNOSIS — J189 Pneumonia, unspecified organism: Secondary | ICD-10-CM | POA: Diagnosis not present

## 2016-05-20 DIAGNOSIS — Y95 Nosocomial condition: Secondary | ICD-10-CM | POA: Diagnosis present

## 2016-05-20 DIAGNOSIS — R509 Fever, unspecified: Secondary | ICD-10-CM | POA: Diagnosis present

## 2016-05-20 DIAGNOSIS — Z170 Estrogen receptor positive status [ER+]: Secondary | ICD-10-CM | POA: Diagnosis not present

## 2016-05-20 DIAGNOSIS — C7802 Secondary malignant neoplasm of left lung: Secondary | ICD-10-CM | POA: Diagnosis present

## 2016-05-20 DIAGNOSIS — T451X5A Adverse effect of antineoplastic and immunosuppressive drugs, initial encounter: Secondary | ICD-10-CM | POA: Diagnosis present

## 2016-05-20 DIAGNOSIS — C7801 Secondary malignant neoplasm of right lung: Secondary | ICD-10-CM | POA: Diagnosis present

## 2016-05-20 DIAGNOSIS — C50912 Malignant neoplasm of unspecified site of left female breast: Secondary | ICD-10-CM | POA: Diagnosis present

## 2016-05-20 DIAGNOSIS — D61810 Antineoplastic chemotherapy induced pancytopenia: Secondary | ICD-10-CM | POA: Diagnosis present

## 2016-05-20 DIAGNOSIS — C7951 Secondary malignant neoplasm of bone: Secondary | ICD-10-CM | POA: Diagnosis present

## 2016-05-20 DIAGNOSIS — E876 Hypokalemia: Secondary | ICD-10-CM | POA: Diagnosis present

## 2016-05-20 DIAGNOSIS — D6959 Other secondary thrombocytopenia: Secondary | ICD-10-CM | POA: Diagnosis present

## 2016-05-20 DIAGNOSIS — I10 Essential (primary) hypertension: Secondary | ICD-10-CM | POA: Diagnosis present

## 2016-05-20 DIAGNOSIS — J9811 Atelectasis: Secondary | ICD-10-CM | POA: Diagnosis present

## 2016-05-20 DIAGNOSIS — F419 Anxiety disorder, unspecified: Secondary | ICD-10-CM | POA: Diagnosis present

## 2016-05-20 LAB — BASIC METABOLIC PANEL WITH GFR
Anion gap: 5 (ref 5–15)
BUN: <5 mg/dL — ABNORMAL LOW (ref 6–20)
CO2: 26 mmol/L (ref 22–32)
Calcium: 8 mg/dL — ABNORMAL LOW (ref 8.9–10.3)
Chloride: 105 mmol/L (ref 101–111)
Creatinine, Ser: 0.55 mg/dL (ref 0.44–1.00)
GFR calc Af Amer: >60 mL/min (ref >60)
GFR calc non Af Amer: >60 mL/min (ref >60)
Glucose, Bld: 142 mg/dL — ABNORMAL HIGH (ref 65–99)
Potassium: 3.2 mmol/L — ABNORMAL LOW (ref 3.5–5.1)
Sodium: 136 mmol/L (ref 135–145)

## 2016-05-20 LAB — CBC WITH DIFFERENTIAL/PLATELET
Basophils Absolute: 0 K/uL (ref 0.0–0.1)
Basophils Relative: 1 %
Eosinophils Absolute: 0.1 K/uL (ref 0.0–0.7)
Eosinophils Relative: 2 %
HCT: 26.3 % — ABNORMAL LOW (ref 36.0–46.0)
Hemoglobin: 8.9 g/dL — ABNORMAL LOW (ref 12.0–15.0)
Lymphocytes Relative: 16 %
Lymphs Abs: 0.4 K/uL — ABNORMAL LOW (ref 0.7–4.0)
MCH: 34.8 pg — ABNORMAL HIGH (ref 26.0–34.0)
MCHC: 33.8 g/dL (ref 30.0–36.0)
MCV: 102.7 fL — ABNORMAL HIGH (ref 78.0–100.0)
Monocytes Absolute: 0.2 K/uL (ref 0.1–1.0)
Monocytes Relative: 6 %
Neutro Abs: 1.9 K/uL (ref 1.7–7.7)
Neutrophils Relative %: 75 %
Platelets: 192 K/uL (ref 150–400)
RBC: 2.56 MIL/uL — ABNORMAL LOW (ref 3.87–5.11)
RDW: 14.3 % (ref 11.5–15.5)
WBC: 2.6 K/uL — ABNORMAL LOW (ref 4.0–10.5)

## 2016-05-20 MED ORDER — POTASSIUM CHLORIDE 20 MEQ/15ML (10%) PO SOLN
20.0000 meq | Freq: Once | ORAL | Status: AC
  Administered 2016-05-20: 20 meq via ORAL
  Filled 2016-05-20: qty 15

## 2016-05-20 MED ORDER — LORAZEPAM 1 MG PO TABS
1.0000 mg | ORAL_TABLET | Freq: Once | ORAL | Status: AC
  Administered 2016-05-20: 1 mg via ORAL
  Filled 2016-05-20 (×2): qty 1

## 2016-05-20 MED ORDER — LEVALBUTEROL HCL 0.63 MG/3ML IN NEBU: 0.6300 mg | INHALATION_SOLUTION | RESPIRATORY_TRACT | Status: DC | PRN

## 2016-05-20 MED ORDER — LEVOFLOXACIN 750 MG PO TABS: 750.0000 mg | ORAL_TABLET | Freq: Every day | ORAL | 0 refills | Status: DC

## 2016-05-20 MED ORDER — LEVALBUTEROL HCL 1.25 MG/0.5ML IN NEBU
1.2500 mg | INHALATION_SOLUTION | Freq: Once | RESPIRATORY_TRACT | Status: DC
  Filled 2016-05-20: qty 0.5

## 2016-05-20 MED ORDER — ALBUTEROL SULFATE (2.5 MG/3ML) 0.083% IN NEBU: 5.0000 mg | INHALATION_SOLUTION | Freq: Once | RESPIRATORY_TRACT | Status: DC

## 2016-05-20 MED ORDER — BOOST PLUS PO LIQD: 237.0000 mL | Freq: Two times a day (BID) | ORAL | 0 refills | Status: DC

## 2016-05-20 MED ORDER — HEPARIN SOD (PORK) LOCK FLUSH 100 UNIT/ML IV SOLN
500.0000 [IU] | Freq: Once | INTRAVENOUS | Status: AC
  Administered 2016-05-20: 500 [IU] via INTRAVENOUS
  Filled 2016-05-20: qty 5

## 2016-05-20 NOTE — Telephone Encounter (Signed)
sw pt to confirm 11/27 and 11/28 appt date/time per LOS

## 2016-05-20 NOTE — Progress Notes (Signed)
Patient verbalized understanding of discharge instructions. Patient is stable at discharge. Patient's husband is at bedside and mode of transportation.

## 2016-05-20 NOTE — Discharge Instructions (Signed)
Community-Acquired Pneumonia, Adult °Introduction °Pneumonia is an infection of the lungs. One type of pneumonia can happen while a person is in a hospital. A different type can happen when a person is not in a hospital (community-acquired pneumonia). It is easy for this kind to spread from person to person. It can spread to you if you breathe near an infected person who coughs or sneezes. Some symptoms include: °· A dry cough. °· A wet (productive) cough. °· Fever. °· Sweating. °· Chest pain. °Follow these instructions at home: °· Take over-the-counter and prescription medicines only as told by your doctor. °¨ Only take cough medicine if you are losing sleep. °¨ If you were prescribed an antibiotic medicine, take it as told by your doctor. Do not stop taking the antibiotic even if you start to feel better. °· Sleep with your head and neck raised (elevated). You can do this by putting a few pillows under your head, or you can sleep in a recliner. °· Do not use tobacco products. These include cigarettes, chewing tobacco, and e-cigarettes. If you need help quitting, ask your doctor. °· Drink enough water to keep your pee (urine) clear or pale yellow. °A shot (vaccine) can help prevent pneumonia. Shots are often suggested for: °· People older than 50 years of age. °· People older than 50 years of age: °¨ Who are having cancer treatment. °¨ Who have long-term (chronic) lung disease. °¨ Who have problems with their body's defense system (immune system). °You may also prevent pneumonia if you take these actions: °· Get the flu (influenza) shot every year. °· Go to the dentist as often as told. °· Wash your hands often. If soap and water are not available, use hand sanitizer. °Contact a doctor if: °· You have a fever. °· You lose sleep because your cough medicine does not help. °Get help right away if: °· You are short of breath and it gets worse. °· You have more chest pain. °· Your sickness gets worse. This is very  serious if: °¨ You are an older adult. °¨ Your body's defense system is weak. °· You cough up blood. °This information is not intended to replace advice given to you by your health care provider. Make sure you discuss any questions you have with your health care provider. °Document Released: 12/02/2007 Document Revised: 11/21/2015 Document Reviewed: 10/10/2014 °© 2017 [employer] ° °

## 2016-05-20 NOTE — Progress Notes (Signed)
Mrs. 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   Headache-frequency,location: Yes Fatigue: Having fatigue Hair loss:Yes, little hair growth Erythema/Hyperpigmentation of forehead,scalp and ears: Normal scalp Auditory changes(manifest post radiation): None Nausea/vomiting Ataxia(unsteady gait): No Vision (Blurred/double vision,blind spots, and peripheral vsion changes):  Fine motor movement-picking up objects with fingers,holding objects, writing, weakness of lower extremities: None Cognitive changes: No  Aphasia/Slurred speech: No Next chemotherapy cycle is Friday, May 29, 2016 gemcitabine Off steroids for two weeks BP 111/80   Pulse (!) 125   Temp 97.8 F (36.6 C) (Oral)   Resp 18   Ht 5\' 5"  (1.651 m)   Wt 152 lb 6.4 oz (69.1 kg)   SpO2 97%   BMI 25.36 kg/m

## 2016-05-20 NOTE — Discharge Summary (Addendum)
Physician Discharge Summary  Jill Johnston [id number] DOB: 01/19/1966 DOA: 05/17/2016  PCP: Audree Camel, MD  Admit date: 05/17/2016 Discharge date: 05/20/2016  Recommendations for Outpatient Follow-up:  Please follow up with oncology per scheduled appt. Take Levaquin 750 mg daily for 7 days on discharge for pneumonia. 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 Though she denied symptoms including chest pain, dyspnea, cough, abd pain, N/V/D, dysuria, and wounds, she had an oral temperature of 100.5Fat home. She last received chemotherapy 11/17. On arrival, the patient appeared chronically ill but in no distress, tachycardic 117-140, saturating 90% on room air. Hgb 9.8, WBC 4 (90% PMNs). LFTs were abnormal with TBili 1.5, AST/ALT 105/167, albumin 2.7, otherwise BMP unremarkable. Lactate was 1.39. UA with many bacteria, but 0-5 WBCs. CXR showed bibasilar atelectasis. D-dimer was elevated at 2.46, so CTA chest was obtained showing bilateral upper lobe GGOs, and RML consolidation without PE.  IV vancomycin and cefepime were started, blood cultures were obtained and she was admitted for treatment of HCAP. "  Hospital Course:    Assessment & Plan:  HCAP in the setting of metastatic breast cancer undergoing chemotherapy - RML and lingular infiltrates noted in  addition to bilateral upper lobe GGOs on CTA chest. HIV neg. Urine strep Ag neg.  - Continue Vancomycin and cefepime (11/19 >> )  - Transition to Levaquin for 7 days on discharge  - Blood cultures (11/19) NGTD  Widely metastatic left breast cancer: - Follows with Dr. Jana Hakim on outpt   Gastroesophageal reflux disease - Continue PPI  Essential hypertension - Continue  home med  Chemotherapy-induced pancytopenia - Improved  - Received G-CSF on 11/17 ([place] was 1400) - SCDs for DVT prophylaxis  - Platelets WNL this am  Sinus tachycardia - Stable, due to acute infectious etiology   Hypokalemia - Supplemented prior to discharge    DVT prophylaxis: SCDs due to thrombocytopenia Code Status: Full Family Communication: No family at the bedside    Consultants:   Oncology   Procedures:   None  Antimicrobials:  Vancomycin (11/19 >> 11/22)  Cefepime (11/19 >> 11/22)   Signed:  Leisa Lenz, MD  [facility] 05/20/2016, 10:58 AM  Pager #: [phone]  Time spent in minutes: less than 30 minutes    Discharge Exam: Vitals:   05/20/16 0231 05/20/16 0426  BP: 106/77 125/78  Pulse: (!) 101 (!) 113  Resp: 18 18  Temp: 98.1 F (36.7 C) 98.2 F (36.8 C)   Vitals:   05/19/16 1337 05/19/16 2003 05/20/16 0231 05/20/16 0426  BP: (!) 139/96 114/79 106/77 125/78  Pulse: (!) 115 (!) 109 (!) 101 (!) 113  Resp: 18 17 18 18   Temp: 97.9 F (36.6 C) 97.3 F (36.3 C) 98.1 F (36.7 C) 98.2 F (36.8 C)  TempSrc: Axillary Oral Oral Oral  SpO2: 97% 98% 97% 97%  Weight:      Height:  General: Pt is alert, follows commands appropriately, not in acute distress Cardiovascular: Regular rate and rhythm, S1/S2 + Respiratory: Clear to auscultation bilaterally, no wheezing, no crackles, no rhonchi Abdominal: Soft, non tender, non distended, bowel sounds +, no guarding Extremities: no edema, no cyanosis, pulses palpable bilaterally DP and PT Neuro:  Grossly nonfocal  Discharge Instructions  Discharge Instructions    Call MD for:  persistant nausea and vomiting    Complete by:  As directed    Call MD for:  redness, tenderness, or signs of infection (pain, swelling, redness, odor or green/yellow discharge around incision site)    Complete by:  As directed    Call MD for:  severe uncontrolled pain    Complete by:  As directed    Diet - low sodium heart healthy    Complete by:  As directed    Discharge instructions    Complete by:  As directed    Please follow up with oncology per scheduled appt. Take Levaquin 750 mg daily for 7 days on discharge for pneumonia. If you have any other questions or concerns please do not hesitate to call me at [phone] Leisa Lenz, MD)   Increase activity slowly    Complete by:  As directed        Medication List    STOP taking these medications   chlorhexidine 0.12 % solution Commonly known as:  PERIDEX   nystatin 100000 UNIT/ML suspension Commonly known as:  MYCOSTATIN   ondansetron 8 MG tablet Commonly known as:  ZOFRAN     TAKE these medications   acetaminophen 500 MG tablet Commonly known as:  TYLENOL Take 1,000 mg by mouth every 6 (six) hours as needed.   diphenhydrAMINE 25 mg capsule Commonly known as:  BENADRYL Take 25 mg by mouth at bedtime as needed.   docusate sodium 100 MG capsule Commonly known as:  COLACE Take 100 mg by mouth daily.   fluconazole 100 MG tablet Commonly known as:  DIFLUCAN Take 1 tablet (100 mg total) by mouth daily as needed (for thrush).   ibuprofen 200 MG tablet Commonly known as:  ADVIL,MOTRIN Take 400 mg by mouth daily as needed.   lactose free nutrition Liqd Take 237 mLs by mouth 2 (two) times daily between meals.   levofloxacin 750 MG tablet Commonly known as:  LEVAQUIN Take 1 tablet (750 mg total) by mouth daily.   lidocaine-prilocaine cream Commonly known as:  EMLA Apply 1 application topically as needed. To port 1 hour before  going to be accessed with needle. Cover with plastic wrap.   lisinopril 10 MG tablet Commonly known as:  PRINIVIL,ZESTRIL Take 1 tablet (10 mg total) by mouth daily.   Oxycodone HCl 10 MG Tabs Take 1 tablet by mouth every 3 (three) hours as needed for pain.   oxyCODONE 30 MG 12 hr tablet Commonly known as:  OXYCONTIN Take 30 mg by mouth 2 (two) times daily.   pantoprazole 40 MG tablet Commonly known as:  PROTONIX TAKE 1 TABLET(40 MG) BY MOUTH TWICE DAILY BEFORE A MEAL What changed:  See the new instructions.   polyethylene glycol packet Commonly known as:  MIRALAX / GLYCOLAX Take 17 grams by mouth every day.      Follow-up Information    DAROVSKY,BORIS, MD. Schedule an appointment as soon as possible for a visit in 1 week(s).   Specialty:  Internal Medicine Contact information: [address] [place] [address] [phone]  The results of significant diagnostics from this hospitalization (including imaging, microbiology, ancillary and laboratory) are listed below for reference.    Significant Diagnostic Studies: Dg Chest 2 View  Result Date: 05/17/2016 CLINICAL DATA:  Fever.  Breast cancer, chemotherapy patient. EXAM: CHEST  2 VIEW COMPARISON:  05/04/2016 FINDINGS: Right Port-A-Cath is in place with the tip in the SVC. Increasing bibasilar airspace opacities since prior study which could represent atelectasis or infiltrate. Area most concerning for pneumonia is in the right lung base. No effusions. Heart is normal size. IMPRESSION: Increasing bibasilar atelectasis or infiltrates. Cannot exclude pneumonia, particularly in the right base. Electronically Signed   By: Rolm Baptise M.D.   On: 05/17/2016 08:47   Dg Chest 2 View  Result Date: 05/04/2016 CLINICAL DATA:  Several hours of shortness of breath. Difficulty swallowing. Stage IV breast malignancy metastatic to the bones currently on chemo radiation. History of previous left mastectomy. EXAM: CHEST  2  VIEW COMPARISON:  Chest CT scan dated March 16, 2016 and PA and lateral chest x-ray of August 08, 2014. FINDINGS: The lungs are mildly hypo inflated. Subtle increased density is noted at both lung bases. There is no pleural effusion or pneumothorax. The heart and pulmonary vascularity are normal. The mediastinum is normal in width. The power port catheter tip projects over the midportion of the SVC. The observed portions of the upper abdomen are normal. The left breast shadow is absent. The bony structures exhibit no acute abnormalities. IMPRESSION: Bibasilar atelectasis or infiltrate. The possibility of lymphatic spread of malignancy was raised on the previous CT scan. No CHF. No pulmonary parenchymal masses are observed. If the patient's symptoms might be related to pneumonia, follow-up PA and lateral chest X-ray would be recommended in 3-4 weeks following trial of antibiotic therapy to ensure resolution. Recommended in 3-4 weeks following trial of antibiotic therapy to ensure resolution. If lymphatic spread of malignancy is felt be most likely, chest CT scanning now would be recommended. Electronically Signed   By: David  Martinique M.D.   On: 05/04/2016 12:47   Ct Angio Chest Pe W Or Wo Contrast  Result Date: 05/17/2016 CLINICAL DATA:  History of left-sided breast carcinoma. Presenting with fever and shortness of breath. Tachycardia. EXAM: CT ANGIOGRAPHY CHEST WITH CONTRAST TECHNIQUE: Multidetector CT imaging of the chest was performed using the standard protocol during bolus administration of intravenous contrast. Multiplanar CT image reconstructions and MIPs were obtained to evaluate the vascular anatomy. CONTRAST:  50 mL Isovue 370 nonionic COMPARISON:  Chest CT angiogram May 04, 2016; chest radiograph May 17, 2016 FINDINGS: Cardiovascular: There is no demonstrable pulmonary embolus. There is no thoracic aortic aneurysm or dissection. The visualized great vessels appear normal. There is slight  calcification in the aorta. Pericardium is not appreciably thickened. Port-A-Cath tip is in the superior vena cava. Mediastinum/Nodes: Visualized thyroid appears unremarkable. There is no appreciable adenopathy in the thoracic region. Lungs/Pleura: Multiple foci of ground-glass type opacity are noted throughout both upper lobes, a change from recent prior study. There is a stable 6 mm nodular opacity in the posterior right apex, seen currently on axial slice 14 series 10. There is focal airspace consolidation in the lateral segment of the right middle lobe. There is atelectatic change in both lower lobes. There is a new small pleural effusion on each side. There is atelectatic change in both lung bases, increased from recent prior study. There is patchy ground-glass opacity noted in the periphery of the lingula, primarily in the inferior segment, a  change from recent study. Upper Abdomen: In the visualized upper abdomen, there is widespread liver metastatic disease. Gallbladder is absent. Visualized upper abdominal structures appear unremarkable elsewhere. Musculoskeletal: There is widespread sclerotic metastatic disease throughout multiple sites in the thoracic and lower cervical spine. There is sclerotic metastatic disease in the superior right scapula as well as in several ribs bilaterally. Review of the MIP images confirms the above findings. IMPRESSION: No demonstrable pulmonary embolus. Areas of ground-glass opacity in both upper lobes and in the lingular regions, likely pneumonia. There is also persistent consolidation in the lateral segment right middle lobe, consistent with pneumonia. There are new small pleural effusions bilaterally with patchy atelectasis in lung bases. It should be noted that at least some of the areas of ground-glass opacity in the upper lung regions potentially could represent a degree of lymphangitic spread of tumor. In this regard, a follow-up study in 4-6 weeks may be reasonable to  further assess these changes in the lungs. Extensive bony metastatic disease as well as extensive lower metastatic disease noted. Electronically Signed   By: Lowella Grip III M.D.   On: 05/17/2016 15:29   Ct Angio Chest Pe W And/or Wo Contrast  Result Date: 05/04/2016 CLINICAL DATA:  Cough with shortness of breath history of metastatic breast cancer to bone liver lung and brain EXAM: CT ANGIOGRAPHY CHEST WITH CONTRAST TECHNIQUE: Multidetector CT imaging of the chest was performed using the standard protocol during bolus administration of intravenous contrast. Multiplanar CT image reconstructions and MIPs were obtained to evaluate the vascular anatomy. CONTRAST:  66 mL Isovue 370 intravenous COMPARISON:  Chest x-ray 05/04/2016, CT 03/16/2016 FINDINGS: Cardiovascular: Satisfactory opacification of the pulmonary arteries to the segmental level. No evidence of pulmonary embolism. Normal heart size. No pericardial effusion. Vascular catheter tip at the proximal right atrium. Mediastinum/Nodes: No enlarged mediastinal, hilar, or axillary lymph nodes. Thyroid gland, trachea, and esophagus demonstrate no significant findings. Lungs/Pleura: Stable 5 mm mildly spiculated nodule in the right upper lobe, series 7, image number 21. Previously noted left perifissural nodule not as well appreciated on the current exam. Again visualized is diffuse septal thickening, right greater than left, findings have progressed bilaterally. Hazy attenuation is present within the upper lobes and bilateral lower lobes. There is partial atelectasis or consolidation in the right middle lobe and left lower lobe. Upper Abdomen: Diffuse decreased density of the liver, consistent with fatty changes. Poorly defined enhancing masses are present within the liver, suspected to be increased although evaluation limited due to arterial phase exam. Surgical clips in the gallbladder fossa. Musculoskeletal: Again visualized is extensive skeletal  metastatic disease of the spine and bilateral ribs, grossly similar compared to prior study. Review of the MIP images confirms the above findings. IMPRESSION: 1. No CT evidence for acute pulmonary embolus or aortic dissection. 2. Progression of diffuse bilateral septal thickening, right greater and than left. Concern previously raised for lymphangitic spread of tumor. Stable 5 mm spiculated nodule right upper lobe. Interim finding of partial atelectasis or consolidation in the right middle lobe and left lower lobe. 3. Multiple poorly visualized hepatic masses with fatty changes of the liver. Suspect that the masses have increased in size . 4. Extensive skeletal metastatic disease, grossly unchanged. Electronically Signed   By: Donavan Foil M.D.   On: 05/04/2016 14:39   Ir US Guide Vasc Access Right  Result Date: 04/22/2016 INDICATION: 50 year old with metastatic breast cancer. EXAM: FLUOROSCOPIC AND ULTRASOUND GUIDED PLACEMENT OF A SUBCUTANEOUS PORT COMPARISON:  None. MEDICATIONS: Ancef  2 g; The antibiotic was administered within an appropriate time interval prior to skin puncture. ANESTHESIA/SEDATION: Versed 5.0 mg IV; Fentanyl 100 mcg IV; Moderate Sedation Time:  37 minutes The patient was continuously monitored during the procedure by the interventional radiology nurse under my direct supervision. FLUOROSCOPY TIME:  36 seconds, 6 mGy COMPLICATIONS: None immediate. PROCEDURE: The procedure, risks, benefits, and alternatives were explained to the patient. Questions regarding the procedure were encouraged and answered. The patient understands and consents to the procedure. Patient was placed supine on the interventional table. Ultrasound confirmed a patent right internal jugular vein. The right chest and neck were cleaned with a skin antiseptic and a sterile drape was placed. Maximal barrier sterile technique was utilized including caps, mask, sterile gowns, sterile gloves, sterile drape, hand hygiene and skin  antiseptic. The right neck was anesthetized with 1% lidocaine. Small incision was made in the right neck with a blade. Micropuncture set was placed in the right internal jugular vein with ultrasound guidance. The micropuncture wire was used for measurement purposes. The right chest was anesthetized with 1% lidocaine with epinephrine. #15 blade was used to make an incision and a subcutaneous port pocket was formed. Regal was assembled. Subcutaneous tunnel was formed with a stiff tunneling device. The port catheter was brought through the subcutaneous tunnel. The port was placed in the subcutaneous pocket. The micropuncture set was exchanged for a peel-away sheath. The catheter was placed through the peel-away sheath and the tip was positioned at the cavoatrial junction. Catheter placement was confirmed with fluoroscopy. The port was accessed and flushed with heparinized saline. The port pocket was closed using two layers of absorbable sutures and Dermabond. The vein skin site was closed using a single layer of absorbable suture and Dermabond. Sterile dressings were applied. Patient tolerated the procedure well without an immediate complication. Ultrasound and fluoroscopic images were taken and saved for this procedure. IMPRESSION: Placement of a subcutaneous port device. Catheter is ready to be used. Electronically Signed   By: Markus Daft M.D.   On: 04/22/2016 17:50   Ir Fluoro Guide Port Insertion Right  Result Date: 04/22/2016 INDICATION: 50 year old with metastatic breast cancer. EXAM: FLUOROSCOPIC AND ULTRASOUND GUIDED PLACEMENT OF A SUBCUTANEOUS PORT COMPARISON:  None. MEDICATIONS: Ancef 2 g; The antibiotic was administered within an appropriate time interval prior to skin puncture. ANESTHESIA/SEDATION: Versed 5.0 mg IV; Fentanyl 100 mcg IV; Moderate Sedation Time:  37 minutes The patient was continuously monitored during the procedure by the interventional radiology nurse under my direct  supervision. FLUOROSCOPY TIME:  36 seconds, 6 mGy COMPLICATIONS: None immediate. PROCEDURE: The procedure, risks, benefits, and alternatives were explained to the patient. Questions regarding the procedure were encouraged and answered. The patient understands and consents to the procedure. Patient was placed supine on the interventional table. Ultrasound confirmed a patent right internal jugular vein. The right chest and neck were cleaned with a skin antiseptic and a sterile drape was placed. Maximal barrier sterile technique was utilized including caps, mask, sterile gowns, sterile gloves, sterile drape, hand hygiene and skin antiseptic. The right neck was anesthetized with 1% lidocaine. Small incision was made in the right neck with a blade. Micropuncture set was placed in the right internal jugular vein with ultrasound guidance. The micropuncture wire was used for measurement purposes. The right chest was anesthetized with 1% lidocaine with epinephrine. #15 blade was used to make an incision and a subcutaneous port pocket was formed. [place] was assembled. Subcutaneous  tunnel was formed with a stiff tunneling device. The port catheter was brought through the subcutaneous tunnel. The port was placed in the subcutaneous pocket. The micropuncture set was exchanged for a peel-away sheath. The catheter was placed through the peel-away sheath and the tip was positioned at the cavoatrial junction. Catheter placement was confirmed with fluoroscopy. The port was accessed and flushed with heparinized saline. The port pocket was closed using two layers of absorbable sutures and Dermabond. The vein skin site was closed using a single layer of absorbable suture and Dermabond. Sterile dressings were applied. Patient tolerated the procedure well without an immediate complication. Ultrasound and fluoroscopic images were taken and saved for this procedure. IMPRESSION: Placement of a subcutaneous port device. Catheter  is ready to be used. Electronically Signed   By: Markus Daft M.D.   On: 04/22/2016 17:50    Microbiology: Recent Results (from the past 240 hour(s))  TECHNOLOGIST REVIEW     Status: None   Collection Time: 05/15/16  9:33 AM  Result Value Ref Range Status   Technologist Review Metas and Myelocytes present. 2% nrbc.  Final  Culture, blood (Routine X 2)     Status: None (Preliminary result)   Collection Time: 05/17/16  8:50 AM  Result Value Ref Range Status   Specimen Description BLOOD PORTA CATH  Final   Special Requests BOTTLES DRAWN AEROBIC AND ANAEROBIC 10CC  Final   Culture   Final    NO GROWTH 2 DAYS Performed at [facility]    Report Status PENDING  Incomplete  Urine culture     Status: Abnormal   Collection Time: 05/17/16  8:52 AM  Result Value Ref Range Status   Specimen Description URINE, RANDOM  Final   Special Requests NONE  Final   Culture MULTIPLE SPECIES PRESENT, SUGGEST RECOLLECTION (A)  Final   Report Status 05/18/2016 FINAL  Final  Culture, blood (routine x 2) Call MD if unable to obtain prior to antibiotics being given     Status: None (Preliminary result)   Collection Time: 05/17/16  1:07 PM  Result Value Ref Range Status   Specimen Description BLOOD RIGHT ANTECUBITAL  Final   Special Requests IN PEDIATRIC BOTTLE [place]  Final   Culture   Final    NO GROWTH 2 DAYS Performed at [facility]    Report Status PENDING  Incomplete     Labs: Basic Metabolic Panel:  Recent Labs Lab 05/15/16 0933 05/17/16 0852 05/17/16 1300 05/18/16 0510 05/19/16 0440 05/20/16 0930  NA 138 136  --  136 138 136  K 3.9 3.5  --  3.4* 3.0* 3.2*  CL  --  105  --  106 107 105  CO2 25 26  --  24 25 26   GLUCOSE 138 154*  --  136* 109* 142*  BUN 11.2 11  --  9 6 <5*  CREATININE 0.7 0.65 0.74 0.62 0.58 0.55  CALCIUM 9.0 8.1*  --  7.5* 7.8* 8.0*   Liver Function Tests:  Recent Labs Lab 05/15/16 0933 05/17/16 0852 05/19/16 0440  AST 89* 105* 45*  ALT 213* 167*  98*  ALKPHOS 196* 125 114  BILITOT 0.65 1.5* 0.9  PROT 5.9* 5.4* 5.3*  ALBUMIN 2.6* 2.7* 2.2*   No results for input(s): LIPASE, AMYLASE in the last 168 hours. No results for input(s): AMMONIA in the last 168 hours. CBC:  Recent Labs Lab 05/15/16 0933 05/17/16 0852 05/17/16 1300 05/18/16 0510 05/19/16 0440 05/20/16 0500  WBC  2.4* 4.0 3.0* 2.8* 2.6* 2.6*  NEUTROABS 1.4* 3.6  --   --  1.9 1.9  HGB 10.9* 9.8* 9.0* 8.2* 8.2* 8.9*  HCT 34.3* 30.1* 27.4* 24.8* 24.7* 26.3*  MCV 104.9* 105.2* 103.0* 104.2* 103.8* 102.7*  PLT 107* 130* 121* 122* 131* 192   Cardiac Enzymes: No results for input(s): CKTOTAL, CKMB, CKMBINDEX, TROPONINI in the last 168 hours. BNP: BNP (last 3 results)  Recent Labs  05/04/16 1307  BNP 30.4    ProBNP (last 3 results) No results for input(s): PROBNP in the last 8760 hours.  CBG: No results for input(s): GLUCAP in the last 168 hours.

## 2016-05-20 NOTE — Progress Notes (Signed)
Pt had an episode of SOB and anxiety. Oxygen saturation was at 97%. Lung sounds clear and diminished. NP on call made aware. New orders for Xopenex and Ativan placed. However, pt refused both treatments. Will continue to monitor.

## 2016-05-22 LAB — CULTURE, BLOOD (ROUTINE X 2)
Culture: NO GROWTH
Culture: NO GROWTH

## 2016-05-25 ENCOUNTER — Encounter: Payer: Self-pay | Admitting: Pharmacist

## 2016-05-25 ENCOUNTER — Other Ambulatory Visit: Payer: Self-pay | Admitting: Oncology

## 2016-05-25 ENCOUNTER — Ambulatory Visit (HOSPITAL_BASED_OUTPATIENT_CLINIC_OR_DEPARTMENT_OTHER): Payer: BLUE CROSS/BLUE SHIELD

## 2016-05-25 VITALS — BP 119/82 | HR 110 | Temp 98.1°F | Resp 20

## 2016-05-25 DIAGNOSIS — Z5189 Encounter for other specified aftercare: Secondary | ICD-10-CM

## 2016-05-25 DIAGNOSIS — C50212 Malignant neoplasm of upper-inner quadrant of left female breast: Secondary | ICD-10-CM | POA: Diagnosis not present

## 2016-05-25 DIAGNOSIS — C787 Secondary malignant neoplasm of liver and intrahepatic bile duct: Secondary | ICD-10-CM

## 2016-05-25 DIAGNOSIS — C7931 Secondary malignant neoplasm of brain: Secondary | ICD-10-CM | POA: Diagnosis not present

## 2016-05-25 DIAGNOSIS — C7800 Secondary malignant neoplasm of unspecified lung: Secondary | ICD-10-CM

## 2016-05-25 DIAGNOSIS — C7951 Secondary malignant neoplasm of bone: Secondary | ICD-10-CM | POA: Diagnosis not present

## 2016-05-25 DIAGNOSIS — C50912 Malignant neoplasm of unspecified site of left female breast: Secondary | ICD-10-CM

## 2016-05-25 MED ORDER — TBO-FILGRASTIM 300 MCG/0.5ML ~~LOC~~ SOSY
300.0000 ug | PREFILLED_SYRINGE | Freq: Once | SUBCUTANEOUS | Status: AC
  Administered 2016-05-25: 300 ug via SUBCUTANEOUS
  Filled 2016-05-25: qty 0.5

## 2016-05-25 MED ORDER — FILGRASTIM 300 MCG/0.5ML IJ SOSY
300.0000 ug | PREFILLED_SYRINGE | Freq: Once | INTRAMUSCULAR | Status: DC
  Filled 2016-05-25: qty 0.5

## 2016-05-25 NOTE — Patient Instructions (Signed)
Tbo-Filgrastim injection What is this medicine? TBO-FILGRASTIM (T B O fil GRA stim) is a granulocyte colony-stimulating factor that stimulates the growth of neutrophils, a type of white blood cell important in the body's fight against infection. It is used to reduce the incidence of fever and infection in patients with certain types of cancer who are receiving chemotherapy that affects the bone marrow. COMMON BRAND NAME(S): Granix What should I tell my health care provider before I take this medicine? They need to know if you have any of these conditions: -ongoing radiation therapy -sickle cell anemia -an unusual or allergic reaction to tbo-filgrastim, filgrastim, pegfilgrastim, other medicines, foods, dyes, or preservatives -pregnant or trying to get pregnant -breast-feeding How should I use this medicine? This medicine is for injection under the skin. If you get this medicine at home, you will be taught how to prepare and give this medicine. Refer to the Instructions for Use that come with your medication packaging. Use exactly as directed. Take your medicine at regular intervals. Do not take your medicine more often than directed. It is important that you put your used needles and syringes in a special sharps container. Do not put them in a trash can. If you do not have a sharps container, call your pharmacist or healthcare provider to get one. Talk to your pediatrician regarding the use of this medicine in children. Special care may be needed. What if I miss a dose? It is important not to miss your dose. Call your doctor or health care professional if you miss a dose. What may interact with this medicine? This medicine may interact with the following medications: -medicines that may cause a release of neutrophils, such as lithium What should I watch for while using this medicine? You may need blood work done while you are taking this medicine. What side effects may I notice from receiving  this medicine? Side effects that you should report to your doctor or health care professional as soon as possible: -allergic reactions like skin rash, itching or hives, swelling of the face, lips, or tongue -shortness of breath or breathing problems -fever -pain, redness, or irritation at site where injected -pinpoint red spots on the skin -stomach or side pain, or pain at the shoulder -swelling -tiredness -trouble passing urine Side effects that usually do not require medical attention (report to your doctor or health care professional if they continue or are bothersome): -bone pain -muscle pain Where should I keep my medicine? Keep out of the reach of children. Store in a refrigerator between 2 and 8 degrees C (36 and 46 degrees F). Keep in carton to protect from light. Throw away this medicine if it is left out of the refrigerator for more than 5 consecutive days. Throw away any unused medicine after the expiration date.  2017 Elsevier/Gold Standard (2015-07-18 12:15:25)  

## 2016-05-25 NOTE — Progress Notes (Signed)
Inj RN gave Granix 300 mcg prior to check from pharmacy. Left message w/ Jonelle Sports, RN who will check w/ MD today about getting repeat CBC prior to more Granix (on sched tomorrow 05/26/16 for another inj). Last Gemzar was 1 week ago. Kennith Center, Pharm.D., CPP 05/25/2016@3 :28 PM

## 2016-05-26 ENCOUNTER — Ambulatory Visit (HOSPITAL_BASED_OUTPATIENT_CLINIC_OR_DEPARTMENT_OTHER): Payer: BLUE CROSS/BLUE SHIELD

## 2016-05-26 ENCOUNTER — Ambulatory Visit
Admission: RE | Admit: 2016-05-26 | Discharge: 2016-05-26 | Disposition: A | Discharge status: Home / Self Care | Payer: BLUE CROSS/BLUE SHIELD | Source: Ambulatory Visit | Attending: Radiation Oncology | Admitting: Radiation Oncology

## 2016-05-26 ENCOUNTER — Encounter: Payer: Self-pay | Admitting: Radiation Oncology

## 2016-05-26 VITALS — BP 111/80 | HR 125 | Temp 97.8°F | Resp 18 | Ht 65.0 in | Wt 152.4 lb

## 2016-05-26 DIAGNOSIS — C7931 Secondary malignant neoplasm of brain: Secondary | ICD-10-CM | POA: Diagnosis not present

## 2016-05-26 DIAGNOSIS — R911 Solitary pulmonary nodule: Secondary | ICD-10-CM | POA: Insufficient documentation

## 2016-05-26 DIAGNOSIS — C50919 Malignant neoplasm of unspecified site of unspecified female breast: Secondary | ICD-10-CM | POA: Diagnosis present

## 2016-05-26 DIAGNOSIS — C7800 Secondary malignant neoplasm of unspecified lung: Secondary | ICD-10-CM

## 2016-05-26 DIAGNOSIS — C7951 Secondary malignant neoplasm of bone: Secondary | ICD-10-CM | POA: Insufficient documentation

## 2016-05-26 DIAGNOSIS — C50912 Malignant neoplasm of unspecified site of left female breast: Secondary | ICD-10-CM

## 2016-05-26 DIAGNOSIS — Z9012 Acquired absence of left breast and nipple: Secondary | ICD-10-CM | POA: Diagnosis not present

## 2016-05-26 DIAGNOSIS — J90 Pleural effusion, not elsewhere classified: Secondary | ICD-10-CM | POA: Diagnosis not present

## 2016-05-26 DIAGNOSIS — C787 Secondary malignant neoplasm of liver and intrahepatic bile duct: Secondary | ICD-10-CM | POA: Insufficient documentation

## 2016-05-26 DIAGNOSIS — Z170 Estrogen receptor positive status [ER+]: Secondary | ICD-10-CM | POA: Diagnosis not present

## 2016-05-26 DIAGNOSIS — Z8701 Personal history of pneumonia (recurrent): Secondary | ICD-10-CM | POA: Diagnosis not present

## 2016-05-26 DIAGNOSIS — Z5189 Encounter for other specified aftercare: Secondary | ICD-10-CM

## 2016-05-26 MED ORDER — TBO-FILGRASTIM 300 MCG/0.5ML ~~LOC~~ SOSY
300.0000 ug | PREFILLED_SYRINGE | Freq: Once | SUBCUTANEOUS | Status: AC
Start: 2016-05-26 — End: 2016-05-26
  Administered 2016-05-26: 300 ug via SUBCUTANEOUS
  Filled 2016-05-26: qty 0.5

## 2016-05-26 NOTE — Patient Instructions (Signed)
Tbo-Filgrastim injection What is this medicine? TBO-FILGRASTIM (T B O fil GRA stim) is a granulocyte colony-stimulating factor that stimulates the growth of neutrophils, a type of white blood cell important in the body's fight against infection. It is used to reduce the incidence of fever and infection in patients with certain types of cancer who are receiving chemotherapy that affects the bone marrow. COMMON BRAND NAME(S): Granix What should I tell my health care provider before I take this medicine? They need to know if you have any of these conditions: -ongoing radiation therapy -sickle cell anemia -an unusual or allergic reaction to tbo-filgrastim, filgrastim, pegfilgrastim, other medicines, foods, dyes, or preservatives -pregnant or trying to get pregnant -breast-feeding How should I use this medicine? This medicine is for injection under the skin. If you get this medicine at home, you will be taught how to prepare and give this medicine. Refer to the Instructions for Use that come with your medication packaging. Use exactly as directed. Take your medicine at regular intervals. Do not take your medicine more often than directed. It is important that you put your used needles and syringes in a special sharps container. Do not put them in a trash can. If you do not have a sharps container, call your pharmacist or healthcare provider to get one. Talk to your pediatrician regarding the use of this medicine in children. Special care may be needed. What if I miss a dose? It is important not to miss your dose. Call your doctor or health care professional if you miss a dose. What may interact with this medicine? This medicine may interact with the following medications: -medicines that may cause a release of neutrophils, such as lithium What should I watch for while using this medicine? You may need blood work done while you are taking this medicine. What side effects may I notice from receiving  this medicine? Side effects that you should report to your doctor or health care professional as soon as possible: -allergic reactions like skin rash, itching or hives, swelling of the face, lips, or tongue -shortness of breath or breathing problems -fever -pain, redness, or irritation at site where injected -pinpoint red spots on the skin -stomach or side pain, or pain at the shoulder -swelling -tiredness -trouble passing urine Side effects that usually do not require medical attention (report to your doctor or health care professional if they continue or are bothersome): -bone pain -muscle pain Where should I keep my medicine? Keep out of the reach of children. Store in a refrigerator between 2 and 8 degrees C (36 and 46 degrees F). Keep in carton to protect from light. Throw away this medicine if it is left out of the refrigerator for more than 5 consecutive days. Throw away any unused medicine after the expiration date.  2017 Elsevier/Gold Standard (2015-07-18 12:15:25)  

## 2016-05-26 NOTE — Progress Notes (Signed)
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   Interval Since Last Radiation:  6 weeks   03/30/16 - 04/10/16:  The whole brain was treated to 30Gy in 33fractions of 3Gy.  01/10/15-01/23/15: Metastatic deposits in the sacrum and femurs were conformally treated to 30 Gy in 10 fractions of 3 Gy  12/15/07-01/31/08: 50.4 Gy to the chest wall with supraclavicular and left axillary field with a boost to the mastectomy scar to total 60.4 Gy  Narrative:  The patient returns today for routine follow-up. During treatment she did very well with radiotherapy, however did have some trouble with tapering her steroids prompting a hospitalization of generalized numbness. She had an MRI of the brain on 04/15/2016 which showed improvement actually of her treated disease in comparison to her outside films. She was admitted between 11/19-22/17 and was diagnosed with a healthcare acquired pneumonia.                        On review of systems, the patient states she is feeling better since her last visit, and states that despite the fact that she is having some fatigue, she denies any nausea or vomiting or ataxia. She is not experiencing any auditory changes, difficulty with fine motor skills, or changes in her vision her cognition. She denies any numbness that she previously experienced in a generalized manner, and denies any difficulty with speech. She has been off dexamethasone for approximately 2 weeks time. She is anxious for the effects to regress in terms of mood facies, and states that she will proceed with cycle #3 of gemcitabine this Friday. She has also transferred her care to Dr. Jana Hakim. No other complaints or  verbalized.  ALLERGIES:  has No Known Allergies.  Meds: Current Outpatient Prescriptions  Medication Sig Dispense Refill  . docusate sodium (COLACE) 100 MG capsule Take 100 mg by mouth daily.     . fluconazole (DIFLUCAN) 100 MG tablet Take 1 tablet (100 mg total) by mouth daily as needed (for thrush). 10 tablet 0  . levofloxacin (LEVAQUIN) 750 MG tablet Take 1 tablet (750 mg total) by mouth daily. 7 tablet 0  . lidocaine-prilocaine (EMLA) cream Apply 1 application topically as needed. To port 1 hour before going to be accessed with needle. Cover with plastic wrap. 30 g 1  . lisinopril (PRINIVIL,ZESTRIL) 10 MG tablet Take 1 tablet (10 mg total) by mouth daily. 30 tablet 0  . oxyCODONE (OXYCONTIN) 30 MG 12 hr tablet Take 30 mg by mouth 2 (two) times daily. 60 each 0  . Oxycodone HCl 10 MG TABS Take 1 tablet by mouth every 3 (three) hours as needed for pain.    . pantoprazole (PROTONIX) 40 MG tablet TAKE 1 TABLET(40 MG) BY MOUTH TWICE DAILY BEFORE A MEAL 60 tablet 5  . polyethylene glycol (MIRALAX / GLYCOLAX) packet Take 17 grams by mouth every day.    Marland Kitchen acetaminophen (TYLENOL) 500 MG tablet Take 1,000 mg by mouth every 6 (six) hours as needed.    . diphenhydrAMINE (BENADRYL) 25 mg capsule Take 25 mg by mouth at bedtime as needed.    Marland Kitchen ibuprofen (ADVIL,MOTRIN) 200 MG tablet Take 400 mg by mouth daily  as needed.    . lactose free nutrition (BOOST PLUS) LIQD Take 237 mLs by mouth 2 (two) times daily between meals. (Patient not taking: Reported on 05/26/2016) 237 mL 0   No current facility-administered medications for this encounter.    Facility-Administered Medications Ordered in Other Encounters  Medication Dose Route Frequency Provider Last Rate Last Dose  . filgrastim (NEUPOGEN) injection 300 mcg  300 mcg Subcutaneous Once [name], MD        Physical Findings:  height is 5\' 5"  (1.651 m) and weight is 152 lb 6.4 oz (69.1 kg). Her oral temperature is 97.8 F (36.6 C). Her blood  pressure is 111/80 and her pulse is 125 (abnormal). Her respiration is 18 and oxygen saturation is 97%.  In general this is a well appearing Caucasian, [place] female in no acute distress. She has stigmata of steroid use with moon facies and buffalo hump. She's alert and oriented x4 and appropriate throughout the examination. Cardiopulmonary assessment is negative for acute distress and she exhibits normal effort.   Lab Findings: Lab Results  Component Value Date   WBC 2.6 (L) 05/20/2016   HGB 8.9 (L) 05/20/2016   HCT 26.3 (L) 05/20/2016   MCV 102.7 (H) 05/20/2016   PLT 192 05/20/2016     Radiographic Findings: Dg Chest 2 View  Result Date: 05/17/2016 CLINICAL DATA:  Fever.  Breast cancer, chemotherapy patient. EXAM: CHEST  2 VIEW COMPARISON:  05/04/2016 FINDINGS: Right Port-A-Cath is in place with the tip in the SVC. Increasing bibasilar airspace opacities since prior study which could represent atelectasis or infiltrate. Area most concerning for pneumonia is in the right lung base. No effusions. Heart is normal size. IMPRESSION: Increasing bibasilar atelectasis or infiltrates. Cannot exclude pneumonia, particularly in the right base. Electronically Signed   By: Rolm Baptise M.D.   On: 05/17/2016 08:47   Dg Chest 2 View  Result Date: 05/04/2016 CLINICAL DATA:  Several hours of shortness of breath. Difficulty swallowing. Stage IV breast malignancy metastatic to the bones currently on chemo radiation. History of previous left mastectomy. EXAM: CHEST  2 VIEW COMPARISON:  Chest CT scan dated March 16, 2016 and PA and lateral chest x-ray of August 08, 2014. FINDINGS: The lungs are mildly hypo inflated. Subtle increased density is noted at both lung bases. There is no pleural effusion or pneumothorax. The heart and pulmonary vascularity are normal. The mediastinum is normal in width. The power port catheter tip projects over the midportion of the SVC. The observed portions of the upper  abdomen are normal. The left breast shadow is absent. The bony structures exhibit no acute abnormalities. IMPRESSION: Bibasilar atelectasis or infiltrate. The possibility of lymphatic spread of malignancy was raised on the previous CT scan. No CHF. No pulmonary parenchymal masses are observed. If the patient's symptoms might be related to pneumonia, follow-up PA and lateral chest X-ray would be recommended in 3-4 weeks following trial of antibiotic therapy to ensure resolution. Recommended in 3-4 weeks following trial of antibiotic therapy to ensure resolution. If lymphatic spread of malignancy is felt be most likely, chest CT scanning now would be recommended. Electronically Signed   By: David  Martinique M.D.   On: 05/04/2016 12:47   Ct Angio Chest Pe W Or Wo Contrast  Result Date: 05/17/2016 CLINICAL DATA:  History of left-sided breast carcinoma. Presenting with fever and shortness of breath. Tachycardia. EXAM: CT ANGIOGRAPHY CHEST WITH CONTRAST TECHNIQUE: Multidetector CT imaging of the chest was performed using the standard  protocol during bolus administration of intravenous contrast. Multiplanar CT image reconstructions and MIPs were obtained to evaluate the vascular anatomy. CONTRAST:  50 mL Isovue 370 nonionic COMPARISON:  Chest CT angiogram May 04, 2016; chest radiograph May 17, 2016 FINDINGS: Cardiovascular: There is no demonstrable pulmonary embolus. There is no thoracic aortic aneurysm or dissection. The visualized great vessels appear normal. There is slight calcification in the aorta. Pericardium is not appreciably thickened. Port-A-Cath tip is in the superior vena cava. Mediastinum/Nodes: Visualized thyroid appears unremarkable. There is no appreciable adenopathy in the thoracic region. Lungs/Pleura: Multiple foci of ground-glass type opacity are noted throughout both upper lobes, a change from recent prior study. There is a stable 6 mm nodular opacity in the posterior right apex, seen  currently on axial slice 14 series 10. There is focal airspace consolidation in the lateral segment of the right middle lobe. There is atelectatic change in both lower lobes. There is a new small pleural effusion on each side. There is atelectatic change in both lung bases, increased from recent prior study. There is patchy ground-glass opacity noted in the periphery of the lingula, primarily in the inferior segment, a change from recent study. Upper Abdomen: In the visualized upper abdomen, there is widespread liver metastatic disease. Gallbladder is absent. Visualized upper abdominal structures appear unremarkable elsewhere. Musculoskeletal: There is widespread sclerotic metastatic disease throughout multiple sites in the thoracic and lower cervical spine. There is sclerotic metastatic disease in the superior right scapula as well as in several ribs bilaterally. Review of the MIP images confirms the above findings. IMPRESSION: No demonstrable pulmonary embolus. Areas of ground-glass opacity in both upper lobes and in the lingular regions, likely pneumonia. There is also persistent consolidation in the lateral segment right middle lobe, consistent with pneumonia. There are new small pleural effusions bilaterally with patchy atelectasis in lung bases. It should be noted that at least some of the areas of ground-glass opacity in the upper lung regions potentially could represent a degree of lymphangitic spread of tumor. In this regard, a follow-up study in 4-6 weeks may be reasonable to further assess these changes in the lungs. Extensive bony metastatic disease as well as extensive lower metastatic disease noted. Electronically Signed   By: Lowella Grip III M.D.   On: 05/17/2016 15:29   Ct Angio Chest Pe W And/or Wo Contrast  Result Date: 05/04/2016 CLINICAL DATA:  Cough with shortness of breath history of metastatic breast cancer to bone liver lung and brain EXAM: CT ANGIOGRAPHY CHEST WITH CONTRAST  TECHNIQUE: Multidetector CT imaging of the chest was performed using the standard protocol during bolus administration of intravenous contrast. Multiplanar CT image reconstructions and MIPs were obtained to evaluate the vascular anatomy. CONTRAST:  66 mL Isovue 370 intravenous COMPARISON:  Chest x-ray 05/04/2016, CT 03/16/2016 FINDINGS: Cardiovascular: Satisfactory opacification of the pulmonary arteries to the segmental level. No evidence of pulmonary embolism. Normal heart size. No pericardial effusion. Vascular catheter tip at the proximal right atrium. Mediastinum/Nodes: No enlarged mediastinal, hilar, or axillary lymph nodes. Thyroid gland, trachea, and esophagus demonstrate no significant findings. Lungs/Pleura: Stable 5 mm mildly spiculated nodule in the right upper lobe, series 7, image number 21. Previously noted left perifissural nodule not as well appreciated on the current exam. Again visualized is diffuse septal thickening, right greater than left, findings have progressed bilaterally. Hazy attenuation is present within the upper lobes and bilateral lower lobes. There is partial atelectasis or consolidation in the right middle lobe and left lower lobe.  Upper Abdomen: Diffuse decreased density of the liver, consistent with fatty changes. Poorly defined enhancing masses are present within the liver, suspected to be increased although evaluation limited due to arterial phase exam. Surgical clips in the gallbladder fossa. Musculoskeletal: Again visualized is extensive skeletal metastatic disease of the spine and bilateral ribs, grossly similar compared to prior study. Review of the MIP images confirms the above findings. IMPRESSION: 1. No CT evidence for acute pulmonary embolus or aortic dissection. 2. Progression of diffuse bilateral septal thickening, right greater and than left. Concern previously raised for lymphangitic spread of tumor. Stable 5 mm spiculated nodule right upper lobe. Interim finding of  partial atelectasis or consolidation in the right middle lobe and left lower lobe. 3. Multiple poorly visualized hepatic masses with fatty changes of the liver. Suspect that the masses have increased in size . 4. Extensive skeletal metastatic disease, grossly unchanged. Electronically Signed   By: Donavan Foil M.D.   On: 05/04/2016 14:39    Impression/Plan: 1. 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 The patient appears to be doing better in the last few days in comparison to her recent diagnosis of healthcare acquired pneumonia, from a CNS perspective, appears to be doing well without sequela of her disease or treatment. We will proceed with repeat imaging of the brain 3 months from the time of her treatment, and see her back to discuss these results once they are presented in multidisciplinary conference.she will keep [place] informed questions or concerns that arise prior to that visit, and also continue with systemic gemcitabine under the care of Dr. Jana Hakim.     Carola Rhine, PAC

## 2016-05-26 NOTE — Addendum Note (Signed)
Encounter addended by: Malena Edman, RN on: 05/26/2016  4:06 PM<BR>    Actions taken: Charge Capture section accepted

## 2016-05-29 ENCOUNTER — Ambulatory Visit (HOSPITAL_BASED_OUTPATIENT_CLINIC_OR_DEPARTMENT_OTHER): Payer: BLUE CROSS/BLUE SHIELD

## 2016-05-29 ENCOUNTER — Ambulatory Visit (HOSPITAL_BASED_OUTPATIENT_CLINIC_OR_DEPARTMENT_OTHER): Payer: BLUE CROSS/BLUE SHIELD | Admitting: Oncology

## 2016-05-29 ENCOUNTER — Ambulatory Visit: Payer: BLUE CROSS/BLUE SHIELD

## 2016-05-29 ENCOUNTER — Other Ambulatory Visit (HOSPITAL_BASED_OUTPATIENT_CLINIC_OR_DEPARTMENT_OTHER): Payer: BLUE CROSS/BLUE SHIELD

## 2016-05-29 VITALS — BP 112/87 | HR 102 | Temp 98.2°F | Resp 18 | Ht 65.0 in | Wt 157.8 lb

## 2016-05-29 DIAGNOSIS — C50212 Malignant neoplasm of upper-inner quadrant of left female breast: Secondary | ICD-10-CM

## 2016-05-29 DIAGNOSIS — Z170 Estrogen receptor positive status [ER+]: Secondary | ICD-10-CM

## 2016-05-29 DIAGNOSIS — Z95828 Presence of other vascular implants and grafts: Secondary | ICD-10-CM

## 2016-05-29 DIAGNOSIS — C7800 Secondary malignant neoplasm of unspecified lung: Secondary | ICD-10-CM

## 2016-05-29 DIAGNOSIS — C787 Secondary malignant neoplasm of liver and intrahepatic bile duct: Secondary | ICD-10-CM

## 2016-05-29 DIAGNOSIS — C7951 Secondary malignant neoplasm of bone: Principal | ICD-10-CM

## 2016-05-29 DIAGNOSIS — C7931 Secondary malignant neoplasm of brain: Secondary | ICD-10-CM

## 2016-05-29 DIAGNOSIS — C7949 Secondary malignant neoplasm of other parts of nervous system: Secondary | ICD-10-CM

## 2016-05-29 DIAGNOSIS — C50912 Malignant neoplasm of unspecified site of left female breast: Secondary | ICD-10-CM

## 2016-05-29 DIAGNOSIS — D759 Disease of blood and blood-forming organs, unspecified: Secondary | ICD-10-CM

## 2016-05-29 DIAGNOSIS — C7802 Secondary malignant neoplasm of left lung: Secondary | ICD-10-CM

## 2016-05-29 DIAGNOSIS — Z5111 Encounter for antineoplastic chemotherapy: Secondary | ICD-10-CM

## 2016-05-29 LAB — CBC WITH DIFFERENTIAL/PLATELET
BASO%: 0.8 % (ref 0.0–2.0)
Basophils Absolute: 0 10*3/uL (ref 0.0–0.1)
EOS%: 1.1 % (ref 0.0–7.0)
Eosinophils Absolute: 0.1 10*3/uL (ref 0.0–0.5)
HCT: 31.2 % — ABNORMAL LOW (ref 34.8–46.6)
HEMOGLOBIN: 10.2 g/dL — AB (ref 11.6–15.9)
LYMPH%: 22.9 % (ref 14.0–49.7)
MCH: 33.8 pg (ref 25.1–34.0)
MCHC: 32.6 g/dL (ref 31.5–36.0)
MCV: 103.6 fL — ABNORMAL HIGH (ref 79.5–101.0)
MONO#: 1 10*3/uL — ABNORMAL HIGH (ref 0.1–0.9)
MONO%: 20 % — AB (ref 0.0–14.0)
NEUT%: 55.2 % (ref 38.4–76.8)
NEUTROS ABS: 2.9 10*3/uL (ref 1.5–6.5)
Platelets: 232 10*3/uL (ref 145–400)
RBC: 3.01 10*6/uL — AB (ref 3.70–5.45)
RDW: 15.8 % — AB (ref 11.2–14.5)
WBC: 5.2 10*3/uL (ref 3.9–10.3)
lymph#: 1.2 10*3/uL (ref 0.9–3.3)

## 2016-05-29 LAB — COMPREHENSIVE METABOLIC PANEL
ALBUMIN: 2.6 g/dL — AB (ref 3.5–5.0)
ALK PHOS: 220 U/L — AB (ref 40–150)
ALT: 37 U/L (ref 0–55)
AST: 26 U/L (ref 5–34)
Anion Gap: 8 mEq/L (ref 3–11)
BUN: 6.5 mg/dL — AB (ref 7.0–26.0)
CO2: 24 meq/L (ref 22–29)
Calcium: 8.4 mg/dL (ref 8.4–10.4)
Chloride: 105 mEq/L (ref 98–109)
Creatinine: 0.7 mg/dL (ref 0.6–1.1)
EGFR: >90 mL/min/{1.73_m2} (ref >90)
GLUCOSE: 124 mg/dL (ref 70–140)
POTASSIUM: 3.6 meq/L (ref 3.5–5.1)
SODIUM: 137 meq/L (ref 136–145)
TOTAL PROTEIN: 5.7 g/dL — AB (ref 6.4–8.3)
Total Bilirubin: 0.34 mg/dL (ref 0.20–1.20)

## 2016-05-29 MED ORDER — SODIUM CHLORIDE 0.9% FLUSH
10.0000 mL | INTRAVENOUS | Status: DC | PRN
  Administered 2016-05-29: 10 mL via INTRAVENOUS
  Filled 2016-05-29: qty 10

## 2016-05-29 MED ORDER — PROCHLORPERAZINE MALEATE 10 MG PO TABS
ORAL_TABLET | ORAL | Status: AC
  Filled 2016-05-29: qty 1

## 2016-05-29 MED ORDER — SODIUM CHLORIDE 0.9 % IV SOLN
Freq: Once | INTRAVENOUS | Status: AC
  Administered 2016-05-29: 10:00:00 via INTRAVENOUS

## 2016-05-29 MED ORDER — HEPARIN SOD (PORK) LOCK FLUSH 100 UNIT/ML IV SOLN
500.0000 [IU] | Freq: Once | INTRAVENOUS | Status: AC | PRN
  Administered 2016-05-29: 500 [IU]
  Filled 2016-05-29: qty 5

## 2016-05-29 MED ORDER — SODIUM CHLORIDE 0.9% FLUSH
10.0000 mL | INTRAVENOUS | Status: DC | PRN
  Administered 2016-05-29: 10 mL
  Filled 2016-05-29: qty 10

## 2016-05-29 MED ORDER — PROCHLORPERAZINE MALEATE 10 MG PO TABS
10.0000 mg | ORAL_TABLET | Freq: Once | ORAL | Status: AC
  Administered 2016-05-29: 10 mg via ORAL

## 2016-05-29 MED ORDER — SODIUM CHLORIDE 0.9 % IV SOLN
600.0000 mg/m2 | Freq: Once | INTRAVENOUS | Status: AC
  Administered 2016-05-29: 1064 mg via INTRAVENOUS
  Filled 2016-05-29: qty 27.98

## 2016-05-29 MED ORDER — ACETAMINOPHEN 325 MG PO TABS
325.0000 mg | ORAL_TABLET | Freq: Once | ORAL | Status: AC
  Administered 2016-05-29: 325 mg via ORAL

## 2016-05-29 MED ORDER — ACETAMINOPHEN 325 MG PO TABS
ORAL_TABLET | ORAL | Status: AC
  Filled 2016-05-29: qty 1

## 2016-05-29 NOTE — Progress Notes (Signed)
[facility]  [phone] [phone]     ID: Jill Johnston DOB: 12/11/1965  MR#: [id number]  CSN#:[id number]  Patient Care Team: Jill M Darovsky, MD as PCP - General (Internal Medicine) Jill Manning, MD as Consulting Physician (Radiation Oncology) Jill Johnston as Consulting Physician (Internal Medicine) , C, MD OTHER MD:  CHIEF COMPLAINT: Estrogen receptor positive metastatic breast cancer  CURRENT TREATMENT: Gemcitabine   BREAST CANCER HISTORY: From Jill Johnston's summary note 05/08/2016:  "Patient was in excellent health until diagnosed with multifocal cT2 cN2 Mx carcinoma upper inner quadrant left breast 05-17-2007. The breast cancer was ER + 100%, PR 1%, HER 2 IHC/FISH negative, Ki67 52%, BRCA 1/2 negative, initial CA 2729 WNL. She had neoadjuvant AC x4 taxol x12 dose dense from 06-10-2007 thru 09-28-2007 by Jill JillJohnston. Surgery 10-27-2007 was left modified radical mastectomy with sentinel nodes I and II axillary dissection by Jill Jill Johnston at [facility], pT2pN1a pMx, +LMI, grade 2/3, DCIS +, clinical stage IIB. She had radiation to chest wall, supraclavicular region and left axilla 50.4 Gy with boost to mastectomy scar to 60.4 Gy, by Jill Johnston from 12-15-2007 thru 01-31-2008. She was on tamoxifen from 02-20-2008 thru 09-25-2014. She developed pain in ribs 08-2014, with CA 2729 52 and PET CT and MRI with diffuse bone metastatic disease, no cord compression, small lung lesions and intrathoracic adenopathy. 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 She began Denosumab 120 mcg monthly starting 4-7-201, and letrozole + palbociclib beginning4-12-2014. She was seen in consultation by Jill Jill Johnston 09-2014. Palbociclib dose decreased to 100 mg daily x 21 q 28 days due to neutropenia. CBC on 10-25-15: WBC 2.1, Hgb 13, plt 186, ANC 0.9 PET 09-25-2014 in [facility] and CT CAP at  [facility] 10-25-15 with no clear pulmonary involvement, stable 2 cm lesion at dome of liver and 11 mm left hepatic lobe lesion. [facility], with heterogeneously dense breast tissue but no other mammographic findings of concern.  CA 2729 increased from 38 in 08-2015 to 49 in April 2017 and 65 in May 2017. CT CAP / PET 03-16-16 showed progression lung, liver and bone. Letrozole and ibrance DCd on 03-19-16, with plans to begin chemotherapy. She had acute neurologic symptoms 03-21-16 in [place], with MRI MRI head reportedly showed left parietal metastasis and concern for leptomeningeal spread. She elected whole brain RT, given by Jill Johnston ~ 03-23-16 thru 04-10-16.  She had first gemzar on 04-24-16  She had evaluation for vaginal bleeding "from polyp", follows yearly with gynecologist in [place], up to date."  Her subsequent history is as detailed below.  INTERVAL HISTORY: Jill Johnston returns today for follow-up of her metastatic breast cancer accompanied by her husband Jill Johnston. Today is day 1 cycle 2 of Gemzar, which she is scheduled to receive day 1 and day 8 of each 21 day cycle. Because of cytopenias and dose delays her dose has been reduced and Neupogen was added  REVIEW OF SYSTEMS: Jill Johnston has completed the antibiotics for her pneumonia. She has no cough or breathing problems at present. Her appetite is up and down but she has no headaches, minimal nausea, no vomiting, and her vision is okay. She is on 30 mg of OxyContin twice daily and that pretty much takes care of her pain. She has oxycodone for breakthrough but she is not using that at present. She has good energy in   the morning, then gets very sleepy in the afternoon, that is up at all hours of the night until about midnight. Overall she tells me she is doing "better". Incidentally she had no problems with her Neupogen shots a detailed review of systems today was otherwise stable   PAST MEDICAL HISTORY: Past  Medical History:  Diagnosis Date  . Anxiety   . 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 Breast cancer metastasized to multiple sites ([place]) 04/12/2016   bone, liver, lung and brain  . GERD (gastroesophageal reflux disease)   . Hypertension     PAST SURGICAL HISTORY: Past Surgical History:  Procedure Laterality Date  . CHOLECYSTECTOMY    . ESOPHAGOGASTRODUODENOSCOPY (EGD) WITH PROPOFOL N/A 07/12/2015   Procedure: ESOPHAGOGASTRODUODENOSCOPY (EGD) WITH PROPOFOL;  Surgeon: Jill Houston, MD;  Location: [facility];  Service: Endoscopy;  Laterality: N/A;  1:00  . IR GENERIC HISTORICAL  04/22/2016   IR US GUIDE VASC ACCESS RIGHT 04/22/2016 Jill Daft, MD [facility]  . IR GENERIC HISTORICAL  04/22/2016   IR FLUORO GUIDE PORT INSERTION RIGHT 04/22/2016 Jill Daft, MD [facility]  . MASTECTOMY  April 2009   Left breast with lymph node resection    FAMILY HISTORY Family History  Problem Relation Age of Onset  . Heart attack Mother     Died age 24  . Heart disease Father     Died age 27  . Brain cancer Father     pt not sure if he actually had this  . Cancer Maternal Grandmother     dx. NOS cancer at older age; d. 54  . Heart attack Maternal Grandfather 79  . Colon cancer Paternal Grandmother     d. 70s  The patient's father died at age 31 she believes from heart disease. The patient's mother died at age 17 also from a myocardial infarction. The patient had one brother, no sisters. There is no history of breast or ovarian cancer in the family despite extensive G neurologic research   GYNECOLOGIC HISTORY:  No LMP recorded. Patient is not currently having periods (Reason: Chemotherapy).  menarche age 54, first live birth age 61, the patient is GX P1. She stopped having periods with chemotherapy in 2009 and these have not resumed.   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 They recently moved to [place] from [place]. Their son Jill Johnston is 92 years old as of November 2017 . The patient is [place] [employer]      ADVANCED DIRECTIVES:  the patient's husband is her healthcare power of attorney. There is no living will in place    HEALTH MAINTENANCE: Social History  Substance Use Topics  . Smoking status: Never Smoker  . Smokeless tobacco: Never Used  . Alcohol use No     Colonoscopy:  PAP:  Bone density:   No Known Allergies  Current Outpatient Prescriptions  Medication Sig Dispense Refill  . docusate sodium (COLACE) 100 MG capsule Take 100 mg by mouth daily.     . fluconazole (DIFLUCAN) 100 MG tablet Take 1 tablet (100 mg total) by mouth daily as needed (for thrush). 10 tablet 0  . ibuprofen (ADVIL,MOTRIN) 200 MG tablet Take 400 mg by mouth daily as needed.    . lidocaine-prilocaine (EMLA) cream Apply 1 application topically as needed. To  port 1 hour before going to be accessed with needle. Cover with plastic wrap. 30 g 1  . lisinopril (PRINIVIL,ZESTRIL) 10 MG tablet Take 1 tablet (10 mg total) by mouth daily. 30 tablet 0  . oxyCODONE (OXYCONTIN) 30 MG 12 hr tablet Take 30 mg by mouth 2 (two) times daily. 60 each 0  . Oxycodone HCl 10 MG TABS Take 1 tablet by mouth every 3 (three) hours as needed for pain.    . pantoprazole (PROTONIX) 40 MG tablet TAKE 1 TABLET(40 MG) BY MOUTH TWICE DAILY BEFORE A MEAL 60 tablet 5  . polyethylene glycol (MIRALAX / GLYCOLAX) packet Take 17 grams by mouth every day.     No current facility-administered medications for this visit.    Facility-Administered Medications Ordered in Other Visits  Medication Dose Route Frequency Provider Last Rate Last Dose  . filgrastim (NEUPOGEN) injection 300 mcg  300 mcg Subcutaneous Once  C , MD        OBJECTIVE: Middle-aged white woman In no acute distress  Vitals:   05/29/16 0923  BP: 112/87  Pulse:  (!) 102  Resp: 18  Temp: 98.2 F (36.8 C)     Body mass index is 26.26 kg/m.    ECOG FS:2 - Symptomatic, <50% confined to bed  Sclerae unicteric, EOMs intact, you pulse equal round and reactive Oropharynx clear and moist No cervical or supraclavicular adenopathy Lungs no rales or rhonchi Heart regular rate and rhythm Abd soft, nontender, positive bowel sounds MSK no focal spinal tenderness, no upper extremity lymphedema Neuro: nonfocal, well oriented, positive affect Breasts: Status post left mastectomy with no evidence of chest wall recurrence. The left axilla is benign.  LAB RESULTS:  CMP     Component Value Date/Time   NA 137 05/29/2016 0856   K 3.6 05/29/2016 0856   CL 105 05/20/2016 0930   CO2 24 05/29/2016 0856   GLUCOSE 124 05/29/2016 0856   BUN 6.5 (L) 05/29/2016 0856   CREATININE 0.7 05/29/2016 0856   CALCIUM 8.4 05/29/2016 0856   PROT 5.7 (L) 05/29/2016 0856   ALBUMIN 2.6 (L) 05/29/2016 0856   AST 26 05/29/2016 0856   ALT 37 05/29/2016 0856   ALKPHOS 220 (H) 05/29/2016 0856   BILITOT 0.34 05/29/2016 0856   GFRNONAA >60 05/20/2016 0930   GFRAA >60 05/20/2016 0930    INo results found for: SPEP, UPEP  Lab Results  Component Value Date   WBC 5.2 05/29/2016   NEUTROABS 2.9 05/29/2016   HGB 10.2 (L) 05/29/2016   HCT 31.2 (L) 05/29/2016   MCV 103.6 (H) 05/29/2016   PLT 232 05/29/2016      Chemistry      Component Value Date/Time   NA 137 05/29/2016 0856   K 3.6 05/29/2016 0856   CL 105 05/20/2016 0930   CO2 24 05/29/2016 0856   BUN 6.5 (L) 05/29/2016 0856   CREATININE 0.7 05/29/2016 0856      Component Value Date/Time   CALCIUM 8.4 05/29/2016 0856   ALKPHOS 220 (H) 05/29/2016 0856   AST 26 05/29/2016 0856   ALT 37 05/29/2016 0856   BILITOT 0.34 05/29/2016 0856       Lab Results  Component Value Date   LABCA2 98 (H) 02/27/2016    No components found for: LABCA125  No results for input(s): INR in the last 168 hours.  Urinalysis      Component Value Date/Time   COLORURINE ORANGE (A) 05/17/2016 0852   APPEARANCEUR CLOUDY (A) 05/17/2016 0852     LABSPEC 1.027 05/17/2016 0852   PHURINE 6.0 05/17/2016 0852   GLUCOSEU NEGATIVE 05/17/2016 0852   HGBUR NEGATIVE 05/17/2016 0852   BILIRUBINUR SMALL (A) 05/17/2016 0852   KETONESUR NEGATIVE 05/17/2016 0852   PROTEINUR 30 (A) 05/17/2016 0852   NITRITE NEGATIVE 05/17/2016 0852   LEUKOCYTESUR TRACE (A) 05/17/2016 0852     STUDIES: Dg Chest 2 View  Result Date: 05/17/2016 CLINICAL DATA:  Fever.  Breast cancer, chemotherapy patient. EXAM: CHEST  2 VIEW COMPARISON:  05/04/2016 FINDINGS: Right Port-A-Cath is in place with the tip in the SVC. Increasing bibasilar airspace opacities since prior study which could represent atelectasis or infiltrate. Area most concerning for pneumonia is in the right lung base. No effusions. Heart is normal size. IMPRESSION: Increasing bibasilar atelectasis or infiltrates. Cannot exclude pneumonia, particularly in the right base. Electronically Signed   By: Kevin  Dover M.D.   On: 05/17/2016 08:47   Dg Chest 2 View  Result Date: 05/04/2016 CLINICAL DATA:  Several hours of shortness of breath. Difficulty swallowing. Stage IV breast malignancy metastatic to the bones currently on chemo radiation. History of previous left mastectomy. EXAM: CHEST  2 VIEW COMPARISON:  Chest CT scan dated March 16, 2016 and PA and lateral chest x-ray of August 08, 2014. FINDINGS: The lungs are mildly hypo inflated. Subtle increased density is noted at both lung bases. There is no pleural effusion or pneumothorax. The heart and pulmonary vascularity are normal. The mediastinum is normal in width. The power port catheter tip projects over the midportion of the SVC. The observed portions of the upper abdomen are normal. The left breast shadow is absent. The bony structures exhibit no acute abnormalities. IMPRESSION: Bibasilar atelectasis or infiltrate. The possibility of lymphatic  spread of malignancy was raised on the previous CT scan. No CHF. No pulmonary parenchymal masses are observed. If the patient's symptoms might be related to pneumonia, follow-up PA and lateral chest X-ray would be recommended in 3-4 weeks following trial of antibiotic therapy to ensure resolution. Recommended in 3-4 weeks following trial of antibiotic therapy to ensure resolution. If lymphatic spread of malignancy is felt be most likely, chest CT scanning now would be recommended. Electronically Signed   By: David  Jordan M.D.   On: 05/04/2016 12:47   Ct Angio Chest Pe W Or Wo Contrast  Result Date: 05/17/2016 CLINICAL DATA:  History of left-sided breast carcinoma. Presenting with fever and shortness of breath. Tachycardia. EXAM: CT ANGIOGRAPHY CHEST WITH CONTRAST TECHNIQUE: Multidetector CT imaging of the chest was performed using the standard protocol during bolus administration of intravenous contrast. Multiplanar CT image reconstructions and MIPs were obtained to evaluate the vascular anatomy. CONTRAST:  50 mL Isovue 370 nonionic COMPARISON:  Chest CT angiogram May 04, 2016; chest radiograph May 17, 2016 FINDINGS: Cardiovascular: There is no demonstrable pulmonary embolus. There is no thoracic aortic aneurysm or dissection. The visualized great vessels appear normal. There is slight calcification in the aorta. Pericardium is not appreciably thickened. Port-A-Cath tip is in the superior vena cava. Mediastinum/Nodes: Visualized thyroid appears unremarkable. There is no appreciable adenopathy in the thoracic region. Lungs/Pleura: Multiple foci of ground-glass type opacity are noted throughout both upper lobes, a change from recent prior study. There is a stable 6 mm nodular opacity in the posterior right apex, seen currently on axial slice 14 series 10. There is focal airspace consolidation in the lateral segment of the right middle lobe. There is atelectatic change in both lower lobes. There is a new  small pleural   effusion on each side. There is atelectatic change in both lung bases, increased from recent prior study. There is patchy ground-glass opacity noted in the periphery of the lingula, primarily in the inferior segment, a change from recent study. Upper Abdomen: In the visualized upper abdomen, there is widespread liver metastatic disease. Gallbladder is absent. Visualized upper abdominal structures appear unremarkable elsewhere. Musculoskeletal: There is widespread sclerotic metastatic disease throughout multiple sites in the thoracic and lower cervical spine. There is sclerotic metastatic disease in the superior right scapula as well as in several ribs bilaterally. Review of the MIP images confirms the above findings. IMPRESSION: No demonstrable pulmonary embolus. Areas of ground-glass opacity in both upper lobes and in the lingular regions, likely pneumonia. There is also persistent consolidation in the lateral segment right middle lobe, consistent with pneumonia. There are new small pleural effusions bilaterally with patchy atelectasis in lung bases. It should be noted that at least some of the areas of ground-glass opacity in the upper lung regions potentially could represent a degree of lymphangitic spread of tumor. In this regard, a follow-up study in 4-6 weeks may be reasonable to further assess these changes in the lungs. Extensive bony metastatic disease as well as extensive lower metastatic disease noted. Electronically Signed   By: William  Woodruff III M.D.   On: 05/17/2016 15:29   Ct Angio Chest Pe W And/or Wo Contrast  Result Date: 05/04/2016 CLINICAL DATA:  Cough with shortness of breath history of metastatic breast cancer to bone liver lung and brain EXAM: CT ANGIOGRAPHY CHEST WITH CONTRAST TECHNIQUE: Multidetector CT imaging of the chest was performed using the standard protocol during bolus administration of intravenous contrast. Multiplanar CT image reconstructions and MIPs were  obtained to evaluate the vascular anatomy. CONTRAST:  66 mL Isovue 370 intravenous COMPARISON:  Chest x-ray 05/04/2016, CT 03/16/2016 FINDINGS: Cardiovascular: Satisfactory opacification of the pulmonary arteries to the segmental level. No evidence of pulmonary embolism. Normal heart size. No pericardial effusion. Vascular catheter tip at the proximal right atrium. Mediastinum/Nodes: No enlarged mediastinal, hilar, or axillary lymph nodes. Thyroid gland, trachea, and esophagus demonstrate no significant findings. Lungs/Pleura: Stable 5 mm mildly spiculated nodule in the right upper lobe, series 7, image number 21. Previously noted left perifissural nodule not as well appreciated on the current exam. Again visualized is diffuse septal thickening, right greater than left, findings have progressed bilaterally. Hazy attenuation is present within the upper lobes and bilateral lower lobes. There is partial atelectasis or consolidation in the right middle lobe and left lower lobe. Upper Abdomen: Diffuse decreased density of the liver, consistent with fatty changes. Poorly defined enhancing masses are present within the liver, suspected to be increased although evaluation limited due to arterial phase exam. Surgical clips in the gallbladder fossa. Musculoskeletal: Again visualized is extensive skeletal metastatic disease of the spine and bilateral ribs, grossly similar compared to prior study. Review of the MIP images confirms the above findings. IMPRESSION: 1. No CT evidence for acute pulmonary embolus or aortic dissection. 2. Progression of diffuse bilateral septal thickening, right greater and than left. Concern previously raised for lymphangitic spread of tumor. Stable 5 mm spiculated nodule right upper lobe. Interim finding of partial atelectasis or consolidation in the right middle lobe and left lower lobe. 3. Multiple poorly visualized hepatic masses with fatty changes of the liver. Suspect that the masses have  increased in size . 4. Extensive skeletal metastatic disease, grossly unchanged. Electronically Signed   By: Kim  Fujinaga M.D.   On:   05/04/2016 14:39    ELIGIBLE FOR AVAILABLE RESEARCH PROTOCOL: no  ASSESSMENT: 50 y.o. BRCA negative [place] woman originally from [place]  (1) status post left breast upper inner quadrant biopsy 05/17/2007 for a clinical mT2 N2, stage IIIA invasive ductal breast cancer, strongly estrogen receptor positive, weakly progesterone receptor positive, HER-2 negative, with an MIB-1 of 52%  (2) neoadjuvant chemotherapy consisted of dose dense doxorubicin and cyclophosphamide 4 followed by weekly paclitaxel 12, started 06/10/2007, completed 09/28/2007  (3) status post left modified radical mastectomy 10/27/2007 at [facility] [phone]) removed that invasive ductal carcinoma, grade 2, pT2, pN1a (downstaged to IIB), estrogen receptor strongly positive, progesterone receptor weakly positive, HER-2 not amplified by immunohistochemistry or FISH with a signals ratio of 0.94, number per cell 3.4  (4) status post adjuvant radiation to the left chest wall as well as the left supraclavicular and axillary nodal regions (50.4 gray +10 Gray boost) given between 12/15/2007 on 01/31/2008  (5) on adjuvant tamoxifen 02/20/2008 through March 2016  METASTATIC DISEASE: March 2016, presenting with bone pain (6) staging studies April 2016 showed diffuse bony metastatic disease, intrathoracic adenopathy, and small lung lesions.  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  (10) repeat genetic testing 04/23/2016 through the [facility]reditary Cancer Panel offered by [facility] found no deleterious mutations in APC, ATM, BARD1, BMPR1A, BRCA1, BRCA2, BRIP1, CHD1, CDK4, CDKN2A, CHEK2, EPCAM (large rearrangement only), GREM1/SCG5, MLH1, MSH2, MSH6, MUTYH, NBN, PALB2, PMS2, POLD1, POLE, PTEN, RAD51C, RAD51D, SMAD4, STK11, and TP53  (11) gemcitabine started 04/24/2016, to be given days 1 and 8 of each 21 day cycle  (a) day 8 cycle 1 delayed because of cytopenias--dose reduced and neupogen added   PLAN: We are proceeding to cycle 2 of gemcitabine today. She is generally tolerating this well. She will receive [place] days 4 and 5 after each dose (we are omitting this Saturday shop since I don't believe she will need it). Once we complete 3 cycles, 6 doses of Gemzar, she will be restaged.  Her pain is well-controlled on her current medications and she has a good bowel prophylaxis in place  She will need a repeat brain MRI at some point in January.  She is going to send me a soup recipe before the next visit here which will be 06/19/2016  I have encouraged her to continue to exercise as much as she can. She will call if any headaches, dizziness, nausea or vomiting or visual changes develop       [name], MD   05/29/2016 9:47 AM Medical Oncology and Hematology [facility] [address] [place], [place] [address] Tel. [phone]    Fax. [phone]

## 2016-05-29 NOTE — Patient Instructions (Signed)
[facility] Discharge Instructions for Patients Receiving Chemotherapy  Today you received the following chemotherapy agents: Gemzar.  To help prevent nausea and vomiting after your treatment, we encourage you to take your nausea medication as prescribed.   If you develop nausea and vomiting that is not controlled by your nausea medication, call the clinic.   BELOW ARE SYMPTOMS THAT SHOULD BE REPORTED IMMEDIATELY:  *FEVER GREATER THAN 100.5 F  *CHILLS WITH OR WITHOUT FEVER  NAUSEA AND VOMITING THAT IS NOT CONTROLLED WITH YOUR NAUSEA MEDICATION  *UNUSUAL SHORTNESS OF BREATH  *UNUSUAL BRUISING OR BLEEDING  TENDERNESS IN MOUTH AND THROAT WITH OR WITHOUT PRESENCE OF ULCERS  *URINARY PROBLEMS  *BOWEL PROBLEMS  UNUSUAL RASH Items with * indicate a potential emergency and should be followed up as soon as possible.  Feel free to call the clinic you have any questions or concerns. The clinic phone number is [phone].  Please show the [facility] at check-in to the Emergency Department and triage nurse.  Denosumab injection What is this medicine? DENOSUMAB (den oh sue mab) slows bone breakdown. Prolia is used to treat osteoporosis in women after menopause and in men. Delton See is used to prevent bone fractures and other bone problems caused by cancer bone metastases. Delton See is also used to treat giant cell tumor of the bone. COMMON BRAND NAME(S): Prolia, XGEVA What should I tell my health care provider before I take this medicine? They need to know if you have any of these conditions: -dental disease -eczema -infection or history of infections -kidney disease or on dialysis -low blood calcium or vitamin D -malabsorption syndrome -scheduled to have surgery or tooth extraction -taking medicine that contains denosumab -thyroid or parathyroid disease -an unusual reaction to denosumab, other medicines, foods, dyes, or preservatives -pregnant or trying to get  pregnant -breast-feeding How should I use this medicine? This medicine is for injection under the skin. It is given by a health care professional in a hospital or clinic setting. If you are getting Prolia, a special MedGuide will be given to you by the pharmacist with each prescription and refill. Be sure to read this information carefully each time. For Prolia, talk to your pediatrician regarding the use of this medicine in children. Special care may be needed. For Delton See, talk to your pediatrician regarding the use of this medicine in children. While this drug may be prescribed for children as young as 13 years for selected conditions, precautions do apply. What if I miss a dose? It is important not to miss your dose. Call your doctor or health care professional if you are unable to keep an appointment. What may interact with this medicine? Do not take this medicine with any of the following medications: -other medicines containing denosumab This medicine may also interact with the following medications: -medicines that suppress the immune system -medicines that treat cancer -steroid medicines like prednisone or cortisone What should I watch for while using this medicine? Visit your doctor or health care professional for regular checks on your progress. Your doctor or health care professional may order blood tests and other tests to see how you are doing. Call your doctor or health care professional if you get a cold or other infection while receiving this medicine. Do not treat yourself. This medicine may decrease your body's ability to fight infection. You should make sure you get enough calcium and vitamin D while you are taking this medicine, unless your doctor tells you not to. Discuss  the foods you eat and the vitamins you take with your health care professional. See your dentist regularly. Brush and floss your teeth as directed. Before you have any dental work done, tell your dentist you  are receiving this medicine. Do not become pregnant while taking this medicine or for 5 months after stopping it. Women should inform their doctor if they wish to become pregnant or think they might be pregnant. There is a potential for serious side effects to an unborn child. Talk to your health care professional or pharmacist for more information. What side effects may I notice from receiving this medicine? Side effects that you should report to your doctor or health care professional as soon as possible: -allergic reactions like skin rash, itching or hives, swelling of the face, lips, or tongue -breathing problems -chest pain -fast, irregular heartbeat -feeling faint or lightheaded, falls -fever, chills, or any other sign of infection -muscle spasms, tightening, or twitches -numbness or tingling -skin blisters or bumps, or is dry, peels, or red -slow healing or unexplained pain in the mouth or jaw -unusual bleeding or bruising Side effects that usually do not require medical attention (report to your doctor or health care professional if they continue or are bothersome): -muscle pain -stomach upset, gas Where should I keep my medicine? This medicine is only given in a clinic, doctor's office, or other health care setting and will not be stored at home.  2017 Elsevier/Gold Standard (2015-07-18 10:06:55)   Gemcitabine injection What is this medicine? GEMCITABINE (jem SIT a been) is a chemotherapy drug. This medicine is used to treat many types of cancer like breast cancer, lung cancer, pancreatic cancer, and ovarian cancer. This medicine may be used for other purposes; ask your health care provider or pharmacist if you have questions. COMMON BRAND NAME(S): Gemzar What should I tell my health care provider before I take this medicine? They need to know if you have any of these conditions: -blood disorders -infection -kidney disease -liver disease -recent or ongoing radiation  therapy -an unusual or allergic reaction to gemcitabine, other chemotherapy, other medicines, foods, dyes, or preservatives -pregnant or trying to get pregnant -breast-feeding How should I use this medicine? This drug is given as an infusion into a vein. It is administered in a hospital or clinic by a specially trained health care professional. Talk to your pediatrician regarding the use of this medicine in children. Special care may be needed. Overdosage: If you think you have taken too much of this medicine contact a poison control center or emergency room at once. NOTE: This medicine is only for you. Do not share this medicine with others. What if I miss a dose? It is important not to miss your dose. Call your doctor or health care professional if you are unable to keep an appointment. What may interact with this medicine? -medicines to increase blood counts like filgrastim, pegfilgrastim, sargramostim -some other chemotherapy drugs like cisplatin -vaccines Talk to your doctor or health care professional before taking any of these medicines: -acetaminophen -aspirin -ibuprofen -ketoprofen -naproxen This list may not describe all possible interactions. Give your health care provider a list of all the medicines, herbs, non-prescription drugs, or dietary supplements you use. Also tell them if you smoke, drink alcohol, or use illegal drugs. Some items may interact with your medicine. What should I watch for while using this medicine? Visit your doctor for checks on your progress. This drug may make you feel generally unwell. This is not uncommon,  as chemotherapy can affect healthy cells as well as cancer cells. Report any side effects. Continue your course of treatment even though you feel ill unless your doctor tells you to stop. In some cases, you may be given additional medicines to help with side effects. Follow all directions for their use. Call your doctor or health care professional for  advice if you get a fever, chills or sore throat, or other symptoms of a cold or flu. Do not treat yourself. This drug decreases your body's ability to fight infections. Try to avoid being around people who are sick. This medicine may increase your risk to bruise or bleed. Call your doctor or health care professional if you notice any unusual bleeding. Be careful brushing and flossing your teeth or using a toothpick because you may get an infection or bleed more easily. If you have any dental work done, tell your dentist you are receiving this medicine. Avoid taking products that contain aspirin, acetaminophen, ibuprofen, naproxen, or ketoprofen unless instructed by your doctor. These medicines may hide a fever. Women should inform their doctor if they wish to become pregnant or think they might be pregnant. There is a potential for serious side effects to an unborn child. Talk to your health care professional or pharmacist for more information. Do not breast-feed an infant while taking this medicine. What side effects may I notice from receiving this medicine? Side effects that you should report to your doctor or health care professional as soon as possible: -allergic reactions like skin rash, itching or hives, swelling of the face, lips, or tongue -low blood counts - this medicine may decrease the number of white blood cells, red blood cells and platelets. You may be at increased risk for infections and bleeding. -signs of infection - fever or chills, cough, sore throat, pain or difficulty passing urine -signs of decreased platelets or bleeding - bruising, pinpoint red spots on the skin, black, tarry stools, blood in the urine -signs of decreased red blood cells - unusually weak or tired, fainting spells, lightheadedness -breathing problems -chest pain -mouth sores -nausea and vomiting -pain, swelling, redness at site where injected -pain, tingling, numbness in the hands or feet -stomach  pain -swelling of ankles, feet, hands -unusual bleeding Side effects that usually do not require medical attention (report to your doctor or health care professional if they continue or are bothersome): -constipation -diarrhea -hair loss -loss of appetite -stomach upset This list may not describe all possible side effects. Call your doctor for medical advice about side effects. You may report side effects to FDA at 1-800-FDA-1088. Where should I keep my medicine? This drug is given in a hospital or clinic and will not be stored at home. NOTE: This sheet is a summary. It may not cover all possible information. If you have questions about this medicine, talk to your doctor, pharmacist, or health care provider.  2017 Elsevier/Gold Standard (2007-10-25 18:45:54)

## 2016-06-01 ENCOUNTER — Ambulatory Visit (HOSPITAL_BASED_OUTPATIENT_CLINIC_OR_DEPARTMENT_OTHER): Payer: BLUE CROSS/BLUE SHIELD

## 2016-06-01 VITALS — BP 116/76 | HR 100 | Temp 98.2°F | Resp 20

## 2016-06-01 DIAGNOSIS — C7951 Secondary malignant neoplasm of bone: Secondary | ICD-10-CM | POA: Diagnosis not present

## 2016-06-01 DIAGNOSIS — C7931 Secondary malignant neoplasm of brain: Secondary | ICD-10-CM

## 2016-06-01 DIAGNOSIS — C50912 Malignant neoplasm of unspecified site of left female breast: Secondary | ICD-10-CM

## 2016-06-01 DIAGNOSIS — C50212 Malignant neoplasm of upper-inner quadrant of left female breast: Secondary | ICD-10-CM | POA: Diagnosis not present

## 2016-06-01 DIAGNOSIS — C7800 Secondary malignant neoplasm of unspecified lung: Secondary | ICD-10-CM

## 2016-06-01 DIAGNOSIS — Z5189 Encounter for other specified aftercare: Secondary | ICD-10-CM

## 2016-06-01 DIAGNOSIS — C787 Secondary malignant neoplasm of liver and intrahepatic bile duct: Secondary | ICD-10-CM

## 2016-06-01 MED ORDER — TBO-FILGRASTIM 300 MCG/0.5ML ~~LOC~~ SOSY
300.0000 ug | PREFILLED_SYRINGE | Freq: Once | SUBCUTANEOUS | Status: AC
  Administered 2016-06-01: 300 ug via SUBCUTANEOUS
  Filled 2016-06-01: qty 0.5

## 2016-06-01 NOTE — Patient Instructions (Signed)
Tbo-Filgrastim injection What is this medicine? TBO-FILGRASTIM (T B O fil GRA stim) is a granulocyte colony-stimulating factor that stimulates the growth of neutrophils, a type of white blood cell important in the body's fight against infection. It is used to reduce the incidence of fever and infection in patients with certain types of cancer who are receiving chemotherapy that affects the bone marrow. COMMON BRAND NAME(S): Granix What should I tell my health care provider before I take this medicine? They need to know if you have any of these conditions: -ongoing radiation therapy -sickle cell anemia -an unusual or allergic reaction to tbo-filgrastim, filgrastim, pegfilgrastim, other medicines, foods, dyes, or preservatives -pregnant or trying to get pregnant -breast-feeding How should I use this medicine? This medicine is for injection under the skin. If you get this medicine at home, you will be taught how to prepare and give this medicine. Refer to the Instructions for Use that come with your medication packaging. Use exactly as directed. Take your medicine at regular intervals. Do not take your medicine more often than directed. It is important that you put your used needles and syringes in a special sharps container. Do not put them in a trash can. If you do not have a sharps container, call your pharmacist or healthcare provider to get one. Talk to your pediatrician regarding the use of this medicine in children. Special care may be needed. What if I miss a dose? It is important not to miss your dose. Call your doctor or health care professional if you miss a dose. What may interact with this medicine? This medicine may interact with the following medications: -medicines that may cause a release of neutrophils, such as lithium What should I watch for while using this medicine? You may need blood work done while you are taking this medicine. What side effects may I notice from receiving  this medicine? Side effects that you should report to your doctor or health care professional as soon as possible: -allergic reactions like skin rash, itching or hives, swelling of the face, lips, or tongue -shortness of breath or breathing problems -fever -pain, redness, or irritation at site where injected -pinpoint red spots on the skin -stomach or side pain, or pain at the shoulder -swelling -tiredness -trouble passing urine Side effects that usually do not require medical attention (report to your doctor or health care professional if they continue or are bothersome): -bone pain -muscle pain Where should I keep my medicine? Keep out of the reach of children. Store in a refrigerator between 2 and 8 degrees C (36 and 46 degrees F). Keep in carton to protect from light. Throw away this medicine if it is left out of the refrigerator for more than 5 consecutive days. Throw away any unused medicine after the expiration date.  2017 Elsevier/Gold Standard (2015-07-18 12:15:25)  

## 2016-06-02 ENCOUNTER — Ambulatory Visit (HOSPITAL_BASED_OUTPATIENT_CLINIC_OR_DEPARTMENT_OTHER): Payer: BLUE CROSS/BLUE SHIELD

## 2016-06-02 VITALS — BP 107/76 | HR 100 | Temp 97.7°F | Resp 18

## 2016-06-02 DIAGNOSIS — C7951 Secondary malignant neoplasm of bone: Secondary | ICD-10-CM

## 2016-06-02 DIAGNOSIS — C7800 Secondary malignant neoplasm of unspecified lung: Secondary | ICD-10-CM

## 2016-06-02 DIAGNOSIS — C50212 Malignant neoplasm of upper-inner quadrant of left female breast: Secondary | ICD-10-CM | POA: Diagnosis not present

## 2016-06-02 DIAGNOSIS — Z5189 Encounter for other specified aftercare: Secondary | ICD-10-CM

## 2016-06-02 DIAGNOSIS — C7931 Secondary malignant neoplasm of brain: Secondary | ICD-10-CM | POA: Diagnosis not present

## 2016-06-02 DIAGNOSIS — C787 Secondary malignant neoplasm of liver and intrahepatic bile duct: Secondary | ICD-10-CM | POA: Diagnosis not present

## 2016-06-02 DIAGNOSIS — C50912 Malignant neoplasm of unspecified site of left female breast: Secondary | ICD-10-CM

## 2016-06-02 MED ORDER — FILGRASTIM 300 MCG/0.5ML IJ SOSY: 300.0000 ug | PREFILLED_SYRINGE | Freq: Once | INTRAMUSCULAR | Status: DC

## 2016-06-02 MED ORDER — TBO-FILGRASTIM 300 MCG/0.5ML ~~LOC~~ SOSY
300.0000 ug | PREFILLED_SYRINGE | Freq: Once | SUBCUTANEOUS | Status: AC
  Administered 2016-06-02: 300 ug via SUBCUTANEOUS
  Filled 2016-06-02: qty 0.5

## 2016-06-02 NOTE — Patient Instructions (Signed)
Tbo-Filgrastim injection What is this medicine? TBO-FILGRASTIM (T B O fil GRA stim) is a granulocyte colony-stimulating factor that stimulates the growth of neutrophils, a type of white blood cell important in the body's fight against infection. It is used to reduce the incidence of fever and infection in patients with certain types of cancer who are receiving chemotherapy that affects the bone marrow. COMMON BRAND NAME(S): Granix What should I tell my health care provider before I take this medicine? They need to know if you have any of these conditions: -ongoing radiation therapy -sickle cell anemia -an unusual or allergic reaction to tbo-filgrastim, filgrastim, pegfilgrastim, other medicines, foods, dyes, or preservatives -pregnant or trying to get pregnant -breast-feeding How should I use this medicine? This medicine is for injection under the skin. If you get this medicine at home, you will be taught how to prepare and give this medicine. Refer to the Instructions for Use that come with your medication packaging. Use exactly as directed. Take your medicine at regular intervals. Do not take your medicine more often than directed. It is important that you put your used needles and syringes in a special sharps container. Do not put them in a trash can. If you do not have a sharps container, call your pharmacist or healthcare provider to get one. Talk to your pediatrician regarding the use of this medicine in children. Special care may be needed. What if I miss a dose? It is important not to miss your dose. Call your doctor or health care professional if you miss a dose. What may interact with this medicine? This medicine may interact with the following medications: -medicines that may cause a release of neutrophils, such as lithium What should I watch for while using this medicine? You may need blood work done while you are taking this medicine. What side effects may I notice from receiving  this medicine? Side effects that you should report to your doctor or health care professional as soon as possible: -allergic reactions like skin rash, itching or hives, swelling of the face, lips, or tongue -shortness of breath or breathing problems -fever -pain, redness, or irritation at site where injected -pinpoint red spots on the skin -stomach or side pain, or pain at the shoulder -swelling -tiredness -trouble passing urine Side effects that usually do not require medical attention (report to your doctor or health care professional if they continue or are bothersome): -bone pain -muscle pain Where should I keep my medicine? Keep out of the reach of children. Store in a refrigerator between 2 and 8 degrees C (36 and 46 degrees F). Keep in carton to protect from light. Throw away this medicine if it is left out of the refrigerator for more than 5 consecutive days. Throw away any unused medicine after the expiration date.  2017 Elsevier/Gold Standard (2015-07-18 12:15:25)  

## 2016-06-04 ENCOUNTER — Other Ambulatory Visit: Payer: Self-pay | Admitting: *Deleted

## 2016-06-04 DIAGNOSIS — Z170 Estrogen receptor positive status [ER+]: Principal | ICD-10-CM

## 2016-06-04 DIAGNOSIS — C50912 Malignant neoplasm of unspecified site of left female breast: Secondary | ICD-10-CM

## 2016-06-05 ENCOUNTER — Other Ambulatory Visit (HOSPITAL_BASED_OUTPATIENT_CLINIC_OR_DEPARTMENT_OTHER): Payer: BLUE CROSS/BLUE SHIELD

## 2016-06-05 ENCOUNTER — Ambulatory Visit (HOSPITAL_BASED_OUTPATIENT_CLINIC_OR_DEPARTMENT_OTHER): Payer: BLUE CROSS/BLUE SHIELD

## 2016-06-05 ENCOUNTER — Ambulatory Visit: Payer: BLUE CROSS/BLUE SHIELD

## 2016-06-05 VITALS — BP 106/78 | HR 97 | Temp 98.0°F | Resp 18

## 2016-06-05 DIAGNOSIS — C50912 Malignant neoplasm of unspecified site of left female breast: Secondary | ICD-10-CM

## 2016-06-05 DIAGNOSIS — Z5111 Encounter for antineoplastic chemotherapy: Secondary | ICD-10-CM

## 2016-06-05 DIAGNOSIS — C7931 Secondary malignant neoplasm of brain: Secondary | ICD-10-CM | POA: Diagnosis not present

## 2016-06-05 DIAGNOSIS — C50212 Malignant neoplasm of upper-inner quadrant of left female breast: Secondary | ICD-10-CM | POA: Diagnosis not present

## 2016-06-05 DIAGNOSIS — C787 Secondary malignant neoplasm of liver and intrahepatic bile duct: Secondary | ICD-10-CM

## 2016-06-05 DIAGNOSIS — C7951 Secondary malignant neoplasm of bone: Secondary | ICD-10-CM | POA: Diagnosis not present

## 2016-06-05 DIAGNOSIS — C7800 Secondary malignant neoplasm of unspecified lung: Secondary | ICD-10-CM

## 2016-06-05 DIAGNOSIS — Z170 Estrogen receptor positive status [ER+]: Principal | ICD-10-CM

## 2016-06-05 LAB — COMPREHENSIVE METABOLIC PANEL
ALBUMIN: 2.7 g/dL — AB (ref 3.5–5.0)
ALK PHOS: 202 U/L — AB (ref 40–150)
ALT: 48 U/L (ref 0–55)
AST: 43 U/L — AB (ref 5–34)
Anion Gap: 8 mEq/L (ref 3–11)
BILIRUBIN TOTAL: 0.38 mg/dL (ref 0.20–1.20)
BUN: 5.6 mg/dL — AB (ref 7.0–26.0)
CO2: 25 meq/L (ref 22–29)
Calcium: 8.1 mg/dL — ABNORMAL LOW (ref 8.4–10.4)
Chloride: 105 mEq/L (ref 98–109)
Creatinine: 0.6 mg/dL (ref 0.6–1.1)
GLUCOSE: 120 mg/dL (ref 70–140)
Potassium: 3.6 mEq/L (ref 3.5–5.1)
SODIUM: 138 meq/L (ref 136–145)
TOTAL PROTEIN: 6 g/dL — AB (ref 6.4–8.3)

## 2016-06-05 LAB — CBC WITH DIFFERENTIAL/PLATELET
BASO%: 0.6 % (ref 0.0–2.0)
BASOS ABS: 0 10*3/uL (ref 0.0–0.1)
EOS%: 0.2 % (ref 0.0–7.0)
Eosinophils Absolute: 0 10*3/uL (ref 0.0–0.5)
HEMATOCRIT: 30.8 % — AB (ref 34.8–46.6)
HEMOGLOBIN: 9.8 g/dL — AB (ref 11.6–15.9)
LYMPH#: 0.9 10*3/uL (ref 0.9–3.3)
LYMPH%: 13.2 % — AB (ref 14.0–49.7)
MCH: 33.6 pg (ref 25.1–34.0)
MCHC: 31.8 g/dL (ref 31.5–36.0)
MCV: 105.5 fL — AB (ref 79.5–101.0)
MONO#: 0.9 10*3/uL (ref 0.1–0.9)
MONO%: 13.8 % (ref 0.0–14.0)
NEUT#: 4.8 10*3/uL (ref 1.5–6.5)
NEUT%: 72.2 % (ref 38.4–76.8)
PLATELETS: 140 10*3/uL — AB (ref 145–400)
RBC: 2.92 10*6/uL — ABNORMAL LOW (ref 3.70–5.45)
RDW: 15.4 % — ABNORMAL HIGH (ref 11.2–14.5)
WBC: 6.6 10*3/uL (ref 3.9–10.3)
nRBC: 1 % — ABNORMAL HIGH (ref 0–0)

## 2016-06-05 MED ORDER — PROCHLORPERAZINE MALEATE 10 MG PO TABS
10.0000 mg | ORAL_TABLET | Freq: Once | ORAL | Status: AC
  Administered 2016-06-05: 10 mg via ORAL

## 2016-06-05 MED ORDER — ACETAMINOPHEN 325 MG PO TABS
ORAL_TABLET | ORAL | Status: AC
  Filled 2016-06-05: qty 2

## 2016-06-05 MED ORDER — SODIUM CHLORIDE 0.9 % IV SOLN
Freq: Once | INTRAVENOUS | Status: AC
  Administered 2016-06-05: 12:00:00 via INTRAVENOUS

## 2016-06-05 MED ORDER — SODIUM CHLORIDE 0.9% FLUSH
10.0000 mL | INTRAVENOUS | Status: DC | PRN
  Administered 2016-06-05: 10 mL via INTRAVENOUS
  Filled 2016-06-05: qty 10

## 2016-06-05 MED ORDER — GEMCITABINE HCL CHEMO INJECTION 1 GM/26.3ML
600.0000 mg/m2 | Freq: Once | INTRAVENOUS | Status: AC
  Administered 2016-06-05: 1064 mg via INTRAVENOUS
  Filled 2016-06-05: qty 27.98

## 2016-06-05 MED ORDER — PROCHLORPERAZINE MALEATE 10 MG PO TABS
ORAL_TABLET | ORAL | Status: AC
  Filled 2016-06-05: qty 1

## 2016-06-05 MED ORDER — HEPARIN SOD (PORK) LOCK FLUSH 100 UNIT/ML IV SOLN
500.0000 [IU] | Freq: Once | INTRAVENOUS | Status: AC | PRN
  Administered 2016-06-05: 500 [IU]
  Filled 2016-06-05: qty 5

## 2016-06-05 MED ORDER — SODIUM CHLORIDE 0.9% FLUSH
10.0000 mL | INTRAVENOUS | Status: DC | PRN
  Administered 2016-06-05: 10 mL
  Filled 2016-06-05: qty 10

## 2016-06-05 MED ORDER — ACETAMINOPHEN 325 MG PO TABS
325.0000 mg | ORAL_TABLET | Freq: Once | ORAL | Status: AC
  Administered 2016-06-05: 325 mg via ORAL

## 2016-06-05 NOTE — Patient Instructions (Signed)
[facility] Discharge Instructions for Patients Receiving Chemotherapy  Today you received the following chemotherapy agents: Gemzar.  To help prevent nausea and vomiting after your treatment, we encourage you to take your nausea medication as prescribed.   If you develop nausea and vomiting that is not controlled by your nausea medication, call the clinic.   BELOW ARE SYMPTOMS THAT SHOULD BE REPORTED IMMEDIATELY:  *FEVER GREATER THAN 100.5 F  *CHILLS WITH OR WITHOUT FEVER  NAUSEA AND VOMITING THAT IS NOT CONTROLLED WITH YOUR NAUSEA MEDICATION  *UNUSUAL SHORTNESS OF BREATH  *UNUSUAL BRUISING OR BLEEDING  TENDERNESS IN MOUTH AND THROAT WITH OR WITHOUT PRESENCE OF ULCERS  *URINARY PROBLEMS  *BOWEL PROBLEMS  UNUSUAL RASH Items with * indicate a potential emergency and should be followed up as soon as possible.  Feel free to call the clinic you have any questions or concerns. The clinic phone number is [phone].  Please show the [facility] at check-in to the Emergency Department and triage nurse.  Denosumab injection What is this medicine? DENOSUMAB (den oh sue mab) slows bone breakdown. Prolia is used to treat osteoporosis in women after menopause and in men. Delton See is used to prevent bone fractures and other bone problems caused by cancer bone metastases. Delton See is also used to treat giant cell tumor of the bone. COMMON BRAND NAME(S): Prolia, XGEVA What should I tell my health care provider before I take this medicine? They need to know if you have any of these conditions: -dental disease -eczema -infection or history of infections -kidney disease or on dialysis -low blood calcium or vitamin D -malabsorption syndrome -scheduled to have surgery or tooth extraction -taking medicine that contains denosumab -thyroid or parathyroid disease -an unusual reaction to denosumab, other medicines, foods, dyes, or preservatives -pregnant or trying to get  pregnant -breast-feeding How should I use this medicine? This medicine is for injection under the skin. It is given by a health care professional in a hospital or clinic setting. If you are getting Prolia, a special MedGuide will be given to you by the pharmacist with each prescription and refill. Be sure to read this information carefully each time. For Prolia, talk to your pediatrician regarding the use of this medicine in children. Special care may be needed. For Delton See, talk to your pediatrician regarding the use of this medicine in children. While this drug may be prescribed for children as young as 13 years for selected conditions, precautions do apply. What if I miss a dose? It is important not to miss your dose. Call your doctor or health care professional if you are unable to keep an appointment. What may interact with this medicine? Do not take this medicine with any of the following medications: -other medicines containing denosumab This medicine may also interact with the following medications: -medicines that suppress the immune system -medicines that treat cancer -steroid medicines like prednisone or cortisone What should I watch for while using this medicine? Visit your doctor or health care professional for regular checks on your progress. Your doctor or health care professional may order blood tests and other tests to see how you are doing. Call your doctor or health care professional if you get a cold or other infection while receiving this medicine. Do not treat yourself. This medicine may decrease your body's ability to fight infection. You should make sure you get enough calcium and vitamin D while you are taking this medicine, unless your doctor tells you not to. Discuss  the foods you eat and the vitamins you take with your health care professional. See your dentist regularly. Brush and floss your teeth as directed. Before you have any dental work done, tell your dentist you  are receiving this medicine. Do not become pregnant while taking this medicine or for 5 months after stopping it. Women should inform their doctor if they wish to become pregnant or think they might be pregnant. There is a potential for serious side effects to an unborn child. Talk to your health care professional or pharmacist for more information. What side effects may I notice from receiving this medicine? Side effects that you should report to your doctor or health care professional as soon as possible: -allergic reactions like skin rash, itching or hives, swelling of the face, lips, or tongue -breathing problems -chest pain -fast, irregular heartbeat -feeling faint or lightheaded, falls -fever, chills, or any other sign of infection -muscle spasms, tightening, or twitches -numbness or tingling -skin blisters or bumps, or is dry, peels, or red -slow healing or unexplained pain in the mouth or jaw -unusual bleeding or bruising Side effects that usually do not require medical attention (report to your doctor or health care professional if they continue or are bothersome): -muscle pain -stomach upset, gas Where should I keep my medicine? This medicine is only given in a clinic, doctor's office, or other health care setting and will not be stored at home.  2017 Elsevier/Gold Standard (2015-07-18 10:06:55)   Gemcitabine injection What is this medicine? GEMCITABINE (jem SIT a been) is a chemotherapy drug. This medicine is used to treat many types of cancer like breast cancer, lung cancer, pancreatic cancer, and ovarian cancer. This medicine may be used for other purposes; ask your health care provider or pharmacist if you have questions. COMMON BRAND NAME(S): Gemzar What should I tell my health care provider before I take this medicine? They need to know if you have any of these conditions: -blood disorders -infection -kidney disease -liver disease -recent or ongoing radiation  therapy -an unusual or allergic reaction to gemcitabine, other chemotherapy, other medicines, foods, dyes, or preservatives -pregnant or trying to get pregnant -breast-feeding How should I use this medicine? This drug is given as an infusion into a vein. It is administered in a hospital or clinic by a specially trained health care professional. Talk to your pediatrician regarding the use of this medicine in children. Special care may be needed. Overdosage: If you think you have taken too much of this medicine contact a poison control center or emergency room at once. NOTE: This medicine is only for you. Do not share this medicine with others. What if I miss a dose? It is important not to miss your dose. Call your doctor or health care professional if you are unable to keep an appointment. What may interact with this medicine? -medicines to increase blood counts like filgrastim, pegfilgrastim, sargramostim -some other chemotherapy drugs like cisplatin -vaccines Talk to your doctor or health care professional before taking any of these medicines: -acetaminophen -aspirin -ibuprofen -ketoprofen -naproxen This list may not describe all possible interactions. Give your health care provider a list of all the medicines, herbs, non-prescription drugs, or dietary supplements you use. Also tell them if you smoke, drink alcohol, or use illegal drugs. Some items may interact with your medicine. What should I watch for while using this medicine? Visit your doctor for checks on your progress. This drug may make you feel generally unwell. This is not uncommon,  as chemotherapy can affect healthy cells as well as cancer cells. Report any side effects. Continue your course of treatment even though you feel ill unless your doctor tells you to stop. In some cases, you may be given additional medicines to help with side effects. Follow all directions for their use. Call your doctor or health care professional for  advice if you get a fever, chills or sore throat, or other symptoms of a cold or flu. Do not treat yourself. This drug decreases your body's ability to fight infections. Try to avoid being around people who are sick. This medicine may increase your risk to bruise or bleed. Call your doctor or health care professional if you notice any unusual bleeding. Be careful brushing and flossing your teeth or using a toothpick because you may get an infection or bleed more easily. If you have any dental work done, tell your dentist you are receiving this medicine. Avoid taking products that contain aspirin, acetaminophen, ibuprofen, naproxen, or ketoprofen unless instructed by your doctor. These medicines may hide a fever. Women should inform their doctor if they wish to become pregnant or think they might be pregnant. There is a potential for serious side effects to an unborn child. Talk to your health care professional or pharmacist for more information. Do not breast-feed an infant while taking this medicine. What side effects may I notice from receiving this medicine? Side effects that you should report to your doctor or health care professional as soon as possible: -allergic reactions like skin rash, itching or hives, swelling of the face, lips, or tongue -low blood counts - this medicine may decrease the number of white blood cells, red blood cells and platelets. You may be at increased risk for infections and bleeding. -signs of infection - fever or chills, cough, sore throat, pain or difficulty passing urine -signs of decreased platelets or bleeding - bruising, pinpoint red spots on the skin, black, tarry stools, blood in the urine -signs of decreased red blood cells - unusually weak or tired, fainting spells, lightheadedness -breathing problems -chest pain -mouth sores -nausea and vomiting -pain, swelling, redness at site where injected -pain, tingling, numbness in the hands or feet -stomach  pain -swelling of ankles, feet, hands -unusual bleeding Side effects that usually do not require medical attention (report to your doctor or health care professional if they continue or are bothersome): -constipation -diarrhea -hair loss -loss of appetite -stomach upset This list may not describe all possible side effects. Call your doctor for medical advice about side effects. You may report side effects to FDA at 1-800-FDA-1088. Where should I keep my medicine? This drug is given in a hospital or clinic and will not be stored at home. NOTE: This sheet is a summary. It may not cover all possible information. If you have questions about this medicine, talk to your doctor, pharmacist, or health care provider.  2017 Elsevier/Gold Standard (2007-10-25 18:45:54)

## 2016-06-08 ENCOUNTER — Ambulatory Visit (HOSPITAL_BASED_OUTPATIENT_CLINIC_OR_DEPARTMENT_OTHER): Payer: BLUE CROSS/BLUE SHIELD

## 2016-06-08 VITALS — BP 111/66 | HR 101 | Temp 97.5°F | Resp 20

## 2016-06-08 DIAGNOSIS — C7931 Secondary malignant neoplasm of brain: Secondary | ICD-10-CM | POA: Diagnosis not present

## 2016-06-08 DIAGNOSIS — C7951 Secondary malignant neoplasm of bone: Secondary | ICD-10-CM

## 2016-06-08 DIAGNOSIS — C50212 Malignant neoplasm of upper-inner quadrant of left female breast: Secondary | ICD-10-CM

## 2016-06-08 DIAGNOSIS — C787 Secondary malignant neoplasm of liver and intrahepatic bile duct: Secondary | ICD-10-CM

## 2016-06-08 DIAGNOSIS — C50912 Malignant neoplasm of unspecified site of left female breast: Secondary | ICD-10-CM

## 2016-06-08 DIAGNOSIS — C7800 Secondary malignant neoplasm of unspecified lung: Secondary | ICD-10-CM

## 2016-06-08 DIAGNOSIS — Z5189 Encounter for other specified aftercare: Secondary | ICD-10-CM

## 2016-06-08 MED ORDER — TBO-FILGRASTIM 300 MCG/0.5ML ~~LOC~~ SOSY
300.0000 ug | PREFILLED_SYRINGE | Freq: Once | SUBCUTANEOUS | Status: AC
  Administered 2016-06-08: 300 ug via SUBCUTANEOUS
  Filled 2016-06-08: qty 0.5

## 2016-06-08 MED ORDER — FILGRASTIM 300 MCG/0.5ML IJ SOSY: 300.0000 ug | PREFILLED_SYRINGE | Freq: Once | INTRAMUSCULAR | Status: DC

## 2016-06-08 NOTE — Patient Instructions (Signed)
Tbo-Filgrastim injection What is this medicine? TBO-FILGRASTIM (T B O fil GRA stim) is a granulocyte colony-stimulating factor that stimulates the growth of neutrophils, a type of white blood cell important in the body's fight against infection. It is used to reduce the incidence of fever and infection in patients with certain types of cancer who are receiving chemotherapy that affects the bone marrow. COMMON BRAND NAME(S): Granix What should I tell my health care provider before I take this medicine? They need to know if you have any of these conditions: -ongoing radiation therapy -sickle cell anemia -an unusual or allergic reaction to tbo-filgrastim, filgrastim, pegfilgrastim, other medicines, foods, dyes, or preservatives -pregnant or trying to get pregnant -breast-feeding How should I use this medicine? This medicine is for injection under the skin. If you get this medicine at home, you will be taught how to prepare and give this medicine. Refer to the Instructions for Use that come with your medication packaging. Use exactly as directed. Take your medicine at regular intervals. Do not take your medicine more often than directed. It is important that you put your used needles and syringes in a special sharps container. Do not put them in a trash can. If you do not have a sharps container, call your pharmacist or healthcare provider to get one. Talk to your pediatrician regarding the use of this medicine in children. Special care may be needed. What if I miss a dose? It is important not to miss your dose. Call your doctor or health care professional if you miss a dose. What may interact with this medicine? This medicine may interact with the following medications: -medicines that may cause a release of neutrophils, such as lithium What should I watch for while using this medicine? You may need blood work done while you are taking this medicine. What side effects may I notice from receiving  this medicine? Side effects that you should report to your doctor or health care professional as soon as possible: -allergic reactions like skin rash, itching or hives, swelling of the face, lips, or tongue -shortness of breath or breathing problems -fever -pain, redness, or irritation at site where injected -pinpoint red spots on the skin -stomach or side pain, or pain at the shoulder -swelling -tiredness -trouble passing urine Side effects that usually do not require medical attention (report to your doctor or health care professional if they continue or are bothersome): -bone pain -muscle pain Where should I keep my medicine? Keep out of the reach of children. Store in a refrigerator between 2 and 8 degrees C (36 and 46 degrees F). Keep in carton to protect from light. Throw away this medicine if it is left out of the refrigerator for more than 5 consecutive days. Throw away any unused medicine after the expiration date.  2017 Elsevier/Gold Standard (2015-07-18 12:15:25)  

## 2016-06-09 ENCOUNTER — Ambulatory Visit (HOSPITAL_BASED_OUTPATIENT_CLINIC_OR_DEPARTMENT_OTHER): Payer: BLUE CROSS/BLUE SHIELD

## 2016-06-09 VITALS — BP 99/73 | HR 109 | Temp 97.6°F | Resp 18

## 2016-06-09 DIAGNOSIS — C787 Secondary malignant neoplasm of liver and intrahepatic bile duct: Secondary | ICD-10-CM | POA: Diagnosis not present

## 2016-06-09 DIAGNOSIS — C7931 Secondary malignant neoplasm of brain: Secondary | ICD-10-CM | POA: Diagnosis not present

## 2016-06-09 DIAGNOSIS — Z5189 Encounter for other specified aftercare: Secondary | ICD-10-CM

## 2016-06-09 DIAGNOSIS — C7951 Secondary malignant neoplasm of bone: Secondary | ICD-10-CM

## 2016-06-09 DIAGNOSIS — C50212 Malignant neoplasm of upper-inner quadrant of left female breast: Secondary | ICD-10-CM

## 2016-06-09 DIAGNOSIS — C50912 Malignant neoplasm of unspecified site of left female breast: Secondary | ICD-10-CM

## 2016-06-09 DIAGNOSIS — C7800 Secondary malignant neoplasm of unspecified lung: Secondary | ICD-10-CM

## 2016-06-09 MED ORDER — TBO-FILGRASTIM 300 MCG/0.5ML ~~LOC~~ SOSY
300.0000 ug | PREFILLED_SYRINGE | Freq: Once | SUBCUTANEOUS | Status: AC
  Administered 2016-06-09: 300 ug via SUBCUTANEOUS
  Filled 2016-06-09: qty 0.5

## 2016-06-09 NOTE — Patient Instructions (Signed)
Tbo-Filgrastim injection What is this medicine? TBO-FILGRASTIM (T B O fil GRA stim) is a granulocyte colony-stimulating factor that stimulates the growth of neutrophils, a type of white blood cell important in the body's fight against infection. It is used to reduce the incidence of fever and infection in patients with certain types of cancer who are receiving chemotherapy that affects the bone marrow. COMMON BRAND NAME(S): Granix What should I tell my health care provider before I take this medicine? They need to know if you have any of these conditions: -ongoing radiation therapy -sickle cell anemia -an unusual or allergic reaction to tbo-filgrastim, filgrastim, pegfilgrastim, other medicines, foods, dyes, or preservatives -pregnant or trying to get pregnant -breast-feeding How should I use this medicine? This medicine is for injection under the skin. If you get this medicine at home, you will be taught how to prepare and give this medicine. Refer to the Instructions for Use that come with your medication packaging. Use exactly as directed. Take your medicine at regular intervals. Do not take your medicine more often than directed. It is important that you put your used needles and syringes in a special sharps container. Do not put them in a trash can. If you do not have a sharps container, call your pharmacist or healthcare provider to get one. Talk to your pediatrician regarding the use of this medicine in children. Special care may be needed. What if I miss a dose? It is important not to miss your dose. Call your doctor or health care professional if you miss a dose. What may interact with this medicine? This medicine may interact with the following medications: -medicines that may cause a release of neutrophils, such as lithium What should I watch for while using this medicine? You may need blood work done while you are taking this medicine. What side effects may I notice from receiving  this medicine? Side effects that you should report to your doctor or health care professional as soon as possible: -allergic reactions like skin rash, itching or hives, swelling of the face, lips, or tongue -shortness of breath or breathing problems -fever -pain, redness, or irritation at site where injected -pinpoint red spots on the skin -stomach or side pain, or pain at the shoulder -swelling -tiredness -trouble passing urine Side effects that usually do not require medical attention (report to your doctor or health care professional if they continue or are bothersome): -bone pain -muscle pain Where should I keep my medicine? Keep out of the reach of children. Store in a refrigerator between 2 and 8 degrees C (36 and 46 degrees F). Keep in carton to protect from light. Throw away this medicine if it is left out of the refrigerator for more than 5 consecutive days. Throw away any unused medicine after the expiration date.  2017 Elsevier/Gold Standard (2015-07-18 12:15:25)  

## 2016-06-18 ENCOUNTER — Other Ambulatory Visit: Payer: Self-pay

## 2016-06-18 DIAGNOSIS — C50912 Malignant neoplasm of unspecified site of left female breast: Secondary | ICD-10-CM

## 2016-06-19 ENCOUNTER — Ambulatory Visit: Payer: BLUE CROSS/BLUE SHIELD

## 2016-06-19 ENCOUNTER — Encounter (HOSPITAL_BASED_OUTPATIENT_CLINIC_OR_DEPARTMENT_OTHER): Payer: BLUE CROSS/BLUE SHIELD | Admitting: Oncology

## 2016-06-19 ENCOUNTER — Ambulatory Visit (HOSPITAL_BASED_OUTPATIENT_CLINIC_OR_DEPARTMENT_OTHER): Payer: BLUE CROSS/BLUE SHIELD

## 2016-06-19 ENCOUNTER — Other Ambulatory Visit (HOSPITAL_BASED_OUTPATIENT_CLINIC_OR_DEPARTMENT_OTHER): Payer: BLUE CROSS/BLUE SHIELD

## 2016-06-19 ENCOUNTER — Telehealth: Payer: Self-pay | Admitting: Oncology

## 2016-06-19 ENCOUNTER — Telehealth: Payer: Self-pay | Admitting: *Deleted

## 2016-06-19 VITALS — BP 114/80 | HR 97 | Temp 97.8°F | Resp 16 | Ht 65.0 in | Wt 157.0 lb

## 2016-06-19 DIAGNOSIS — C7931 Secondary malignant neoplasm of brain: Secondary | ICD-10-CM | POA: Diagnosis not present

## 2016-06-19 DIAGNOSIS — C50212 Malignant neoplasm of upper-inner quadrant of left female breast: Secondary | ICD-10-CM

## 2016-06-19 DIAGNOSIS — C50922 Malignant neoplasm of unspecified site of left male breast: Secondary | ICD-10-CM

## 2016-06-19 DIAGNOSIS — Z5111 Encounter for antineoplastic chemotherapy: Secondary | ICD-10-CM

## 2016-06-19 DIAGNOSIS — Z170 Estrogen receptor positive status [ER+]: Secondary | ICD-10-CM

## 2016-06-19 DIAGNOSIS — C7951 Secondary malignant neoplasm of bone: Secondary | ICD-10-CM | POA: Diagnosis not present

## 2016-06-19 DIAGNOSIS — C7802 Secondary malignant neoplasm of left lung: Secondary | ICD-10-CM

## 2016-06-19 DIAGNOSIS — C50912 Malignant neoplasm of unspecified site of left female breast: Secondary | ICD-10-CM

## 2016-06-19 DIAGNOSIS — C7800 Secondary malignant neoplasm of unspecified lung: Secondary | ICD-10-CM

## 2016-06-19 DIAGNOSIS — C787 Secondary malignant neoplasm of liver and intrahepatic bile duct: Secondary | ICD-10-CM | POA: Diagnosis not present

## 2016-06-19 DIAGNOSIS — C7949 Secondary malignant neoplasm of other parts of nervous system: Secondary | ICD-10-CM

## 2016-06-19 DIAGNOSIS — C50919 Malignant neoplasm of unspecified site of unspecified female breast: Secondary | ICD-10-CM

## 2016-06-19 LAB — CBC WITH DIFFERENTIAL/PLATELET
BASO%: 0.4 % (ref 0.0–2.0)
BASOS ABS: 0 10*3/uL (ref 0.0–0.1)
EOS ABS: 0.1 10*3/uL (ref 0.0–0.5)
EOS%: 2 % (ref 0.0–7.0)
HCT: 33.3 % — ABNORMAL LOW (ref 34.8–46.6)
HGB: 10.5 g/dL — ABNORMAL LOW (ref 11.6–15.9)
LYMPH%: 26.4 % (ref 14.0–49.7)
MCH: 33.5 pg (ref 25.1–34.0)
MCHC: 31.5 g/dL (ref 31.5–36.0)
MCV: 106.4 fL — AB (ref 79.5–101.0)
MONO#: 0.4 10*3/uL (ref 0.1–0.9)
MONO%: 16.1 % — ABNORMAL HIGH (ref 0.0–14.0)
NEUT#: 1.4 10*3/uL — ABNORMAL LOW (ref 1.5–6.5)
NEUT%: 55.1 % (ref 38.4–76.8)
PLATELETS: 179 10*3/uL (ref 145–400)
RBC: 3.13 10*6/uL — AB (ref 3.70–5.45)
RDW: 16.4 % — ABNORMAL HIGH (ref 11.2–14.5)
WBC: 2.5 10*3/uL — ABNORMAL LOW (ref 3.9–10.3)
lymph#: 0.7 10*3/uL — ABNORMAL LOW (ref 0.9–3.3)

## 2016-06-19 LAB — COMPREHENSIVE METABOLIC PANEL
ALT: 46 U/L (ref 0–55)
ANION GAP: 5 meq/L (ref 3–11)
AST: 43 U/L — ABNORMAL HIGH (ref 5–34)
Albumin: 3.1 g/dL — ABNORMAL LOW (ref 3.5–5.0)
Alkaline Phosphatase: 152 U/L — ABNORMAL HIGH (ref 40–150)
BILIRUBIN TOTAL: 0.5 mg/dL (ref 0.20–1.20)
BUN: 6.7 mg/dL — ABNORMAL LOW (ref 7.0–26.0)
CHLORIDE: 108 meq/L (ref 98–109)
CO2: 24 meq/L (ref 22–29)
Calcium: 7.8 mg/dL — ABNORMAL LOW (ref 8.4–10.4)
Creatinine: 0.6 mg/dL (ref 0.6–1.1)
Glucose: 97 mg/dl (ref 70–140)
Potassium: 3.8 mEq/L (ref 3.5–5.1)
Sodium: 137 mEq/L (ref 136–145)
TOTAL PROTEIN: 5.7 g/dL — AB (ref 6.4–8.3)

## 2016-06-19 MED ORDER — OXYCODONE HCL ER 30 MG PO T12A: 30.0000 mg | EXTENDED_RELEASE_TABLET | Freq: Two times a day (BID) | ORAL | 0 refills | Status: DC

## 2016-06-19 MED ORDER — ACETAMINOPHEN 325 MG PO TABS
325.0000 mg | ORAL_TABLET | Freq: Once | ORAL | Status: AC
  Administered 2016-06-19: 325 mg via ORAL

## 2016-06-19 MED ORDER — SODIUM CHLORIDE 0.9 % IV SOLN
Freq: Once | INTRAVENOUS | Status: AC
  Administered 2016-06-19: 10:00:00 via INTRAVENOUS

## 2016-06-19 MED ORDER — HEPARIN SOD (PORK) LOCK FLUSH 100 UNIT/ML IV SOLN
500.0000 [IU] | Freq: Once | INTRAVENOUS | Status: AC | PRN
  Administered 2016-06-19: 500 [IU]
  Filled 2016-06-19: qty 5

## 2016-06-19 MED ORDER — SODIUM CHLORIDE 0.9% FLUSH
10.0000 mL | INTRAVENOUS | Status: DC | PRN
  Administered 2016-06-19: 10 mL via INTRAVENOUS
  Filled 2016-06-19: qty 10

## 2016-06-19 MED ORDER — PROCHLORPERAZINE MALEATE 10 MG PO TABS
10.0000 mg | ORAL_TABLET | Freq: Once | ORAL | Status: AC
  Administered 2016-06-19: 10 mg via ORAL

## 2016-06-19 MED ORDER — ACETAMINOPHEN 325 MG PO TABS
ORAL_TABLET | ORAL | Status: AC
  Filled 2016-06-19: qty 1

## 2016-06-19 MED ORDER — POLYETHYLENE GLYCOL 3350 17 G PO PACK: PACK | ORAL | 6 refills | Status: DC

## 2016-06-19 MED ORDER — SODIUM CHLORIDE 0.9% FLUSH
10.0000 mL | INTRAVENOUS | Status: DC | PRN
  Administered 2016-06-19: 10 mL
  Filled 2016-06-19: qty 10

## 2016-06-19 MED ORDER — PROCHLORPERAZINE MALEATE 10 MG PO TABS
ORAL_TABLET | ORAL | Status: AC
  Filled 2016-06-19: qty 1

## 2016-06-19 MED ORDER — DENOSUMAB 120 MG/1.7ML ~~LOC~~ SOLN
120.0000 mg | Freq: Once | SUBCUTANEOUS | Status: AC
  Administered 2016-06-19: 120 mg via SUBCUTANEOUS
  Filled 2016-06-19: qty 1.7

## 2016-06-19 MED ORDER — SODIUM CHLORIDE 0.9 % IV SOLN
600.0000 mg/m2 | Freq: Once | INTRAVENOUS | Status: AC
  Administered 2016-06-19: 1064 mg via INTRAVENOUS
  Filled 2016-06-19: qty 27.98

## 2016-06-19 NOTE — Telephone Encounter (Signed)
Gave patient avs report and appointments for December and January  °

## 2016-06-19 NOTE — Progress Notes (Signed)
Ok to treat with [facility] today per Tivis Ringer RN, per MD Magrinat   Verbal and written instructions given on taking 3 extra calcium tablets today and tomorrow per pharmacy and MD Magrinat. Patient verbalizes understanding.

## 2016-06-19 NOTE — Progress Notes (Signed)
Jill Johnston  [phone]) [phone] Fax:(336) [phone]     ID: [name] DOB: Dec 26, 1965  MR#: [id number]  HGD#:[id number]  Patient Care Team: [name], MD as PCP - General (Internal Medicine) [name], MD as Consulting Physician (Radiation Oncology) [name] as Consulting Physician (Internal Medicine) [name], MD OTHER MD:  CHIEF COMPLAINT: Estrogen receptor positive metastatic breast cancer  CURRENT TREATMENT: Gemcitabine   BREAST CANCER HISTORY: From Dr. 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 The breast cancer was ER + 100%, PR 1%, HER 2 IHC/FISH negative, Ki67 52%, BRCA 1/2 negative, initial CA 2729 WNL. She had neoadjuvant AC x4 taxol x12 dose dense from 06-10-2007 thru 09-28-2007 by Dr B.Darovsky. 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 She had radiation to chest wall, supraclavicular region and left axilla 50.4 Gy with boost to mastectomy scar to 60.4 Gy, by Dr [name] from 12-15-2007 thru 01-31-2008. She was on tamoxifen from 02-20-2008 thru 09-25-2014. She developed pain in ribs 08-2014, with CA 2729 52 and PET CT and MRI with diffuse bone metastatic disease, no cord compression, small lung lesions and intrathoracic adenopathy. 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 She began Denosumab 120 mcg monthly starting 4-7-201, and letrozole + palbociclib beginning4-12-2014. She was seen in consultation by Dr [name] [phone]. Palbociclib dose decreased to 100 mg daily x 21 q 28 days due to neutropenia. CBC on 10-25-15: WBC 2.1, Hgb 13, plt 186, ANC 0.9 PET 09-25-2014 in [facility] system and CT CAP at  [place] 10-25-15 with no clear pulmonary involvement, stable 2 cm lesion at dome of liver and 11 mm left hepatic lobe lesion. She had right tomo mammogram at [facility] 09-17-15, with heterogeneously dense breast tissue but no other mammographic findings of concern.  CA 2729 increased from 38 in 08-2015 to 49 in April 2017 and 65 in May 2017. CT CAP / PET 03-16-16 showed progression lung, liver and bone. Letrozole and ibrance DCd on 03-19-16, with plans to begin chemotherapy. 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 She elected whole brain RT, given by Dr [name] ~ 03-23-16 thru 04-10-16.  She had first gemzar on 04-24-16  She had evaluation for vaginal bleeding "from polyp", follows yearly with gynecologist in [place], up to date."  Her subsequent history is as detailed below.  INTERVAL HISTORY: Jill Johnston returns today for follow-up of her metastatic breast cancer. Her husband [name] unable to come today. Today is day 1 cycle 3 of Gemzar, given day 1 and day 8 of each 21 day cycle. Because of cytopenias and dose delays her dose has been reduced and Neupogen has been added (2 doses after each chemo treatment)  REVIEW OF SYSTEMS: [name] does generally well with her treatment. She sleeps a lot she says on the day after treatment. After she recovers she still sleeps during the day, 2 or 3 hours, but then she doesn't go to bed until about one in the morning. Generally she is "okay". She does a little cooking. Her husband does most of the cleaning. During the day she reads or watches TV. They  recently went to [facility] and she walked around for almost an hour pushing the car. Her pain is well-controlled on OxyContin twice a day. She rarely needs to use the oxycodone breakthrough. She uses MiraLAX with some success but needs a refill. She has had no fever and no mouth sores and her port works well.  She is not getting bony aches from her Neulasta shots. She is "bored" because she is used to being active and now she "can't do anything". She is not driving. A detailed review of systems today was otherwise stable  PAST MEDICAL HISTORY: Past Medical History:  Diagnosis Date  . Anxiety   . Breast cancer ([place])    Stage IV, ER positive left upper outer quadrant breast cancer with metastasis to bone  . Breast cancer metastasized to multiple sites ([place]) 04/12/2016   bone, liver, lung and brain  . GERD (gastroesophageal reflux disease)   . Hypertension     PAST SURGICAL HISTORY: Past Surgical History:  Procedure Laterality Date  . CHOLECYSTECTOMY    . ESOPHAGOGASTRODUODENOSCOPY (EGD) WITH PROPOFOL N/A 07/12/2015   Procedure: ESOPHAGOGASTRODUODENOSCOPY (EGD) WITH PROPOFOL;  Surgeon: Rogene Houston, MD;  Location: [facility];  Service: Endoscopy;  Laterality: N/A;  1:00  . IR GENERIC HISTORICAL  04/22/2016   IR US GUIDE VASC ACCESS RIGHT 04/22/2016 Markus Daft, MD [facility]-INTERV RAD  . IR GENERIC HISTORICAL  04/22/2016   IR FLUORO GUIDE PORT INSERTION RIGHT 04/22/2016 Markus Daft, MD [facility]-INTER[facility] RAD  . MASTECTOMY  April 2009   Left breast with lymph node resection    FAMILY HISTORY Family History  Problem Relation Age of Onset  . Heart attack Mother     Died age 25  . Heart disease Father     Died age 78  . Brain cancer Father     pt not sure if he actually had this  . Cancer Maternal Grandmother     dx. NOS cancer at older age; d. 15  . Heart attack Maternal Grandfather 79  . Colon cancer Paternal Grandmother     d. 27s  The patient's father died at age 70 she believes from heart disease. The patient's mother died at age 67 also from a myocardial infarction. The patient had one brother, no sisters. There is no history of breast or ovarian cancer in the family despite extensive G neurologic research   GYNECOLOGIC HISTORY:  No LMP recorded. Patient is not currently having periods  (Reason: Chemotherapy).  menarche age 61, first live birth age 42, the patient is GX P1. She stopped having periods with chemotherapy in 2009 and these have not resumed.   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 They recently moved to [place] from [place]. Their son [name] is 38 years old as of November 2017 . The patient is [place] [employer]      ADVANCED DIRECTIVES:  the patient's husband is her healthcare power of attorney. There is no living will in place    HEALTH MAINTENANCE: Social History  Substance Use Topics  . Smoking status: Never Smoker  . Smokeless tobacco: Never Used  . Alcohol use No     Colonoscopy:  PAP:  Bone density:   No Known Allergies  Current Outpatient Prescriptions  Medication Sig Dispense Refill  . docusate sodium (COLACE) 100 MG capsule Take 100 mg by mouth daily.     . fluconazole (DIFLUCAN) 100  MG tablet Take 1 tablet (100 mg total) by mouth daily as needed (for thrush). 10 tablet 0  . ibuprofen (ADVIL,MOTRIN) 200 MG tablet Take 400 mg by mouth daily as needed.    . lidocaine-prilocaine (EMLA) cream Apply 1 application topically as needed. To port 1 hour before going to be accessed with needle. Cover with plastic wrap. 30 g 1  . lisinopril (PRINIVIL,ZESTRIL) 10 MG tablet Take 1 tablet (10 mg total) by mouth daily. 30 tablet 0  . oxyCODONE (OXYCONTIN) 30 MG 12 hr tablet Take 30 mg by mouth 2 (two) times daily. 60 each 0  . Oxycodone HCl 10 MG TABS Take 1 tablet by mouth every 3 (three) hours as needed for pain.    . pantoprazole (PROTONIX) 40 MG tablet TAKE 1 TABLET(40 MG) BY MOUTH TWICE DAILY BEFORE A MEAL 60 tablet 5  . polyethylene glycol (MIRALAX / GLYCOLAX) packet Take 17 grams by mouth every day. 14 each 6   No current facility-administered medications for this visit.    Facility-Administered Medications Ordered in Other Visits    Medication Dose Route Frequency Provider Last Rate Last Dose  . filgrastim (NEUPOGEN) injection 300 mcg  300 mcg Subcutaneous Once [name], MD        OBJECTIVE: Middle-aged white woman who appears stated age  Vitals:   06/19/16 0846  BP: 114/80  Pulse: 97  Resp: 16  Temp: 97.8 F (36.6 C)     Body mass index is 26.13 kg/m.    ECOG FS:2 - Symptomatic, <50% confined to bed  Sclerae unicteric, pupils round and equal Oropharynx clear and moist-- no thrush or other lesions No cervical or supraclavicular adenopathy Lungs no rales or rhonchi Heart regular rate and rhythm Abd soft, nontender, positive bowel sounds MSK no focal spinal tenderness, no upper extremity lymphedema Neuro: nonfocal, well oriented, appropriate affect Breasts: Deferred    LAB RESULTS:  CMP     Component Value Date/Time   NA 137 06/19/2016 0828   K 3.8 06/19/2016 0828   CL 105 05/20/2016 0930   CO2 24 06/19/2016 0828   GLUCOSE 97 06/19/2016 0828   BUN 6.7 (L) 06/19/2016 0828   CREATININE 0.6 06/19/2016 0828   CALCIUM 7.8 (L) 06/19/2016 0828   PROT 5.7 (L) 06/19/2016 0828   ALBUMIN 3.1 (L) 06/19/2016 0828   AST 43 (H) 06/19/2016 0828   ALT 46 06/19/2016 0828   ALKPHOS 152 (H) 06/19/2016 0828   BILITOT 0.50 06/19/2016 0828   GFRNONAA >60 05/20/2016 0930   GFRAA >60 05/20/2016 0930    INo results found for: SPEP, UPEP  Lab Results  Component Value Date   WBC 2.5 (L) 06/19/2016   NEUTROABS 1.4 (L) 06/19/2016   HGB 10.5 (L) 06/19/2016   HCT 33.3 (L) 06/19/2016   MCV 106.4 (H) 06/19/2016   PLT 179 06/19/2016      Chemistry      Component Value Date/Time   NA 137 06/19/2016 0828   K 3.8 06/19/2016 0828   CL 105 05/20/2016 0930   CO2 24 06/19/2016 0828   BUN 6.7 (L) 06/19/2016 0828   CREATININE 0.6 06/19/2016 0828      Component Value Date/Time   CALCIUM 7.8 (L) 06/19/2016 0828   ALKPHOS 152 (H) 06/19/2016 0828   AST 43 (H) 06/19/2016 0828   ALT 46 06/19/2016 0828    BILITOT 0.50 06/19/2016 0828       Lab Results  Component Value Date   LABCA2 98 (H) 02/27/2016  No components found for: LABCA125  No results for input(s): INR in the last 168 hours.  Urinalysis    Component Value Date/Time   COLORURINE ORANGE (A) 05/17/2016 0852   APPEARANCEUR CLOUDY (A) 05/17/2016 0852   LABSPEC 1.027 05/17/2016 0852   PHURINE 6.0 05/17/2016 0852   GLUCOSEU NEGATIVE 05/17/2016 0852   HGBUR NEGATIVE 05/17/2016 0852   BILIRUBINUR SMALL (A) 05/17/2016 0852   KETONESUR NEGATIVE 05/17/2016 0852   PROTEINUR 30 (A) 05/17/2016 0852   NITRITE NEGATIVE 05/17/2016 0852   LEUKOCYTESUR TRACE (A) 05/17/2016 0852     STUDIES: No results found.  ELIGIBLE FOR AVAILABLE RESEARCH PROTOCOL: no  ASSESSMENT: 50 y.o. BRCA negative [place] woman originally from [place]  (1) status post left breast upper inner quadrant biopsy 05/17/2007 for a clinical mT2 N2, stage IIIA invasive ductal breast cancer, strongly estrogen receptor positive, weakly progesterone receptor positive, HER-2 negative, with an MIB-1 of 52%  (2) neoadjuvant chemotherapy consisted of dose dense doxorubicin and cyclophosphamide 4 followed by weekly paclitaxel 12, started 06/10/2007, completed 09/28/2007  (3) status post left modified radical mastectomy 10/27/2007 at [facility] [phone]) removed that invasive ductal carcinoma, grade 2, pT2, pN1a (downstaged to IIB), estrogen receptor strongly positive, progesterone receptor weakly positive, HER-2 not amplified by immunohistochemistry or FISH with a signals ratio of 0.94, number per cell 3.4  (4) status post adjuvant radiation to the left chest wall as well as the left supraclavicular and axillary nodal regions (50.4 gray +10 Gray boost) given between 12/15/2007 on 01/31/2008  (5) on adjuvant tamoxifen 02/20/2008 through March 2016  METASTATIC DISEASE: March 2016, presenting with bone pain (6) staging studies April 2016 showed diffuse bony metastatic  disease, intrathoracic adenopathy, and small lung lesions.  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  (10) repeat genetic testing 04/23/2016 through the [facility] Hereditary Cancer Panel offered by [facility] found no deleterious mutations in APC, ATM, BARD1, BMPR1A, BRCA1, BRCA2, BRIP1, CHD1, CDK4, CDKN2A, CHEK2, EPCAM (large rearrangement only), GREM1/SCG5, MLH1, MSH2, MSH6, MUTYH, NBN, PALB2, PMS2, POLD1, POLE, PTEN, RAD51C, RAD51D, SMAD4, STK11, and TP53  (11) gemcitabine started 04/24/2016, to be given days 1 and 8 of each 21 day cycle  (a) day 8 cycle 1 delayed because of cytopenias--dose reduced and neupogen added   PLAN: Haynes Dage is tolerating her treatment moderately well. Her counts are holding up if we give her Neupogen 2 and we will do that after this dose and after the one next week. With the holidays this means the Neupogen will be days 5 and 6.  She will then see me again on January 12 for the start of her fourth cycle. She will need restaging studies the fourth week in January, before proceeding to cycle 5. That will include a bone scan, chest CT, consideration of liver MRI, and brain MRI.  I refilled her OxyContin. This is working well for her. Her bowel prophylaxis needs a little help and I also refilled her MiraLAX. She takes stool softeners regularly.  I encouraged her walking, but  she really misses her  more active life and she is "bored" with her current life. She is not depressed however. It is a positive thing that her pain is well-coon her current medicines  She knows to call for any problems that may develop before her next visit.   [name], MD   06/19/2016 9:43 AM Medical Oncology and Hematology [facility] [address] [place], [place] [address] Tel. [phone]    Fax. [phone]

## 2016-06-19 NOTE — Telephone Encounter (Signed)
This RN was contacted by Ebony Hail in phx stating borderline low calcium per " corrected calcium is 8.2 " and pending xgyeva - " does Dr Jana Hakim want to give ?"  This RN requested for pharmacy to inquire with pt current intake of supplement calicium - verify she is taking and at what dose.  Ebony Hail called with update and stated pt is not currently on calcium - has 500 mg tablets in home.  Per above pt is to take 500 mg TID and may have xgyeva as ordered.

## 2016-06-19 NOTE — Addendum Note (Signed)
Addended by: [name] on: 06/19/2016 10:12 AM   Modules accepted: Orders

## 2016-06-19 NOTE — Patient Instructions (Signed)
[facility] Discharge Instructions for Patients Receiving Chemotherapy  Today you received the following chemotherapy agents Gemzar  To help prevent nausea and vomiting after your treatment, we encourage you to take your nausea medication as directed.    If you develop nausea and vomiting that is not controlled by your nausea medication, call the clinic.   BELOW ARE SYMPTOMS THAT SHOULD BE REPORTED IMMEDIATELY:  *FEVER GREATER THAN 100.5 F  *CHILLS WITH OR WITHOUT FEVER  NAUSEA AND VOMITING THAT IS NOT CONTROLLED WITH YOUR NAUSEA MEDICATION  *UNUSUAL SHORTNESS OF BREATH  *UNUSUAL BRUISING OR BLEEDING  TENDERNESS IN MOUTH AND THROAT WITH OR WITHOUT PRESENCE OF ULCERS  *URINARY PROBLEMS  *BOWEL PROBLEMS  UNUSUAL RASH Items with * indicate a potential emergency and should be followed up as soon as possible.  Feel free to call the clinic you have any questions or concerns. The clinic phone number is [phone].  Please show the CHEMO ALERT CARD at check-in to the Emergency Department and triage nurse.   

## 2016-06-23 ENCOUNTER — Ambulatory Visit (HOSPITAL_BASED_OUTPATIENT_CLINIC_OR_DEPARTMENT_OTHER): Payer: BLUE CROSS/BLUE SHIELD

## 2016-06-23 VITALS — BP 119/84 | HR 100 | Temp 98.2°F | Resp 18

## 2016-06-23 DIAGNOSIS — C50212 Malignant neoplasm of upper-inner quadrant of left female breast: Secondary | ICD-10-CM

## 2016-06-23 DIAGNOSIS — Z5189 Encounter for other specified aftercare: Secondary | ICD-10-CM

## 2016-06-23 DIAGNOSIS — C50912 Malignant neoplasm of unspecified site of left female breast: Secondary | ICD-10-CM

## 2016-06-23 DIAGNOSIS — C7931 Secondary malignant neoplasm of brain: Secondary | ICD-10-CM

## 2016-06-23 DIAGNOSIS — C7951 Secondary malignant neoplasm of bone: Secondary | ICD-10-CM

## 2016-06-23 DIAGNOSIS — C787 Secondary malignant neoplasm of liver and intrahepatic bile duct: Secondary | ICD-10-CM | POA: Diagnosis not present

## 2016-06-23 DIAGNOSIS — C7800 Secondary malignant neoplasm of unspecified lung: Secondary | ICD-10-CM

## 2016-06-23 MED ORDER — TBO-FILGRASTIM 300 MCG/0.5ML ~~LOC~~ SOSY
300.0000 ug | PREFILLED_SYRINGE | Freq: Once | SUBCUTANEOUS | Status: AC
  Administered 2016-06-23: 300 ug via SUBCUTANEOUS
  Filled 2016-06-23: qty 0.5

## 2016-06-23 NOTE — Patient Instructions (Signed)
Tbo-Filgrastim injection What is this medicine? TBO-FILGRASTIM (T B O fil GRA stim) is a granulocyte colony-stimulating factor that stimulates the growth of neutrophils, a type of white blood cell important in the body's fight against infection. It is used to reduce the incidence of fever and infection in patients with certain types of cancer who are receiving chemotherapy that affects the bone marrow. COMMON BRAND NAME(S): Granix What should I tell my health care provider before I take this medicine? They need to know if you have any of these conditions: -ongoing radiation therapy -sickle cell anemia -an unusual or allergic reaction to tbo-filgrastim, filgrastim, pegfilgrastim, other medicines, foods, dyes, or preservatives -pregnant or trying to get pregnant -breast-feeding How should I use this medicine? This medicine is for injection under the skin. If you get this medicine at home, you will be taught how to prepare and give this medicine. Refer to the Instructions for Use that come with your medication packaging. Use exactly as directed. Take your medicine at regular intervals. Do not take your medicine more often than directed. It is important that you put your used needles and syringes in a special sharps container. Do not put them in a trash can. If you do not have a sharps container, call your pharmacist or healthcare provider to get one. Talk to your pediatrician regarding the use of this medicine in children. Special care may be needed. What if I miss a dose? It is important not to miss your dose. Call your doctor or health care professional if you miss a dose. What may interact with this medicine? This medicine may interact with the following medications: -medicines that may cause a release of neutrophils, such as lithium What should I watch for while using this medicine? You may need blood work done while you are taking this medicine. What side effects may I notice from receiving  this medicine? Side effects that you should report to your doctor or health care professional as soon as possible: -allergic reactions like skin rash, itching or hives, swelling of the face, lips, or tongue -shortness of breath or breathing problems -fever -pain, redness, or irritation at site where injected -pinpoint red spots on the skin -stomach or side pain, or pain at the shoulder -swelling -tiredness -trouble passing urine Side effects that usually do not require medical attention (report to your doctor or health care professional if they continue or are bothersome): -bone pain -muscle pain Where should I keep my medicine? Keep out of the reach of children. Store in a refrigerator between 2 and 8 degrees C (36 and 46 degrees F). Keep in carton to protect from light. Throw away this medicine if it is left out of the refrigerator for more than 5 consecutive days. Throw away any unused medicine after the expiration date.  2017 Elsevier/Gold Standard (2015-07-18 12:15:25)  

## 2016-06-24 ENCOUNTER — Ambulatory Visit (HOSPITAL_BASED_OUTPATIENT_CLINIC_OR_DEPARTMENT_OTHER): Payer: BLUE CROSS/BLUE SHIELD

## 2016-06-24 VITALS — BP 105/78 | HR 105 | Temp 97.7°F | Resp 20

## 2016-06-24 DIAGNOSIS — C7931 Secondary malignant neoplasm of brain: Secondary | ICD-10-CM | POA: Diagnosis not present

## 2016-06-24 DIAGNOSIS — Z5189 Encounter for other specified aftercare: Secondary | ICD-10-CM

## 2016-06-24 DIAGNOSIS — C50912 Malignant neoplasm of unspecified site of left female breast: Secondary | ICD-10-CM

## 2016-06-24 DIAGNOSIS — C787 Secondary malignant neoplasm of liver and intrahepatic bile duct: Secondary | ICD-10-CM

## 2016-06-24 DIAGNOSIS — C7951 Secondary malignant neoplasm of bone: Secondary | ICD-10-CM

## 2016-06-24 DIAGNOSIS — C7800 Secondary malignant neoplasm of unspecified lung: Secondary | ICD-10-CM

## 2016-06-24 MED ORDER — TBO-FILGRASTIM 300 MCG/0.5ML ~~LOC~~ SOSY
300.0000 ug | PREFILLED_SYRINGE | Freq: Once | SUBCUTANEOUS | Status: AC
  Administered 2016-06-24: 300 ug via SUBCUTANEOUS
  Filled 2016-06-24: qty 0.5

## 2016-06-24 NOTE — Patient Instructions (Signed)
Tbo-Filgrastim injection What is this medicine? TBO-FILGRASTIM (T B O fil GRA stim) is a granulocyte colony-stimulating factor that stimulates the growth of neutrophils, a type of white blood cell important in the body's fight against infection. It is used to reduce the incidence of fever and infection in patients with certain types of cancer who are receiving chemotherapy that affects the bone marrow. COMMON BRAND NAME(S): Granix What should I tell my health care provider before I take this medicine? They need to know if you have any of these conditions: -ongoing radiation therapy -sickle cell anemia -an unusual or allergic reaction to tbo-filgrastim, filgrastim, pegfilgrastim, other medicines, foods, dyes, or preservatives -pregnant or trying to get pregnant -breast-feeding How should I use this medicine? This medicine is for injection under the skin. If you get this medicine at home, you will be taught how to prepare and give this medicine. Refer to the Instructions for Use that come with your medication packaging. Use exactly as directed. Take your medicine at regular intervals. Do not take your medicine more often than directed. It is important that you put your used needles and syringes in a special sharps container. Do not put them in a trash can. If you do not have a sharps container, call your pharmacist or healthcare provider to get one. Talk to your pediatrician regarding the use of this medicine in children. Special care may be needed. What if I miss a dose? It is important not to miss your dose. Call your doctor or health care professional if you miss a dose. What may interact with this medicine? This medicine may interact with the following medications: -medicines that may cause a release of neutrophils, such as lithium What should I watch for while using this medicine? You may need blood work done while you are taking this medicine. What side effects may I notice from receiving  this medicine? Side effects that you should report to your doctor or health care professional as soon as possible: -allergic reactions like skin rash, itching or hives, swelling of the face, lips, or tongue -shortness of breath or breathing problems -fever -pain, redness, or irritation at site where injected -pinpoint red spots on the skin -stomach or side pain, or pain at the shoulder -swelling -tiredness -trouble passing urine Side effects that usually do not require medical attention (report to your doctor or health care professional if they continue or are bothersome): -bone pain -muscle pain Where should I keep my medicine? Keep out of the reach of children. Store in a refrigerator between 2 and 8 degrees C (36 and 46 degrees F). Keep in carton to protect from light. Throw away this medicine if it is left out of the refrigerator for more than 5 consecutive days. Throw away any unused medicine after the expiration date.  2017 Elsevier/Gold Standard (2015-07-18 12:15:25)  

## 2016-06-26 ENCOUNTER — Ambulatory Visit (HOSPITAL_BASED_OUTPATIENT_CLINIC_OR_DEPARTMENT_OTHER): Payer: BLUE CROSS/BLUE SHIELD

## 2016-06-26 ENCOUNTER — Other Ambulatory Visit (HOSPITAL_BASED_OUTPATIENT_CLINIC_OR_DEPARTMENT_OTHER): Payer: BLUE CROSS/BLUE SHIELD

## 2016-06-26 ENCOUNTER — Ambulatory Visit: Payer: BLUE CROSS/BLUE SHIELD

## 2016-06-26 VITALS — BP 113/81 | HR 94 | Temp 97.9°F | Resp 20

## 2016-06-26 DIAGNOSIS — C7951 Secondary malignant neoplasm of bone: Principal | ICD-10-CM

## 2016-06-26 DIAGNOSIS — C50919 Malignant neoplasm of unspecified site of unspecified female breast: Secondary | ICD-10-CM

## 2016-06-26 DIAGNOSIS — C50922 Malignant neoplasm of unspecified site of left male breast: Secondary | ICD-10-CM

## 2016-06-26 DIAGNOSIS — C7949 Secondary malignant neoplasm of other parts of nervous system: Secondary | ICD-10-CM

## 2016-06-26 DIAGNOSIS — Z5111 Encounter for antineoplastic chemotherapy: Secondary | ICD-10-CM

## 2016-06-26 DIAGNOSIS — C7931 Secondary malignant neoplasm of brain: Secondary | ICD-10-CM | POA: Diagnosis not present

## 2016-06-26 DIAGNOSIS — C50912 Malignant neoplasm of unspecified site of left female breast: Secondary | ICD-10-CM

## 2016-06-26 DIAGNOSIS — C787 Secondary malignant neoplasm of liver and intrahepatic bile duct: Secondary | ICD-10-CM

## 2016-06-26 DIAGNOSIS — C7802 Secondary malignant neoplasm of left lung: Secondary | ICD-10-CM

## 2016-06-26 DIAGNOSIS — C7800 Secondary malignant neoplasm of unspecified lung: Secondary | ICD-10-CM

## 2016-06-26 LAB — CBC WITH DIFFERENTIAL/PLATELET
BASO%: 0.3 % (ref 0.0–2.0)
Basophils Absolute: 0 10e3/uL (ref 0.0–0.1)
EOS%: 0.6 % (ref 0.0–7.0)
Eosinophils Absolute: 0.1 10e3/uL (ref 0.0–0.5)
HCT: 31.7 % — ABNORMAL LOW (ref 34.8–46.6)
HGB: 10.4 g/dL — ABNORMAL LOW (ref 11.6–15.9)
LYMPH%: 9.6 % — ABNORMAL LOW (ref 14.0–49.7)
MCH: 34.5 pg — ABNORMAL HIGH (ref 25.1–34.0)
MCHC: 33 g/dL (ref 31.5–36.0)
MCV: 104.7 fL — ABNORMAL HIGH (ref 79.5–101.0)
MONO#: 0.8 10e3/uL (ref 0.1–0.9)
MONO%: 8.2 % (ref 0.0–14.0)
NEUT#: 8 10e3/uL — ABNORMAL HIGH (ref 1.5–6.5)
NEUT%: 81.3 % — ABNORMAL HIGH (ref 38.4–76.8)
Platelets: 142 10e3/uL — ABNORMAL LOW (ref 145–400)
RBC: 3.02 10e6/uL — ABNORMAL LOW (ref 3.70–5.45)
RDW: 16.4 % — ABNORMAL HIGH (ref 11.2–14.5)
WBC: 9.8 10e3/uL (ref 3.9–10.3)
lymph#: 0.9 10e3/uL (ref 0.9–3.3)

## 2016-06-26 LAB — COMPREHENSIVE METABOLIC PANEL WITH GFR
ALT: 68 U/L — ABNORMAL HIGH (ref 0–55)
AST: 55 U/L — ABNORMAL HIGH (ref 5–34)
Albumin: 3 g/dL — ABNORMAL LOW (ref 3.5–5.0)
Alkaline Phosphatase: 161 U/L — ABNORMAL HIGH (ref 40–150)
Anion Gap: 9 meq/L (ref 3–11)
BUN: <4 mg/dL — ABNORMAL LOW (ref 7.0–26.0)
CO2: 23 meq/L (ref 22–29)
Calcium: 8.1 mg/dL — ABNORMAL LOW (ref 8.4–10.4)
Chloride: 109 meq/L (ref 98–109)
Creatinine: 0.6 mg/dL (ref 0.6–1.1)
EGFR: >90 ml/min/1.73 m2
Glucose: 128 mg/dL (ref 70–140)
Potassium: 3.7 meq/L (ref 3.5–5.1)
Sodium: 141 meq/L (ref 136–145)
Total Bilirubin: 0.47 mg/dL (ref 0.20–1.20)
Total Protein: 5.7 g/dL — ABNORMAL LOW (ref 6.4–8.3)

## 2016-06-26 MED ORDER — GEMCITABINE HCL CHEMO INJECTION 1 GM/26.3ML
600.0000 mg/m2 | Freq: Once | INTRAVENOUS | Status: AC
  Administered 2016-06-26: 1064 mg via INTRAVENOUS
  Filled 2016-06-26: qty 27.98

## 2016-06-26 MED ORDER — SODIUM CHLORIDE 0.9 % IV SOLN
Freq: Once | INTRAVENOUS | Status: AC
  Administered 2016-06-26: 13:00:00 via INTRAVENOUS

## 2016-06-26 MED ORDER — PROCHLORPERAZINE MALEATE 10 MG PO TABS
ORAL_TABLET | ORAL | Status: AC
  Filled 2016-06-26: qty 1

## 2016-06-26 MED ORDER — ACETAMINOPHEN 325 MG PO TABS
325.0000 mg | ORAL_TABLET | Freq: Once | ORAL | Status: AC
  Administered 2016-06-26: 325 mg via ORAL

## 2016-06-26 MED ORDER — ACETAMINOPHEN 325 MG PO TABS
ORAL_TABLET | ORAL | Status: AC
  Filled 2016-06-26: qty 1

## 2016-06-26 MED ORDER — HEPARIN SOD (PORK) LOCK FLUSH 100 UNIT/ML IV SOLN
500.0000 [IU] | Freq: Once | INTRAVENOUS | Status: AC | PRN
  Administered 2016-06-26: 500 [IU]
  Filled 2016-06-26: qty 5

## 2016-06-26 MED ORDER — SODIUM CHLORIDE 0.9% FLUSH
10.0000 mL | INTRAVENOUS | Status: DC | PRN
  Administered 2016-06-26: 10 mL via INTRAVENOUS
  Filled 2016-06-26: qty 10

## 2016-06-26 MED ORDER — PROCHLORPERAZINE MALEATE 10 MG PO TABS
10.0000 mg | ORAL_TABLET | Freq: Once | ORAL | Status: AC
  Administered 2016-06-26: 10 mg via ORAL

## 2016-06-26 MED ORDER — SODIUM CHLORIDE 0.9% FLUSH
10.0000 mL | INTRAVENOUS | Status: DC | PRN
  Administered 2016-06-26: 10 mL
  Filled 2016-06-26: qty 10

## 2016-06-26 NOTE — Patient Instructions (Signed)

## 2016-06-26 NOTE — Patient Instructions (Signed)
[facility] Discharge Instructions for Patients Receiving Chemotherapy  Today you received the following chemotherapy agents Gemzar  To help prevent nausea and vomiting after your treatment, we encourage you to take your nausea medication as directed.    If you develop nausea and vomiting that is not controlled by your nausea medication, call the clinic.   BELOW ARE SYMPTOMS THAT SHOULD BE REPORTED IMMEDIATELY:  *FEVER GREATER THAN 100.5 F  *CHILLS WITH OR WITHOUT FEVER  NAUSEA AND VOMITING THAT IS NOT CONTROLLED WITH YOUR NAUSEA MEDICATION  *UNUSUAL SHORTNESS OF BREATH  *UNUSUAL BRUISING OR BLEEDING  TENDERNESS IN MOUTH AND THROAT WITH OR WITHOUT PRESENCE OF ULCERS  *URINARY PROBLEMS  *BOWEL PROBLEMS  UNUSUAL RASH Items with * indicate a potential emergency and should be followed up as soon as possible.  Feel free to call the clinic you have any questions or concerns. The clinic phone number is [phone].  Please show the CHEMO ALERT CARD at check-in to the Emergency Department and triage nurse.   

## 2016-06-27 LAB — CANCER ANTIGEN 27.29: CA 27.29: 54.3 U/mL — ABNORMAL HIGH (ref 0.0–38.6)

## 2016-06-30 ENCOUNTER — Ambulatory Visit (HOSPITAL_BASED_OUTPATIENT_CLINIC_OR_DEPARTMENT_OTHER): Payer: BLUE CROSS/BLUE SHIELD

## 2016-06-30 VITALS — HR 85 | Temp 97.8°F | Resp 18

## 2016-06-30 DIAGNOSIS — C787 Secondary malignant neoplasm of liver and intrahepatic bile duct: Secondary | ICD-10-CM | POA: Diagnosis not present

## 2016-06-30 DIAGNOSIS — Z5189 Encounter for other specified aftercare: Secondary | ICD-10-CM

## 2016-06-30 DIAGNOSIS — C7931 Secondary malignant neoplasm of brain: Secondary | ICD-10-CM | POA: Diagnosis not present

## 2016-06-30 DIAGNOSIS — C7951 Secondary malignant neoplasm of bone: Secondary | ICD-10-CM

## 2016-06-30 DIAGNOSIS — C50912 Malignant neoplasm of unspecified site of left female breast: Secondary | ICD-10-CM | POA: Diagnosis not present

## 2016-06-30 DIAGNOSIS — C7800 Secondary malignant neoplasm of unspecified lung: Secondary | ICD-10-CM

## 2016-06-30 MED ORDER — TBO-FILGRASTIM 300 MCG/0.5ML ~~LOC~~ SOSY
300.0000 ug | PREFILLED_SYRINGE | Freq: Once | SUBCUTANEOUS | Status: AC
  Administered 2016-06-30: 300 ug via SUBCUTANEOUS
  Filled 2016-06-30: qty 0.5

## 2016-06-30 NOTE — Patient Instructions (Signed)
Tbo-Filgrastim injection What is this medicine? TBO-FILGRASTIM (T B O fil GRA stim) is a granulocyte colony-stimulating factor that stimulates the growth of neutrophils, a type of white blood cell important in the body's fight against infection. It is used to reduce the incidence of fever and infection in patients with certain types of cancer who are receiving chemotherapy that affects the bone marrow. COMMON BRAND NAME(S): Granix What should I tell my health care provider before I take this medicine? They need to know if you have any of these conditions: -ongoing radiation therapy -sickle cell anemia -an unusual or allergic reaction to tbo-filgrastim, filgrastim, pegfilgrastim, other medicines, foods, dyes, or preservatives -pregnant or trying to get pregnant -breast-feeding How should I use this medicine? This medicine is for injection under the skin. If you get this medicine at home, you will be taught how to prepare and give this medicine. Refer to the Instructions for Use that come with your medication packaging. Use exactly as directed. Take your medicine at regular intervals. Do not take your medicine more often than directed. It is important that you put your used needles and syringes in a special sharps container. Do not put them in a trash can. If you do not have a sharps container, call your pharmacist or healthcare provider to get one. Talk to your pediatrician regarding the use of this medicine in children. Special care may be needed. What if I miss a dose? It is important not to miss your dose. Call your doctor or health care professional if you miss a dose. What may interact with this medicine? This medicine may interact with the following medications: -medicines that may cause a release of neutrophils, such as lithium What should I watch for while using this medicine? You may need blood work done while you are taking this medicine. What side effects may I notice from receiving  this medicine? Side effects that you should report to your doctor or health care professional as soon as possible: -allergic reactions like skin rash, itching or hives, swelling of the face, lips, or tongue -shortness of breath or breathing problems -fever -pain, redness, or irritation at site where injected -pinpoint red spots on the skin -stomach or side pain, or pain at the shoulder -swelling -tiredness -trouble passing urine Side effects that usually do not require medical attention (report to your doctor or health care professional if they continue or are bothersome): -bone pain -muscle pain Where should I keep my medicine? Keep out of the reach of children. Store in a refrigerator between 2 and 8 degrees C (36 and 46 degrees F). Keep in carton to protect from light. Throw away this medicine if it is left out of the refrigerator for more than 5 consecutive days. Throw away any unused medicine after the expiration date.  2017 Elsevier/Gold Standard (2015-07-18 12:15:25)  

## 2016-07-01 ENCOUNTER — Ambulatory Visit (HOSPITAL_BASED_OUTPATIENT_CLINIC_OR_DEPARTMENT_OTHER): Payer: BLUE CROSS/BLUE SHIELD

## 2016-07-01 VITALS — BP 113/80 | HR 100 | Temp 97.6°F | Resp 20

## 2016-07-01 DIAGNOSIS — C787 Secondary malignant neoplasm of liver and intrahepatic bile duct: Secondary | ICD-10-CM | POA: Diagnosis not present

## 2016-07-01 DIAGNOSIS — C7951 Secondary malignant neoplasm of bone: Secondary | ICD-10-CM

## 2016-07-01 DIAGNOSIS — C50912 Malignant neoplasm of unspecified site of left female breast: Secondary | ICD-10-CM

## 2016-07-01 DIAGNOSIS — C7931 Secondary malignant neoplasm of brain: Secondary | ICD-10-CM

## 2016-07-01 DIAGNOSIS — C7800 Secondary malignant neoplasm of unspecified lung: Secondary | ICD-10-CM

## 2016-07-01 DIAGNOSIS — Z5189 Encounter for other specified aftercare: Secondary | ICD-10-CM

## 2016-07-01 MED ORDER — TBO-FILGRASTIM 300 MCG/0.5ML ~~LOC~~ SOSY
300.0000 ug | PREFILLED_SYRINGE | Freq: Once | SUBCUTANEOUS | Status: AC
  Administered 2016-07-01: 300 ug via SUBCUTANEOUS
  Filled 2016-07-01: qty 0.5

## 2016-07-01 NOTE — Patient Instructions (Signed)
Tbo-Filgrastim injection What is this medicine? TBO-FILGRASTIM (T B O fil GRA stim) is a granulocyte colony-stimulating factor that stimulates the growth of neutrophils, a type of white blood cell important in the body's fight against infection. It is used to reduce the incidence of fever and infection in patients with certain types of cancer who are receiving chemotherapy that affects the bone marrow. COMMON BRAND NAME(S): Granix What should I tell my health care provider before I take this medicine? They need to know if you have any of these conditions: -ongoing radiation therapy -sickle cell anemia -an unusual or allergic reaction to tbo-filgrastim, filgrastim, pegfilgrastim, other medicines, foods, dyes, or preservatives -pregnant or trying to get pregnant -breast-feeding How should I use this medicine? This medicine is for injection under the skin. If you get this medicine at home, you will be taught how to prepare and give this medicine. Refer to the Instructions for Use that come with your medication packaging. Use exactly as directed. Take your medicine at regular intervals. Do not take your medicine more often than directed. It is important that you put your used needles and syringes in a special sharps container. Do not put them in a trash can. If you do not have a sharps container, call your pharmacist or healthcare provider to get one. Talk to your pediatrician regarding the use of this medicine in children. Special care may be needed. What if I miss a dose? It is important not to miss your dose. Call your doctor or health care professional if you miss a dose. What may interact with this medicine? This medicine may interact with the following medications: -medicines that may cause a release of neutrophils, such as lithium What should I watch for while using this medicine? You may need blood work done while you are taking this medicine. What side effects may I notice from receiving  this medicine? Side effects that you should report to your doctor or health care professional as soon as possible: -allergic reactions like skin rash, itching or hives, swelling of the face, lips, or tongue -shortness of breath or breathing problems -fever -pain, redness, or irritation at site where injected -pinpoint red spots on the skin -stomach or side pain, or pain at the shoulder -swelling -tiredness -trouble passing urine Side effects that usually do not require medical attention (report to your doctor or health care professional if they continue or are bothersome): -bone pain -muscle pain Where should I keep my medicine? Keep out of the reach of children. Store in a refrigerator between 2 and 8 degrees C (36 and 46 degrees F). Keep in carton to protect from light. Throw away this medicine if it is left out of the refrigerator for more than 5 consecutive days. Throw away any unused medicine after the expiration date.  2017 Elsevier/Gold Standard (2015-07-18 12:15:25)  

## 2016-07-08 ENCOUNTER — Ambulatory Visit (INDEPENDENT_AMBULATORY_CARE_PROVIDER_SITE_OTHER): Payer: BLUE CROSS/BLUE SHIELD | Admitting: Internal Medicine

## 2016-07-10 ENCOUNTER — Ambulatory Visit (HOSPITAL_BASED_OUTPATIENT_CLINIC_OR_DEPARTMENT_OTHER): Payer: BLUE CROSS/BLUE SHIELD | Admitting: Oncology

## 2016-07-10 ENCOUNTER — Other Ambulatory Visit (HOSPITAL_BASED_OUTPATIENT_CLINIC_OR_DEPARTMENT_OTHER): Payer: BLUE CROSS/BLUE SHIELD

## 2016-07-10 ENCOUNTER — Ambulatory Visit (HOSPITAL_BASED_OUTPATIENT_CLINIC_OR_DEPARTMENT_OTHER): Payer: BLUE CROSS/BLUE SHIELD

## 2016-07-10 ENCOUNTER — Ambulatory Visit: Payer: BLUE CROSS/BLUE SHIELD

## 2016-07-10 VITALS — BP 107/74 | HR 103 | Temp 97.3°F | Resp 20 | Ht 65.0 in | Wt 153.7 lb

## 2016-07-10 VITALS — HR 92

## 2016-07-10 DIAGNOSIS — C7951 Secondary malignant neoplasm of bone: Secondary | ICD-10-CM | POA: Diagnosis not present

## 2016-07-10 DIAGNOSIS — C787 Secondary malignant neoplasm of liver and intrahepatic bile duct: Secondary | ICD-10-CM

## 2016-07-10 DIAGNOSIS — C50212 Malignant neoplasm of upper-inner quadrant of left female breast: Secondary | ICD-10-CM

## 2016-07-10 DIAGNOSIS — C7800 Secondary malignant neoplasm of unspecified lung: Secondary | ICD-10-CM | POA: Diagnosis not present

## 2016-07-10 DIAGNOSIS — C50912 Malignant neoplasm of unspecified site of left female breast: Secondary | ICD-10-CM

## 2016-07-10 DIAGNOSIS — C7949 Secondary malignant neoplasm of other parts of nervous system: Secondary | ICD-10-CM

## 2016-07-10 DIAGNOSIS — Z170 Estrogen receptor positive status [ER+]: Secondary | ICD-10-CM

## 2016-07-10 DIAGNOSIS — Z5111 Encounter for antineoplastic chemotherapy: Secondary | ICD-10-CM

## 2016-07-10 DIAGNOSIS — C7931 Secondary malignant neoplasm of brain: Secondary | ICD-10-CM | POA: Diagnosis not present

## 2016-07-10 DIAGNOSIS — C50919 Malignant neoplasm of unspecified site of unspecified female breast: Secondary | ICD-10-CM

## 2016-07-10 DIAGNOSIS — C50922 Malignant neoplasm of unspecified site of left male breast: Secondary | ICD-10-CM

## 2016-07-10 DIAGNOSIS — C7802 Secondary malignant neoplasm of left lung: Secondary | ICD-10-CM

## 2016-07-10 LAB — CBC WITH DIFFERENTIAL/PLATELET
BASO%: 0.8 % (ref 0.0–2.0)
Basophils Absolute: 0 10*3/uL (ref 0.0–0.1)
EOS ABS: 0.1 10*3/uL (ref 0.0–0.5)
EOS%: 2.1 % (ref 0.0–7.0)
HCT: 35.5 % (ref 34.8–46.6)
HEMOGLOBIN: 11.6 g/dL (ref 11.6–15.9)
LYMPH%: 21.8 % (ref 14.0–49.7)
MCH: 34 pg (ref 25.1–34.0)
MCHC: 32.6 g/dL (ref 31.5–36.0)
MCV: 104.4 fL — AB (ref 79.5–101.0)
MONO#: 0.4 10*3/uL (ref 0.1–0.9)
MONO%: 16.5 % — AB (ref 0.0–14.0)
NEUT%: 58.8 % (ref 38.4–76.8)
NEUTROS ABS: 1.5 10*3/uL (ref 1.5–6.5)
Platelets: 201 10*3/uL (ref 145–400)
RBC: 3.4 10*6/uL — AB (ref 3.70–5.45)
RDW: 16.4 % — AB (ref 11.2–14.5)
WBC: 2.5 10*3/uL — AB (ref 3.9–10.3)
lymph#: 0.5 10*3/uL — ABNORMAL LOW (ref 0.9–3.3)

## 2016-07-10 LAB — COMPREHENSIVE METABOLIC PANEL
ALBUMIN: 3.2 g/dL — AB (ref 3.5–5.0)
ALT: 49 U/L (ref 0–55)
AST: 47 U/L — AB (ref 5–34)
Alkaline Phosphatase: 132 U/L (ref 40–150)
Anion Gap: 7 mEq/L (ref 3–11)
BUN: 6.8 mg/dL — AB (ref 7.0–26.0)
CO2: 23 meq/L (ref 22–29)
Calcium: 7.7 mg/dL — ABNORMAL LOW (ref 8.4–10.4)
Chloride: 108 mEq/L (ref 98–109)
Creatinine: 0.6 mg/dL (ref 0.6–1.1)
GLUCOSE: 128 mg/dL (ref 70–140)
Potassium: 3.8 mEq/L (ref 3.5–5.1)
SODIUM: 137 meq/L (ref 136–145)
TOTAL PROTEIN: 5.8 g/dL — AB (ref 6.4–8.3)
Total Bilirubin: 0.66 mg/dL (ref 0.20–1.20)

## 2016-07-10 MED ORDER — ACETAMINOPHEN 325 MG PO TABS
325.0000 mg | ORAL_TABLET | Freq: Once | ORAL | Status: AC
  Administered 2016-07-10: 325 mg via ORAL

## 2016-07-10 MED ORDER — HEPARIN SOD (PORK) LOCK FLUSH 100 UNIT/ML IV SOLN
500.0000 [IU] | Freq: Once | INTRAVENOUS | Status: DC | PRN
  Filled 2016-07-10: qty 5

## 2016-07-10 MED ORDER — PROCHLORPERAZINE MALEATE 10 MG PO TABS
10.0000 mg | ORAL_TABLET | Freq: Once | ORAL | Status: AC
  Administered 2016-07-10: 10 mg via ORAL

## 2016-07-10 MED ORDER — SODIUM CHLORIDE 0.9% FLUSH
10.0000 mL | INTRAVENOUS | Status: DC | PRN
  Administered 2016-07-10: 10 mL via INTRAVENOUS
  Filled 2016-07-10: qty 10

## 2016-07-10 MED ORDER — SODIUM CHLORIDE 0.9% FLUSH
10.0000 mL | INTRAVENOUS | Status: DC | PRN
  Administered 2016-07-10: 10 mL
  Filled 2016-07-10: qty 10

## 2016-07-10 MED ORDER — ALTEPLASE 2 MG IJ SOLR
2.0000 mg | Freq: Once | INTRAMUSCULAR | Status: DC | PRN
  Filled 2016-07-10: qty 2

## 2016-07-10 MED ORDER — ACETAMINOPHEN 325 MG PO TABS
ORAL_TABLET | ORAL | Status: AC
  Filled 2016-07-10: qty 1

## 2016-07-10 MED ORDER — PROCHLORPERAZINE MALEATE 10 MG PO TABS
ORAL_TABLET | ORAL | Status: AC
  Filled 2016-07-10: qty 1

## 2016-07-10 MED ORDER — SODIUM CHLORIDE 0.9 % IV SOLN
Freq: Once | INTRAVENOUS | Status: AC
  Administered 2016-07-10: 12:00:00 via INTRAVENOUS

## 2016-07-10 MED ORDER — HEPARIN SOD (PORK) LOCK FLUSH 100 UNIT/ML IV SOLN
500.0000 [IU] | Freq: Once | INTRAVENOUS | Status: AC | PRN
  Administered 2016-07-10: 500 [IU]
  Filled 2016-07-10: qty 5

## 2016-07-10 MED ORDER — SODIUM CHLORIDE 0.9 % IV SOLN
600.0000 mg/m2 | Freq: Once | INTRAVENOUS | Status: AC
  Administered 2016-07-10: 1064 mg via INTRAVENOUS
  Filled 2016-07-10: qty 27.98

## 2016-07-10 MED ORDER — OXYCODONE HCL ER 30 MG PO T12A: 30.0000 mg | EXTENDED_RELEASE_TABLET | Freq: Two times a day (BID) | ORAL | 0 refills | Status: DC

## 2016-07-10 NOTE — Progress Notes (Signed)
Jill Johnston  [phone] [phone]     ID: Jill Johnston DOB: 1966/01/10  MR#: [id number]  SFK#:[id number]  Patient Care Team: Jill Height, MD as PCP - General (Internal Medicine) Jill Pita, MD as Consulting Physician (Radiation Oncology) Jill Johnston as Consulting Physician (Internal Medicine) Jill Cruel, MD OTHER MD:  CHIEF COMPLAINT: Estrogen receptor positive metastatic breast cancer  CURRENT TREATMENT: Gemcitabine   BREAST CANCER HISTORY: From Jill. [name] Johnston's summary note 05/08/2016:  "Patient was in excellent health until diagnosed with multifocal cT2 cN2 Mx carcinoma upper inner quadrant left breast 05-17-2007. The breast cancer was ER + 100%, PR 1%, HER 2 IHC/FISH negative, Ki67 52%, BRCA 1/2 negative, initial CA 2729 WNL. She had neoadjuvant AC x4 taxol x12 dose dense from 06-10-2007 thru 09-28-2007 by Jill Johnston. 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 She had radiation to chest wall, supraclavicular region and left axilla 50.4 Gy with boost to mastectomy scar to 60.4 Gy, by Jill Jill Johnston from 12-15-2007 thru 01-31-2008. She was on tamoxifen from 02-20-2008 thru 09-25-2014. She developed pain in ribs 08-2014, with CA 2729 52 and PET CT and MRI with diffuse bone metastatic disease, no cord compression, small lung lesions and intrathoracic adenopathy. 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 She began Denosumab 120 mcg monthly starting 4-7-201, and letrozole + palbociclib beginning4-12-2014. She was seen in consultation by Jill Jill Johnston [phone]. Palbociclib dose decreased to 100 mg daily x 21 q 28 days due to neutropenia. CBC on 10-25-15: WBC 2.1, Hgb 13, plt 186, ANC 0.9 PET 09-25-2014 in [facility] and CT CAP at  [place] 10-25-15 with no clear pulmonary involvement, stable 2 cm lesion at dome of liver and 11 mm left hepatic lobe lesion. She had right tomo mammogram at [facility] 09-17-15, with heterogeneously dense breast tissue but no other mammographic findings of concern.  CA 2729 increased from 38 in 08-2015 to 49 in April 2017 and 65 in May 2017. CT CAP / PET 03-16-16 showed progression lung, liver and bone. Letrozole and ibrance DCd on 03-19-16, with plans to begin chemotherapy. 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 She elected whole brain RT, given by Jill Jill Johnston ~ 03-23-16 thru 04-10-16.  She had first gemzar on 04-24-16  She had evaluation for vaginal bleeding "from polyp", follows yearly with gynecologist in [place], up to date."  Her subsequent history is as detailed below.  INTERVAL HISTORY: Jill Johnston returns today for follow up of her metastatic breast cancer accompanied by her husband Jill Johnston. Today is day 1 cycle four of gemzar which she receives on days 1 and 8 of each 21 days cycle, with neulasta support given days 4 and 5 of each dose (she has had difficulty withneulasta in the past)  She was to have been restaged after cycle 3 but because of the holidays that was not done. We discussed that at length today. We also reviewed her CA 27-29, which showed a recent marked drop  REVIEW OF SYSTEMS: [name] is getting tired of being tired-- she feels better a few days on the week off, then feels tired again. She would like to have enough energy to play a little tennis if  possible, she says. She denies unusual h/a, N/V, or change in bowel or blader habits. Pain is well controlled on the long acting narcotic and she almost never has to use the breakthrough meds. A detailed ROS today was otherwise stable  PAST MEDICAL HISTORY: Past Medical History:  Diagnosis Date  . Anxiety   .  Breast cancer ([place])    Stage IV, ER positive left upper outer quadrant breast cancer with metastasis to bone  . Breast cancer metastasized to multiple sites ([place]) 04/12/2016   bone, liver, lung and brain  . GERD (gastroesophageal reflux disease)   . Hypertension     PAST SURGICAL HISTORY: Past Surgical History:  Procedure Laterality Date  . CHOLECYSTECTOMY    . ESOPHAGOGASTRODUODENOSCOPY (EGD) WITH PROPOFOL N/A 07/12/2015   Procedure: ESOPHAGOGASTRODUODENOSCOPY (EGD) WITH PROPOFOL;  Surgeon: Rogene Houston, MD;  Location: [facility];  Service: Endoscopy;  Laterality: N/A;  1:00  . IR GENERIC HISTORICAL  04/22/2016   IR US GUIDE VASC ACCESS RIGHT 04/22/2016 Markus Daft, MD [facility]INTER[facility] RAD  . IR GENERIC HISTORICAL  04/22/2016   IR FLUORO GUIDE PORT INSERTION RIGHT 04/22/2016 Markus Daft, MD [facility]  . MASTECTOMY  April 2009   Left breast with lymph node resection    FAMILY HISTORY Family History  Problem Relation Age of Onset  . Heart attack Mother     Died age 57  . Heart disease Father     Died age 75  . Brain cancer Father     pt not sure if he actually had this  . Cancer Maternal Grandmother     dx. NOS cancer at older age; d. 34  . Heart attack Maternal Grandfather 79  . Colon cancer Paternal Grandmother     d. 15s  The patient's father died at age 54 she believes from heart disease. The patient's mother died at age 82 also from a myocardial infarction. The patient had one brother, no sisters. There is no history of breast or ovarian cancer in the family despite extensive G neurologic research   GYNECOLOGIC HISTORY:  No LMP recorded. Patient is not currently having periods (Reason: Chemotherapy).  menarche age 15, first live birth age 42, the patient is GX P1. She stopped having periods with chemotherapy in 2009 and these have not resumed.   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 They recently moved to [place] from [place]. Their son Jill Johnston is 53 years old as of November 2017 . The patient is [place] [employer]      ADVANCED DIRECTIVES:  the patient's husband is her healthcare power of attorney. There is no living will in place    HEALTH MAINTENANCE: Social History  Substance Use Topics  . Smoking status: Never Smoker  . Smokeless tobacco: Never Used  . Alcohol use No     Colonoscopy:  PAP:  Bone density:   No Known Allergies  Current Outpatient Prescriptions  Medication Sig Dispense Refill  . docusate sodium (COLACE) 100 MG capsule Take 100 mg by mouth daily.     . fluconazole (DIFLUCAN) 100 MG tablet Take 1 tablet (100 mg total) by mouth daily as needed (for thrush). 10 tablet 0  . ibuprofen (ADVIL,MOTRIN) 200 MG tablet Take 400 mg by mouth daily as needed.    . lidocaine-prilocaine (EMLA) cream Apply 1 application topically as needed. To port 1 hour before going to be  accessed with needle. Cover with plastic wrap. 30 g 1  . lisinopril (PRINIVIL,ZESTRIL) 10 MG tablet Take 1 tablet (10 mg total) by mouth daily. 30 tablet 0  . oxyCODONE (OXYCONTIN) 30 MG 12 hr tablet Take 30 mg by mouth 2 (two) times daily. 60 each 0  . Oxycodone HCl 10 MG TABS Take 1 tablet by mouth every 3 (three) hours as needed for pain.    . pantoprazole (PROTONIX) 40 MG tablet TAKE 1 TABLET(40 MG) BY MOUTH TWICE DAILY BEFORE A MEAL 60 tablet 5  . polyethylene glycol (MIRALAX / GLYCOLAX) packet Take 17 grams by mouth every day. 14 each 6   No current facility-administered medications for this visit.    Facility-Administered Medications Ordered in Other Visits  Medication Dose Route Frequency Provider Last Rate Last Dose  . filgrastim (NEUPOGEN) injection 300 mcg  300 mcg Subcutaneous Once Jill Cruel, MD        OBJECTIVE: Middle-aged Jill woman who appears stated age  51:   07/10/16 1127  BP: 107/74  Pulse: (!) 103  Resp: 20  Temp: 97.3 F (36.3  C)     Body mass index is 25.58 kg/m.    ECOG FS:2 - Symptomatic, <50% confined to bed  Sclerae unicteric, pupils round and equal Oropharynx clear and moist-- no thrush or other lesions No cervical or supraclavicular adenopathy Lungs no rales or rhonchi Heart regular rate and rhythm Abd soft, nontender, positive bowel sounds MSK no focal spinal tenderness, no upper extremity lymphedema Neuro: nonfocal, well oriented, appropriate affect Breasts: Deferred    LAB RESULTS:  CMP     Component Value Date/Time   NA 137 07/10/2016 1051   K 3.8 07/10/2016 1051   CL 105 05/20/2016 0930   CO2 23 07/10/2016 1051   GLUCOSE 128 07/10/2016 1051   BUN 6.8 (L) 07/10/2016 1051   CREATININE 0.6 07/10/2016 1051   CALCIUM 7.7 (L) 07/10/2016 1051   PROT 5.8 (L) 07/10/2016 1051   ALBUMIN 3.2 (L) 07/10/2016 1051   AST 47 (H) 07/10/2016 1051   ALT 49 07/10/2016 1051   ALKPHOS 132 07/10/2016 1051   BILITOT 0.66 07/10/2016 1051   GFRNONAA >60 05/20/2016 0930   GFRAA >60 05/20/2016 0930    INo results found for: SPEP, UPEP  Lab Results  Component Value Date   WBC 2.5 (L) 07/10/2016   NEUTROABS 1.5 07/10/2016   HGB 11.6 07/10/2016   HCT 35.5 07/10/2016   MCV 104.4 (H) 07/10/2016   PLT 201 07/10/2016      Chemistry      Component Value Date/Time   NA 137 07/10/2016 1051   K 3.8 07/10/2016 1051   CL 105 05/20/2016 0930   CO2 23 07/10/2016 1051   BUN 6.8 (L) 07/10/2016 1051   CREATININE 0.6 07/10/2016 1051      Component Value Date/Time   CALCIUM 7.7 (L) 07/10/2016 1051   ALKPHOS 132 07/10/2016 1051   AST 47 (H) 07/10/2016 1051   ALT 49 07/10/2016 1051   BILITOT 0.66 07/10/2016 1051       Lab Results  Component Value Date   LABCA2 98 (H) 02/27/2016    No components found for: [id number]  No results for input(s): INR in the last 168 hours.  Urinalysis    Component Value Date/Time   COLORURINE ORANGE (A) 05/17/2016 0852   APPEARANCEUR CLOUDY (A) 05/17/2016 0852    LABSPEC 1.027 05/17/2016 0852   PHURINE 6.0 05/17/2016 [place] 05/17/2016 8850  HGBUR NEGATIVE 05/17/2016 0852   BILIRUBINUR SMALL (A) 05/17/2016 0852   KETONESUR NEGATIVE 05/17/2016 0852   PROTEINUR 30 (A) 05/17/2016 0852   NITRITE NEGATIVE 05/17/2016 0852   LEUKOCYTESUR TRACE (A) 05/17/2016 0852   Results for LYNSEY, ANGE (MRN [id number]) as of 07/11/2016 12:35  Ref. Range 12/27/2015 08:30 02/27/2016 10:37 04/24/2016 09:58 06/26/2016 11:17  CA 27.29 Latest Ref Range: 0.0 - 38.6 U/mL 66.0 (H) 94.0 (H) 178.8 (H) 54.3 (H)    STUDIES: No results found.  ELIGIBLE FOR AVAILABLE RESEARCH PROTOCOL: no  ASSESSMENT: 50 y.o. BRCA negative [place] woman originally from [place]  (1) status post left breast upper inner quadrant biopsy 05/17/2007 for a clinical mT2 N2, stage IIIA invasive ductal breast cancer, strongly estrogen receptor positive, weakly progesterone receptor positive, HER-2 negative, with an MIB-1 of 52%  (2) neoadjuvant chemotherapy consisted of dose dense doxorubicin and cyclophosphamide 4 followed by weekly paclitaxel 12, started 06/10/2007, completed 09/28/2007  (3) status post left modified radical mastectomy 10/27/2007 at [facility] [id number]) removed that invasive ductal carcinoma, grade 2, pT2, pN1a (downstaged to IIB), estrogen receptor strongly positive, progesterone receptor weakly positive, HER-2 not amplified by immunohistochemistry or FISH with a signals ratio of 0.94, number per cell 3.4  (4) status post adjuvant radiation to the left chest wall as well as the left supraclavicular and axillary nodal regions (50.4 gray +10 Gray boost) given between 12/15/2007 on 01/31/2008  (5) on adjuvant tamoxifen 02/20/2008 through March 2016  METASTATIC DISEASE: March 2016, presenting with bone pain (6) staging studies April 2016 showed diffuse bony metastatic disease, intrathoracic adenopathy, and small lung lesions.  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  (10) repeat genetic testing 04/23/2016 through the [facility] Hereditary Cancer Panel offered by [facility] found no deleterious mutations in APC, ATM, BARD1, BMPR1A, BRCA1, BRCA2, BRIP1, CHD1, CDK4, CDKN2A, CHEK2, EPCAM (large rearrangement only), GREM1/SCG5, MLH1, MSH2, MSH6, MUTYH, NBN, PALB2, PMS2, POLD1, POLE, PTEN, RAD51C, RAD51D, SMAD4, STK11, and TP53  (11) gemcitabine started 04/24/2016, to be given days 1 and 8 of each 21 day cycle  (a) day 8 cycle 1 delayed because of cytopenias--dose reduced and neupogen added   PLAN: I spent approximately 30 minutes with [facility] and her husband Jill Johnston reviewing her situation. It is encouraging that the tumor marker has gone down. We were originally going to restage her after 6 doses. This will be her seventh dose and I think it would be a good moved to restage her next week before she has her eighth dose. That will let us know if we are in fact getting somewhere as we seem to be.  We also need to repeat her brain MRI. If there has been progression in the brain and then we can stop the chemotherapy peripherally and she would have to be referred again to the brain tumor group  Elen is getting tired of not feeling well. She would like to have more time between treatments so she could have more energy, be able to play tennis, walk  her dog, and just be more normal. We will  discuss that next week when she returns to see me. There is always a trade-off between controlling the tumor and having to live with all the symptoms due to not only the tumor but the treatment itself.  Alternatively we could consider going on exemestane/everolimus, which may or may not be easier for her.  She knows to call for any problems that may develop before the next visit here.      Jill Cruel, MD   07/11/2016 12:34 PM Medical Oncology and Hematology [facility] [address] [place], [place] [address] Tel. [phone]    Fax. [phone]

## 2016-07-10 NOTE — Patient Instructions (Signed)
[facility] Discharge Instructions for Patients Receiving Chemotherapy  Today you received the following chemotherapy agents Gemzar  To help prevent nausea and vomiting after your treatment, we encourage you to take your nausea medication as directed.    If you develop nausea and vomiting that is not controlled by your nausea medication, call the clinic.   BELOW ARE SYMPTOMS THAT SHOULD BE REPORTED IMMEDIATELY:  *FEVER GREATER THAN 100.5 F  *CHILLS WITH OR WITHOUT FEVER  NAUSEA AND VOMITING THAT IS NOT CONTROLLED WITH YOUR NAUSEA MEDICATION  *UNUSUAL SHORTNESS OF BREATH  *UNUSUAL BRUISING OR BLEEDING  TENDERNESS IN MOUTH AND THROAT WITH OR WITHOUT PRESENCE OF ULCERS  *URINARY PROBLEMS  *BOWEL PROBLEMS  UNUSUAL RASH Items with * indicate a potential emergency and should be followed up as soon as possible.  Feel free to call the clinic you have any questions or concerns. The clinic phone number is [phone].  Please show the CHEMO ALERT CARD at check-in to the Emergency Department and triage nurse.   

## 2016-07-13 ENCOUNTER — Ambulatory Visit (HOSPITAL_BASED_OUTPATIENT_CLINIC_OR_DEPARTMENT_OTHER): Payer: BLUE CROSS/BLUE SHIELD

## 2016-07-13 VITALS — BP 112/78 | HR 100 | Temp 97.0°F | Resp 18

## 2016-07-13 DIAGNOSIS — C7951 Secondary malignant neoplasm of bone: Secondary | ICD-10-CM | POA: Diagnosis not present

## 2016-07-13 DIAGNOSIS — C50212 Malignant neoplasm of upper-inner quadrant of left female breast: Secondary | ICD-10-CM | POA: Diagnosis not present

## 2016-07-13 DIAGNOSIS — C7931 Secondary malignant neoplasm of brain: Secondary | ICD-10-CM | POA: Diagnosis not present

## 2016-07-13 DIAGNOSIS — C50912 Malignant neoplasm of unspecified site of left female breast: Secondary | ICD-10-CM

## 2016-07-13 DIAGNOSIS — C7800 Secondary malignant neoplasm of unspecified lung: Secondary | ICD-10-CM | POA: Diagnosis not present

## 2016-07-13 DIAGNOSIS — C787 Secondary malignant neoplasm of liver and intrahepatic bile duct: Secondary | ICD-10-CM

## 2016-07-13 DIAGNOSIS — Z5189 Encounter for other specified aftercare: Secondary | ICD-10-CM

## 2016-07-13 MED ORDER — TBO-FILGRASTIM 300 MCG/0.5ML ~~LOC~~ SOSY
300.0000 ug | PREFILLED_SYRINGE | Freq: Once | SUBCUTANEOUS | Status: AC
  Administered 2016-07-13: 300 ug via SUBCUTANEOUS
  Filled 2016-07-13: qty 0.5

## 2016-07-13 NOTE — Patient Instructions (Signed)
Tbo-Filgrastim injection What is this medicine? TBO-FILGRASTIM (T B O fil GRA stim) is a granulocyte colony-stimulating factor that stimulates the growth of neutrophils, a type of white blood cell important in the body's fight against infection. It is used to reduce the incidence of fever and infection in patients with certain types of cancer who are receiving chemotherapy that affects the bone marrow. COMMON BRAND NAME(S): Granix What should I tell my health care provider before I take this medicine? They need to know if you have any of these conditions: -ongoing radiation therapy -sickle cell anemia -an unusual or allergic reaction to tbo-filgrastim, filgrastim, pegfilgrastim, other medicines, foods, dyes, or preservatives -pregnant or trying to get pregnant -breast-feeding How should I use this medicine? This medicine is for injection under the skin. If you get this medicine at home, you will be taught how to prepare and give this medicine. Refer to the Instructions for Use that come with your medication packaging. Use exactly as directed. Take your medicine at regular intervals. Do not take your medicine more often than directed. It is important that you put your used needles and syringes in a special sharps container. Do not put them in a trash can. If you do not have a sharps container, call your pharmacist or healthcare provider to get one. Talk to your pediatrician regarding the use of this medicine in children. Special care may be needed. What if I miss a dose? It is important not to miss your dose. Call your doctor or health care professional if you miss a dose. What may interact with this medicine? This medicine may interact with the following medications: -medicines that may cause a release of neutrophils, such as lithium What should I watch for while using this medicine? You may need blood work done while you are taking this medicine. What side effects may I notice from receiving  this medicine? Side effects that you should report to your doctor or health care professional as soon as possible: -allergic reactions like skin rash, itching or hives, swelling of the face, lips, or tongue -shortness of breath or breathing problems -fever -pain, redness, or irritation at site where injected -pinpoint red spots on the skin -stomach or side pain, or pain at the shoulder -swelling -tiredness -trouble passing urine Side effects that usually do not require medical attention (report to your doctor or health care professional if they continue or are bothersome): -bone pain -muscle pain Where should I keep my medicine? Keep out of the reach of children. Store in a refrigerator between 2 and 8 degrees C (36 and 46 degrees F). Keep in carton to protect from light. Throw away this medicine if it is left out of the refrigerator for more than 5 consecutive days. Throw away any unused medicine after the expiration date.  2017 Elsevier/Gold Standard (2015-07-18 12:15:25)  

## 2016-07-14 ENCOUNTER — Ambulatory Visit (HOSPITAL_BASED_OUTPATIENT_CLINIC_OR_DEPARTMENT_OTHER): Payer: BLUE CROSS/BLUE SHIELD

## 2016-07-14 VITALS — BP 110/77 | HR 99 | Temp 97.8°F | Resp 20

## 2016-07-14 DIAGNOSIS — C7800 Secondary malignant neoplasm of unspecified lung: Secondary | ICD-10-CM | POA: Diagnosis not present

## 2016-07-14 DIAGNOSIS — C7951 Secondary malignant neoplasm of bone: Secondary | ICD-10-CM

## 2016-07-14 DIAGNOSIS — C787 Secondary malignant neoplasm of liver and intrahepatic bile duct: Secondary | ICD-10-CM

## 2016-07-14 DIAGNOSIS — C7931 Secondary malignant neoplasm of brain: Secondary | ICD-10-CM | POA: Diagnosis not present

## 2016-07-14 DIAGNOSIS — C50912 Malignant neoplasm of unspecified site of left female breast: Secondary | ICD-10-CM

## 2016-07-14 DIAGNOSIS — Z5189 Encounter for other specified aftercare: Secondary | ICD-10-CM

## 2016-07-14 MED ORDER — TBO-FILGRASTIM 300 MCG/0.5ML ~~LOC~~ SOSY
300.0000 ug | PREFILLED_SYRINGE | Freq: Once | SUBCUTANEOUS | Status: AC
  Administered 2016-07-14: 300 ug via SUBCUTANEOUS
  Filled 2016-07-14: qty 0.5

## 2016-07-14 NOTE — Patient Instructions (Signed)
Tbo-Filgrastim injection What is this medicine? TBO-FILGRASTIM (T B O fil GRA stim) is a granulocyte colony-stimulating factor that stimulates the growth of neutrophils, a type of white blood cell important in the body's fight against infection. It is used to reduce the incidence of fever and infection in patients with certain types of cancer who are receiving chemotherapy that affects the bone marrow. COMMON BRAND NAME(S): Granix What should I tell my health care provider before I take this medicine? They need to know if you have any of these conditions: -ongoing radiation therapy -sickle cell anemia -an unusual or allergic reaction to tbo-filgrastim, filgrastim, pegfilgrastim, other medicines, foods, dyes, or preservatives -pregnant or trying to get pregnant -breast-feeding How should I use this medicine? This medicine is for injection under the skin. If you get this medicine at home, you will be taught how to prepare and give this medicine. Refer to the Instructions for Use that come with your medication packaging. Use exactly as directed. Take your medicine at regular intervals. Do not take your medicine more often than directed. It is important that you put your used needles and syringes in a special sharps container. Do not put them in a trash can. If you do not have a sharps container, call your pharmacist or healthcare provider to get one. Talk to your pediatrician regarding the use of this medicine in children. Special care may be needed. What if I miss a dose? It is important not to miss your dose. Call your doctor or health care professional if you miss a dose. What may interact with this medicine? This medicine may interact with the following medications: -medicines that may cause a release of neutrophils, such as lithium What should I watch for while using this medicine? You may need blood work done while you are taking this medicine. What side effects may I notice from receiving  this medicine? Side effects that you should report to your doctor or health care professional as soon as possible: -allergic reactions like skin rash, itching or hives, swelling of the face, lips, or tongue -shortness of breath or breathing problems -fever -pain, redness, or irritation at site where injected -pinpoint red spots on the skin -stomach or side pain, or pain at the shoulder -swelling -tiredness -trouble passing urine Side effects that usually do not require medical attention (report to your doctor or health care professional if they continue or are bothersome): -bone pain -muscle pain Where should I keep my medicine? Keep out of the reach of children. Store in a refrigerator between 2 and 8 degrees C (36 and 46 degrees F). Keep in carton to protect from light. Throw away this medicine if it is left out of the refrigerator for more than 5 consecutive days. Throw away any unused medicine after the expiration date.  2017 Elsevier/Gold Standard (2015-07-18 12:15:25)  

## 2016-07-16 ENCOUNTER — Ambulatory Visit (HOSPITAL_COMMUNITY)
Admission: RE | Admit: 2016-07-16 | Discharge: 2016-07-16 | Disposition: A | Discharge status: Home / Self Care | Payer: BLUE CROSS/BLUE SHIELD | Source: Ambulatory Visit | Attending: Oncology | Admitting: Oncology

## 2016-07-16 ENCOUNTER — Other Ambulatory Visit: Payer: Self-pay | Admitting: Radiation Therapy

## 2016-07-16 ENCOUNTER — Encounter (HOSPITAL_COMMUNITY): Payer: Self-pay

## 2016-07-16 DIAGNOSIS — C787 Secondary malignant neoplasm of liver and intrahepatic bile duct: Secondary | ICD-10-CM

## 2016-07-16 DIAGNOSIS — Z923 Personal history of irradiation: Secondary | ICD-10-CM | POA: Insufficient documentation

## 2016-07-16 DIAGNOSIS — C50912 Malignant neoplasm of unspecified site of left female breast: Secondary | ICD-10-CM

## 2016-07-16 DIAGNOSIS — Z08 Encounter for follow-up examination after completed treatment for malignant neoplasm: Secondary | ICD-10-CM | POA: Insufficient documentation

## 2016-07-16 DIAGNOSIS — C7802 Secondary malignant neoplasm of left lung: Secondary | ICD-10-CM

## 2016-07-16 DIAGNOSIS — C7949 Secondary malignant neoplasm of other parts of nervous system: Secondary | ICD-10-CM

## 2016-07-16 DIAGNOSIS — I700 Atherosclerosis of aorta: Secondary | ICD-10-CM | POA: Diagnosis not present

## 2016-07-16 DIAGNOSIS — C7951 Secondary malignant neoplasm of bone: Secondary | ICD-10-CM | POA: Diagnosis not present

## 2016-07-16 DIAGNOSIS — Z170 Estrogen receptor positive status [ER+]: Principal | ICD-10-CM

## 2016-07-16 DIAGNOSIS — Z853 Personal history of malignant neoplasm of breast: Secondary | ICD-10-CM | POA: Insufficient documentation

## 2016-07-16 DIAGNOSIS — R911 Solitary pulmonary nodule: Secondary | ICD-10-CM | POA: Insufficient documentation

## 2016-07-16 DIAGNOSIS — C7931 Secondary malignant neoplasm of brain: Secondary | ICD-10-CM

## 2016-07-16 MED ORDER — GADOBENATE DIMEGLUMINE 529 MG/ML IV SOLN: 15.0000 mL | Freq: Once | INTRAVENOUS | Status: DC | PRN

## 2016-07-16 MED ORDER — HEPARIN SOD (PORK) LOCK FLUSH 100 UNIT/ML IV SOLN
INTRAVENOUS | Status: AC
  Filled 2016-07-16: qty 5

## 2016-07-16 MED ORDER — IOPAMIDOL (ISOVUE-300) INJECTION 61%
INTRAVENOUS | Status: AC
  Administered 2016-07-16: 75 mL
  Filled 2016-07-16: qty 75

## 2016-07-17 ENCOUNTER — Ambulatory Visit (HOSPITAL_BASED_OUTPATIENT_CLINIC_OR_DEPARTMENT_OTHER): Payer: BLUE CROSS/BLUE SHIELD

## 2016-07-17 ENCOUNTER — Ambulatory Visit: Payer: BLUE CROSS/BLUE SHIELD

## 2016-07-17 ENCOUNTER — Ambulatory Visit (HOSPITAL_BASED_OUTPATIENT_CLINIC_OR_DEPARTMENT_OTHER): Payer: BLUE CROSS/BLUE SHIELD | Admitting: Oncology

## 2016-07-17 ENCOUNTER — Other Ambulatory Visit (HOSPITAL_BASED_OUTPATIENT_CLINIC_OR_DEPARTMENT_OTHER): Payer: BLUE CROSS/BLUE SHIELD

## 2016-07-17 ENCOUNTER — Other Ambulatory Visit: Payer: BLUE CROSS/BLUE SHIELD

## 2016-07-17 VITALS — BP 110/79 | HR 86 | Temp 97.5°F | Resp 18 | Ht 65.0 in | Wt 154.1 lb

## 2016-07-17 DIAGNOSIS — Z5111 Encounter for antineoplastic chemotherapy: Secondary | ICD-10-CM

## 2016-07-17 DIAGNOSIS — C7951 Secondary malignant neoplasm of bone: Secondary | ICD-10-CM

## 2016-07-17 DIAGNOSIS — C50912 Malignant neoplasm of unspecified site of left female breast: Secondary | ICD-10-CM

## 2016-07-17 DIAGNOSIS — C50212 Malignant neoplasm of upper-inner quadrant of left female breast: Secondary | ICD-10-CM

## 2016-07-17 DIAGNOSIS — C7931 Secondary malignant neoplasm of brain: Secondary | ICD-10-CM | POA: Diagnosis not present

## 2016-07-17 DIAGNOSIS — C50812 Malignant neoplasm of overlapping sites of left female breast: Secondary | ICD-10-CM

## 2016-07-17 DIAGNOSIS — C787 Secondary malignant neoplasm of liver and intrahepatic bile duct: Secondary | ICD-10-CM

## 2016-07-17 DIAGNOSIS — C7800 Secondary malignant neoplasm of unspecified lung: Secondary | ICD-10-CM | POA: Diagnosis not present

## 2016-07-17 DIAGNOSIS — C7802 Secondary malignant neoplasm of left lung: Secondary | ICD-10-CM

## 2016-07-17 DIAGNOSIS — C50922 Malignant neoplasm of unspecified site of left male breast: Secondary | ICD-10-CM

## 2016-07-17 DIAGNOSIS — C50919 Malignant neoplasm of unspecified site of unspecified female breast: Secondary | ICD-10-CM

## 2016-07-17 DIAGNOSIS — Z170 Estrogen receptor positive status [ER+]: Secondary | ICD-10-CM

## 2016-07-17 DIAGNOSIS — C7949 Secondary malignant neoplasm of other parts of nervous system: Secondary | ICD-10-CM

## 2016-07-17 LAB — CBC WITH DIFFERENTIAL/PLATELET
BASO%: 1.3 % (ref 0.0–2.0)
BASOS ABS: 0.1 10*3/uL (ref 0.0–0.1)
EOS%: 0.6 % (ref 0.0–7.0)
Eosinophils Absolute: 0 10*3/uL (ref 0.0–0.5)
HEMATOCRIT: 34.7 % — AB (ref 34.8–46.6)
HEMOGLOBIN: 11 g/dL — AB (ref 11.6–15.9)
LYMPH#: 0.8 10*3/uL — AB (ref 0.9–3.3)
LYMPH%: 14.5 % (ref 14.0–49.7)
MCH: 33.1 pg (ref 25.1–34.0)
MCHC: 31.7 g/dL (ref 31.5–36.0)
MCV: 104.5 fL — ABNORMAL HIGH (ref 79.5–101.0)
MONO#: 0.9 10*3/uL (ref 0.1–0.9)
MONO%: 16.9 % — ABNORMAL HIGH (ref 0.0–14.0)
NEUT#: 3.6 10*3/uL (ref 1.5–6.5)
NEUT%: 66.7 % (ref 38.4–76.8)
Platelets: 115 10*3/uL — ABNORMAL LOW (ref 145–400)
RBC: 3.32 10*6/uL — ABNORMAL LOW (ref 3.70–5.45)
RDW: 14.9 % — ABNORMAL HIGH (ref 11.2–14.5)
WBC: 5.4 10*3/uL (ref 3.9–10.3)
nRBC: 1 % — ABNORMAL HIGH (ref 0–0)

## 2016-07-17 LAB — COMPREHENSIVE METABOLIC PANEL
ALBUMIN: 3.1 g/dL — AB (ref 3.5–5.0)
ALK PHOS: 143 U/L (ref 40–150)
ALT: 61 U/L — ABNORMAL HIGH (ref 0–55)
ANION GAP: 9 meq/L (ref 3–11)
AST: 48 U/L — AB (ref 5–34)
BUN: 5.5 mg/dL — AB (ref 7.0–26.0)
CALCIUM: 8.2 mg/dL — AB (ref 8.4–10.4)
CHLORIDE: 107 meq/L (ref 98–109)
CO2: 24 mEq/L (ref 22–29)
Creatinine: 0.6 mg/dL (ref 0.6–1.1)
EGFR: >90 mL/min/{1.73_m2} (ref >90)
Glucose: 97 mg/dl (ref 70–140)
POTASSIUM: 3.4 meq/L — AB (ref 3.5–5.1)
Sodium: 140 mEq/L (ref 136–145)
Total Bilirubin: 0.38 mg/dL (ref 0.20–1.20)
Total Protein: 6 g/dL — ABNORMAL LOW (ref 6.4–8.3)

## 2016-07-17 MED ORDER — GEMCITABINE HCL CHEMO INJECTION 1 GM/26.3ML
600.0000 mg/m2 | Freq: Once | INTRAVENOUS | Status: AC
  Administered 2016-07-17: 1064 mg via INTRAVENOUS
  Filled 2016-07-17: qty 27.98

## 2016-07-17 MED ORDER — SODIUM CHLORIDE 0.9 % IV SOLN
Freq: Once | INTRAVENOUS | Status: AC
  Administered 2016-07-17: 11:00:00 via INTRAVENOUS

## 2016-07-17 MED ORDER — PROCHLORPERAZINE MALEATE 10 MG PO TABS
ORAL_TABLET | ORAL | Status: AC
Start: 2016-07-17 — End: 2016-07-17
  Filled 2016-07-17: qty 1

## 2016-07-17 MED ORDER — PROCHLORPERAZINE MALEATE 10 MG PO TABS
10.0000 mg | ORAL_TABLET | Freq: Once | ORAL | Status: AC
  Administered 2016-07-17: 10 mg via ORAL

## 2016-07-17 MED ORDER — ACETAMINOPHEN 325 MG PO TABS
325.0000 mg | ORAL_TABLET | Freq: Once | ORAL | Status: AC
  Administered 2016-07-17: 325 mg via ORAL

## 2016-07-17 MED ORDER — GEMCITABINE HCL CHEMO INJECTION 1 GM/26.3ML: 600.0000 mg/m2 | Freq: Once | INTRAVENOUS | Status: DC

## 2016-07-17 MED ORDER — SODIUM CHLORIDE 0.9% FLUSH
10.0000 mL | INTRAVENOUS | Status: DC | PRN
  Administered 2016-07-17: 10 mL via INTRAVENOUS
  Filled 2016-07-17: qty 10

## 2016-07-17 MED ORDER — DENOSUMAB 120 MG/1.7ML ~~LOC~~ SOLN
120.0000 mg | Freq: Once | SUBCUTANEOUS | Status: AC
  Administered 2016-07-17: 120 mg via SUBCUTANEOUS
  Filled 2016-07-17: qty 1.7

## 2016-07-17 MED ORDER — SODIUM CHLORIDE 0.9% FLUSH
10.0000 mL | INTRAVENOUS | Status: DC | PRN
  Administered 2016-07-17: 10 mL
  Filled 2016-07-17: qty 10

## 2016-07-17 MED ORDER — HEPARIN SOD (PORK) LOCK FLUSH 100 UNIT/ML IV SOLN
500.0000 [IU] | Freq: Once | INTRAVENOUS | Status: AC | PRN
  Administered 2016-07-17: 500 [IU]
  Filled 2016-07-17: qty 5

## 2016-07-17 MED ORDER — ACETAMINOPHEN 325 MG PO TABS
ORAL_TABLET | ORAL | Status: AC
  Filled 2016-07-17: qty 1

## 2016-07-17 NOTE — Progress Notes (Signed)
[place]  [phone] [phone]     ID: Jill Johnston DOB: 25-Jan-1966  MR#: [id number]  FUX#:[id number]  Patient Care Team: [name], MD as PCP - General (Oncology) [name], MD as Consulting Physician (Radiation Oncology) [name] as Consulting Physician (Internal Medicine) [name], MD OTHER MD:  CHIEF COMPLAINT: Estrogen receptor positive metastatic breast cancer  CURRENT TREATMENT: Gemcitabine, denosumab/Xgeva   BREAST CANCER HISTORY: From Dr. 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 The breast cancer was ER + 100%, PR 1%, HER 2 IHC/FISH negative, Ki67 52%, BRCA 1/2 negative, initial CA 2729 WNL. She had neoadjuvant AC x4 taxol x12 dose dense from 06-10-2007 thru 09-28-2007 by Dr B.Darovsky. 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 She had radiation to chest wall, supraclavicular region and left axilla 50.4 Gy with boost to mastectomy scar to 60.4 Gy, by Dr [name] from 12-15-2007 thru 01-31-2008. She was on tamoxifen from 02-20-2008 thru 09-25-2014. She developed pain in ribs 08-2014, with CA 2729 52 and PET CT and MRI with diffuse bone metastatic disease, no cord compression, small lung lesions and intrathoracic adenopathy. 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 She began Denosumab 120 mcg monthly starting 4-7-201, and letrozole + palbociclib beginning4-12-2014. She was seen in consultation by Dr [name] [phone]. Palbociclib dose decreased to 100 mg daily x 21 q 28 days due to neutropenia. CBC on 10-25-15: WBC 2.1, Hgb 13, plt 186, ANC 0.9 PET 09-25-2014 in [facility] system and CT CAP  at [place] 10-25-15 with no clear pulmonary involvement, stable 2 cm lesion at dome of liver and 11 mm left hepatic lobe lesion. She had right tomo mammogram at [facility] 09-17-15, with heterogeneously dense breast tissue but no other mammographic findings of concern.  CA 2729 increased from 38 in 08-2015 to 49 in April 2017 and 65 in May 2017. CT CAP / PET 03-16-16 showed progression lung, liver and bone. Letrozole and ibrance DCd on 03-19-16, with plans to begin chemotherapy. 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 She elected whole brain RT, given by Dr [name] ~ 03-23-16 thru 04-10-16.  She had first gemzar on 04-24-16  She had evaluation for vaginal bleeding "from polyp", follows yearly with gynecologist in [place], up to date."  Her subsequent history is as detailed below.  INTERVAL HISTORY: Jill Johnston returns today for follow up of her stage IV estrogen receptor positive breast cancer accompanied by her husband Jill Johnston. Today is day 8 cycle four of gemzar which she receives on days 1 and 8 of each 21 days cycle, with neulasta support given days 4 and 5 of each Gemzar dose (she has had difficulty with neulasta in the past)  Since her last visit here she had a brain MRI and CT of the chest and CA-27-29. The CA (39 has significantly decreased. The CT of the chest shows increased bone activity but clear decrease in the measurable disease in the liver. The brain MRI is difficult to correlate with the prior, which was noncontrast, but it appears to show stable disease. This will be further reviewed Monday  by the brain tumor team  REVIEW OF SYSTEMS: [place] is still frustrated by the fact that she can't do anything, sleeps all day, was just a lot of [place] TV, and generally "doesn't have a life". She doesn't have significant problems with nausea or vomiting. She denies headaches, visual  changes, dizziness, gait imbalance, or falls. She denies cough or phlegm production. She denies loss of appetite although she has some taste alteration. There have been no intercurrent fevers, rash or bleeding. A detailed review of systems today was otherwise stable area  PAST MEDICAL HISTORY: Past Medical History:  Diagnosis Date  . Anxiety   . Breast cancer ([place])    Stage IV, ER positive left upper outer quadrant breast cancer with metastasis to bone  . Breast cancer metastasized to multiple sites ([place]) 04/12/2016   bone, liver, lung and brain  . GERD (gastroesophageal reflux disease)   . Hypertension     PAST SURGICAL HISTORY: Past Surgical History:  Procedure Laterality Date  . CHOLECYSTECTOMY    . ESOPHAGOGASTRODUODENOSCOPY (EGD) WITH PROPOFOL N/A 07/12/2015   Procedure: ESOPHAGOGASTRODUODENOSCOPY (EGD) WITH PROPOFOL;  Surgeon: Rogene Houston, MD;  Location: [facility];  Service: Endoscopy;  Laterality: N/A;  1:00  . IR GENERIC HISTORICAL  04/22/2016   IR US GUIDE VASC ACCESS RIGHT 04/22/2016 Markus Daft, MD [facility]INTER[facility] RAD  . IR GENERIC HISTORICAL  04/22/2016   IR FLUORO GUIDE PORT INSERTION RIGHT 04/22/2016 Markus Daft, MD [facility]  . MASTECTOMY  April 2009   Left breast with lymph node resection    FAMILY HISTORY Family History  Problem Relation Age of Onset  . Heart attack Mother     Died age 64  . Heart disease Father     Died age 41  . Brain cancer Father     pt not sure if he actually had this  . Cancer Maternal Grandmother     dx. NOS cancer at older age; d. 15  . Heart attack Maternal Grandfather 79  . Colon cancer Paternal Grandmother     d. 88s  The patient's father died at age 21 she believes from heart disease. The patient's mother died at age 26 also from a myocardial infarction. The patient had one brother, no sisters. There is no history of breast or ovarian cancer in the family despite extensive G neurologic research   GYNECOLOGIC HISTORY:  No LMP  recorded. Patient is not currently having periods (Reason: Chemotherapy).  menarche age 56, first live birth age 37, the patient is GX P1. She stopped having periods with chemotherapy in 2009 and these have not resumed.   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 They recently moved to [place] from [place]. Their son [name] is 25 years old as of November 2017 . The patient is [place] [employer]      ADVANCED DIRECTIVES:  the patient's husband is her healthcare power of attorney. There is no living will in place    HEALTH MAINTENANCE: Social History  Substance Use Topics  . Smoking status: Never Smoker  . Smokeless tobacco: Never Used  . Alcohol use No     Colonoscopy:  PAP:  Bone density:   No Known Allergies  Current Outpatient Prescriptions  Medication Sig Dispense Refill  . docusate sodium (COLACE) 100 MG capsule Take 100 mg by mouth daily.     . fluconazole (DIFLUCAN) 100 MG tablet Take  1 tablet (100 mg total) by mouth daily as needed (for thrush). 10 tablet 0  . ibuprofen (ADVIL,MOTRIN) 200 MG tablet Take 400 mg by mouth daily as needed.    . lidocaine-prilocaine (EMLA) cream Apply 1 application topically as needed. To port 1 hour before going to be accessed with needle. Cover with plastic wrap. 30 g 1  . lisinopril (PRINIVIL,ZESTRIL) 10 MG tablet Take 1 tablet (10 mg total) by mouth daily. 30 tablet 0  . oxyCODONE (OXYCONTIN) 30 MG 12 hr tablet Take 30 mg by mouth 2 (two) times daily. 60 each 0  . Oxycodone HCl 10 MG TABS Take 1 tablet by mouth every 3 (three) hours as needed for pain.    . pantoprazole (PROTONIX) 40 MG tablet TAKE 1 TABLET(40 MG) BY MOUTH TWICE DAILY BEFORE A MEAL 60 tablet 5  . polyethylene glycol (MIRALAX / GLYCOLAX) packet Take 17 grams by mouth every day. 14 each 6   No current facility-administered medications for this visit.     OBJECTIVE:  Middle-aged white woman In no acute distress  Vitals:   07/17/16 1009  BP: 110/79  Pulse: 86  Resp: 18  Temp: 97.5 F (36.4 C)     Body mass index is 25.64 kg/m.    ECOG FS:2 - Symptomatic, <50% confined to bed  Sclerae unicteric, EOMs intact Oropharynx clear and moist No cervical or supraclavicular adenopathy Lungs no rales or rhonchi Heart regular rate and rhythm Abd soft, nontender, positive bowel sounds MSK no focal spinal tenderness, no upper extremity lymphedema Neuro: nonfocal, well oriented, appropriate affect Breasts: Deferred   LAB RESULTS:  CMP     Component Value Date/Time   NA 137 07/10/2016 1051   K 3.8 07/10/2016 1051   CL 105 05/20/2016 0930   CO2 23 07/10/2016 1051   GLUCOSE 128 07/10/2016 1051   BUN 6.8 (L) 07/10/2016 1051   CREATININE 0.6 07/10/2016 1051   CALCIUM 7.7 (L) 07/10/2016 1051   PROT 5.8 (L) 07/10/2016 1051   ALBUMIN 3.2 (L) 07/10/2016 1051   AST 47 (H) 07/10/2016 1051   ALT 49 07/10/2016 1051   ALKPHOS 132 07/10/2016 1051   BILITOT 0.66 07/10/2016 1051   GFRNONAA >60 05/20/2016 0930   GFRAA >60 05/20/2016 0930    INo results found for: SPEP, UPEP  Lab Results  Component Value Date   WBC 5.4 07/17/2016   NEUTROABS 3.6 07/17/2016   HGB 11.0 (L) 07/17/2016   HCT 34.7 (L) 07/17/2016   MCV 104.5 (H) 07/17/2016   PLT 115 (L) 07/17/2016      Chemistry      Component Value Date/Time   NA 137 07/10/2016 1051   K 3.8 07/10/2016 1051   CL 105 05/20/2016 0930   CO2 23 07/10/2016 1051   BUN 6.8 (L) 07/10/2016 1051   CREATININE 0.6 07/10/2016 1051      Component Value Date/Time   CALCIUM 7.7 (L) 07/10/2016 1051   ALKPHOS 132 07/10/2016 1051   AST 47 (H) 07/10/2016 1051   ALT 49 07/10/2016 1051   BILITOT 0.66 07/10/2016 1051       Lab Results  Component Value Date   LABCA2 98 (H) 02/27/2016    No components found for: [id number]  No results for input(s): INR in the last 168 hours.  Urinalysis    Component Value  Date/Time   COLORURINE ORANGE (A) 05/17/2016 0852   APPEARANCEUR CLOUDY (A) 05/17/2016 0852   LABSPEC 1.027 05/17/2016 0852   PHURINE 6.0 05/17/2016 5631  GLUCOSEU NEGATIVE 05/17/2016 0852   HGBUR NEGATIVE 05/17/2016 0852   BILIRUBINUR SMALL (A) 05/17/2016 0852   KETONESUR NEGATIVE 05/17/2016 0852   PROTEINUR 30 (A) 05/17/2016 0852   NITRITE NEGATIVE 05/17/2016 0852   LEUKOCYTESUR TRACE (A) 05/17/2016 0852   Results for LYNNOX, GIRTEN (MRN [id number]) as of 07/11/2016 12:35  Ref. Range 12/27/2015 08:30 02/27/2016 10:37 04/24/2016 09:58 06/26/2016 11:17  CA 27.29 Latest Ref Range: 0.0 - 38.6 U/mL 66.0 (H) 94.0 (H) 178.8 (H) 54.3 (H)    STUDIES: Ct Chest W Contrast  Result Date: 07/16/2016 CLINICAL DATA:  History of breast cancer.  Followup. EXAM: CT CHEST WITH CONTRAST TECHNIQUE: Multidetector CT imaging of the chest was performed during intravenous contrast administration. CONTRAST:  61m ISOVUE-300 IOPAMIDOL (ISOVUE-300) INJECTION 61% COMPARISON:  05/04/2016 FINDINGS: Cardiovascular: The heart size appears within normal limits. There is no pericardial effusion. Aortic atherosclerosis is noted. Mediastinum/Nodes: The trachea appears patent and is midline. Normal appearance of the esophagus. No mediastinal or hilar adenopathy. No axillary adenopathy. Lungs/Pleura: No pleural fluid. Chronic subsegmental consolidation/ atelectasis of the right middle lobe is identified. Mild irregular and basilar predominant septal thickening is again noted, stable from previous exam. 5 mm posterior right upper lobe lung nodule is unchanged from previous exam, image 27 of series 5. Upper Abdomen: Anterior right lobe of liver metastasis measures 2.6 cm, image 94 of series 2. Previously 3.4 cm. Posterior right lobe of liver metastases measures 3.5 cm 3.4 cm, image 162 of series 7. Previously 4.6 cm. No acute abnormality noted within the upper abdomen. Musculoskeletal: Multifocal mixed lytic and sclerotic bone  metastases are again identified within the thoracic spine and bilateral ribs. Compared with previous exam there has been interval progression of disease within the spine. IMPRESSION: 1. Increase in mixed lytic and sclerotic bone metastasis within the thoracic spine. 2. Decrease in size of liver metastases. 3. Stable appearance of irregular septal thickening which appears lower lobe predominant and may reflect lymphangitic spread of tumor. Small right apical nodule is unchanged from previous exam. Electronically Signed   By: TKerby MoorsM.D.   On: 07/16/2016 15:56   Mr BJeri CosWVPContrast  Result Date: 07/16/2016 CLINICAL DATA:  Restaging metastatic breast cancer, evaluate for CNS recurrence. Known calvarial and leptomeningeal metastasis. Prior radiation treatment. EXAM: MRI HEAD WITHOUT AND WITH CONTRAST TECHNIQUE: Multiplanar, multiecho pulse sequences of the brain and surrounding structures were obtained without and with intravenous contrast. CONTRAST:  14 cc MultiHance COMPARISON:  MRI head April 15, 2016 FINDINGS: INTRACRANIAL CONTENTS: No reduced diffusion to suggest acute ischemia or intraparenchymal hypercellular tumor. No susceptibility artifact to suggest hemorrhage. Patchy to confluent new supratentorial predominately corona radiata to centrum semiovale FLAIR T2 hyperintensities. No midline shift, mass effect or intraparenchymal masses. Ventricles and sulci are normal for patient's age. 5 mm enhancing LEFT parietal dural based metastasis, accounting for FLAIR T2 hyperintense signal in this area on prior MRI. Diffuse LEFT greater than RIGHT generally smooth thickened dural enhancement. No abnormal extra-axial fluid collections. VASCULAR: Normal major intracranial vascular flow voids present at skull base. SKULL AND UPPER CERVICAL SPINE: Unchanged focal susceptibility artifact RIGHT occipital calvarium with corresponding bright FLAIR T2 hyperintense signal and enhancement. Diffusely heterogeneous  bone marrow signal with patchy enhancement compatible with metastatic disease. No abnormal sellar expansion. Craniocervical junction maintained. Old C3 pathologic fracture. SINUSES/ORBITS: Bilateral mastoid effusions. Trace paranasal sinus mucosal thickening. The included ocular globes and orbital contents are non-suspicious. OTHER: None. IMPRESSION: Solitary 4 mm LEFT parietal dural metastasis in a  background of leptomeningeal metastatic disease. Diffuse calvarial metastasis. No new metastasis or intraparenchymal disease though comparison limited by prior noncontrast examination. New white matter T2 hyperintense signal/pallor most compatible with treatment related changes. Electronically Signed   By: Elon Alas M.D.   On: 07/16/2016 20:20    ELIGIBLE FOR AVAILABLE RESEARCH PROTOCOL: no  ASSESSMENT: 51 y.o. BRCA negative [place] woman originally from [place]  (1) status post left breast upper inner quadrant biopsy 05/17/2007 for a clinical mT2 N2, stage IIIA invasive ductal breast cancer, strongly estrogen receptor positive, weakly progesterone receptor positive, HER-2 negative, with an MIB-1 of 52%  (2) neoadjuvant chemotherapy consisted of dose dense doxorubicin and cyclophosphamide 4 followed by weekly paclitaxel 12, started 06/10/2007, completed 09/28/2007  (3) status post left modified radical mastectomy 10/27/2007 [facility] [phone]) removed that invasive ductal carcinoma, grade 2, pT2, pN1a (downstaged to IIB), estrogen receptor strongly positive, progesterone receptor weakly positive, HER-2 not amplified by immunohistochemistry or FISH with a signals ratio of 0.94, number per cell 3.4  (4) status post adjuvant radiation to the left chest wall as well as the left supraclavicular and axillary nodal regions (50.4 gray +10 Gray boost) given between 12/15/2007 on 01/31/2008  (5) on adjuvant tamoxifen 02/20/2008 through March 2016  METASTATIC DISEASE: March 2016, presenting with bone  pain (6) staging studies April 2016 showed diffuse bony metastatic disease, intrathoracic adenopathy, and small lung lesions.  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  (10) repeat genetic testing 04/23/2016 through the [facility] Hereditary Cancer Panel offered by [facility] found no deleterious mutations in APC, ATM, BARD1, BMPR1A, BRCA1, BRCA2, BRIP1, CHD1, CDK4, CDKN2A, CHEK2, EPCAM (large rearrangement only), GREM1/SCG5, MLH1, MSH2, MSH6, MUTYH, NBN, PALB2, PMS2, POLD1, POLE, PTEN, RAD51C, RAD51D, SMAD4, STK11, and TP53  (11) gemcitabine started 04/24/2016, to be given days 1 and 8 of each 21 day cycle  (a) day 8 cycle 1 delayed because of cytopenias--dose reduced and neupogen added   PLAN: I spent a little over 30 minutes with Latanya Presser and [name] today going over her scans. She has completed 3 cycles of gemcitabine, with fair tolerance. She also receives denosumab/Xgeva. We see clear improvement in the measurable disease in the liver. To me this is crucial area  Bone lesions are not measurable disease. They understand that bone lesions may look worse when they're actually healing accordingly we interpreted bone lesions in the context of the other lesions which are measurable and also in the context of the CA-27-29 him a which has decreased to approximately one third of prior. In short I think what we are seeing is bone  healing and inflammation not worsening bone disease.  Her brain MRI is difficult for me to interpret. It is being compared to a noncontrast study. I am reading it essentially is stable. However this really needs to be reviewed by the central nervous system tumor group and she will be presented next Monday and she has a meeting with Dr. [name] then. If he feels she needs further treatment I  don't have a problem with that. If he feels observation is appropriate and repeat in 3 months, I also have no problems with that.   Overall however these are favorable restaging results. We then discussed how to proceed forward. At the last visit she expressed multiple frustrations regarding the fact that she doesn't have a life. I can sympathize with that. On the other hand she is getting longer life because of these treatments. That is the bargain area  My recommendation is that we continue as we have been doing for 3 more cycles and then restage. If we see further improvement at that point we may have enough margin to consider simpler treatments, perhaps exemestane/everolimus, that may affect her quality of life S. I then also the weather will be warmer and she will have more opportunity to actually be active.  She is willing to go along with this. She would like to take Her week off though sometime in February or early March since a friend has offered her her home [place] [address]. They will check date so let me know. Right now I think very likely the first day of the third cycle will be moved back a week to accommodate this, but we can be flexible with regards to the extra week that will be thrown in at some point during the upcoming 3 cycles of Gemzar  Her next visit will be with my partner, Dr. Sonny Dandy, since I will be out of town at that point, but she knows to call for any problems that may develop before visits here.       [name], MD   07/17/2016 10:18 AM Medical Oncology and  Hematology [facility] [address] [place], [place] [address] Tel. [phone]    Fax. [phone]

## 2016-07-17 NOTE — Patient Instructions (Signed)

## 2016-07-17 NOTE — Patient Instructions (Signed)
Cancer Center Discharge Instructions for Patients Receiving Chemotherapy  Today you received the following chemotherapy agents Gemzar  To help prevent nausea and vomiting after your treatment, we encourage you to take your nausea medication as directed.    If you develop nausea and vomiting that is not controlled by your nausea medication, call the clinic.   BELOW ARE SYMPTOMS THAT SHOULD BE REPORTED IMMEDIATELY:  *FEVER GREATER THAN 100.5 F  *CHILLS WITH OR WITHOUT FEVER  NAUSEA AND VOMITING THAT IS NOT CONTROLLED WITH YOUR NAUSEA MEDICATION  *UNUSUAL SHORTNESS OF BREATH  *UNUSUAL BRUISING OR BLEEDING  TENDERNESS IN MOUTH AND THROAT WITH OR WITHOUT PRESENCE OF ULCERS  *URINARY PROBLEMS  *BOWEL PROBLEMS  UNUSUAL RASH Items with * indicate a potential emergency and should be followed up as soon as possible.  Feel free to call the clinic you have any questions or concerns. The clinic phone number is [phone].  Please show the CHEMO ALERT CARD at check-in to the Emergency Department and triage nurse.   

## 2016-07-18 LAB — CANCER ANTIGEN 27.29: CA 27.29: 49.7 U/mL — ABNORMAL HIGH (ref 0.0–38.6)

## 2016-07-20 ENCOUNTER — Other Ambulatory Visit: Payer: Self-pay | Admitting: Oncology

## 2016-07-20 ENCOUNTER — Ambulatory Visit
Admission: RE | Admit: 2016-07-20 | Discharge: 2016-07-20 | Disposition: A | Discharge status: Home / Self Care | Payer: BLUE CROSS/BLUE SHIELD | Source: Ambulatory Visit | Attending: Radiation Oncology | Admitting: Radiation Oncology

## 2016-07-20 ENCOUNTER — Ambulatory Visit (HOSPITAL_BASED_OUTPATIENT_CLINIC_OR_DEPARTMENT_OTHER): Payer: BLUE CROSS/BLUE SHIELD

## 2016-07-20 VITALS — BP 106/72 | HR 116 | Temp 98.4°F | Resp 18 | Wt 154.2 lb

## 2016-07-20 DIAGNOSIS — Z853 Personal history of malignant neoplasm of breast: Secondary | ICD-10-CM | POA: Insufficient documentation

## 2016-07-20 DIAGNOSIS — C7800 Secondary malignant neoplasm of unspecified lung: Secondary | ICD-10-CM

## 2016-07-20 DIAGNOSIS — C7951 Secondary malignant neoplasm of bone: Secondary | ICD-10-CM | POA: Diagnosis not present

## 2016-07-20 DIAGNOSIS — C50912 Malignant neoplasm of unspecified site of left female breast: Secondary | ICD-10-CM

## 2016-07-20 DIAGNOSIS — C787 Secondary malignant neoplasm of liver and intrahepatic bile duct: Secondary | ICD-10-CM | POA: Insufficient documentation

## 2016-07-20 DIAGNOSIS — J9811 Atelectasis: Secondary | ICD-10-CM | POA: Diagnosis not present

## 2016-07-20 DIAGNOSIS — I700 Atherosclerosis of aorta: Secondary | ICD-10-CM | POA: Diagnosis not present

## 2016-07-20 DIAGNOSIS — C7949 Secondary malignant neoplasm of other parts of nervous system: Secondary | ICD-10-CM | POA: Insufficient documentation

## 2016-07-20 DIAGNOSIS — C7931 Secondary malignant neoplasm of brain: Secondary | ICD-10-CM

## 2016-07-20 DIAGNOSIS — C50212 Malignant neoplasm of upper-inner quadrant of left female breast: Secondary | ICD-10-CM

## 2016-07-20 DIAGNOSIS — Z923 Personal history of irradiation: Secondary | ICD-10-CM | POA: Insufficient documentation

## 2016-07-20 MED ORDER — TBO-FILGRASTIM 300 MCG/0.5ML ~~LOC~~ SOSY
300.0000 ug | PREFILLED_SYRINGE | Freq: Once | SUBCUTANEOUS | Status: AC
  Administered 2016-07-20: 300 ug via SUBCUTANEOUS
  Filled 2016-07-20: qty 0.5

## 2016-07-20 NOTE — Addendum Note (Signed)
Encounter addended by: Heywood Footman, RN on: 07/20/2016  1:37 PM<BR>    Actions taken: Charge Capture section accepted

## 2016-07-20 NOTE — Progress Notes (Signed)
Radiation Oncology         [phone]   Name: Jill Johnston   Date: 07/20/2016   MRN: [id number]  DOB: [birth year]    Multidisciplinary Brain and Spine Oncology Clinic Follow-Up Visit Note  CC: [name], MD  [name], MD    ICD-9-CM ICD-10-CM   1. Leptomeningeal metastases (HCC) 198.4 C79.49     Diagnosis:   51 y.o. 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  Interval Since Last Radiation:  3 months  Narrative:  The patient returns today for routine follow-up.  The recent films were presented in our multidisciplinary conference with neuroradiology just prior to the clinic.  Patient and her husband return today to review 07/16/16 MRI. Weight and vital stable. Denies pain. Denies taking decadron at this time. Reports on occasion when she rises from a laying position she will experience a headache that typically resolves quickly. Denies dizziness. Reports occasional nausea. Denies vomiting. Reports a poor appetite. Reports she has "to make" herself eat. Denies tinnitus. Reports for one week she has felt stuffy in her noses and ears causing her hearing to be muffled. Denies cough. Reports vision has diminished some. Reports difficulty reading up close at times. Reports fatigue related to effects of chemotherapy on Friday. Alopecia noted. Reports she is upset her hair hasn't began to grow back                              ALLERGIES:  has No Known Allergies.  Meds: Current Outpatient Prescriptions  Medication Sig Dispense Refill  . docusate sodium (COLACE) 100 MG capsule Take 100 mg by mouth daily.     . fluconazole (DIFLUCAN) 100 MG tablet Take 1 tablet (100 mg total) by mouth daily as needed (for thrush). 10 tablet 0  . ibuprofen (ADVIL,MOTRIN) 200 MG tablet Take 400 mg by mouth daily as needed.    .  lidocaine-prilocaine (EMLA) cream Apply 1 application topically as needed. To port 1 hour before going to be accessed with needle. Cover with plastic wrap. 30 g 1  . lisinopril (PRINIVIL,ZESTRIL) 10 MG tablet Take 1 tablet (10 mg total) by mouth daily. 30 tablet 0  . oxyCODONE (OXYCONTIN) 30 MG 12 hr tablet Take 30 mg by mouth 2 (two) times daily. 60 each 0  . Oxycodone HCl 10 MG TABS Take 1 tablet by mouth every 3 (three) hours as needed for pain.    . pantoprazole (PROTONIX) 40 MG tablet TAKE 1 TABLET(40 MG) BY MOUTH TWICE DAILY BEFORE A MEAL 60 tablet 5  . polyethylene glycol (MIRALAX / GLYCOLAX) packet Take 17 grams by mouth every day. 14 each 6   No current facility-administered medications for this encounter.     Physical Findings: The patient is in no acute distress. Patient is alert and oriented.  weight is 154 lb 3.2 oz (69.9 kg). Her oral temperature is 98.4 F (36.9 C). Her blood pressure is 106/72 and her pulse is 116 (abnormal). Her respiration is 18 and oxygen saturation is 97%. .  No significant changes.  Lab Findings: Lab Results  Component Value Date   WBC 5.4 07/17/2016   HGB 11.0 (L) 07/17/2016   HCT 34.7 (L) 07/17/2016   MCV 104.5 (H) 07/17/2016   PLT 115 (L) 07/17/2016    @LASTCHEM @  Radiographic Findings:  Ct Chest W Contrast  Result Date: 07/16/2016 CLINICAL DATA:  History of breast cancer.  Followup. EXAM: CT CHEST WITH CONTRAST TECHNIQUE: Multidetector CT imaging of the chest was performed during intravenous contrast administration. CONTRAST:  37mL ISOVUE-300 IOPAMIDOL (ISOVUE-300) INJECTION 61% COMPARISON:  05/04/2016 FINDINGS: Cardiovascular: The heart size appears within normal limits. There is no pericardial effusion. Aortic atherosclerosis is noted. Mediastinum/Nodes: The trachea appears patent and is midline. Normal appearance of the esophagus. No mediastinal or hilar adenopathy. No axillary adenopathy. Lungs/Pleura: No pleural fluid. Chronic subsegmental  consolidation/ atelectasis of the right middle lobe is identified. Mild irregular and basilar predominant septal thickening is again noted, stable from previous exam. 5 mm posterior right upper lobe lung nodule is unchanged from previous exam, image 27 of series 5. Upper Abdomen: Anterior right lobe of liver metastasis measures 2.6 cm, image 94 of series 2. Previously 3.4 cm. Posterior right lobe of liver metastases measures 3.5 cm 3.4 cm, image 162 of series 7. Previously 4.6 cm. No acute abnormality noted within the upper abdomen. Musculoskeletal: Multifocal mixed lytic and sclerotic bone metastases are again identified within the thoracic spine and bilateral ribs. Compared with previous exam there has been interval progression of disease within the spine. IMPRESSION: 1. Increase in mixed lytic and sclerotic bone metastasis within the thoracic spine. 2. Decrease in size of liver metastases. 3. Stable appearance of irregular septal thickening which appears lower lobe predominant and may reflect lymphangitic spread of tumor. Small right apical nodule is unchanged from previous exam. Electronically Signed   By: Kerby Moors M.D.   On: 07/16/2016 15:56   Mr Jeri Cos [id number] Contrast  Result Date: 07/16/2016 CLINICAL DATA:  Restaging metastatic breast cancer, evaluate for CNS recurrence. Known calvarial and leptomeningeal metastasis. Prior radiation treatment. EXAM: MRI HEAD WITHOUT AND WITH CONTRAST TECHNIQUE: Multiplanar, multiecho pulse sequences of the brain and surrounding structures were obtained without and with intravenous contrast. CONTRAST:  14 cc MultiHance COMPARISON:  MRI head April 15, 2016 FINDINGS: INTRACRANIAL CONTENTS: No reduced diffusion to suggest acute ischemia or intraparenchymal hypercellular tumor. No susceptibility artifact to suggest hemorrhage. Patchy to confluent new supratentorial predominately corona radiata to centrum semiovale FLAIR T2 hyperintensities. No midline shift, mass effect  or intraparenchymal masses. Ventricles and sulci are normal for patient's age. 5 mm enhancing LEFT parietal dural based metastasis, accounting for FLAIR T2 hyperintense signal in this area on prior MRI. Diffuse LEFT greater than RIGHT generally smooth thickened dural enhancement. No abnormal extra-axial fluid collections. VASCULAR: Normal major intracranial vascular flow voids present at skull base. SKULL AND UPPER CERVICAL SPINE: Unchanged focal susceptibility artifact RIGHT occipital calvarium with corresponding bright FLAIR T2 hyperintense signal and enhancement. Diffusely heterogeneous bone marrow signal with patchy enhancement compatible with metastatic disease. No abnormal sellar expansion. Craniocervical junction maintained. Old C3 pathologic fracture. SINUSES/ORBITS: Bilateral mastoid effusions. Trace paranasal sinus mucosal thickening. The included ocular globes and orbital contents are non-suspicious. OTHER: None. IMPRESSION: Solitary 4 mm LEFT parietal dural metastasis in a background of leptomeningeal metastatic disease. Diffuse calvarial metastasis. No new metastasis or intraparenchymal disease though comparison limited by prior noncontrast examination. New white matter T2 hyperintense signal/pallor most compatible with treatment related changes. Electronically Signed   By: Elon Alas M.D.   On: 07/16/2016 20:20    Impression:  The patient is recovering from the effects of radiation.    Plan:  MRI in 3 months then follow-up.  _____________________________________  Sheral Apley. [name], M.D.

## 2016-07-20 NOTE — Progress Notes (Signed)
Patient and her husband return today to review 07/16/16 MRI. Weight and vital stable. Denies pain. Denies taking decadron at this time. Reports on occasion when she rises from a laying position she will experience a headache that typically resolves quickly. Denies dizziness. Reports occasional nausea. Denies vomiting. Reports a poor appetite. Reports she has "to make" herself eat. Denies tinnitus. Reports for one week she has felt stuffy in her noses and ears causing her hearing to be muffled. Denies cough. Reports vision has diminished some. Reports difficulty reading up close at times. Reports fatigue related to effects of chemotherapy on Friday. Alopecia noted. Reports she is upset her hair hasn't began to grow back.   BP 106/72 (BP Location: Right Arm, Patient Position: Sitting, Cuff Size: Normal)   Pulse (!) 116   Temp 98.4 F (36.9 C) (Oral)   Resp 18   Wt 154 lb 3.2 oz (69.9 kg)   SpO2 97%   BMI 25.66 kg/m  Wt Readings from Last 3 Encounters:  07/20/16 154 lb 3.2 oz (69.9 kg)  07/17/16 154 lb 1.6 oz (69.9 kg)  07/10/16 153 lb 11.2 oz (69.7 kg)

## 2016-07-20 NOTE — Patient Instructions (Signed)
Tbo-Filgrastim injection What is this medicine? TBO-FILGRASTIM (T B O fil GRA stim) is a granulocyte colony-stimulating factor that stimulates the growth of neutrophils, a type of white blood cell important in the body's fight against infection. It is used to reduce the incidence of fever and infection in patients with certain types of cancer who are receiving chemotherapy that affects the bone marrow. COMMON BRAND NAME(S): Granix What should I tell my health care provider before I take this medicine? They need to know if you have any of these conditions: -ongoing radiation therapy -sickle cell anemia -an unusual or allergic reaction to tbo-filgrastim, filgrastim, pegfilgrastim, other medicines, foods, dyes, or preservatives -pregnant or trying to get pregnant -breast-feeding How should I use this medicine? This medicine is for injection under the skin. If you get this medicine at home, you will be taught how to prepare and give this medicine. Refer to the Instructions for Use that come with your medication packaging. Use exactly as directed. Take your medicine at regular intervals. Do not take your medicine more often than directed. It is important that you put your used needles and syringes in a special sharps container. Do not put them in a trash can. If you do not have a sharps container, call your pharmacist or healthcare provider to get one. Talk to your pediatrician regarding the use of this medicine in children. Special care may be needed. What if I miss a dose? It is important not to miss your dose. Call your doctor or health care professional if you miss a dose. What may interact with this medicine? This medicine may interact with the following medications: -medicines that may cause a release of neutrophils, such as lithium What should I watch for while using this medicine? You may need blood work done while you are taking this medicine. What side effects may I notice from receiving  this medicine? Side effects that you should report to your doctor or health care professional as soon as possible: -allergic reactions like skin rash, itching or hives, swelling of the face, lips, or tongue -shortness of breath or breathing problems -fever -pain, redness, or irritation at site where injected -pinpoint red spots on the skin -stomach or side pain, or pain at the shoulder -swelling -tiredness -trouble passing urine Side effects that usually do not require medical attention (report to your doctor or health care professional if they continue or are bothersome): -bone pain -muscle pain Where should I keep my medicine? Keep out of the reach of children. Store in a refrigerator between 2 and 8 degrees C (36 and 46 degrees F). Keep in carton to protect from light. Throw away this medicine if it is left out of the refrigerator for more than 5 consecutive days. Throw away any unused medicine after the expiration date.  2017 Elsevier/Gold Standard (2015-07-18 12:15:25)  

## 2016-07-21 ENCOUNTER — Ambulatory Visit (HOSPITAL_BASED_OUTPATIENT_CLINIC_OR_DEPARTMENT_OTHER): Payer: BLUE CROSS/BLUE SHIELD

## 2016-07-21 ENCOUNTER — Other Ambulatory Visit: Payer: Self-pay | Admitting: Radiation Therapy

## 2016-07-21 VITALS — BP 112/82 | HR 100 | Temp 97.0°F | Resp 20

## 2016-07-21 DIAGNOSIS — C7800 Secondary malignant neoplasm of unspecified lung: Secondary | ICD-10-CM | POA: Diagnosis not present

## 2016-07-21 DIAGNOSIS — C7949 Secondary malignant neoplasm of other parts of nervous system: Principal | ICD-10-CM

## 2016-07-21 DIAGNOSIS — C7931 Secondary malignant neoplasm of brain: Secondary | ICD-10-CM

## 2016-07-21 DIAGNOSIS — C50912 Malignant neoplasm of unspecified site of left female breast: Secondary | ICD-10-CM | POA: Diagnosis not present

## 2016-07-21 DIAGNOSIS — Z5189 Encounter for other specified aftercare: Secondary | ICD-10-CM

## 2016-07-21 DIAGNOSIS — C7951 Secondary malignant neoplasm of bone: Secondary | ICD-10-CM | POA: Diagnosis not present

## 2016-07-21 DIAGNOSIS — C787 Secondary malignant neoplasm of liver and intrahepatic bile duct: Secondary | ICD-10-CM | POA: Diagnosis not present

## 2016-07-21 MED ORDER — TBO-FILGRASTIM 300 MCG/0.5ML ~~LOC~~ SOSY
300.0000 ug | PREFILLED_SYRINGE | Freq: Once | SUBCUTANEOUS | Status: AC
  Administered 2016-07-21: 300 ug via SUBCUTANEOUS
  Filled 2016-07-21: qty 0.5

## 2016-07-21 NOTE — Patient Instructions (Signed)
Tbo-Filgrastim injection What is this medicine? TBO-FILGRASTIM (T B O fil GRA stim) is a granulocyte colony-stimulating factor that stimulates the growth of neutrophils, a type of white blood cell important in the body's fight against infection. It is used to reduce the incidence of fever and infection in patients with certain types of cancer who are receiving chemotherapy that affects the bone marrow. COMMON BRAND NAME(S): Granix What should I tell my health care provider before I take this medicine? They need to know if you have any of these conditions: -ongoing radiation therapy -sickle cell anemia -an unusual or allergic reaction to tbo-filgrastim, filgrastim, pegfilgrastim, other medicines, foods, dyes, or preservatives -pregnant or trying to get pregnant -breast-feeding How should I use this medicine? This medicine is for injection under the skin. If you get this medicine at home, you will be taught how to prepare and give this medicine. Refer to the Instructions for Use that come with your medication packaging. Use exactly as directed. Take your medicine at regular intervals. Do not take your medicine more often than directed. It is important that you put your used needles and syringes in a special sharps container. Do not put them in a trash can. If you do not have a sharps container, call your pharmacist or healthcare provider to get one. Talk to your pediatrician regarding the use of this medicine in children. Special care may be needed. What if I miss a dose? It is important not to miss your dose. Call your doctor or health care professional if you miss a dose. What may interact with this medicine? This medicine may interact with the following medications: -medicines that may cause a release of neutrophils, such as lithium What should I watch for while using this medicine? You may need blood work done while you are taking this medicine. What side effects may I notice from receiving  this medicine? Side effects that you should report to your doctor or health care professional as soon as possible: -allergic reactions like skin rash, itching or hives, swelling of the face, lips, or tongue -shortness of breath or breathing problems -fever -pain, redness, or irritation at site where injected -pinpoint red spots on the skin -stomach or side pain, or pain at the shoulder -swelling -tiredness -trouble passing urine Side effects that usually do not require medical attention (report to your doctor or health care professional if they continue or are bothersome): -bone pain -muscle pain Where should I keep my medicine? Keep out of the reach of children. Store in a refrigerator between 2 and 8 degrees C (36 and 46 degrees F). Keep in carton to protect from light. Throw away this medicine if it is left out of the refrigerator for more than 5 consecutive days. Throw away any unused medicine after the expiration date.  2017 Elsevier/Gold Standard (2015-07-18 12:15:25)  

## 2016-07-24 ENCOUNTER — Ambulatory Visit: Payer: BLUE CROSS/BLUE SHIELD

## 2016-07-24 ENCOUNTER — Other Ambulatory Visit: Payer: BLUE CROSS/BLUE SHIELD

## 2016-07-29 ENCOUNTER — Ambulatory Visit: Payer: Self-pay | Admitting: Radiation Oncology

## 2016-07-30 NOTE — Assessment & Plan Note (Signed)
51 y.o. BRCA negative [place] woman originally from [place]  (1) status post left breast upper inner quadrant biopsy 05/17/2007 for a clinical mT2 N2, stage IIIA invasive ductal breast cancer, strongly estrogen receptor positive, weakly progesterone receptor positive, HER-2 negative, with an MIB-1 of 52%  (2) neoadjuvant chemotherapy consisted of dose dense doxorubicin and cyclophosphamide 4 followed by weekly paclitaxel 12, started 06/10/2007, completed 09/28/2007  (3) status post left modified radical mastectomy 10/27/2007 at [facility] [phone]) removed that invasive ductal carcinoma, grade 2, pT2, pN1a (downstaged to IIB), estrogen receptor strongly positive, progesterone receptor weakly positive, HER-2 not amplified by immunohistochemistry or FISH with a signals ratio of 0.94, number per cell 3.4  (4) status post adjuvant radiation to the left chest wall as well as the left supraclavicular and axillary nodal regions (50.4 gray +10 Gray boost) given between 12/15/2007 on 01/31/2008  (5) on adjuvant tamoxifen 02/20/2008 through March 2016  METASTATIC DISEASE: March 2016, presenting with bone pain (6) staging studies April 2016 showed diffuse bony metastatic disease, intrathoracic adenopathy, and small lung lesions.             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, estrogen receptor strongly positive, progesterone receptor and HER-2 negative             (b) CA-27-29 is informative  (7) denosumab/Xgeva started April 2016,  (8) letrozole/palbociclib started April 2016,  discontinued September 2017 with progression             (a) restaging studies September 2017 document bone, lung, and liver involvement             (b) brain MRI September 2017 c/w leptomeningeal spread, +/- parietal metastases  (9) whole brain irradiation 03/30/16 - 04/10/16 Site/dose:The whole brain was treated to 30Gy in 84factions of 3Gy.  (10) repeat genetic  testing 10/26/2017Overall however these are favorable restaging results.   My recommendation is that we continue as we have been doing for 3 more cycles and then restage. If we see further improvement at that point we may have enough margin to consider simpler treatments, perhaps exemestane/everolimus, that may affect her quality of life S. I then also the weather will be warmer and she will have more opportunity to actually be active.  She is willing to go along with this. She would like to take Her week off though sometime in February or early March since a friend has offered her [place] [phone] They will check date so let me know. Right now I think very likely the first day of the third cycle will be moved back a week to accommodate this, but we can be flexible with regards to the extra week that will be thrown in at some point during the upcoming 3 cycles of Gemzar

## 2016-07-31 ENCOUNTER — Other Ambulatory Visit (HOSPITAL_BASED_OUTPATIENT_CLINIC_OR_DEPARTMENT_OTHER): Payer: BLUE CROSS/BLUE SHIELD

## 2016-07-31 ENCOUNTER — Encounter: Payer: Self-pay | Admitting: Hematology and Oncology

## 2016-07-31 ENCOUNTER — Ambulatory Visit: Payer: BLUE CROSS/BLUE SHIELD

## 2016-07-31 ENCOUNTER — Ambulatory Visit (HOSPITAL_BASED_OUTPATIENT_CLINIC_OR_DEPARTMENT_OTHER): Payer: BLUE CROSS/BLUE SHIELD | Admitting: Hematology and Oncology

## 2016-07-31 ENCOUNTER — Other Ambulatory Visit: Payer: BLUE CROSS/BLUE SHIELD

## 2016-07-31 ENCOUNTER — Ambulatory Visit (HOSPITAL_BASED_OUTPATIENT_CLINIC_OR_DEPARTMENT_OTHER): Payer: BLUE CROSS/BLUE SHIELD

## 2016-07-31 DIAGNOSIS — C50912 Malignant neoplasm of unspecified site of left female breast: Secondary | ICD-10-CM

## 2016-07-31 DIAGNOSIS — C787 Secondary malignant neoplasm of liver and intrahepatic bile duct: Secondary | ICD-10-CM | POA: Diagnosis not present

## 2016-07-31 DIAGNOSIS — C7951 Secondary malignant neoplasm of bone: Principal | ICD-10-CM

## 2016-07-31 DIAGNOSIS — C7931 Secondary malignant neoplasm of brain: Secondary | ICD-10-CM

## 2016-07-31 DIAGNOSIS — Z170 Estrogen receptor positive status [ER+]: Secondary | ICD-10-CM

## 2016-07-31 DIAGNOSIS — C7800 Secondary malignant neoplasm of unspecified lung: Secondary | ICD-10-CM

## 2016-07-31 DIAGNOSIS — Z5111 Encounter for antineoplastic chemotherapy: Secondary | ICD-10-CM | POA: Diagnosis not present

## 2016-07-31 DIAGNOSIS — C7949 Secondary malignant neoplasm of other parts of nervous system: Secondary | ICD-10-CM

## 2016-07-31 DIAGNOSIS — C7802 Secondary malignant neoplasm of left lung: Secondary | ICD-10-CM

## 2016-07-31 DIAGNOSIS — C50919 Malignant neoplasm of unspecified site of unspecified female breast: Secondary | ICD-10-CM

## 2016-07-31 DIAGNOSIS — C50922 Malignant neoplasm of unspecified site of left male breast: Secondary | ICD-10-CM

## 2016-07-31 LAB — CBC WITH DIFFERENTIAL/PLATELET
BASO%: 0.4 % (ref 0.0–2.0)
Basophils Absolute: 0 10*3/uL (ref 0.0–0.1)
EOS ABS: 0.1 10*3/uL (ref 0.0–0.5)
EOS%: 3.8 % (ref 0.0–7.0)
HCT: 34.6 % — ABNORMAL LOW (ref 34.8–46.6)
HEMOGLOBIN: 11.5 g/dL — AB (ref 11.6–15.9)
LYMPH%: 23.4 % (ref 14.0–49.7)
MCH: 33.5 pg (ref 25.1–34.0)
MCHC: 33.2 g/dL (ref 31.5–36.0)
MCV: 100.9 fL (ref 79.5–101.0)
MONO#: 0.4 10*3/uL (ref 0.1–0.9)
MONO%: 17.2 % — AB (ref 0.0–14.0)
NEUT%: 55.2 % (ref 38.4–76.8)
NEUTROS ABS: 1.3 10*3/uL — AB (ref 1.5–6.5)
Platelets: 194 10*3/uL (ref 145–400)
RBC: 3.43 10*6/uL — ABNORMAL LOW (ref 3.70–5.45)
RDW: 14.5 % (ref 11.2–14.5)
WBC: 2.4 10*3/uL — AB (ref 3.9–10.3)
lymph#: 0.6 10*3/uL — ABNORMAL LOW (ref 0.9–3.3)

## 2016-07-31 LAB — COMPREHENSIVE METABOLIC PANEL
ALBUMIN: 3.2 g/dL — AB (ref 3.5–5.0)
ALK PHOS: 129 U/L (ref 40–150)
ALT: 43 U/L (ref 0–55)
AST: 45 U/L — ABNORMAL HIGH (ref 5–34)
Anion Gap: 4 mEq/L (ref 3–11)
BILIRUBIN TOTAL: 0.4 mg/dL (ref 0.20–1.20)
BUN: 6.1 mg/dL — AB (ref 7.0–26.0)
CO2: 26 meq/L (ref 22–29)
CREATININE: 0.6 mg/dL (ref 0.6–1.1)
Calcium: 8.4 mg/dL (ref 8.4–10.4)
Chloride: 108 mEq/L (ref 98–109)
GLUCOSE: 83 mg/dL (ref 70–140)
Potassium: 4.2 mEq/L (ref 3.5–5.1)
SODIUM: 138 meq/L (ref 136–145)
TOTAL PROTEIN: 5.8 g/dL — AB (ref 6.4–8.3)

## 2016-07-31 MED ORDER — ONDANSETRON HCL 8 MG PO TABS
ORAL_TABLET | ORAL | Status: AC
  Filled 2016-07-31: qty 1

## 2016-07-31 MED ORDER — SODIUM CHLORIDE 0.9% FLUSH
10.0000 mL | INTRAVENOUS | Status: DC | PRN
  Administered 2016-07-31: 10 mL
  Filled 2016-07-31: qty 10

## 2016-07-31 MED ORDER — ONDANSETRON HCL 4 MG/2ML IJ SOLN
8.0000 mg | Freq: Once | INTRAMUSCULAR | Status: AC
  Administered 2016-07-31: 8 mg via INTRAVENOUS

## 2016-07-31 MED ORDER — HEPARIN SOD (PORK) LOCK FLUSH 100 UNIT/ML IV SOLN
500.0000 [IU] | Freq: Once | INTRAVENOUS | Status: AC | PRN
  Administered 2016-07-31: 500 [IU]
  Filled 2016-07-31: qty 5

## 2016-07-31 MED ORDER — SODIUM CHLORIDE 0.9 % IV SOLN
Freq: Once | INTRAVENOUS | Status: AC
  Administered 2016-07-31: 11:00:00 via INTRAVENOUS

## 2016-07-31 MED ORDER — SODIUM CHLORIDE 0.9 % IV SOLN: Freq: Once | INTRAVENOUS | Status: DC

## 2016-07-31 MED ORDER — SODIUM CHLORIDE 0.9 % IV SOLN
600.0000 mg/m2 | Freq: Once | INTRAVENOUS | Status: AC
  Administered 2016-07-31: 1064 mg via INTRAVENOUS
  Filled 2016-07-31: qty 28

## 2016-07-31 MED ORDER — ONDANSETRON HCL 4 MG/2ML IJ SOLN
INTRAMUSCULAR | Status: AC
  Filled 2016-07-31: qty 4

## 2016-07-31 NOTE — Progress Notes (Signed)
Per Amelia Jo RN: Dr Lindi Adie ok to [facility] with ANC 1.3 today

## 2016-07-31 NOTE — Progress Notes (Signed)
Patient Care Team: Jill Cruel, MD as PCP - General (Oncology) Jill Pita, MD as Consulting Physician (Radiation Oncology) Jill Johnston as Consulting Physician (Internal Medicine)  DIAGNOSIS:  Encounter Diagnosis  Name Primary?  . 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 Today is cycle 4 day 1. She feels moderate fatigue and mild nausea with chemotherapy as well as sleeping quite a lot through the day. Denies any fevers or chills.  REVIEW OF SYSTEMS:   Constitutional: Denies fevers, chills or abnormal weight loss, severe fatigue Eyes: Denies blurriness of vision Ears, nose, mouth, throat, and face: Denies mucositis or sore throat Respiratory: Denies cough, dyspnea or wheezes Cardiovascular: Denies palpitation, chest discomfort Gastrointestinal:  Denies nausea, heartburn or change in bowel habits Skin: Denies abnormal skin rashes Lymphatics: Denies new lymphadenopathy or easy bruising Neurological:Denies numbness, tingling or new weaknesses Behavioral/Psych: Mood is stable, no new changes  Extremities: No lower extremity edema  All other systems were reviewed with the patient and are negative.  I have reviewed the past medical history, past surgical history, social history and family history with the patient and they are unchanged from previous note.  ALLERGIES:  has No Known Allergies.  MEDICATIONS:  Current Outpatient Prescriptions  Medication Sig Dispense Refill  . docusate sodium (COLACE) 100 MG capsule Take 100 mg by mouth daily.     . fluconazole (DIFLUCAN) 100 MG tablet Take 1 tablet (100 mg total) by mouth daily as needed (for thrush). 10 tablet 0  . ibuprofen (ADVIL,MOTRIN) 200 MG tablet Take 400 mg by mouth daily as needed.    . lidocaine-prilocaine  (EMLA) cream Apply 1 application topically as needed. To port 1 hour before going to be accessed with needle. Cover with plastic wrap. 30 g 1  . lisinopril (PRINIVIL,ZESTRIL) 10 MG tablet Take 1 tablet (10 mg total) by mouth daily. 30 tablet 0  . oxyCODONE (OXYCONTIN) 30 MG 12 hr tablet Take 30 mg by mouth 2 (two) times daily. 60 each 0  . Oxycodone HCl 10 MG TABS Take 1 tablet by mouth every 3 (three) hours as needed for pain.    . pantoprazole (PROTONIX) 40 MG tablet TAKE 1 TABLET(40 MG) BY MOUTH TWICE DAILY BEFORE A MEAL 60 tablet 5  . polyethylene glycol (MIRALAX / GLYCOLAX) packet Take 17 grams by mouth every day. 14 each 6   No current facility-administered medications for this visit.     PHYSICAL EXAMINATION: ECOG PERFORMANCE STATUS: 2 - Symptomatic, <50% confined to bed  Vitals:   07/31/16 1008  BP: 116/83  Pulse: 92  Resp: 17  Temp: 97.7 F (36.5 C)   Filed Weights   07/31/16 1008  Weight: 155 lb 9.6 oz (70.6 kg)    GENERAL:alert, no distress and comfortable SKIN: skin color, texture, turgor are normal, no rashes or significant lesions EYES: normal, Conjunctiva are pink and non-injected, sclera clear OROPHARYNX:no exudate, no erythema and lips, buccal mucosa, and tongue normal  NECK: supple, thyroid normal size, non-tender, without nodularity LYMPH:  no palpable lymphadenopathy in the cervical, axillary or inguinal LUNGS: clear to auscultation and percussion with normal breathing effort HEART: regular rate & rhythm and no murmurs and no lower extremity edema ABDOMEN:abdomen soft, non-tender and normal bowel sounds MUSCULOSKELETAL:no cyanosis of digits and no clubbing  NEURO: alert & oriented  x 3 with fluent speech, no focal motor/sensory deficits EXTREMITIES: No lower extremity edema  LABORATORY DATA:  I have reviewed the data as listed   Chemistry      Component Value Date/Time   NA 140 07/17/2016 0935   K 3.4 (L) 07/17/2016 0935   CL 105 05/20/2016 0930   CO2  24 07/17/2016 0935   BUN 5.5 (L) 07/17/2016 0935   CREATININE 0.6 07/17/2016 0935      Component Value Date/Time   CALCIUM 8.2 (L) 07/17/2016 0935   ALKPHOS 143 07/17/2016 0935   AST 48 (H) 07/17/2016 0935   ALT 61 (H) 07/17/2016 0935   BILITOT 0.38 07/17/2016 0935       Lab Results  Component Value Date   WBC 2.4 (L) 07/31/2016   HGB 11.5 (L) 07/31/2016   HCT 34.6 (L) 07/31/2016   MCV 100.9 07/31/2016   PLT 194 07/31/2016   NEUTROABS 1.3 (L) 07/31/2016    ASSESSMENT & PLAN:  Breast cancer metastasized to lung, left ([place]) 51 y.o. BRCA negative [name] originally from [place]  (1) status post left breast upper inner quadrant biopsy 05/17/2007 for a clinical mT2 N2, stage IIIA invasive ductal breast cancer, strongly estrogen receptor positive, weakly progesterone receptor positive, HER-2 negative, with an MIB-1 of 52%  (2) neoadjuvant chemotherapy consisted of dose dense doxorubicin and cyclophosphamide 4 followed by weekly paclitaxel 12, started 06/10/2007, completed 09/28/2007  (3) status post left modified radical mastectomy 10/27/2007 at [facility] [phone]) removed that invasive ductal carcinoma, grade 2, pT2, pN1a (downstaged to IIB), estrogen receptor strongly positive, progesterone receptor weakly positive, HER-2 not amplified by immunohistochemistry or FISH with a signals ratio of 0.94, number per cell 3.4  (4) status post adjuvant radiation to the left chest wall as well as the left supraclavicular and axillary nodal regions (50.4 gray +10 Gray boost) given between 12/15/2007 on 01/31/2008  (5) on adjuvant tamoxifen 02/20/2008 through March 2016  METASTATIC DISEASE: March 2016, presenting with bone pain (6) staging studies April 2016 showed diffuse bony metastatic disease, intrathoracic adenopathy, and small lung lesions.             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, estrogen receptor strongly positive,  progesterone receptor and HER-2 negative             (b) CA-27-29 is informative  (7) denosumab/Xgeva started April 2016,  (8) letrozole/palbociclib started April 2016,  discontinued September 2017 with progression             (a) restaging studies September 2017 document bone, lung, and liver involvement             (b) brain MRI September 2017 c/w leptomeningeal spread, +/- parietal metastases  (9) whole brain irradiation 03/30/16 - 04/10/16 Site/dose:The whole brain was treated to 30Gy in 67factions of 3Gy.  (10) repeat genetic testing 10/26/2017Overall however these are favorable restaging results.  ----------------------------------------------------------------------------------------------------------------------------------------------------------------- Current treatment: Gemzar cycle 4 day 1 I reviewed her blood counts. She does have low ANC but adequate for treatment. Because of her nausea issues, I added Zofran IV to her premedications today.  Because [place], we will postpone her treatment for cycle 5 by a week.  I spent 25 minutes talking to the patient of which more than half was spent in counseling and coordination of care.  No orders of the defined types were placed in this encounter.  The  patient has a good understanding of the overall plan. she agrees with it. she will call with any problems that may develop before the next visit here.   Rulon Eisenmenger, MD 07/31/16

## 2016-07-31 NOTE — Patient Instructions (Signed)
[facility] Discharge Instructions for Patients Receiving Chemotherapy  Today you received the following chemotherapy agents Gemzar  To help prevent nausea and vomiting after your treatment, we encourage you to take your nausea medication as directed.    If you develop nausea and vomiting that is not controlled by your nausea medication, call the clinic.   BELOW ARE SYMPTOMS THAT SHOULD BE REPORTED IMMEDIATELY:  *FEVER GREATER THAN 100.5 F  *CHILLS WITH OR WITHOUT FEVER  NAUSEA AND VOMITING THAT IS NOT CONTROLLED WITH YOUR NAUSEA MEDICATION  *UNUSUAL SHORTNESS OF BREATH  *UNUSUAL BRUISING OR BLEEDING  TENDERNESS IN MOUTH AND THROAT WITH OR WITHOUT PRESENCE OF ULCERS  *URINARY PROBLEMS  *BOWEL PROBLEMS  UNUSUAL RASH Items with * indicate a potential emergency and should be followed up as soon as possible.  Feel free to call the clinic you have any questions or concerns. The clinic phone number is [phone].  Please show the CHEMO ALERT CARD at check-in to the Emergency Department and triage nurse.   

## 2016-08-03 ENCOUNTER — Ambulatory Visit (HOSPITAL_BASED_OUTPATIENT_CLINIC_OR_DEPARTMENT_OTHER): Payer: BLUE CROSS/BLUE SHIELD

## 2016-08-03 VITALS — BP 127/79 | HR 94 | Temp 97.7°F | Resp 20

## 2016-08-03 DIAGNOSIS — C7951 Secondary malignant neoplasm of bone: Secondary | ICD-10-CM | POA: Diagnosis not present

## 2016-08-03 DIAGNOSIS — C787 Secondary malignant neoplasm of liver and intrahepatic bile duct: Secondary | ICD-10-CM

## 2016-08-03 DIAGNOSIS — C7931 Secondary malignant neoplasm of brain: Secondary | ICD-10-CM

## 2016-08-03 DIAGNOSIS — C50912 Malignant neoplasm of unspecified site of left female breast: Secondary | ICD-10-CM | POA: Diagnosis not present

## 2016-08-03 DIAGNOSIS — C7800 Secondary malignant neoplasm of unspecified lung: Secondary | ICD-10-CM | POA: Diagnosis not present

## 2016-08-03 DIAGNOSIS — Z5189 Encounter for other specified aftercare: Secondary | ICD-10-CM

## 2016-08-03 MED ORDER — GADOBENATE DIMEGLUMINE 529 MG/ML IV SOLN
15.0000 mL | Freq: Once | INTRAVENOUS | Status: AC | PRN
  Administered 2016-07-16: 14 mL via INTRAVENOUS

## 2016-08-03 MED ORDER — TBO-FILGRASTIM 300 MCG/0.5ML ~~LOC~~ SOSY
300.0000 ug | PREFILLED_SYRINGE | Freq: Once | SUBCUTANEOUS | Status: AC
  Administered 2016-08-03: 300 ug via SUBCUTANEOUS
  Filled 2016-08-03: qty 0.5

## 2016-08-04 ENCOUNTER — Ambulatory Visit (HOSPITAL_BASED_OUTPATIENT_CLINIC_OR_DEPARTMENT_OTHER): Payer: BLUE CROSS/BLUE SHIELD

## 2016-08-04 VITALS — BP 109/80 | HR 100 | Temp 97.3°F | Resp 18

## 2016-08-04 DIAGNOSIS — C7802 Secondary malignant neoplasm of left lung: Secondary | ICD-10-CM | POA: Diagnosis not present

## 2016-08-04 DIAGNOSIS — C50912 Malignant neoplasm of unspecified site of left female breast: Secondary | ICD-10-CM | POA: Diagnosis not present

## 2016-08-04 DIAGNOSIS — C787 Secondary malignant neoplasm of liver and intrahepatic bile duct: Secondary | ICD-10-CM | POA: Diagnosis not present

## 2016-08-04 DIAGNOSIS — C7931 Secondary malignant neoplasm of brain: Secondary | ICD-10-CM

## 2016-08-04 DIAGNOSIS — C7951 Secondary malignant neoplasm of bone: Secondary | ICD-10-CM

## 2016-08-04 DIAGNOSIS — Z5189 Encounter for other specified aftercare: Secondary | ICD-10-CM

## 2016-08-04 MED ORDER — TBO-FILGRASTIM 300 MCG/0.5ML ~~LOC~~ SOSY
300.0000 ug | PREFILLED_SYRINGE | Freq: Once | SUBCUTANEOUS | Status: AC
  Administered 2016-08-04: 300 ug via SUBCUTANEOUS
  Filled 2016-08-04: qty 0.5

## 2016-08-04 NOTE — Patient Instructions (Signed)
Tbo-Filgrastim injection What is this medicine? TBO-FILGRASTIM (T B O fil GRA stim) is a granulocyte colony-stimulating factor that stimulates the growth of neutrophils, a type of white blood cell important in the body's fight against infection. It is used to reduce the incidence of fever and infection in patients with certain types of cancer who are receiving chemotherapy that affects the bone marrow. COMMON BRAND NAME(S): Granix What should I tell my health care provider before I take this medicine? They need to know if you have any of these conditions: -ongoing radiation therapy -sickle cell anemia -an unusual or allergic reaction to tbo-filgrastim, filgrastim, pegfilgrastim, other medicines, foods, dyes, or preservatives -pregnant or trying to get pregnant -breast-feeding How should I use this medicine? This medicine is for injection under the skin. If you get this medicine at home, you will be taught how to prepare and give this medicine. Refer to the Instructions for Use that come with your medication packaging. Use exactly as directed. Take your medicine at regular intervals. Do not take your medicine more often than directed. It is important that you put your used needles and syringes in a special sharps container. Do not put them in a trash can. If you do not have a sharps container, call your pharmacist or healthcare provider to get one. Talk to your pediatrician regarding the use of this medicine in children. Special care may be needed. What if I miss a dose? It is important not to miss your dose. Call your doctor or health care professional if you miss a dose. What may interact with this medicine? This medicine may interact with the following medications: -medicines that may cause a release of neutrophils, such as lithium What should I watch for while using this medicine? You may need blood work done while you are taking this medicine. What side effects may I notice from receiving  this medicine? Side effects that you should report to your doctor or health care professional as soon as possible: -allergic reactions like skin rash, itching or hives, swelling of the face, lips, or tongue -shortness of breath or breathing problems -fever -pain, redness, or irritation at site where injected -pinpoint red spots on the skin -stomach or side pain, or pain at the shoulder -swelling -tiredness -trouble passing urine Side effects that usually do not require medical attention (report to your doctor or health care professional if they continue or are bothersome): -bone pain -muscle pain Where should I keep my medicine? Keep out of the reach of children. Store in a refrigerator between 2 and 8 degrees C (36 and 46 degrees F). Keep in carton to protect from light. Throw away this medicine if it is left out of the refrigerator for more than 5 consecutive days. Throw away any unused medicine after the expiration date.  2017 Elsevier/Gold Standard (2015-07-18 12:15:25)  

## 2016-08-07 ENCOUNTER — Ambulatory Visit: Payer: BLUE CROSS/BLUE SHIELD

## 2016-08-07 ENCOUNTER — Other Ambulatory Visit (HOSPITAL_BASED_OUTPATIENT_CLINIC_OR_DEPARTMENT_OTHER): Payer: BLUE CROSS/BLUE SHIELD

## 2016-08-07 ENCOUNTER — Ambulatory Visit (HOSPITAL_BASED_OUTPATIENT_CLINIC_OR_DEPARTMENT_OTHER): Payer: BLUE CROSS/BLUE SHIELD

## 2016-08-07 ENCOUNTER — Ambulatory Visit: Payer: BLUE CROSS/BLUE SHIELD | Admitting: Hematology and Oncology

## 2016-08-07 VITALS — BP 130/97 | HR 91 | Temp 97.6°F | Resp 18

## 2016-08-07 DIAGNOSIS — C7951 Secondary malignant neoplasm of bone: Secondary | ICD-10-CM | POA: Diagnosis not present

## 2016-08-07 DIAGNOSIS — C787 Secondary malignant neoplasm of liver and intrahepatic bile duct: Secondary | ICD-10-CM

## 2016-08-07 DIAGNOSIS — C7802 Secondary malignant neoplasm of left lung: Secondary | ICD-10-CM | POA: Diagnosis not present

## 2016-08-07 DIAGNOSIS — C50912 Malignant neoplasm of unspecified site of left female breast: Secondary | ICD-10-CM | POA: Diagnosis not present

## 2016-08-07 DIAGNOSIS — C7949 Secondary malignant neoplasm of other parts of nervous system: Secondary | ICD-10-CM

## 2016-08-07 DIAGNOSIS — C7931 Secondary malignant neoplasm of brain: Secondary | ICD-10-CM

## 2016-08-07 DIAGNOSIS — C50922 Malignant neoplasm of unspecified site of left male breast: Secondary | ICD-10-CM

## 2016-08-07 DIAGNOSIS — Z5111 Encounter for antineoplastic chemotherapy: Secondary | ICD-10-CM

## 2016-08-07 DIAGNOSIS — C50919 Malignant neoplasm of unspecified site of unspecified female breast: Secondary | ICD-10-CM

## 2016-08-07 LAB — COMPREHENSIVE METABOLIC PANEL
ALBUMIN: 3.2 g/dL — AB (ref 3.5–5.0)
ALT: 75 U/L — ABNORMAL HIGH (ref 0–55)
AST: 74 U/L — AB (ref 5–34)
Alkaline Phosphatase: 166 U/L — ABNORMAL HIGH (ref 40–150)
Anion Gap: 8 mEq/L (ref 3–11)
BUN: 5.8 mg/dL — AB (ref 7.0–26.0)
CO2: 25 meq/L (ref 22–29)
Calcium: 8.5 mg/dL (ref 8.4–10.4)
Chloride: 108 mEq/L (ref 98–109)
Creatinine: 0.7 mg/dL (ref 0.6–1.1)
GLUCOSE: 121 mg/dL (ref 70–140)
POTASSIUM: 3.8 meq/L (ref 3.5–5.1)
SODIUM: 141 meq/L (ref 136–145)
TOTAL PROTEIN: 6.2 g/dL — AB (ref 6.4–8.3)
Total Bilirubin: 0.39 mg/dL (ref 0.20–1.20)

## 2016-08-07 LAB — CBC WITH DIFFERENTIAL/PLATELET
BASO%: 0.7 % (ref 0.0–2.0)
Basophils Absolute: 0 10*3/uL (ref 0.0–0.1)
EOS ABS: 0 10*3/uL (ref 0.0–0.5)
EOS%: 0.6 % (ref 0.0–7.0)
HCT: 35.8 % (ref 34.8–46.6)
HEMOGLOBIN: 11.7 g/dL (ref 11.6–15.9)
LYMPH%: 18.1 % (ref 14.0–49.7)
MCH: 33.7 pg (ref 25.1–34.0)
MCHC: 32.8 g/dL (ref 31.5–36.0)
MCV: 102.7 fL — AB (ref 79.5–101.0)
MONO#: 0.7 10*3/uL (ref 0.1–0.9)
MONO%: 12.8 % (ref 0.0–14.0)
NEUT%: 67.8 % (ref 38.4–76.8)
NEUTROS ABS: 3.6 10*3/uL (ref 1.5–6.5)
Platelets: 135 10*3/uL — ABNORMAL LOW (ref 145–400)
RBC: 3.48 10*6/uL — AB (ref 3.70–5.45)
RDW: 15.2 % — AB (ref 11.2–14.5)
WBC: 5.3 10*3/uL (ref 3.9–10.3)
lymph#: 1 10*3/uL (ref 0.9–3.3)

## 2016-08-07 MED ORDER — HEPARIN SOD (PORK) LOCK FLUSH 100 UNIT/ML IV SOLN
500.0000 [IU] | Freq: Once | INTRAVENOUS | Status: AC | PRN
  Administered 2016-08-07: 500 [IU]
  Filled 2016-08-07: qty 5

## 2016-08-07 MED ORDER — SODIUM CHLORIDE 0.9 % IV SOLN
Freq: Once | INTRAVENOUS | Status: AC
  Administered 2016-08-07: 13:00:00 via INTRAVENOUS

## 2016-08-07 MED ORDER — SODIUM CHLORIDE 0.9 % IV SOLN
600.0000 mg/m2 | Freq: Once | INTRAVENOUS | Status: AC
  Administered 2016-08-07: 1064 mg via INTRAVENOUS
  Filled 2016-08-07: qty 27.98

## 2016-08-07 MED ORDER — ONDANSETRON HCL 4 MG/2ML IJ SOLN
8.0000 mg | Freq: Once | INTRAMUSCULAR | Status: AC
  Administered 2016-08-07: 8 mg via INTRAVENOUS

## 2016-08-07 MED ORDER — SODIUM CHLORIDE 0.9% FLUSH
10.0000 mL | INTRAVENOUS | Status: DC | PRN
  Administered 2016-08-07: 10 mL
  Filled 2016-08-07: qty 10

## 2016-08-07 MED ORDER — ONDANSETRON HCL 4 MG/2ML IJ SOLN
INTRAMUSCULAR | Status: AC
  Filled 2016-08-07: qty 4

## 2016-08-07 MED ORDER — SODIUM CHLORIDE 0.9 % IV SOLN: Freq: Once | INTRAVENOUS | Status: DC

## 2016-08-07 NOTE — Patient Instructions (Signed)
[facility] Discharge Instructions for Patients Receiving Chemotherapy  Today you received the following chemotherapy agents Gemzar  To help prevent nausea and vomiting after your treatment, we encourage you to take your nausea medication as directed.    If you develop nausea and vomiting that is not controlled by your nausea medication, call the clinic.   BELOW ARE SYMPTOMS THAT SHOULD BE REPORTED IMMEDIATELY:  *FEVER GREATER THAN 100.5 F  *CHILLS WITH OR WITHOUT FEVER  NAUSEA AND VOMITING THAT IS NOT CONTROLLED WITH YOUR NAUSEA MEDICATION  *UNUSUAL SHORTNESS OF BREATH  *UNUSUAL BRUISING OR BLEEDING  TENDERNESS IN MOUTH AND THROAT WITH OR WITHOUT PRESENCE OF ULCERS  *URINARY PROBLEMS  *BOWEL PROBLEMS  UNUSUAL RASH Items with * indicate a potential emergency and should be followed up as soon as possible.  Feel free to call the clinic you have any questions or concerns. The clinic phone number is [phone].  Please show the CHEMO ALERT CARD at check-in to the Emergency Department and triage nurse.   

## 2016-08-07 NOTE — Patient Instructions (Signed)

## 2016-08-08 LAB — CANCER ANTIGEN 27.29: CA 27.29: 48.1 U/mL — ABNORMAL HIGH (ref 0.0–38.6)

## 2016-08-10 ENCOUNTER — Ambulatory Visit (HOSPITAL_BASED_OUTPATIENT_CLINIC_OR_DEPARTMENT_OTHER): Payer: BLUE CROSS/BLUE SHIELD

## 2016-08-10 VITALS — BP 120/78 | HR 99 | Temp 97.4°F | Resp 20

## 2016-08-10 DIAGNOSIS — C787 Secondary malignant neoplasm of liver and intrahepatic bile duct: Secondary | ICD-10-CM | POA: Diagnosis not present

## 2016-08-10 DIAGNOSIS — C50912 Malignant neoplasm of unspecified site of left female breast: Secondary | ICD-10-CM | POA: Diagnosis not present

## 2016-08-10 DIAGNOSIS — C7802 Secondary malignant neoplasm of left lung: Secondary | ICD-10-CM

## 2016-08-10 DIAGNOSIS — C7931 Secondary malignant neoplasm of brain: Secondary | ICD-10-CM

## 2016-08-10 DIAGNOSIS — C7951 Secondary malignant neoplasm of bone: Secondary | ICD-10-CM

## 2016-08-10 DIAGNOSIS — Z5189 Encounter for other specified aftercare: Secondary | ICD-10-CM

## 2016-08-10 MED ORDER — TBO-FILGRASTIM 300 MCG/0.5ML ~~LOC~~ SOSY
300.0000 ug | PREFILLED_SYRINGE | Freq: Once | SUBCUTANEOUS | Status: AC
  Administered 2016-08-10: 300 ug via SUBCUTANEOUS
  Filled 2016-08-10: qty 0.5

## 2016-08-10 NOTE — Patient Instructions (Signed)
Tbo-Filgrastim injection What is this medicine? TBO-FILGRASTIM (T B O fil GRA stim) is a granulocyte colony-stimulating factor that stimulates the growth of neutrophils, a type of white blood cell important in the body's fight against infection. It is used to reduce the incidence of fever and infection in patients with certain types of cancer who are receiving chemotherapy that affects the bone marrow. COMMON BRAND NAME(S): Granix What should I tell my health care provider before I take this medicine? They need to know if you have any of these conditions: -ongoing radiation therapy -sickle cell anemia -an unusual or allergic reaction to tbo-filgrastim, filgrastim, pegfilgrastim, other medicines, foods, dyes, or preservatives -pregnant or trying to get pregnant -breast-feeding How should I use this medicine? This medicine is for injection under the skin. If you get this medicine at home, you will be taught how to prepare and give this medicine. Refer to the Instructions for Use that come with your medication packaging. Use exactly as directed. Take your medicine at regular intervals. Do not take your medicine more often than directed. It is important that you put your used needles and syringes in a special sharps container. Do not put them in a trash can. If you do not have a sharps container, call your pharmacist or healthcare provider to get one. Talk to your pediatrician regarding the use of this medicine in children. Special care may be needed. What if I miss a dose? It is important not to miss your dose. Call your doctor or health care professional if you miss a dose. What may interact with this medicine? This medicine may interact with the following medications: -medicines that may cause a release of neutrophils, such as lithium What should I watch for while using this medicine? You may need blood work done while you are taking this medicine. What side effects may I notice from receiving  this medicine? Side effects that you should report to your doctor or health care professional as soon as possible: -allergic reactions like skin rash, itching or hives, swelling of the face, lips, or tongue -shortness of breath or breathing problems -fever -pain, redness, or irritation at site where injected -pinpoint red spots on the skin -stomach or side pain, or pain at the shoulder -swelling -tiredness -trouble passing urine Side effects that usually do not require medical attention (report to your doctor or health care professional if they continue or are bothersome): -bone pain -muscle pain Where should I keep my medicine? Keep out of the reach of children. Store in a refrigerator between 2 and 8 degrees C (36 and 46 degrees F). Keep in carton to protect from light. Throw away this medicine if it is left out of the refrigerator for more than 5 consecutive days. Throw away any unused medicine after the expiration date.  2017 Elsevier/Gold Standard (2015-07-18 12:15:25)  

## 2016-08-11 ENCOUNTER — Ambulatory Visit (HOSPITAL_BASED_OUTPATIENT_CLINIC_OR_DEPARTMENT_OTHER): Payer: BLUE CROSS/BLUE SHIELD

## 2016-08-11 VITALS — BP 126/85 | HR 94 | Temp 97.8°F | Resp 18

## 2016-08-11 DIAGNOSIS — Z5189 Encounter for other specified aftercare: Secondary | ICD-10-CM

## 2016-08-11 DIAGNOSIS — C7951 Secondary malignant neoplasm of bone: Secondary | ICD-10-CM | POA: Diagnosis not present

## 2016-08-11 DIAGNOSIS — C7802 Secondary malignant neoplasm of left lung: Secondary | ICD-10-CM | POA: Diagnosis not present

## 2016-08-11 DIAGNOSIS — C7931 Secondary malignant neoplasm of brain: Secondary | ICD-10-CM

## 2016-08-11 DIAGNOSIS — C787 Secondary malignant neoplasm of liver and intrahepatic bile duct: Secondary | ICD-10-CM | POA: Diagnosis not present

## 2016-08-11 DIAGNOSIS — C50912 Malignant neoplasm of unspecified site of left female breast: Secondary | ICD-10-CM

## 2016-08-11 MED ORDER — TBO-FILGRASTIM 300 MCG/0.5ML ~~LOC~~ SOSY
300.0000 ug | PREFILLED_SYRINGE | Freq: Once | SUBCUTANEOUS | Status: AC
  Administered 2016-08-11: 300 ug via SUBCUTANEOUS
  Filled 2016-08-11: qty 0.5

## 2016-08-14 ENCOUNTER — Other Ambulatory Visit: Payer: BLUE CROSS/BLUE SHIELD

## 2016-08-14 ENCOUNTER — Ambulatory Visit: Payer: BLUE CROSS/BLUE SHIELD

## 2016-08-18 ENCOUNTER — Other Ambulatory Visit: Payer: Self-pay | Admitting: *Deleted

## 2016-08-18 DIAGNOSIS — C7949 Secondary malignant neoplasm of other parts of nervous system: Secondary | ICD-10-CM

## 2016-08-18 MED ORDER — OXYCODONE HCL ER 30 MG PO T12A: 30.0000 mg | EXTENDED_RELEASE_TABLET | Freq: Two times a day (BID) | ORAL | 0 refills | Status: DC

## 2016-08-21 ENCOUNTER — Other Ambulatory Visit: Payer: BLUE CROSS/BLUE SHIELD

## 2016-08-21 ENCOUNTER — Ambulatory Visit: Payer: BLUE CROSS/BLUE SHIELD

## 2016-08-21 ENCOUNTER — Ambulatory Visit: Payer: BLUE CROSS/BLUE SHIELD | Admitting: Oncology

## 2016-08-24 ENCOUNTER — Ambulatory Visit: Payer: BLUE CROSS/BLUE SHIELD

## 2016-08-25 ENCOUNTER — Ambulatory Visit: Payer: BLUE CROSS/BLUE SHIELD

## 2016-08-28 ENCOUNTER — Ambulatory Visit (HOSPITAL_BASED_OUTPATIENT_CLINIC_OR_DEPARTMENT_OTHER): Payer: BLUE CROSS/BLUE SHIELD

## 2016-08-28 ENCOUNTER — Other Ambulatory Visit (HOSPITAL_BASED_OUTPATIENT_CLINIC_OR_DEPARTMENT_OTHER): Payer: BLUE CROSS/BLUE SHIELD

## 2016-08-28 ENCOUNTER — Ambulatory Visit (HOSPITAL_BASED_OUTPATIENT_CLINIC_OR_DEPARTMENT_OTHER): Payer: BLUE CROSS/BLUE SHIELD | Admitting: Oncology

## 2016-08-28 ENCOUNTER — Ambulatory Visit: Payer: BLUE CROSS/BLUE SHIELD

## 2016-08-28 DIAGNOSIS — C50912 Malignant neoplasm of unspecified site of left female breast: Secondary | ICD-10-CM

## 2016-08-28 DIAGNOSIS — C50922 Malignant neoplasm of unspecified site of left male breast: Secondary | ICD-10-CM

## 2016-08-28 DIAGNOSIS — C50212 Malignant neoplasm of upper-inner quadrant of left female breast: Secondary | ICD-10-CM | POA: Diagnosis not present

## 2016-08-28 DIAGNOSIS — C7951 Secondary malignant neoplasm of bone: Secondary | ICD-10-CM

## 2016-08-28 DIAGNOSIS — Z170 Estrogen receptor positive status [ER+]: Secondary | ICD-10-CM

## 2016-08-28 DIAGNOSIS — C787 Secondary malignant neoplasm of liver and intrahepatic bile duct: Secondary | ICD-10-CM

## 2016-08-28 DIAGNOSIS — C7931 Secondary malignant neoplasm of brain: Secondary | ICD-10-CM | POA: Diagnosis not present

## 2016-08-28 DIAGNOSIS — Z95828 Presence of other vascular implants and grafts: Secondary | ICD-10-CM

## 2016-08-28 DIAGNOSIS — C7802 Secondary malignant neoplasm of left lung: Secondary | ICD-10-CM

## 2016-08-28 DIAGNOSIS — C7800 Secondary malignant neoplasm of unspecified lung: Secondary | ICD-10-CM | POA: Diagnosis not present

## 2016-08-28 DIAGNOSIS — Z5111 Encounter for antineoplastic chemotherapy: Secondary | ICD-10-CM | POA: Diagnosis not present

## 2016-08-28 DIAGNOSIS — C50919 Malignant neoplasm of unspecified site of unspecified female breast: Secondary | ICD-10-CM

## 2016-08-28 DIAGNOSIS — R5383 Other fatigue: Secondary | ICD-10-CM

## 2016-08-28 DIAGNOSIS — C7949 Secondary malignant neoplasm of other parts of nervous system: Secondary | ICD-10-CM

## 2016-08-28 LAB — CBC WITH DIFFERENTIAL/PLATELET
BASO%: 1.1 % (ref 0.0–2.0)
Basophils Absolute: 0 10*3/uL (ref 0.0–0.1)
EOS%: 2.2 % (ref 0.0–7.0)
Eosinophils Absolute: 0.1 10*3/uL (ref 0.0–0.5)
HCT: 37.1 % (ref 34.8–46.6)
HEMOGLOBIN: 12.2 g/dL (ref 11.6–15.9)
LYMPH%: 25.6 % (ref 14.0–49.7)
MCH: 33 pg (ref 25.1–34.0)
MCHC: 32.8 g/dL (ref 31.5–36.0)
MCV: 100.7 fL (ref 79.5–101.0)
MONO#: 0.5 10*3/uL (ref 0.1–0.9)
MONO%: 21.5 % — AB (ref 0.0–14.0)
NEUT%: 49.6 % (ref 38.4–76.8)
NEUTROS ABS: 1.3 10*3/uL — AB (ref 1.5–6.5)
Platelets: 219 10*3/uL (ref 145–400)
RBC: 3.68 10*6/uL — AB (ref 3.70–5.45)
RDW: 15.1 % — AB (ref 11.2–14.5)
WBC: 2.5 10*3/uL — AB (ref 3.9–10.3)
lymph#: 0.6 10*3/uL — ABNORMAL LOW (ref 0.9–3.3)

## 2016-08-28 LAB — COMPREHENSIVE METABOLIC PANEL
ALT: 38 U/L (ref 0–55)
AST: 41 U/L — AB (ref 5–34)
Albumin: 3.2 g/dL — ABNORMAL LOW (ref 3.5–5.0)
Alkaline Phosphatase: 121 U/L (ref 40–150)
Anion Gap: 7 mEq/L (ref 3–11)
BILIRUBIN TOTAL: 0.49 mg/dL (ref 0.20–1.20)
BUN: 8.6 mg/dL (ref 7.0–26.0)
CO2: 26 meq/L (ref 22–29)
Calcium: 8.9 mg/dL (ref 8.4–10.4)
Chloride: 108 mEq/L (ref 98–109)
Creatinine: 0.7 mg/dL (ref 0.6–1.1)
GLUCOSE: 104 mg/dL (ref 70–140)
POTASSIUM: 3.9 meq/L (ref 3.5–5.1)
SODIUM: 141 meq/L (ref 136–145)
TOTAL PROTEIN: 6.1 g/dL — AB (ref 6.4–8.3)

## 2016-08-28 MED ORDER — HEPARIN SOD (PORK) LOCK FLUSH 100 UNIT/ML IV SOLN
500.0000 [IU] | Freq: Once | INTRAVENOUS | Status: AC | PRN
  Administered 2016-08-28: 500 [IU]
  Filled 2016-08-28: qty 5

## 2016-08-28 MED ORDER — SODIUM CHLORIDE 0.9% FLUSH
10.0000 mL | INTRAVENOUS | Status: DC | PRN
  Administered 2016-08-28: 10 mL
  Filled 2016-08-28: qty 10

## 2016-08-28 MED ORDER — DENOSUMAB 120 MG/1.7ML ~~LOC~~ SOLN
120.0000 mg | Freq: Once | SUBCUTANEOUS | Status: AC
  Administered 2016-08-28: 120 mg via SUBCUTANEOUS
  Filled 2016-08-28: qty 1.7

## 2016-08-28 MED ORDER — ONDANSETRON HCL 4 MG/2ML IJ SOLN
INTRAMUSCULAR | Status: AC
  Filled 2016-08-28: qty 4

## 2016-08-28 MED ORDER — GEMCITABINE HCL CHEMO INJECTION 1 GM/26.3ML
600.0000 mg/m2 | Freq: Once | INTRAVENOUS | Status: AC
  Administered 2016-08-28: 1064 mg via INTRAVENOUS
  Filled 2016-08-28: qty 27.98

## 2016-08-28 MED ORDER — SODIUM CHLORIDE 0.9% FLUSH
10.0000 mL | INTRAVENOUS | Status: DC | PRN
  Administered 2016-08-28: 10 mL via INTRAVENOUS
  Filled 2016-08-28: qty 10

## 2016-08-28 MED ORDER — OXYCODONE HCL ER 30 MG PO T12A: 30.0000 mg | EXTENDED_RELEASE_TABLET | Freq: Two times a day (BID) | ORAL | 0 refills | Status: DC

## 2016-08-28 MED ORDER — SODIUM CHLORIDE 0.9 % IV SOLN: Freq: Once | INTRAVENOUS | Status: DC

## 2016-08-28 MED ORDER — SODIUM CHLORIDE 0.9 % IV SOLN
Freq: Once | INTRAVENOUS | Status: AC
  Administered 2016-08-28: 11:00:00 via INTRAVENOUS

## 2016-08-28 MED ORDER — ONDANSETRON HCL 4 MG/2ML IJ SOLN
8.0000 mg | Freq: Once | INTRAMUSCULAR | Status: AC
  Administered 2016-08-28: 8 mg via INTRAVENOUS

## 2016-08-28 MED ORDER — OXYCODONE HCL 10 MG PO TABS: 10.0000 mg | ORAL_TABLET | ORAL | 0 refills | Status: DC | PRN

## 2016-08-28 NOTE — Progress Notes (Signed)
Per Dr. Jana Hakim okay to treat today with [place] of 1.3.

## 2016-08-28 NOTE — Patient Instructions (Signed)
[facility] Discharge Instructions for Patients Receiving Chemotherapy  Today you received the following chemotherapy agents Gemzar  To help prevent nausea and vomiting after your treatment, we encourage you to take your nausea medication as directed.    If you develop nausea and vomiting that is not controlled by your nausea medication, call the clinic.   BELOW ARE SYMPTOMS THAT SHOULD BE REPORTED IMMEDIATELY:  *FEVER GREATER THAN 100.5 F  *CHILLS WITH OR WITHOUT FEVER  NAUSEA AND VOMITING THAT IS NOT CONTROLLED WITH YOUR NAUSEA MEDICATION  *UNUSUAL SHORTNESS OF BREATH  *UNUSUAL BRUISING OR BLEEDING  TENDERNESS IN MOUTH AND THROAT WITH OR WITHOUT PRESENCE OF ULCERS  *URINARY PROBLEMS  *BOWEL PROBLEMS  UNUSUAL RASH Items with * indicate a potential emergency and should be followed up as soon as possible.  Feel free to call the clinic you have any questions or concerns. The clinic phone number is [phone].  Please show the CHEMO ALERT CARD at check-in to the Emergency Department and triage nurse.   

## 2016-08-28 NOTE — Addendum Note (Signed)
Addended by: [name] on: 08/28/2016 11:20 AM   Modules accepted: Orders

## 2016-08-28 NOTE — Progress Notes (Addendum)
[place]  [phone] Fax:(336) [phone]     ID: Jill Johnston DOB: [birth year]  MR#: [id number]  JSH#:[id number]  Patient Care Team: Jill Cruel, MD as PCP - General (Oncology) Jill Pita, MD as Consulting Physician (Radiation Oncology) Jill Johnston as Consulting Physician (Internal Medicine) Jill Cruel, MD OTHER MD:  CHIEF COMPLAINT: Estrogen receptor positive metastatic breast cancer  CURRENT TREATMENT: Gemcitabine, denosumab/Xgeva   BREAST CANCER HISTORY: From Jill. [name] Johnston's summary note 05/08/2016:  "Patient was in excellent health until diagnosed with multifocal cT2 cN2 Mx carcinoma upper inner quadrant left breast 05-17-2007. The breast cancer was ER + 100%, PR 1%, HER 2 IHC/FISH negative, Ki67 52%, BRCA 1/2 negative, initial CA 2729 WNL. She had neoadjuvant AC x4 taxol x12 dose dense from 06-10-2007 thru 09-28-2007 by Jill Jill Johnston. 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 She had radiation to chest wall, supraclavicular region and left axilla 50.4 Gy with boost to mastectomy scar to 60.4 Gy, by Jill Jill Johnston from 12-15-2007 thru 01-31-2008. She was on tamoxifen from 02-20-2008 thru 09-25-2014. She developed pain in ribs 08-2014, with CA 2729 52 and PET CT and MRI with diffuse bone metastatic disease, no cord compression, small lung lesions and intrathoracic adenopathy. 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 She began Denosumab 120 mcg monthly starting 4-7-201, and letrozole + palbociclib beginning4-12-2014. She was seen in consultation by Jill Jill Johnston [phone]. Palbociclib dose decreased to 100 mg daily x 21 q 28 days due to neutropenia. CBC on 10-25-15: WBC 2.1, Hgb 13, plt 186, ANC 0.9 PET 09-25-2014 in [facility] system and CT CAP  at [place] 10-25-15 with no clear pulmonary involvement, stable 2 cm lesion at dome of liver and 11 mm left hepatic lobe lesion. She had right tomo mammogram at [facility] 09-17-15, with heterogeneously dense breast tissue but no other mammographic findings of concern.  CA 2729 increased from 38 in 08-2015 to 49 in April 2017 and 65 in May 2017. CT CAP / PET 03-16-16 showed progression lung, liver and bone. Letrozole and ibrance DCd on 03-19-16, with plans to begin chemotherapy. 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 She elected whole brain RT, given by Jill Jill Johnston ~ 03-23-16 thru 04-10-16.  She had first gemzar on 04-24-16  She had evaluation for vaginal bleeding "from polyp", follows yearly with gynecologist in [place], up to date."  Her subsequent history is as detailed below.  INTERVAL HISTORY: Jill Johnston returns today for follow-up of her metastatic estrogen receptor positive breast cancer. She continues on gemcitabine which she receives on days 1 and 8 of each 21 day cycle. Today is day 1 cycle 6.   in the interval since her last treatment she went to [place] with friends. He was able to play some tenderness. So long as the ball comes to her she hits it well she says but her balance is poor. It was a wonderful location for her.   REVIEW OF SYSTEMS: Jill Johnston is pleased that her hair is beginning to grow back. She denies unusual headaches, visual changes, nausea, or vomiting. There have been no mouth sores. Her port is working well. She denies changes in bowel or bladder habits. She still does  not have much energy but she is able to do some cooking and a little bit of housework and occasionally she goes shopping with her husband. Aside from these issues a detailed review of systems today was remarkably benign.   PAST MEDICAL HISTORY: Past Medical History:  Diagnosis Date  .  Anxiety   . Breast cancer ([place])    Stage IV, ER positive left upper outer quadrant breast cancer with metastasis to bone  . Breast cancer metastasized to multiple sites ([place]) 04/12/2016   bone, liver, lung and brain  . GERD (gastroesophageal reflux disease)   . Hypertension     PAST SURGICAL HISTORY: Past Surgical History:  Procedure Laterality Date  . CHOLECYSTECTOMY    . ESOPHAGOGASTRODUODENOSCOPY (EGD) WITH PROPOFOL N/A 07/12/2015   Procedure: ESOPHAGOGASTRODUODENOSCOPY (EGD) WITH PROPOFOL;  Surgeon: Jill Houston, MD;  Location: [facility];  Service: Endoscopy;  Laterality: N/A;  1:00  . IR GENERIC HISTORICAL  04/22/2016   IR US GUIDE VASC ACCESS RIGHT 04/22/2016 Jill Daft, MD [facility]-INTERV RAD  . IR GENERIC HISTORICAL  04/22/2016   IR FLUORO GUIDE PORT INSERTION RIGHT 04/22/2016 Jill Daft, MD [facility]-INTER[facility]  . MASTECTOMY  April 2009   Left breast with lymph node resection    FAMILY HISTORY Family History  Problem Relation Age of Onset  . Heart attack Mother     Died age 21  . Heart disease Father     Died age 7  . Brain cancer Father     pt not sure if he actually had this  . Cancer Maternal Grandmother     dx. NOS cancer at older age; d. 31  . Heart attack Maternal Grandfather 79  . Colon cancer Paternal Grandmother     d. 28s  The patient's father died at age 49 she believes from heart disease. The patient's mother died at age 32 also from a myocardial infarction. The patient had one brother, no sisters. There is no history of breast or ovarian cancer in the family despite extensive G neurologic research   GYNECOLOGIC HISTORY:  No LMP recorded. Patient is not currently having periods (Reason: Chemotherapy).  menarche age 37, first live birth age 39, the patient is GX P1. She stopped having periods with chemotherapy in 2009 and these have not resumed.   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 They recently moved to [place] from [place]. Their son [name] is 72 years old as of November 2017 . The patient is [place] Orthodox      ADVANCED DIRECTIVES:  the patient's husband is her healthcare power of attorney. There is no living will in place    HEALTH MAINTENANCE: Social History  Substance Use Topics  . Smoking status: Never Smoker  . Smokeless tobacco: Never Used  . Alcohol use No     Colonoscopy:  PAP:  Bone density:   No Known Allergies  Current Outpatient Prescriptions  Medication Sig Dispense Refill  . docusate sodium (COLACE) 100 MG capsule Take 100 mg by mouth daily.     . fluconazole (DIFLUCAN) 100 MG tablet Take 1 tablet (100 mg total) by mouth daily as needed (for thrush). 10 tablet 0  . ibuprofen (ADVIL,MOTRIN) 200 MG tablet Take 400 mg by mouth daily as needed.    . lidocaine-prilocaine (EMLA) cream Apply 1 application topically as needed. To port 1 hour before going to be  accessed with needle. Cover with plastic wrap. 30 g 1  . lisinopril (PRINIVIL,ZESTRIL) 10 MG tablet Take 1 tablet (10 mg total) by mouth daily. 30 tablet 0  . oxyCODONE (OXYCONTIN) 30 MG 12 hr tablet Take 30 mg by mouth 2 (two) times daily. 60 each 0  . Oxycodone HCl 10 MG TABS Take 1 tablet by mouth every 3 (three) hours as needed for pain.    . pantoprazole (PROTONIX) 40 MG tablet TAKE 1 TABLET(40 MG) BY MOUTH TWICE DAILY BEFORE A MEAL 60 tablet 5  . polyethylene glycol (MIRALAX / GLYCOLAX) packet Take 17 grams by mouth every day. 14 each 6   No current facility-administered medications for this visit.     OBJECTIVE: Middle-aged white woman Woman appears stated age   50:   08/28/16 1040  BP: (!) 122/95  Pulse: 91  Resp: 18  Temp: 97.9 F (36.6 C)     Body mass index is 25.06 kg/m.    ECOG FS:1 - Symptomatic but completely ambulatory  Sclerae unicteric, pupils round and equal Oropharynx clear and moist-- no thrush or other lesions No  cervical or supraclavicular adenopathy Lungs no rales or rhonchi Heart regular rate and rhythm Abd soft, nontender, positive bowel sounds MSK no focal spinal tenderness, no upper extremity lymphedema Neuro: nonfocal, well oriented, appropriate affect Breasts: No masses palpated in either breast. Both axillae are benign.   LAB RESULTS:  CMP     Component Value Date/Time   NA 141 08/07/2016 1127   K 3.8 08/07/2016 1127   CL 105 05/20/2016 0930   CO2 25 08/07/2016 1127   GLUCOSE 121 08/07/2016 1127   BUN 5.8 (L) 08/07/2016 1127   CREATININE 0.7 08/07/2016 1127   CALCIUM 8.5 08/07/2016 1127   PROT 6.2 (L) 08/07/2016 1127   ALBUMIN 3.2 (L) 08/07/2016 1127   AST 74 (H) 08/07/2016 1127   ALT 75 (H) 08/07/2016 1127   ALKPHOS 166 (H) 08/07/2016 1127   BILITOT 0.39 08/07/2016 1127   GFRNONAA >60 05/20/2016 0930   GFRAA >60 05/20/2016 0930    INo results found for: SPEP, UPEP  Lab Results  Component Value Date   WBC 2.5 (L) 08/28/2016   NEUTROABS 1.3 (L) 08/28/2016   HGB 12.2 08/28/2016   HCT 37.1 08/28/2016   MCV 100.7 08/28/2016   PLT 219 08/28/2016      Chemistry      Component Value Date/Time   NA 141 08/07/2016 1127   K 3.8 08/07/2016 1127   CL 105 05/20/2016 0930   CO2 25 08/07/2016 1127   BUN 5.8 (L) 08/07/2016 1127   CREATININE 0.7 08/07/2016 1127      Component Value Date/Time   CALCIUM 8.5 08/07/2016 1127   ALKPHOS 166 (H) 08/07/2016 1127   AST 74 (H) 08/07/2016 1127   ALT 75 (H) 08/07/2016 1127   BILITOT 0.39 08/07/2016 1127       Lab Results  Component Value Date   LABCA2 98 (H) 02/27/2016    No components found for: [id number]  No results for input(s): INR in the last 168 hours.  Urinalysis    Component Value Date/Time   COLORURINE ORANGE (A) 05/17/2016 0852   APPEARANCEUR CLOUDY (A) 05/17/2016 0852   LABSPEC 1.027 05/17/2016 0852   PHURINE 6.0 05/17/2016 0852   GLUCOSEU NEGATIVE 05/17/2016 0852   HGBUR NEGATIVE 05/17/2016 0852    BILIRUBINUR SMALL (A) 05/17/2016 [place] 05/17/2016 0852   PROTEINUR 30 (A) 05/17/2016 6759  NITRITE NEGATIVE 05/17/2016 0852   LEUKOCYTESUR TRACE (A) 05/17/2016 0852      Ref Range & Units 3wk ago 70moago 258mogo 21m29moo   CA 27.29 0.0 - 38.6 U/mL 48.1   49.7CM   54.3CM   178.8CM    Comments: [facility]       STUDIES: No results found.  ELIGIBLE FOR AVAILABLE RESEARCH PROTOCOL: no  ASSESSMENT: 51 27o. BRCA negative [place] status post left breast upper inner quadrant biopsy 05/17/2007 for a clinical mT2 N2, stage IIIA invasive ductal breast cancer, strongly estrogen receptor positive, weakly progesterone receptor positive, HER-2 negative, with an MIB-1 of 52%  (2) neoadjuvant chemotherapy consisted of dose dense doxorubicin and cyclophosphamide 4 followed by weekly paclitaxel 12, started 06/10/2007, completed 09/28/2007  (3) status [phone] invasive ductal carcinoma, grade 2, pT2, pN1a (downstaged to IIB), estrogen receptor strongly positive, progesterone receptor weakly positive, HER-2 not amplified by immunohistochemistry or FISH with a signals ratio of 0.94, number per cell 3.4  (4) status post adjuvant radiation to the left chest wall as well as the left supraclavicular and axillary nodal regions (50.4 gray +10 Gray boost) given between 12/15/2007 on 01/31/2008  (5) on adjuvant tamoxifen 02/20/2008 through March 2016  METASTATIC DISEASE: March 2016, presenting with bone pain (6) staging studies April 2016 showed diffuse bony metastatic disease, intrathoracic adenopathy, and small lung lesions.  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  (a) brain MRI 07/16/2016 interpreted as requiring only further follow-up  (10) repeat genetic testing 04/23/2016 through the [facility] Panel [facility] no deleterious mutations in APC, ATM, BARD1, BMPR1A, BRCA1, BRCA2, BRIP1, CHD1, CDK4, CDKN2A, CHEK2, EPCAM (large rearrangement only), GREM1/SCG5, MLH1, MSH2, MSH6, MUTYH, NBN, PALB2, PMS2, POLD1, POLE, PTEN, RAD51C, RAD51D, SMAD4, STK11, and TP53  (11) gemcitabine started 04/24/2016,  given days 1 and 8 of each 21 day cycle  (a) day 8 cycle 1 delayed because of cytopenias--dose reduced and neupogen added  (b) CT scan of the chest 07/16/2016 shows decrease in the size of liver metastases.   PLAN: I [facility] approximately [facility] most of that time spent discussing her complex problems. As far as her brain metastases and concern regarding leptomeningeal disease, she is currently entirely asymptomatic. She does of course have some balance issues but she compensates well in her daily routine. It's only when she tries to play tennis that risk really comes to [facility] [facility] [employer]Of course she is also very fatigued. However she is having more energy than she used to. She is now able to participate in activities of daily living around the house and is even doing some cooking. All of this is very favorable.  Accordingly we are continuing with the Gemzar. She receives this on days 1 and 8. We discussed whether she prefers to be seen on day 8 and  she really does not think that is necessary. Accordingly I will see  her again on days 1 of cycle 7 and 8. This will bring [place] into April. She is already scheduled for a repeat brain MRI late April and I will set her up for repeat CT of the chest at that time as well. Further treatment can be decided based on those studies  Otherwise I have encouraged her to call us for any problems that may develop before the next visit here.  ADDENDUM: Note that she receives Neupogen on days 2 and 3 of each 21 day cycle, I have written for OnPro on day 8. If for some reason that is not approved she will need Neulasta on day 9.        Jill Cruel, MD   08/28/2016 10:44 AM Medical Oncology and Hematology [facility] [address] [place], [place] [address] Tel. [phone]    Fax. [phone]

## 2016-08-29 LAB — CANCER ANTIGEN 27.29: CAN 27.29: 44.6 U/mL — AB (ref 0.0–38.6)

## 2016-08-31 ENCOUNTER — Ambulatory Visit (HOSPITAL_BASED_OUTPATIENT_CLINIC_OR_DEPARTMENT_OTHER): Payer: BLUE CROSS/BLUE SHIELD

## 2016-08-31 DIAGNOSIS — C7951 Secondary malignant neoplasm of bone: Secondary | ICD-10-CM

## 2016-08-31 DIAGNOSIS — C7931 Secondary malignant neoplasm of brain: Secondary | ICD-10-CM | POA: Diagnosis not present

## 2016-08-31 DIAGNOSIS — C787 Secondary malignant neoplasm of liver and intrahepatic bile duct: Secondary | ICD-10-CM

## 2016-08-31 DIAGNOSIS — C50912 Malignant neoplasm of unspecified site of left female breast: Secondary | ICD-10-CM

## 2016-08-31 DIAGNOSIS — Z5189 Encounter for other specified aftercare: Secondary | ICD-10-CM

## 2016-08-31 DIAGNOSIS — C7800 Secondary malignant neoplasm of unspecified lung: Secondary | ICD-10-CM | POA: Diagnosis not present

## 2016-08-31 MED ORDER — TBO-FILGRASTIM 300 MCG/0.5ML ~~LOC~~ SOSY
300.0000 ug | PREFILLED_SYRINGE | Freq: Once | SUBCUTANEOUS | Status: AC
  Administered 2016-08-31: 300 ug via SUBCUTANEOUS
  Filled 2016-08-31: qty 0.5

## 2016-08-31 NOTE — Patient Instructions (Signed)
Tbo-Filgrastim injection What is this medicine? TBO-FILGRASTIM (T B O fil GRA stim) is a granulocyte colony-stimulating factor that stimulates the growth of neutrophils, a type of white blood cell important in the body's fight against infection. It is used to reduce the incidence of fever and infection in patients with certain types of cancer who are receiving chemotherapy that affects the bone marrow. COMMON BRAND NAME(S): Granix What should I tell my health care provider before I take this medicine? They need to know if you have any of these conditions: -ongoing radiation therapy -sickle cell anemia -an unusual or allergic reaction to tbo-filgrastim, filgrastim, pegfilgrastim, other medicines, foods, dyes, or preservatives -pregnant or trying to get pregnant -breast-feeding How should I use this medicine? This medicine is for injection under the skin. If you get this medicine at home, you will be taught how to prepare and give this medicine. Refer to the Instructions for Use that come with your medication packaging. Use exactly as directed. Take your medicine at regular intervals. Do not take your medicine more often than directed. It is important that you put your used needles and syringes in a special sharps container. Do not put them in a trash can. If you do not have a sharps container, call your pharmacist or healthcare provider to get one. Talk to your pediatrician regarding the use of this medicine in children. Special care may be needed. What if I miss a dose? It is important not to miss your dose. Call your doctor or health care professional if you miss a dose. What may interact with this medicine? This medicine may interact with the following medications: -medicines that may cause a release of neutrophils, such as lithium What should I watch for while using this medicine? You may need blood work done while you are taking this medicine. What side effects may I notice from receiving  this medicine? Side effects that you should report to your doctor or health care professional as soon as possible: -allergic reactions like skin rash, itching or hives, swelling of the face, lips, or tongue -shortness of breath or breathing problems -fever -pain, redness, or irritation at site where injected -pinpoint red spots on the skin -stomach or side pain, or pain at the shoulder -swelling -tiredness -trouble passing urine Side effects that usually do not require medical attention (report to your doctor or health care professional if they continue or are bothersome): -bone pain -muscle pain Where should I keep my medicine? Keep out of the reach of children. Store in a refrigerator between 2 and 8 degrees C (36 and 46 degrees F). Keep in carton to protect from light. Throw away this medicine if it is left out of the refrigerator for more than 5 consecutive days. Throw away any unused medicine after the expiration date.  2017 Elsevier/Gold Standard (2015-07-18 12:15:25)  

## 2016-09-01 ENCOUNTER — Ambulatory Visit (HOSPITAL_BASED_OUTPATIENT_CLINIC_OR_DEPARTMENT_OTHER): Payer: BLUE CROSS/BLUE SHIELD

## 2016-09-01 VITALS — BP 109/75 | HR 94 | Temp 97.5°F | Resp 18

## 2016-09-01 DIAGNOSIS — C7951 Secondary malignant neoplasm of bone: Secondary | ICD-10-CM | POA: Diagnosis not present

## 2016-09-01 DIAGNOSIS — C7800 Secondary malignant neoplasm of unspecified lung: Secondary | ICD-10-CM | POA: Diagnosis not present

## 2016-09-01 DIAGNOSIS — C50912 Malignant neoplasm of unspecified site of left female breast: Secondary | ICD-10-CM | POA: Diagnosis not present

## 2016-09-01 DIAGNOSIS — C7931 Secondary malignant neoplasm of brain: Secondary | ICD-10-CM | POA: Diagnosis not present

## 2016-09-01 DIAGNOSIS — C787 Secondary malignant neoplasm of liver and intrahepatic bile duct: Secondary | ICD-10-CM

## 2016-09-01 DIAGNOSIS — Z5189 Encounter for other specified aftercare: Secondary | ICD-10-CM

## 2016-09-01 MED ORDER — TBO-FILGRASTIM 300 MCG/0.5ML ~~LOC~~ SOSY
300.0000 ug | PREFILLED_SYRINGE | Freq: Once | SUBCUTANEOUS | Status: AC
  Administered 2016-09-01: 300 ug via SUBCUTANEOUS
  Filled 2016-09-01: qty 0.5

## 2016-09-01 NOTE — Patient Instructions (Signed)
Tbo-Filgrastim injection What is this medicine? TBO-FILGRASTIM (T B O fil GRA stim) is a granulocyte colony-stimulating factor that stimulates the growth of neutrophils, a type of white blood cell important in the body's fight against infection. It is used to reduce the incidence of fever and infection in patients with certain types of cancer who are receiving chemotherapy that affects the bone marrow. COMMON BRAND NAME(S): Granix What should I tell my health care provider before I take this medicine? They need to know if you have any of these conditions: -ongoing radiation therapy -sickle cell anemia -an unusual or allergic reaction to tbo-filgrastim, filgrastim, pegfilgrastim, other medicines, foods, dyes, or preservatives -pregnant or trying to get pregnant -breast-feeding How should I use this medicine? This medicine is for injection under the skin. If you get this medicine at home, you will be taught how to prepare and give this medicine. Refer to the Instructions for Use that come with your medication packaging. Use exactly as directed. Take your medicine at regular intervals. Do not take your medicine more often than directed. It is important that you put your used needles and syringes in a special sharps container. Do not put them in a trash can. If you do not have a sharps container, call your pharmacist or healthcare provider to get one. Talk to your pediatrician regarding the use of this medicine in children. Special care may be needed. What if I miss a dose? It is important not to miss your dose. Call your doctor or health care professional if you miss a dose. What may interact with this medicine? This medicine may interact with the following medications: -medicines that may cause a release of neutrophils, such as lithium What should I watch for while using this medicine? You may need blood work done while you are taking this medicine. What side effects may I notice from receiving  this medicine? Side effects that you should report to your doctor or health care professional as soon as possible: -allergic reactions like skin rash, itching or hives, swelling of the face, lips, or tongue -shortness of breath or breathing problems -fever -pain, redness, or irritation at site where injected -pinpoint red spots on the skin -stomach or side pain, or pain at the shoulder -swelling -tiredness -trouble passing urine Side effects that usually do not require medical attention (report to your doctor or health care professional if they continue or are bothersome): -bone pain -muscle pain Where should I keep my medicine? Keep out of the reach of children. Store in a refrigerator between 2 and 8 degrees C (36 and 46 degrees F). Keep in carton to protect from light. Throw away this medicine if it is left out of the refrigerator for more than 5 consecutive days. Throw away any unused medicine after the expiration date.  2017 Elsevier/Gold Standard (2015-07-18 12:15:25)  

## 2016-09-04 ENCOUNTER — Other Ambulatory Visit (HOSPITAL_BASED_OUTPATIENT_CLINIC_OR_DEPARTMENT_OTHER): Payer: BLUE CROSS/BLUE SHIELD

## 2016-09-04 ENCOUNTER — Ambulatory Visit (HOSPITAL_BASED_OUTPATIENT_CLINIC_OR_DEPARTMENT_OTHER): Payer: BLUE CROSS/BLUE SHIELD

## 2016-09-04 ENCOUNTER — Telehealth: Payer: Self-pay | Admitting: *Deleted

## 2016-09-04 ENCOUNTER — Ambulatory Visit: Payer: BLUE CROSS/BLUE SHIELD

## 2016-09-04 VITALS — BP 124/85 | HR 96 | Temp 98.4°F | Resp 18

## 2016-09-04 DIAGNOSIS — Z5111 Encounter for antineoplastic chemotherapy: Secondary | ICD-10-CM | POA: Diagnosis not present

## 2016-09-04 DIAGNOSIS — C787 Secondary malignant neoplasm of liver and intrahepatic bile duct: Secondary | ICD-10-CM

## 2016-09-04 DIAGNOSIS — C50919 Malignant neoplasm of unspecified site of unspecified female breast: Secondary | ICD-10-CM

## 2016-09-04 DIAGNOSIS — C7951 Secondary malignant neoplasm of bone: Principal | ICD-10-CM

## 2016-09-04 DIAGNOSIS — C50912 Malignant neoplasm of unspecified site of left female breast: Secondary | ICD-10-CM

## 2016-09-04 DIAGNOSIS — C7802 Secondary malignant neoplasm of left lung: Secondary | ICD-10-CM

## 2016-09-04 DIAGNOSIS — C7931 Secondary malignant neoplasm of brain: Secondary | ICD-10-CM | POA: Diagnosis not present

## 2016-09-04 DIAGNOSIS — C7949 Secondary malignant neoplasm of other parts of nervous system: Secondary | ICD-10-CM

## 2016-09-04 DIAGNOSIS — C7800 Secondary malignant neoplasm of unspecified lung: Secondary | ICD-10-CM

## 2016-09-04 DIAGNOSIS — Z95828 Presence of other vascular implants and grafts: Secondary | ICD-10-CM

## 2016-09-04 DIAGNOSIS — C50922 Malignant neoplasm of unspecified site of left male breast: Secondary | ICD-10-CM

## 2016-09-04 LAB — CBC WITH DIFFERENTIAL/PLATELET
BASO%: 0.5 % (ref 0.0–2.0)
Basophils Absolute: 0 10*3/uL (ref 0.0–0.1)
EOS%: 0.8 % (ref 0.0–7.0)
Eosinophils Absolute: 0 10*3/uL (ref 0.0–0.5)
HCT: 36.4 % (ref 34.8–46.6)
HGB: 11.6 g/dL (ref 11.6–15.9)
LYMPH%: 14.7 % (ref 14.0–49.7)
MCH: 32.1 pg (ref 25.1–34.0)
MCHC: 32 g/dL (ref 31.5–36.0)
MCV: 100.4 fL (ref 79.5–101.0)
MONO#: 0.6 10*3/uL (ref 0.1–0.9)
MONO%: 11.3 % (ref 0.0–14.0)
NEUT%: 72.7 % (ref 38.4–76.8)
NEUTROS ABS: 4.1 10*3/uL (ref 1.5–6.5)
Platelets: 113 10*3/uL — ABNORMAL LOW (ref 145–400)
RBC: 3.63 10*6/uL — AB (ref 3.70–5.45)
RDW: 15.4 % — ABNORMAL HIGH (ref 11.2–14.5)
WBC: 5.7 10*3/uL (ref 3.9–10.3)
lymph#: 0.8 10*3/uL — ABNORMAL LOW (ref 0.9–3.3)

## 2016-09-04 LAB — COMPREHENSIVE METABOLIC PANEL
ALT: 56 U/L — ABNORMAL HIGH (ref 0–55)
AST: 70 U/L — ABNORMAL HIGH (ref 5–34)
Albumin: 3 g/dL — ABNORMAL LOW (ref 3.5–5.0)
Alkaline Phosphatase: 146 U/L (ref 40–150)
Anion Gap: 7 mEq/L (ref 3–11)
BILIRUBIN TOTAL: 0.42 mg/dL (ref 0.20–1.20)
BUN: 5.8 mg/dL — ABNORMAL LOW (ref 7.0–26.0)
CHLORIDE: 107 meq/L (ref 98–109)
CO2: 26 meq/L (ref 22–29)
Calcium: 8.5 mg/dL (ref 8.4–10.4)
Creatinine: 0.7 mg/dL (ref 0.6–1.1)
GLUCOSE: 118 mg/dL (ref 70–140)
Potassium: 3.6 mEq/L (ref 3.5–5.1)
SODIUM: 140 meq/L (ref 136–145)
TOTAL PROTEIN: 6.1 g/dL — AB (ref 6.4–8.3)

## 2016-09-04 MED ORDER — SODIUM CHLORIDE 0.9 % IV SOLN
600.0000 mg/m2 | Freq: Once | INTRAVENOUS | Status: AC
  Administered 2016-09-04: 1064 mg via INTRAVENOUS
  Filled 2016-09-04: qty 27.98

## 2016-09-04 MED ORDER — ONDANSETRON HCL 4 MG/2ML IJ SOLN
INTRAMUSCULAR | Status: AC
  Filled 2016-09-04: qty 4

## 2016-09-04 MED ORDER — SODIUM CHLORIDE 0.9% FLUSH
10.0000 mL | INTRAVENOUS | Status: DC | PRN
Start: 2016-09-04 — End: 2016-09-04
  Administered 2016-09-04: 10 mL
  Filled 2016-09-04: qty 10

## 2016-09-04 MED ORDER — SODIUM CHLORIDE 0.9% FLUSH
10.0000 mL | INTRAVENOUS | Status: DC | PRN
  Administered 2016-09-04: 10 mL via INTRAVENOUS
  Filled 2016-09-04: qty 10

## 2016-09-04 MED ORDER — HEPARIN SOD (PORK) LOCK FLUSH 100 UNIT/ML IV SOLN
500.0000 [IU] | Freq: Once | INTRAVENOUS | Status: AC | PRN
  Administered 2016-09-04: 500 [IU]
  Filled 2016-09-04: qty 5

## 2016-09-04 MED ORDER — SODIUM CHLORIDE 0.9 % IV SOLN
Freq: Once | INTRAVENOUS | Status: AC
  Administered 2016-09-04: 13:00:00 via INTRAVENOUS

## 2016-09-04 MED ORDER — PEGFILGRASTIM 6 MG/0.6ML ~~LOC~~ PSKT
6.0000 mg | PREFILLED_SYRINGE | Freq: Once | SUBCUTANEOUS | Status: DC
  Filled 2016-09-04: qty 0.6

## 2016-09-04 MED ORDER — ONDANSETRON HCL 4 MG/2ML IJ SOLN
8.0000 mg | Freq: Once | INTRAMUSCULAR | Status: AC
  Administered 2016-09-04: 8 mg via INTRAVENOUS

## 2016-09-04 NOTE — Patient Instructions (Signed)
[facility] Discharge Instructions for Patients Receiving Chemotherapy  Today you received the following chemotherapy agents Gemzar.  To help prevent nausea and vomiting after your treatment, we encourage you to take your nausea medication.   If you develop nausea and vomiting that is not controlled by your nausea medication, call the clinic.   BELOW ARE SYMPTOMS THAT SHOULD BE REPORTED IMMEDIATELY:  *FEVER GREATER THAN 100.5 F  *CHILLS WITH OR WITHOUT FEVER  NAUSEA AND VOMITING THAT IS NOT CONTROLLED WITH YOUR NAUSEA MEDICATION  *UNUSUAL SHORTNESS OF BREATH  *UNUSUAL BRUISING OR BLEEDING  TENDERNESS IN MOUTH AND THROAT WITH OR WITHOUT PRESENCE OF ULCERS  *URINARY PROBLEMS  *BOWEL PROBLEMS  UNUSUAL RASH Items with * indicate a potential emergency and should be followed up as soon as possible.  Feel free to call the clinic you have any questions or concerns. The clinic phone number is [phone].  Please show the CHEMO ALERT CARD at check-in to the Emergency Department and triage nurse.   

## 2016-09-04 NOTE — Telephone Encounter (Signed)
This RN was contacted by treatment room RN per abnormal levels AST and ALT per treatment today - day 8 of cycle 6 gemzar.  Per Dr Lindi Adie review of above labs may proceed with treatment.

## 2016-09-07 ENCOUNTER — Ambulatory Visit (HOSPITAL_BASED_OUTPATIENT_CLINIC_OR_DEPARTMENT_OTHER): Payer: BLUE CROSS/BLUE SHIELD

## 2016-09-07 VITALS — BP 117/80 | HR 98 | Temp 97.7°F | Resp 20

## 2016-09-07 DIAGNOSIS — C7802 Secondary malignant neoplasm of left lung: Secondary | ICD-10-CM | POA: Diagnosis not present

## 2016-09-07 DIAGNOSIS — C50912 Malignant neoplasm of unspecified site of left female breast: Secondary | ICD-10-CM

## 2016-09-07 DIAGNOSIS — C7951 Secondary malignant neoplasm of bone: Secondary | ICD-10-CM | POA: Diagnosis not present

## 2016-09-07 DIAGNOSIS — C787 Secondary malignant neoplasm of liver and intrahepatic bile duct: Secondary | ICD-10-CM

## 2016-09-07 DIAGNOSIS — Z5189 Encounter for other specified aftercare: Secondary | ICD-10-CM | POA: Diagnosis not present

## 2016-09-07 MED ORDER — TBO-FILGRASTIM 300 MCG/0.5ML ~~LOC~~ SOSY
300.0000 ug | PREFILLED_SYRINGE | Freq: Once | SUBCUTANEOUS | Status: AC
  Administered 2016-09-07: 300 ug via SUBCUTANEOUS
  Filled 2016-09-07: qty 0.5

## 2016-09-07 NOTE — Patient Instructions (Signed)
Tbo-Filgrastim injection What is this medicine? TBO-FILGRASTIM (T B O fil GRA stim) is a granulocyte colony-stimulating factor that stimulates the growth of neutrophils, a type of white blood cell important in the body's fight against infection. It is used to reduce the incidence of fever and infection in patients with certain types of cancer who are receiving chemotherapy that affects the bone marrow. COMMON BRAND NAME(S): Granix What should I tell my health care provider before I take this medicine? They need to know if you have any of these conditions: -ongoing radiation therapy -sickle cell anemia -an unusual or allergic reaction to tbo-filgrastim, filgrastim, pegfilgrastim, other medicines, foods, dyes, or preservatives -pregnant or trying to get pregnant -breast-feeding How should I use this medicine? This medicine is for injection under the skin. If you get this medicine at home, you will be taught how to prepare and give this medicine. Refer to the Instructions for Use that come with your medication packaging. Use exactly as directed. Take your medicine at regular intervals. Do not take your medicine more often than directed. It is important that you put your used needles and syringes in a special sharps container. Do not put them in a trash can. If you do not have a sharps container, call your pharmacist or healthcare provider to get one. Talk to your pediatrician regarding the use of this medicine in children. Special care may be needed. What if I miss a dose? It is important not to miss your dose. Call your doctor or health care professional if you miss a dose. What may interact with this medicine? This medicine may interact with the following medications: -medicines that may cause a release of neutrophils, such as lithium What should I watch for while using this medicine? You may need blood work done while you are taking this medicine. What side effects may I notice from receiving  this medicine? Side effects that you should report to your doctor or health care professional as soon as possible: -allergic reactions like skin rash, itching or hives, swelling of the face, lips, or tongue -shortness of breath or breathing problems -fever -pain, redness, or irritation at site where injected -pinpoint red spots on the skin -stomach or side pain, or pain at the shoulder -swelling -tiredness -trouble passing urine Side effects that usually do not require medical attention (report to your doctor or health care professional if they continue or are bothersome): -bone pain -muscle pain Where should I keep my medicine? Keep out of the reach of children. Store in a refrigerator between 2 and 8 degrees C (36 and 46 degrees F). Keep in carton to protect from light. Throw away this medicine if it is left out of the refrigerator for more than 5 consecutive days. Throw away any unused medicine after the expiration date.  2017 Elsevier/Gold Standard (2015-07-18 12:15:25)  

## 2016-09-08 ENCOUNTER — Ambulatory Visit (HOSPITAL_BASED_OUTPATIENT_CLINIC_OR_DEPARTMENT_OTHER): Payer: BLUE CROSS/BLUE SHIELD

## 2016-09-08 VITALS — BP 110/72 | HR 86 | Temp 97.0°F | Resp 18

## 2016-09-08 DIAGNOSIS — Z5189 Encounter for other specified aftercare: Secondary | ICD-10-CM

## 2016-09-08 DIAGNOSIS — C7951 Secondary malignant neoplasm of bone: Secondary | ICD-10-CM | POA: Diagnosis not present

## 2016-09-08 DIAGNOSIS — C50912 Malignant neoplasm of unspecified site of left female breast: Secondary | ICD-10-CM | POA: Diagnosis not present

## 2016-09-08 MED ORDER — TBO-FILGRASTIM 300 MCG/0.5ML ~~LOC~~ SOSY
300.0000 ug | PREFILLED_SYRINGE | Freq: Once | SUBCUTANEOUS | Status: AC
  Administered 2016-09-08: 300 ug via SUBCUTANEOUS
  Filled 2016-09-08: qty 0.5

## 2016-09-08 NOTE — Patient Instructions (Signed)
Tbo-Filgrastim injection What is this medicine? TBO-FILGRASTIM (T B O fil GRA stim) is a granulocyte colony-stimulating factor that stimulates the growth of neutrophils, a type of white blood cell important in the body's fight against infection. It is used to reduce the incidence of fever and infection in patients with certain types of cancer who are receiving chemotherapy that affects the bone marrow. COMMON BRAND NAME(S): Granix What should I tell my health care provider before I take this medicine? They need to know if you have any of these conditions: -ongoing radiation therapy -sickle cell anemia -an unusual or allergic reaction to tbo-filgrastim, filgrastim, pegfilgrastim, other medicines, foods, dyes, or preservatives -pregnant or trying to get pregnant -breast-feeding How should I use this medicine? This medicine is for injection under the skin. If you get this medicine at home, you will be taught how to prepare and give this medicine. Refer to the Instructions for Use that come with your medication packaging. Use exactly as directed. Take your medicine at regular intervals. Do not take your medicine more often than directed. It is important that you put your used needles and syringes in a special sharps container. Do not put them in a trash can. If you do not have a sharps container, call your pharmacist or healthcare provider to get one. Talk to your pediatrician regarding the use of this medicine in children. Special care may be needed. What if I miss a dose? It is important not to miss your dose. Call your doctor or health care professional if you miss a dose. What may interact with this medicine? This medicine may interact with the following medications: -medicines that may cause a release of neutrophils, such as lithium What should I watch for while using this medicine? You may need blood work done while you are taking this medicine. What side effects may I notice from receiving  this medicine? Side effects that you should report to your doctor or health care professional as soon as possible: -allergic reactions like skin rash, itching or hives, swelling of the face, lips, or tongue -shortness of breath or breathing problems -fever -pain, redness, or irritation at site where injected -pinpoint red spots on the skin -stomach or side pain, or pain at the shoulder -swelling -tiredness -trouble passing urine Side effects that usually do not require medical attention (report to your doctor or health care professional if they continue or are bothersome): -bone pain -muscle pain Where should I keep my medicine? Keep out of the reach of children. Store in a refrigerator between 2 and 8 degrees C (36 and 46 degrees F). Keep in carton to protect from light. Throw away this medicine if it is left out of the refrigerator for more than 5 consecutive days. Throw away any unused medicine after the expiration date.  2017 Elsevier/Gold Standard (2015-07-18 12:15:25)  

## 2016-09-17 ENCOUNTER — Other Ambulatory Visit: Payer: Self-pay | Admitting: *Deleted

## 2016-09-17 DIAGNOSIS — C50912 Malignant neoplasm of unspecified site of left female breast: Secondary | ICD-10-CM

## 2016-09-17 DIAGNOSIS — C7802 Secondary malignant neoplasm of left lung: Principal | ICD-10-CM

## 2016-09-18 ENCOUNTER — Other Ambulatory Visit: Payer: Self-pay | Admitting: *Deleted

## 2016-09-18 ENCOUNTER — Ambulatory Visit: Payer: BLUE CROSS/BLUE SHIELD

## 2016-09-18 ENCOUNTER — Ambulatory Visit (HOSPITAL_BASED_OUTPATIENT_CLINIC_OR_DEPARTMENT_OTHER): Payer: BLUE CROSS/BLUE SHIELD | Admitting: Oncology

## 2016-09-18 ENCOUNTER — Other Ambulatory Visit (HOSPITAL_BASED_OUTPATIENT_CLINIC_OR_DEPARTMENT_OTHER): Payer: BLUE CROSS/BLUE SHIELD

## 2016-09-18 ENCOUNTER — Ambulatory Visit (HOSPITAL_BASED_OUTPATIENT_CLINIC_OR_DEPARTMENT_OTHER): Payer: BLUE CROSS/BLUE SHIELD

## 2016-09-18 VITALS — BP 126/83 | HR 89 | Temp 97.4°F | Resp 18 | Ht 65.0 in | Wt 148.1 lb

## 2016-09-18 DIAGNOSIS — C787 Secondary malignant neoplasm of liver and intrahepatic bile duct: Secondary | ICD-10-CM

## 2016-09-18 DIAGNOSIS — Z170 Estrogen receptor positive status [ER+]: Secondary | ICD-10-CM | POA: Diagnosis not present

## 2016-09-18 DIAGNOSIS — C7951 Secondary malignant neoplasm of bone: Secondary | ICD-10-CM

## 2016-09-18 DIAGNOSIS — Z5111 Encounter for antineoplastic chemotherapy: Secondary | ICD-10-CM

## 2016-09-18 DIAGNOSIS — C50412 Malignant neoplasm of upper-outer quadrant of left female breast: Secondary | ICD-10-CM

## 2016-09-18 DIAGNOSIS — C7802 Secondary malignant neoplasm of left lung: Secondary | ICD-10-CM

## 2016-09-18 DIAGNOSIS — C50912 Malignant neoplasm of unspecified site of left female breast: Secondary | ICD-10-CM

## 2016-09-18 DIAGNOSIS — C50919 Malignant neoplasm of unspecified site of unspecified female breast: Secondary | ICD-10-CM

## 2016-09-18 DIAGNOSIS — C7949 Secondary malignant neoplasm of other parts of nervous system: Secondary | ICD-10-CM

## 2016-09-18 DIAGNOSIS — C50922 Malignant neoplasm of unspecified site of left male breast: Secondary | ICD-10-CM

## 2016-09-18 LAB — CBC WITH DIFFERENTIAL/PLATELET
BASO%: 0.4 % (ref 0.0–2.0)
BASOS ABS: 0 10*3/uL (ref 0.0–0.1)
EOS%: 1.5 % (ref 0.0–7.0)
Eosinophils Absolute: 0 10*3/uL (ref 0.0–0.5)
HCT: 38.7 % (ref 34.8–46.6)
HGB: 12.2 g/dL (ref 11.6–15.9)
LYMPH%: 22.6 % (ref 14.0–49.7)
MCH: 31.9 pg (ref 25.1–34.0)
MCHC: 31.5 g/dL (ref 31.5–36.0)
MCV: 101.3 fL — AB (ref 79.5–101.0)
MONO#: 0.4 10*3/uL (ref 0.1–0.9)
MONO%: 15.3 % — AB (ref 0.0–14.0)
NEUT#: 1.7 10*3/uL (ref 1.5–6.5)
NEUT%: 60.2 % (ref 38.4–76.8)
Platelets: 182 10*3/uL (ref 145–400)
RBC: 3.82 10*6/uL (ref 3.70–5.45)
RDW: 15.6 % — AB (ref 11.2–14.5)
WBC: 2.7 10*3/uL — ABNORMAL LOW (ref 3.9–10.3)
lymph#: 0.6 10*3/uL — ABNORMAL LOW (ref 0.9–3.3)
nRBC: 0 % (ref 0–0)

## 2016-09-18 LAB — COMPREHENSIVE METABOLIC PANEL
ALK PHOS: 149 U/L (ref 40–150)
ALT: 42 U/L (ref 0–55)
AST: 45 U/L — ABNORMAL HIGH (ref 5–34)
Albumin: 3.2 g/dL — ABNORMAL LOW (ref 3.5–5.0)
Anion Gap: 7 mEq/L (ref 3–11)
BUN: 8.8 mg/dL (ref 7.0–26.0)
CO2: 24 meq/L (ref 22–29)
Calcium: 8.5 mg/dL (ref 8.4–10.4)
Chloride: 109 mEq/L (ref 98–109)
Creatinine: 0.7 mg/dL (ref 0.6–1.1)
GLUCOSE: 107 mg/dL (ref 70–140)
Potassium: 4.1 mEq/L (ref 3.5–5.1)
SODIUM: 140 meq/L (ref 136–145)
Total Bilirubin: 0.44 mg/dL (ref 0.20–1.20)
Total Protein: 6.3 g/dL — ABNORMAL LOW (ref 6.4–8.3)

## 2016-09-18 MED ORDER — OXYCODONE HCL ER 30 MG PO T12A: 30.0000 mg | EXTENDED_RELEASE_TABLET | Freq: Two times a day (BID) | ORAL | 0 refills | Status: DC

## 2016-09-18 MED ORDER — ONDANSETRON HCL 4 MG/2ML IJ SOLN
INTRAMUSCULAR | Status: AC
  Filled 2016-09-18: qty 4

## 2016-09-18 MED ORDER — SODIUM CHLORIDE 0.9% FLUSH
10.0000 mL | INTRAVENOUS | Status: DC | PRN
  Administered 2016-09-18: 10 mL
  Filled 2016-09-18: qty 10

## 2016-09-18 MED ORDER — SODIUM CHLORIDE 0.9 % IV SOLN
600.0000 mg/m2 | Freq: Once | INTRAVENOUS | Status: AC
  Administered 2016-09-18: 1064 mg via INTRAVENOUS
  Filled 2016-09-18: qty 27.98

## 2016-09-18 MED ORDER — ONDANSETRON HCL 4 MG/2ML IJ SOLN
8.0000 mg | Freq: Once | INTRAMUSCULAR | Status: AC
  Administered 2016-09-18: 8 mg via INTRAVENOUS

## 2016-09-18 MED ORDER — SODIUM CHLORIDE 0.9 % IJ SOLN
10.0000 mL | Freq: Once | INTRAMUSCULAR | Status: AC
  Administered 2016-09-18: 10 mL
  Filled 2016-09-18: qty 10

## 2016-09-18 MED ORDER — HEPARIN SOD (PORK) LOCK FLUSH 100 UNIT/ML IV SOLN
500.0000 [IU] | Freq: Once | INTRAVENOUS | Status: AC | PRN
  Administered 2016-09-18: 500 [IU]
  Filled 2016-09-18: qty 5

## 2016-09-18 MED ORDER — SODIUM CHLORIDE 0.9 % IV SOLN
Freq: Once | INTRAVENOUS | Status: AC
  Administered 2016-09-18: 13:00:00 via INTRAVENOUS

## 2016-09-18 NOTE — Progress Notes (Signed)
[facility]  [phone] [phone]) [phone]     ID: Jill Johnston DOB: [birth year]  MR#: [id number]  VQQ#:[id number]  Patient Care Team: Jill Cruel, MD as PCP - General (Oncology) [name], MD as Consulting Physician (Radiation Oncology) [name] as Consulting Physician (Internal Medicine) [name], MD as Consulting Physician (Internal Medicine) Jill Cruel, MD OTHER MD:  CHIEF COMPLAINT: Estrogen receptor positive metastatic breast cancer  CURRENT TREATMENT: Gemcitabine, denosumab/Xgeva   BREAST CANCER HISTORY: From Dr. 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 The breast cancer was ER + 100%, PR 1%, HER 2 IHC/FISH negative, Ki67 52%, BRCA 1/2 negative, initial CA 2729 WNL. She had neoadjuvant AC x4 taxol x12 dose dense from 06-10-2007 thru 09-28-2007 by Dr B.Darovsky. 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 She had radiation to chest wall, supraclavicular region and left axilla 50.4 Gy with boost to mastectomy scar to 60.4 Gy, by Dr [name] from 12-15-2007 thru 01-31-2008. She was on tamoxifen from 02-20-2008 thru 09-25-2014. She developed pain in ribs 08-2014, with CA 2729 52 and PET CT and MRI with diffuse bone metastatic disease, no cord compression, small lung lesions and intrathoracic adenopathy. 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 She began Denosumab 120 mcg monthly starting 4-7-201, and letrozole + palbociclib beginning4-12-2014. She was seen in consultation by Dr [name] [phone]. Palbociclib dose decreased to 100 mg daily x 21 q 28 days due to neutropenia. CBC on 10-25-15: WBC 2.1, Hgb 13,  plt 186, ANC 0.9 PET 09-25-2014 in [facility] and CT CAP at [place] 10-25-15 with no clear pulmonary involvement, stable 2 cm lesion at dome of liver and 11 mm left hepatic lobe lesion. She had right tomo mamm[facility] 09-17-15, with heterogeneously dense breast tissue but no other mammographic findings of concern.  CA 2729 increased from 38 in 08-2015 to 49 in April 2017 and 65 in May 2017. CT CAP / PET 03-16-16 showed progression lung, liver and bone. Letrozole and ibrance DCd on 03-19-16, with plans to begin chemotherapy. 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 She elected whole brain RT, given by Dr [name] ~ 03-23-16 thru 04-10-16.  She had first gemzar on 04-24-16  She had evaluation for vaginal bleeding "from polyp", follows yearly with gynecologist in [place], up to date."  Her subsequent history is as detailed below.  INTERVAL HISTORY: Jill Johnston returns today for follow-up of her stage IV estrogen receptor positive breast cancer accompanied by her husband Jill Johnston. The patient is being treated with denosumab/Xgeva given every 6 weeks and also with gemcitabine given days 1 and 8 of each treatment cycle. Today she is scheduled to receive day 1 cycle 7. her.   REVIEW OF SYSTEMS: Jill Johnston feels clinically better. She has less fatigue, sleeps less after treatments, is doing some housework and some cooking. She feels a little tired after the "bone shot". She is doing a little bit of driving, for instance to [facility]. She is having some pain like needles in her abdomen, but this is very variable, does not occur some days, does not seem  to be related to bowel movements, food, or activity. Her hair is coming in a little gray. She is receiving OxyContin 30 mg twice daily and rarely requires oxycodone. The narcotics do not positive. Aside from these issues a detailed review  of systems today was stable  PAST MEDICAL HISTORY: Past Medical History:  Diagnosis Date  . Anxiety   . Breast cancer ([place])    Stage IV, ER positive left upper outer quadrant breast cancer with metastasis to bone  . Breast cancer metastasized to multiple sites ([place]) 04/12/2016   bone, liver, lung and brain  . GERD (gastroesophageal reflux disease)   . Hypertension     PAST SURGICAL HISTORY: Past Surgical History:  Procedure Laterality Date  . CHOLECYSTECTOMY    . ESOPHAGOGASTRODUODENOSCOPY (EGD) WITH PROPOFOL N/A 07/12/2015   Procedure: ESOPHAGOGASTRODUODENOSCOPY (EGD) WITH PROPOFOL;  Surgeon: Rogene Houston, MD;  Location: [facility];  Service: Endoscopy;  Laterality: N/A;  1:00  . IR GENERIC HISTORICAL  04/22/2016   IR US GUIDE VASC ACCESS RIGHT 04/22/2016 Markus Daft, MD [facility]-INTERV RAD  . IR GENERIC HISTORICAL  04/22/2016   IR FLUORO GUIDE PORT INSERTION RIGHT 04/22/2016 Markus Daft, MD [facility]-INTER[facility] RAD  . MASTECTOMY  April 2009   Left breast with lymph node resection    FAMILY HISTORY Family History  Problem Relation Age of Onset  . Heart attack Mother     Died age 50  . Heart disease Father     Died age 21  . Brain cancer Father     pt not sure if he actually had this  . Cancer Maternal Grandmother     dx. NOS cancer at older age; d. 59  . Heart attack Maternal Grandfather 79  . Colon cancer Paternal Grandmother     d. 64s  The patient's father died at age 62 she believes from heart disease. The patient's mother died at age 19 also from a myocardial infarction. The patient had one brother, no sisters. There is no history of breast or ovarian cancer in the family despite extensive G neurologic research   GYNECOLOGIC HISTORY:  No LMP recorded. Patient is not currently having periods (Reason: Chemotherapy).  menarche age 28, first live birth age 5, the patient is GX P1. She stopped having periods with chemotherapy in 2009 and these have not resumed.   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 They recently moved to [place] from [place]. Their son Jill Johnston is 83 years old as of November 2017 . The patient is [place] Orthodox      ADVANCED DIRECTIVES:  the patient's husband is her healthcare power of attorney. There is no living will in place    HEALTH MAINTENANCE: Social History  Substance Use Topics  . Smoking status: Never Smoker  . Smokeless tobacco: Never Used  . Alcohol use No     Colonoscopy:  PAP:  Bone density:   No Known Allergies  Current Outpatient Prescriptions  Medication Sig Dispense Refill  . docusate sodium (COLACE) 100 MG capsule Take 100 mg by mouth daily.     . fluconazole (DIFLUCAN) 100 MG tablet Take 1 tablet (100 mg total) by mouth daily as needed (for thrush). 10 tablet 0  . ibuprofen (ADVIL,MOTRIN) 200 MG tablet Take 400 mg by mouth daily as needed.    . lidocaine-prilocaine (EMLA) cream Apply 1 application topically as needed. To  port 1 hour before going to be accessed with needle. Cover with plastic wrap. 30 g 1  . lisinopril (PRINIVIL,ZESTRIL) 10 MG tablet Take 1 tablet (10 mg total) by mouth daily. 30 tablet 0  . oxyCODONE (OXYCONTIN) 30 MG 12 hr tablet Take 30 mg by mouth 2 (two) times daily. 60 each 0  . Oxycodone HCl 10 MG TABS Take 1 tablet (10 mg total) by mouth every 3 (three) hours as needed. 60 tablet 0  . pantoprazole (PROTONIX) 40 MG tablet TAKE 1 TABLET(40 MG) BY MOUTH TWICE DAILY BEFORE A MEAL 60 tablet 5  . polyethylene glycol (MIRALAX / GLYCOLAX) packet Take 17 grams by mouth every day. 14 each 6   No current facility-administered medications for this visit.     OBJECTIVE: Middle-aged white woman Who appears stated age  3:   09/18/16 1149  BP: 126/83  Pulse: 89  Resp: 18  Temp: 97.4 F (36.3 C)     Body mass index is 24.65 kg/m.    ECOG FS:1 - Symptomatic but completely  ambulatory  Sclerae unicteric, EOMs intact Oropharynx clear and moist No cervical or supraclavicular adenopathy Lungs no rales or rhonchi Heart regular rate and rhythm Abd soft, nontender, positive bowel sounds MSK no focal spinal tenderness, no upper extremity lymphedema Neuro: nonfocal, well oriented, appropriate affect Breasts: Deferred    LAB RESULTS:  CMP     Component Value Date/Time   NA 140 09/18/2016 1108   K 4.1 09/18/2016 1108   CL 105 05/20/2016 0930   CO2 24 09/18/2016 1108   GLUCOSE 107 09/18/2016 1108   BUN 8.8 09/18/2016 1108   CREATININE 0.7 09/18/2016 1108   CALCIUM 8.5 09/18/2016 1108   PROT 6.3 (L) 09/18/2016 1108   ALBUMIN 3.2 (L) 09/18/2016 1108   AST 45 (H) 09/18/2016 1108   ALT 42 09/18/2016 1108   ALKPHOS 149 09/18/2016 1108   BILITOT 0.44 09/18/2016 1108   GFRNONAA >60 05/20/2016 0930   GFRAA >60 05/20/2016 0930    INo results found for: SPEP, UPEP  Lab Results  Component Value Date   WBC 2.7 (L) 09/18/2016   NEUTROABS 1.7 09/18/2016   HGB 12.2 09/18/2016   HCT 38.7 09/18/2016   MCV 101.3 (H) 09/18/2016   PLT 182 09/18/2016      Chemistry      Component Value Date/Time   NA 140 09/18/2016 1108   K 4.1 09/18/2016 1108   CL 105 05/20/2016 0930   CO2 24 09/18/2016 1108   BUN 8.8 09/18/2016 1108   CREATININE 0.7 09/18/2016 1108      Component Value Date/Time   CALCIUM 8.5 09/18/2016 1108   ALKPHOS 149 09/18/2016 1108   AST 45 (H) 09/18/2016 1108   ALT 42 09/18/2016 1108   BILITOT 0.44 09/18/2016 1108       Lab Results  Component Value Date   LABCA2 98 (H) 02/27/2016    No components found for: [id number]  No results for input(s): INR in the last 168 hours.  Urinalysis    Component Value Date/Time   COLORURINE ORANGE (A) 05/17/2016 0852   APPEARANCEUR CLOUDY (A) 05/17/2016 0852   LABSPEC 1.027 05/17/2016 0852   PHURINE 6.0 05/17/2016 0852   GLUCOSEU NEGATIVE 05/17/2016 0852   HGBUR NEGATIVE 05/17/2016 0852    BILIRUBINUR SMALL (A) 05/17/2016 0852   KETONESUR NEGATIVE 05/17/2016 0852   PROTEINUR 30 (A) 05/17/2016 0852   NITRITE NEGATIVE 05/17/2016 0852   LEUKOCYTESUR TRACE (A) 05/17/2016 0539  Ref Range & Units 3wk ago 31moago 227mogo 11m711moo   CA 27.29 0.0 - 38.6 U/mL 48.1   49.7CM   54.3CM   178.8CM    Comments: [facility]       STUDIES: No results found.  ELIGIBLE FOR AVAILABLE RESEARCH PROTOCOL: no  ASSESSMENT: [age]o. BRCA negative [place] status post left breast upper inner quadrant biopsy 05/17/2007 for a clinical mT2 N2, stage IIIA invasive ductal breast cancer, strongly estrogen receptor positive, weakly progesterone receptor positive, HER-2 negative, with an MIB-1 of 52%  (2) neoadjuvant chemotherapy consisted of dose dense doxorubicin and cyclophosphamide 4 followed by weekly paclitaxel 12, started 06/10/2007, completed 09/28/2007  (3) status [id number] invasive ductal carcinoma, grade 2, pT2, pN1a (downstaged to IIB), estrogen receptor strongly positive, progesterone receptor weakly positive, HER-2 not amplified by immunohistochemistry or FISH with a signals ratio of 0.94, number per cell 3.4  (4) status post adjuvant radiation to the left chest wall as well as the left supraclavicular and axillary nodal regions (50.4 gray +10 Gray boost) given between 12/15/2007 on 01/31/2008  (5) on adjuvant tamoxifen 02/20/2008 through March 2016  METASTATIC DISEASE: March 2016, presenting with bone pain (6) staging studies April 2016 showed diffuse bony metastatic disease, intrathoracic adenopathy, and small lung lesions.  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  (a) brain MRI 07/16/2016 interpreted as requiring only further follow-up  (10) repeat genetic testing 04/23/2016 [facility] by [facility] no deleterious mutations in APC, ATM, BARD1, BMPR1A, BRCA1, BRCA2, BRIP1, CHD1, CDK4, CDKN2A, CHEK2, EPCAM (large rearrangement only), GREM1/SCG5, MLH1, MSH2, MSH6, MUTYH, NBN, PALB2, PMS2, POLD1, POLE, PTEN, RAD51C, RAD51D, SMAD4, STK11, and TP53  (11) gemcitabine started 04/24/2016,  given days 1 and 8 of each 21 day cycle  (a) day 8 cycle 1 delayed because of cytopenias--dose reduced and neupogen added  (b) CT scan of the chest 07/16/2016 shows decrease in the size of liver metastases.   PLAN: I spent approximately 30 minutes with MarJuliann Muleith most of that time spent discussing her prognosis and treatment plan. At this point she is tolerating the gemcitabine and Xgeva well and there is some clinical evidence of response. The decline in her CA-27-29 has plateaued. She is already scheduled for brain MRI in April, and we will obtain CT scan of the chest after the brain MRI results are available.  We were able to obtain on Pro for her and I was not aware that she had refused it on day 8 of her sixth cycle of treatment. Apparently it was not properly explained to her. I did explain it in great detail to her today but the whole idea of something giving her a shot the next day was uncomfortable for her and so she does not wish to have OnPro. She prefers to have Neupogen on days  4 and 5 after each Gemzar treatment  even though that means a long drive for her just to get here for a shot  Accordingly we are arranging that for her.  She sees [place] at the beginning of each cycle. The first day of cycle 8 I will be out of town but she will see our physicians assistant. Assuming no untoward events she will proceed with treatment and then see me at the beginning of cycle 9 and before that she will have a chest CT scan, tentatively scheduled already for 10/27/2016  They know to call for any problems that may develop before her next visit here.   Jill Cruel, MD   09/19/2016 3:06 PM Medical Oncology and Hematology [facility] [address] [place], [place] [address] Tel. [phone]    Fax. [phone]

## 2016-09-19 LAB — CANCER ANTIGEN 27.29: CAN 27.29: 52.4 U/mL — AB (ref 0.0–38.6)

## 2016-09-21 ENCOUNTER — Ambulatory Visit (HOSPITAL_BASED_OUTPATIENT_CLINIC_OR_DEPARTMENT_OTHER): Payer: BLUE CROSS/BLUE SHIELD

## 2016-09-21 VITALS — BP 116/85 | HR 100 | Temp 97.7°F | Resp 20

## 2016-09-21 DIAGNOSIS — C787 Secondary malignant neoplasm of liver and intrahepatic bile duct: Secondary | ICD-10-CM

## 2016-09-21 DIAGNOSIS — C50912 Malignant neoplasm of unspecified site of left female breast: Secondary | ICD-10-CM | POA: Diagnosis not present

## 2016-09-21 DIAGNOSIS — C7951 Secondary malignant neoplasm of bone: Secondary | ICD-10-CM | POA: Diagnosis not present

## 2016-09-21 DIAGNOSIS — C7931 Secondary malignant neoplasm of brain: Secondary | ICD-10-CM | POA: Diagnosis not present

## 2016-09-21 DIAGNOSIS — C7802 Secondary malignant neoplasm of left lung: Secondary | ICD-10-CM | POA: Diagnosis not present

## 2016-09-21 MED ORDER — TBO-FILGRASTIM 300 MCG/0.5ML ~~LOC~~ SOSY
300.0000 ug | PREFILLED_SYRINGE | Freq: Once | SUBCUTANEOUS | Status: AC
  Administered 2016-09-21: 300 ug via SUBCUTANEOUS
  Filled 2016-09-21: qty 0.5

## 2016-09-21 NOTE — Patient Instructions (Signed)
Tbo-Filgrastim injection What is this medicine? TBO-FILGRASTIM (T B O fil GRA stim) is a granulocyte colony-stimulating factor that stimulates the growth of neutrophils, a type of white blood cell important in the body's fight against infection. It is used to reduce the incidence of fever and infection in patients with certain types of cancer who are receiving chemotherapy that affects the bone marrow. COMMON BRAND NAME(S): Granix What should I tell my health care provider before I take this medicine? They need to know if you have any of these conditions: -ongoing radiation therapy -sickle cell anemia -an unusual or allergic reaction to tbo-filgrastim, filgrastim, pegfilgrastim, other medicines, foods, dyes, or preservatives -pregnant or trying to get pregnant -breast-feeding How should I use this medicine? This medicine is for injection under the skin. If you get this medicine at home, you will be taught how to prepare and give this medicine. Refer to the Instructions for Use that come with your medication packaging. Use exactly as directed. Take your medicine at regular intervals. Do not take your medicine more often than directed. It is important that you put your used needles and syringes in a special sharps container. Do not put them in a trash can. If you do not have a sharps container, call your pharmacist or healthcare provider to get one. Talk to your pediatrician regarding the use of this medicine in children. Special care may be needed. What if I miss a dose? It is important not to miss your dose. Call your doctor or health care professional if you miss a dose. What may interact with this medicine? This medicine may interact with the following medications: -medicines that may cause a release of neutrophils, such as lithium What should I watch for while using this medicine? You may need blood work done while you are taking this medicine. What side effects may I notice from receiving  this medicine? Side effects that you should report to your doctor or health care professional as soon as possible: -allergic reactions like skin rash, itching or hives, swelling of the face, lips, or tongue -shortness of breath or breathing problems -fever -pain, redness, or irritation at site where injected -pinpoint red spots on the skin -stomach or side pain, or pain at the shoulder -swelling -tiredness -trouble passing urine Side effects that usually do not require medical attention (report to your doctor or health care professional if they continue or are bothersome): -bone pain -muscle pain Where should I keep my medicine? Keep out of the reach of children. Store in a refrigerator between 2 and 8 degrees C (36 and 46 degrees F). Keep in carton to protect from light. Throw away this medicine if it is left out of the refrigerator for more than 5 consecutive days. Throw away any unused medicine after the expiration date.  2017 Elsevier/Gold Standard (2015-07-18 12:15:25)  

## 2016-09-22 ENCOUNTER — Ambulatory Visit (HOSPITAL_BASED_OUTPATIENT_CLINIC_OR_DEPARTMENT_OTHER): Payer: BLUE CROSS/BLUE SHIELD

## 2016-09-22 VITALS — BP 110/85 | HR 100 | Temp 97.3°F | Resp 18

## 2016-09-22 DIAGNOSIS — Z5189 Encounter for other specified aftercare: Secondary | ICD-10-CM | POA: Diagnosis not present

## 2016-09-22 DIAGNOSIS — C50912 Malignant neoplasm of unspecified site of left female breast: Secondary | ICD-10-CM | POA: Diagnosis not present

## 2016-09-22 DIAGNOSIS — C787 Secondary malignant neoplasm of liver and intrahepatic bile duct: Secondary | ICD-10-CM

## 2016-09-22 DIAGNOSIS — C7802 Secondary malignant neoplasm of left lung: Secondary | ICD-10-CM | POA: Diagnosis not present

## 2016-09-22 DIAGNOSIS — C7951 Secondary malignant neoplasm of bone: Secondary | ICD-10-CM | POA: Diagnosis not present

## 2016-09-22 DIAGNOSIS — C7931 Secondary malignant neoplasm of brain: Secondary | ICD-10-CM | POA: Diagnosis not present

## 2016-09-22 MED ORDER — TBO-FILGRASTIM 300 MCG/0.5ML ~~LOC~~ SOSY
300.0000 ug | PREFILLED_SYRINGE | Freq: Once | SUBCUTANEOUS | Status: AC
  Administered 2016-09-22: 300 ug via SUBCUTANEOUS
  Filled 2016-09-22: qty 0.5

## 2016-09-22 NOTE — Patient Instructions (Signed)
Tbo-Filgrastim injection What is this medicine? TBO-FILGRASTIM (T B O fil GRA stim) is a granulocyte colony-stimulating factor that stimulates the growth of neutrophils, a type of white blood cell important in the body's fight against infection. It is used to reduce the incidence of fever and infection in patients with certain types of cancer who are receiving chemotherapy that affects the bone marrow. COMMON BRAND NAME(S): Granix What should I tell my health care provider before I take this medicine? They need to know if you have any of these conditions: -ongoing radiation therapy -sickle cell anemia -an unusual or allergic reaction to tbo-filgrastim, filgrastim, pegfilgrastim, other medicines, foods, dyes, or preservatives -pregnant or trying to get pregnant -breast-feeding How should I use this medicine? This medicine is for injection under the skin. If you get this medicine at home, you will be taught how to prepare and give this medicine. Refer to the Instructions for Use that come with your medication packaging. Use exactly as directed. Take your medicine at regular intervals. Do not take your medicine more often than directed. It is important that you put your used needles and syringes in a special sharps container. Do not put them in a trash can. If you do not have a sharps container, call your pharmacist or healthcare provider to get one. Talk to your pediatrician regarding the use of this medicine in children. Special care may be needed. What if I miss a dose? It is important not to miss your dose. Call your doctor or health care professional if you miss a dose. What may interact with this medicine? This medicine may interact with the following medications: -medicines that may cause a release of neutrophils, such as lithium What should I watch for while using this medicine? You may need blood work done while you are taking this medicine. What side effects may I notice from receiving  this medicine? Side effects that you should report to your doctor or health care professional as soon as possible: -allergic reactions like skin rash, itching or hives, swelling of the face, lips, or tongue -shortness of breath or breathing problems -fever -pain, redness, or irritation at site where injected -pinpoint red spots on the skin -stomach or side pain, or pain at the shoulder -swelling -tiredness -trouble passing urine Side effects that usually do not require medical attention (report to your doctor or health care professional if they continue or are bothersome): -bone pain -muscle pain Where should I keep my medicine? Keep out of the reach of children. Store in a refrigerator between 2 and 8 degrees C (36 and 46 degrees F). Keep in carton to protect from light. Throw away this medicine if it is left out of the refrigerator for more than 5 consecutive days. Throw away any unused medicine after the expiration date.  2017 Elsevier/Gold Standard (2015-07-18 12:15:25)  

## 2016-09-25 ENCOUNTER — Ambulatory Visit (HOSPITAL_BASED_OUTPATIENT_CLINIC_OR_DEPARTMENT_OTHER): Payer: BLUE CROSS/BLUE SHIELD

## 2016-09-25 ENCOUNTER — Ambulatory Visit: Payer: BLUE CROSS/BLUE SHIELD

## 2016-09-25 ENCOUNTER — Other Ambulatory Visit (HOSPITAL_BASED_OUTPATIENT_CLINIC_OR_DEPARTMENT_OTHER): Payer: BLUE CROSS/BLUE SHIELD

## 2016-09-25 DIAGNOSIS — C7949 Secondary malignant neoplasm of other parts of nervous system: Secondary | ICD-10-CM

## 2016-09-25 DIAGNOSIS — C787 Secondary malignant neoplasm of liver and intrahepatic bile duct: Secondary | ICD-10-CM | POA: Diagnosis not present

## 2016-09-25 DIAGNOSIS — C7951 Secondary malignant neoplasm of bone: Secondary | ICD-10-CM

## 2016-09-25 DIAGNOSIS — Z5111 Encounter for antineoplastic chemotherapy: Secondary | ICD-10-CM

## 2016-09-25 DIAGNOSIS — C7802 Secondary malignant neoplasm of left lung: Secondary | ICD-10-CM

## 2016-09-25 DIAGNOSIS — C50919 Malignant neoplasm of unspecified site of unspecified female breast: Secondary | ICD-10-CM

## 2016-09-25 DIAGNOSIS — C50922 Malignant neoplasm of unspecified site of left male breast: Secondary | ICD-10-CM

## 2016-09-25 DIAGNOSIS — C7931 Secondary malignant neoplasm of brain: Secondary | ICD-10-CM

## 2016-09-25 DIAGNOSIS — Z95828 Presence of other vascular implants and grafts: Secondary | ICD-10-CM

## 2016-09-25 DIAGNOSIS — C50912 Malignant neoplasm of unspecified site of left female breast: Secondary | ICD-10-CM

## 2016-09-25 LAB — COMPREHENSIVE METABOLIC PANEL
ALT: 53 U/L (ref 0–55)
AST: 62 U/L — ABNORMAL HIGH (ref 5–34)
Albumin: 3.2 g/dL — ABNORMAL LOW (ref 3.5–5.0)
Alkaline Phosphatase: 148 U/L (ref 40–150)
Anion Gap: 7 mEq/L (ref 3–11)
BUN: 6.4 mg/dL — ABNORMAL LOW (ref 7.0–26.0)
CHLORIDE: 108 meq/L (ref 98–109)
CO2: 24 meq/L (ref 22–29)
Calcium: 8.2 mg/dL — ABNORMAL LOW (ref 8.4–10.4)
Creatinine: 0.7 mg/dL (ref 0.6–1.1)
GLUCOSE: 114 mg/dL (ref 70–140)
POTASSIUM: 3.8 meq/L (ref 3.5–5.1)
SODIUM: 139 meq/L (ref 136–145)
TOTAL PROTEIN: 6.4 g/dL (ref 6.4–8.3)
Total Bilirubin: 0.39 mg/dL (ref 0.20–1.20)

## 2016-09-25 LAB — CBC WITH DIFFERENTIAL/PLATELET
BASO%: 1.7 % (ref 0.0–2.0)
BASOS ABS: 0.1 10*3/uL (ref 0.0–0.1)
EOS%: 0.6 % (ref 0.0–7.0)
Eosinophils Absolute: 0 10*3/uL (ref 0.0–0.5)
HCT: 38 % (ref 34.8–46.6)
HGB: 11.9 g/dL (ref 11.6–15.9)
LYMPH%: 20.4 % (ref 14.0–49.7)
MCH: 31.5 pg (ref 25.1–34.0)
MCHC: 31.3 g/dL — ABNORMAL LOW (ref 31.5–36.0)
MCV: 100.5 fL (ref 79.5–101.0)
MONO#: 1 10*3/uL — ABNORMAL HIGH (ref 0.1–0.9)
MONO%: 21.9 % — AB (ref 0.0–14.0)
NEUT#: 2.6 10*3/uL (ref 1.5–6.5)
NEUT%: 55.4 % (ref 38.4–76.8)
NRBC: 0 % (ref 0–0)
Platelets: 113 10*3/uL — ABNORMAL LOW (ref 145–400)
RBC: 3.78 10*6/uL (ref 3.70–5.45)
RDW: 15.6 % — AB (ref 11.2–14.5)
WBC: 4.7 10*3/uL (ref 3.9–10.3)
lymph#: 1 10*3/uL (ref 0.9–3.3)

## 2016-09-25 MED ORDER — ONDANSETRON HCL 4 MG/2ML IJ SOLN
8.0000 mg | Freq: Once | INTRAMUSCULAR | Status: AC
  Administered 2016-09-25: 8 mg via INTRAVENOUS

## 2016-09-25 MED ORDER — SODIUM CHLORIDE 0.9% FLUSH
10.0000 mL | INTRAVENOUS | Status: DC | PRN
  Administered 2016-09-25: 10 mL
  Filled 2016-09-25: qty 10

## 2016-09-25 MED ORDER — SODIUM CHLORIDE 0.9 % IV SOLN
600.0000 mg/m2 | Freq: Once | INTRAVENOUS | Status: AC
  Administered 2016-09-25: 1064 mg via INTRAVENOUS
  Filled 2016-09-25: qty 27.98

## 2016-09-25 MED ORDER — HEPARIN SOD (PORK) LOCK FLUSH 100 UNIT/ML IV SOLN
500.0000 [IU] | Freq: Once | INTRAVENOUS | Status: AC | PRN
  Administered 2016-09-25: 500 [IU]
  Filled 2016-09-25: qty 5

## 2016-09-25 MED ORDER — SODIUM CHLORIDE 0.9% FLUSH
10.0000 mL | Freq: Once | INTRAVENOUS | Status: AC
  Administered 2016-09-25: 10 mL
  Filled 2016-09-25: qty 10

## 2016-09-25 MED ORDER — SODIUM CHLORIDE 0.9 % IV SOLN
Freq: Once | INTRAVENOUS | Status: AC
  Administered 2016-09-25: 13:00:00 via INTRAVENOUS

## 2016-09-25 MED ORDER — ONDANSETRON HCL 4 MG/2ML IJ SOLN
INTRAMUSCULAR | Status: AC
  Filled 2016-09-25: qty 4

## 2016-09-25 NOTE — Patient Instructions (Signed)
[facility] Discharge Instructions for Patients Receiving Chemotherapy  Today you received the following chemotherapy agents Gemzar.  To help prevent nausea and vomiting after your treatment, we encourage you to take your nausea medication.   If you develop nausea and vomiting that is not controlled by your nausea medication, call the clinic.   BELOW ARE SYMPTOMS THAT SHOULD BE REPORTED IMMEDIATELY:  *FEVER GREATER THAN 100.5 F  *CHILLS WITH OR WITHOUT FEVER  NAUSEA AND VOMITING THAT IS NOT CONTROLLED WITH YOUR NAUSEA MEDICATION  *UNUSUAL SHORTNESS OF BREATH  *UNUSUAL BRUISING OR BLEEDING  TENDERNESS IN MOUTH AND THROAT WITH OR WITHOUT PRESENCE OF ULCERS  *URINARY PROBLEMS  *BOWEL PROBLEMS  UNUSUAL RASH Items with * indicate a potential emergency and should be followed up as soon as possible.  Feel free to call the clinic you have any questions or concerns. The clinic phone number is [phone].  Please show the CHEMO ALERT CARD at check-in to the Emergency Department and triage nurse.   

## 2016-09-28 ENCOUNTER — Ambulatory Visit (HOSPITAL_BASED_OUTPATIENT_CLINIC_OR_DEPARTMENT_OTHER): Payer: BLUE CROSS/BLUE SHIELD

## 2016-09-28 ENCOUNTER — Other Ambulatory Visit: Payer: Self-pay | Admitting: Oncology

## 2016-09-28 VITALS — BP 119/79 | HR 79 | Temp 97.8°F | Resp 18

## 2016-09-28 DIAGNOSIS — C7802 Secondary malignant neoplasm of left lung: Secondary | ICD-10-CM | POA: Diagnosis not present

## 2016-09-28 DIAGNOSIS — Z5189 Encounter for other specified aftercare: Secondary | ICD-10-CM

## 2016-09-28 DIAGNOSIS — C7931 Secondary malignant neoplasm of brain: Secondary | ICD-10-CM

## 2016-09-28 DIAGNOSIS — C50912 Malignant neoplasm of unspecified site of left female breast: Secondary | ICD-10-CM | POA: Diagnosis not present

## 2016-09-28 DIAGNOSIS — C787 Secondary malignant neoplasm of liver and intrahepatic bile duct: Secondary | ICD-10-CM

## 2016-09-28 DIAGNOSIS — C7951 Secondary malignant neoplasm of bone: Principal | ICD-10-CM

## 2016-09-28 MED ORDER — TBO-FILGRASTIM 300 MCG/0.5ML ~~LOC~~ SOSY
300.0000 ug | PREFILLED_SYRINGE | Freq: Once | SUBCUTANEOUS | Status: AC
  Administered 2016-09-28: 300 ug via SUBCUTANEOUS
  Filled 2016-09-28: qty 0.5

## 2016-09-28 NOTE — Patient Instructions (Signed)
Tbo-Filgrastim injection What is this medicine? TBO-FILGRASTIM (T B O fil GRA stim) is a granulocyte colony-stimulating factor that stimulates the growth of neutrophils, a type of white blood cell important in the body's fight against infection. It is used to reduce the incidence of fever and infection in patients with certain types of cancer who are receiving chemotherapy that affects the bone marrow. This medicine may be used for other purposes; ask your health care provider or pharmacist if you have questions. COMMON BRAND NAME(S): Granix What should I tell my health care provider before I take this medicine? They need to know if you have any of these conditions: -bone scan or tests planned -kidney disease -sickle cell anemia -an unusual or allergic reaction to tbo-filgrastim, filgrastim, pegfilgrastim, other medicines, foods, dyes, or preservatives -pregnant or trying to get pregnant -breast-feeding How should I use this medicine? This medicine is for injection under the skin. If you get this medicine at home, you will be taught how to prepare and give this medicine. Refer to the Instructions for Use that come with your medication packaging. Use exactly as directed. Take your medicine at regular intervals. Do not take your medicine more often than directed. It is important that you put your used needles and syringes in a special sharps container. Do not put them in a trash can. If you do not have a sharps container, call your pharmacist or healthcare provider to get one. Talk to your pediatrician regarding the use of this medicine in children. Special care may be needed. Overdosage: If you think you have taken too much of this medicine contact a poison control center or emergency room at once. NOTE: This medicine is only for you. Do not share this medicine with others. What if I miss a dose? It is important not to miss your dose. Call your doctor or health care professional if you miss a  dose. What may interact with this medicine? This medicine may interact with the following medications: -medicines that may cause a release of neutrophils, such as lithium This list may not describe all possible interactions. Give your health care provider a list of all the medicines, herbs, non-prescription drugs, or dietary supplements you use. Also tell them if you smoke, drink alcohol, or use illegal drugs. Some items may interact with your medicine. What should I watch for while using this medicine? You may need blood work done while you are taking this medicine. What side effects may I notice from receiving this medicine? Side effects that you should report to your doctor or health care professional as soon as possible: -allergic reactions like skin rash, itching or hives, swelling of the face, lips, or tongue -blood in the urine -dark urine -dizziness -fast heartbeat -feeling faint -shortness of breath or breathing problems -signs and symptoms of infection like fever or chills; cough; or sore throat -signs and symptoms of kidney injury like trouble passing urine or change in the amount of urine -stomach or side pain, or pain at the shoulder -sweating -swelling of the legs, ankles, or abdomen -tiredness Side effects that usually do not require medical attention (report to your doctor or health care professional if they continue or are bothersome): -bone pain -headache -muscle pain -vomiting This list may not describe all possible side effects. Call your doctor for medical advice about side effects. You may report side effects to FDA at 1-800-FDA-1088. Where should I keep my medicine? Keep out of the reach of children. Store in a refrigerator between   2 and 8 degrees C (36 and 46 degrees F). Keep in carton to protect from light. Throw away this medicine if it is left out of the refrigerator for more than 5 consecutive days. Throw away any unused medicine after the expiration  date. NOTE: This sheet is a summary. It may not cover all possible information. If you have questions about this medicine, talk to your doctor, pharmacist, or health care provider.  2018 Elsevier/Gold Standard (2015-08-05 19:07:04)  

## 2016-09-29 ENCOUNTER — Ambulatory Visit (HOSPITAL_BASED_OUTPATIENT_CLINIC_OR_DEPARTMENT_OTHER): Payer: BLUE CROSS/BLUE SHIELD

## 2016-09-29 VITALS — BP 127/77 | HR 95 | Temp 97.7°F | Resp 20

## 2016-09-29 DIAGNOSIS — Z5189 Encounter for other specified aftercare: Secondary | ICD-10-CM | POA: Diagnosis not present

## 2016-09-29 DIAGNOSIS — C7951 Secondary malignant neoplasm of bone: Secondary | ICD-10-CM | POA: Diagnosis not present

## 2016-09-29 DIAGNOSIS — C50912 Malignant neoplasm of unspecified site of left female breast: Secondary | ICD-10-CM

## 2016-09-29 DIAGNOSIS — C787 Secondary malignant neoplasm of liver and intrahepatic bile duct: Secondary | ICD-10-CM | POA: Diagnosis not present

## 2016-09-29 DIAGNOSIS — C7802 Secondary malignant neoplasm of left lung: Secondary | ICD-10-CM

## 2016-09-29 MED ORDER — TBO-FILGRASTIM 300 MCG/0.5ML ~~LOC~~ SOSY
300.0000 ug | PREFILLED_SYRINGE | Freq: Once | SUBCUTANEOUS | Status: AC
  Administered 2016-09-29: 300 ug via SUBCUTANEOUS
  Filled 2016-09-29: qty 0.5

## 2016-09-29 NOTE — Patient Instructions (Signed)
Tbo-Filgrastim injection What is this medicine? TBO-FILGRASTIM (T B O fil GRA stim) is a granulocyte colony-stimulating factor that stimulates the growth of neutrophils, a type of white blood cell important in the body's fight against infection. It is used to reduce the incidence of fever and infection in patients with certain types of cancer who are receiving chemotherapy that affects the bone marrow. This medicine may be used for other purposes; ask your health care provider or pharmacist if you have questions. COMMON BRAND NAME(S): Granix What should I tell my health care provider before I take this medicine? They need to know if you have any of these conditions: -bone scan or tests planned -kidney disease -sickle cell anemia -an unusual or allergic reaction to tbo-filgrastim, filgrastim, pegfilgrastim, other medicines, foods, dyes, or preservatives -pregnant or trying to get pregnant -breast-feeding How should I use this medicine? This medicine is for injection under the skin. If you get this medicine at home, you will be taught how to prepare and give this medicine. Refer to the Instructions for Use that come with your medication packaging. Use exactly as directed. Take your medicine at regular intervals. Do not take your medicine more often than directed. It is important that you put your used needles and syringes in a special sharps container. Do not put them in a trash can. If you do not have a sharps container, call your pharmacist or healthcare provider to get one. Talk to your pediatrician regarding the use of this medicine in children. Special care may be needed. Overdosage: If you think you have taken too much of this medicine contact a poison control center or emergency room at once. NOTE: This medicine is only for you. Do not share this medicine with others. What if I miss a dose? It is important not to miss your dose. Call your doctor or health care professional if you miss a  dose. What may interact with this medicine? This medicine may interact with the following medications: -medicines that may cause a release of neutrophils, such as lithium This list may not describe all possible interactions. Give your health care provider a list of all the medicines, herbs, non-prescription drugs, or dietary supplements you use. Also tell them if you smoke, drink alcohol, or use illegal drugs. Some items may interact with your medicine. What should I watch for while using this medicine? You may need blood work done while you are taking this medicine. What side effects may I notice from receiving this medicine? Side effects that you should report to your doctor or health care professional as soon as possible: -allergic reactions like skin rash, itching or hives, swelling of the face, lips, or tongue -blood in the urine -dark urine -dizziness -fast heartbeat -feeling faint -shortness of breath or breathing problems -signs and symptoms of infection like fever or chills; cough; or sore throat -signs and symptoms of kidney injury like trouble passing urine or change in the amount of urine -stomach or side pain, or pain at the shoulder -sweating -swelling of the legs, ankles, or abdomen -tiredness Side effects that usually do not require medical attention (report to your doctor or health care professional if they continue or are bothersome): -bone pain -headache -muscle pain -vomiting This list may not describe all possible side effects. Call your doctor for medical advice about side effects. You may report side effects to FDA at 1-800-FDA-1088. Where should I keep my medicine? Keep out of the reach of children. Store in a refrigerator between   2 and 8 degrees C (36 and 46 degrees F). Keep in carton to protect from light. Throw away this medicine if it is left out of the refrigerator for more than 5 consecutive days. Throw away any unused medicine after the expiration  date. NOTE: This sheet is a summary. It may not cover all possible information. If you have questions about this medicine, talk to your doctor, pharmacist, or health care provider.  2018 Elsevier/Gold Standard (2015-08-05 19:07:04)  

## 2016-10-08 ENCOUNTER — Telehealth: Payer: Self-pay | Admitting: Oncology

## 2016-10-08 NOTE — Telephone Encounter (Signed)
Needs to change appointment on 4/23 and 4/24 to after 4/25 patient will be out of town and will return late Wedensday

## 2016-10-09 ENCOUNTER — Other Ambulatory Visit: Payer: Self-pay

## 2016-10-09 ENCOUNTER — Ambulatory Visit: Payer: BLUE CROSS/BLUE SHIELD

## 2016-10-09 ENCOUNTER — Ambulatory Visit (HOSPITAL_BASED_OUTPATIENT_CLINIC_OR_DEPARTMENT_OTHER): Payer: BLUE CROSS/BLUE SHIELD

## 2016-10-09 ENCOUNTER — Ambulatory Visit (HOSPITAL_BASED_OUTPATIENT_CLINIC_OR_DEPARTMENT_OTHER): Payer: BLUE CROSS/BLUE SHIELD | Admitting: Adult Health

## 2016-10-09 ENCOUNTER — Encounter: Payer: Self-pay | Admitting: Adult Health

## 2016-10-09 ENCOUNTER — Other Ambulatory Visit (HOSPITAL_BASED_OUTPATIENT_CLINIC_OR_DEPARTMENT_OTHER): Payer: BLUE CROSS/BLUE SHIELD

## 2016-10-09 VITALS — BP 124/86 | HR 94 | Temp 98.2°F | Resp 18 | Ht 65.0 in | Wt 146.5 lb

## 2016-10-09 DIAGNOSIS — Z5111 Encounter for antineoplastic chemotherapy: Secondary | ICD-10-CM

## 2016-10-09 DIAGNOSIS — C50912 Malignant neoplasm of unspecified site of left female breast: Secondary | ICD-10-CM

## 2016-10-09 DIAGNOSIS — C50922 Malignant neoplasm of unspecified site of left male breast: Secondary | ICD-10-CM

## 2016-10-09 DIAGNOSIS — C7802 Secondary malignant neoplasm of left lung: Secondary | ICD-10-CM

## 2016-10-09 DIAGNOSIS — C7951 Secondary malignant neoplasm of bone: Secondary | ICD-10-CM

## 2016-10-09 DIAGNOSIS — C50919 Malignant neoplasm of unspecified site of unspecified female breast: Secondary | ICD-10-CM

## 2016-10-09 DIAGNOSIS — Z170 Estrogen receptor positive status [ER+]: Secondary | ICD-10-CM | POA: Diagnosis not present

## 2016-10-09 DIAGNOSIS — C787 Secondary malignant neoplasm of liver and intrahepatic bile duct: Secondary | ICD-10-CM | POA: Diagnosis not present

## 2016-10-09 DIAGNOSIS — Z95828 Presence of other vascular implants and grafts: Secondary | ICD-10-CM

## 2016-10-09 DIAGNOSIS — C7949 Secondary malignant neoplasm of other parts of nervous system: Secondary | ICD-10-CM

## 2016-10-09 LAB — COMPREHENSIVE METABOLIC PANEL
ALBUMIN: 3.1 g/dL — AB (ref 3.5–5.0)
ALK PHOS: 126 U/L (ref 40–150)
ALT: 33 U/L (ref 0–55)
ANION GAP: 4 meq/L (ref 3–11)
AST: 43 U/L — ABNORMAL HIGH (ref 5–34)
BUN: 9.3 mg/dL (ref 7.0–26.0)
CALCIUM: 8.5 mg/dL (ref 8.4–10.4)
CHLORIDE: 107 meq/L (ref 98–109)
CO2: 26 mEq/L (ref 22–29)
CREATININE: 0.7 mg/dL (ref 0.6–1.1)
EGFR: >90 mL/min/{1.73_m2} (ref >90)
Glucose: 111 mg/dl (ref 70–140)
POTASSIUM: 4 meq/L (ref 3.5–5.1)
Sodium: 137 mEq/L (ref 136–145)
Total Bilirubin: 0.43 mg/dL (ref 0.20–1.20)
Total Protein: 6.1 g/dL — ABNORMAL LOW (ref 6.4–8.3)

## 2016-10-09 LAB — CBC WITH DIFFERENTIAL/PLATELET
BASO%: 1 % (ref 0.0–2.0)
BASOS ABS: 0 10*3/uL (ref 0.0–0.1)
EOS%: 2.3 % (ref 0.0–7.0)
Eosinophils Absolute: 0.1 10*3/uL (ref 0.0–0.5)
HEMATOCRIT: 36.5 % (ref 34.8–46.6)
HEMOGLOBIN: 11.9 g/dL (ref 11.6–15.9)
LYMPH#: 0.4 10*3/uL — AB (ref 0.9–3.3)
LYMPH%: 18.6 % (ref 14.0–49.7)
MCH: 32.6 pg (ref 25.1–34.0)
MCHC: 32.6 g/dL (ref 31.5–36.0)
MCV: 99.9 fL (ref 79.5–101.0)
MONO#: 0.4 10*3/uL (ref 0.1–0.9)
MONO%: 18.5 % — ABNORMAL HIGH (ref 0.0–14.0)
NEUT#: 1.4 10*3/uL — ABNORMAL LOW (ref 1.5–6.5)
NEUT%: 59.6 % (ref 38.4–76.8)
PLATELETS: 220 10*3/uL (ref 145–400)
RBC: 3.66 10*6/uL — ABNORMAL LOW (ref 3.70–5.45)
RDW: 18.1 % — ABNORMAL HIGH (ref 11.2–14.5)
WBC: 2.4 10*3/uL — ABNORMAL LOW (ref 3.9–10.3)

## 2016-10-09 MED ORDER — SODIUM CHLORIDE 0.9 % IV SOLN
Freq: Once | INTRAVENOUS | Status: AC
  Administered 2016-10-09: 13:00:00 via INTRAVENOUS

## 2016-10-09 MED ORDER — HEPARIN SOD (PORK) LOCK FLUSH 100 UNIT/ML IV SOLN
500.0000 [IU] | Freq: Once | INTRAVENOUS | Status: AC | PRN
  Administered 2016-10-09: 500 [IU]
  Filled 2016-10-09: qty 5

## 2016-10-09 MED ORDER — ONDANSETRON HCL 4 MG/2ML IJ SOLN
INTRAMUSCULAR | Status: AC
  Filled 2016-10-09: qty 2

## 2016-10-09 MED ORDER — ONDANSETRON HCL 4 MG/2ML IJ SOLN
INTRAMUSCULAR | Status: AC
  Filled 2016-10-09: qty 4

## 2016-10-09 MED ORDER — ONDANSETRON HCL 4 MG/2ML IJ SOLN
8.0000 mg | Freq: Once | INTRAMUSCULAR | Status: AC
  Administered 2016-10-09: 8 mg via INTRAVENOUS

## 2016-10-09 MED ORDER — SODIUM CHLORIDE 0.9 % IV SOLN
600.0000 mg/m2 | Freq: Once | INTRAVENOUS | Status: AC
  Administered 2016-10-09: 1064 mg via INTRAVENOUS
  Filled 2016-10-09: qty 27.98

## 2016-10-09 MED ORDER — SODIUM CHLORIDE 0.9% FLUSH
10.0000 mL | INTRAVENOUS | Status: DC | PRN
  Administered 2016-10-09: 10 mL
  Filled 2016-10-09: qty 10

## 2016-10-09 MED ORDER — SODIUM CHLORIDE 0.9% FLUSH
10.0000 mL | Freq: Once | INTRAVENOUS | Status: AC
  Administered 2016-10-09: 10 mL
  Filled 2016-10-09: qty 10

## 2016-10-09 NOTE — Progress Notes (Signed)
ANC. 1.4 today. Ok to treat with chemo per Mendel Ryder C,NP.Notified infusion RN.

## 2016-10-09 NOTE — Progress Notes (Signed)
Per May Armel with Sheilah Mins, NP patient is ok to treat today with ANC of 1.4.

## 2016-10-09 NOTE — Progress Notes (Signed)
[place]  [phone] [phone]     ID: Jill Johnston DOB: Nov 03, 1965  MR#: [id number]  KYH#:[id number]  Patient Care Team: [name], MD as PCP - General (Oncology) [name], MD as Consulting Physician (Radiation Oncology) [name] as Consulting Physician (Internal Medicine) [name], MD as Consulting Physician (Internal Medicine) Scot Dock, NP OTHER MD:  CHIEF COMPLAINT: Estrogen receptor positive metastatic breast cancer  CURRENT TREATMENT: Gemcitabine, denosumab/Xgeva   BREAST CANCER HISTORY: From Dr. 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 The breast cancer was ER + 100%, PR 1%, HER 2 IHC/FISH negative, Ki67 52%, BRCA 1/2 negative, initial CA 2729 WNL. She had neoadjuvant AC x4 taxol x12 dose dense from 06-10-2007 thru 09-28-2007 by Dr B.Darovsky. 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 She had radiation to chest wall, supraclavicular region and left axilla 50.4 Gy with boost to mastectomy scar to 60.4 Gy, by Dr [name] from 12-15-2007 thru 01-31-2008. She was on tamoxifen from 02-20-2008 thru 09-25-2014. She developed pain in ribs 08-2014, with CA 2729 52 and PET CT and MRI with diffuse bone metastatic disease, no cord compression, small lung lesions and intrathoracic adenopathy. 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 She began Denosumab 120 mcg monthly starting 4-7-201, and letrozole + palbociclib beginning4-12-2014. She was seen in consultation by Dr [name] [phone]. Palbociclib dose decreased to 100 mg daily x 21 q 28 days due to neutropenia. CBC on 10-25-15: WBC 2.1, Hgb 13,  plt 186, ANC 0.9 [facility] and CT CAP at [facility] 10-25-15 with no clear pulmonary involvement, stable 2 cm lesion at dome of liver and 11 mm left hepatic lobe lesion. She had right tomo mammogram at [facility] 09-17-15, with heterogeneously dense breast tissue but no other mammographic findings of concern.  CA 2729 increased from 38 in 08-2015 to 49 in April 2017 and 65 in May 2017. CT CAP / PET 03-16-16 showed progression lung, liver and bone. Letrozole and ibrance DCd on 03-19-16, with plans to begin chemotherapy. 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 She elected whole brain RT, given by Dr [name] ~ 03-23-16 thru 04-10-16.  She had first gemzar on 04-24-16  She had evaluation for vaginal bleeding "from polyp", follows yearly with gynecologist in [place], up to date."  Her subsequent history is as detailed below.  INTERVAL HISTORY: Jill Johnston returns today for follow-up of her stage IV estrogen receptor positive breast cancer accompanied by her husband Legrand Como. The patient is being treated with denosumab/Xgeva given every 6 weeks and also with gemcitabine given days 1 and 8 of each treatment cycle. Today she is scheduled to receive day 1 cycle 8.  She is doing well today.  She continues to take oxycontin BID, and doesn't typically need the oxycodone break through [employer]  She tells me that she is going to [place] on 4/22 and is returning on 4/24, she wants to cancel her 4/20 appointments.  She has not yet had CT and wants to know why this has not been scheduled.  REVIEW OF SYSTEMS: She denies any fevers, chills, mucositis, nausea, vomiting, new pain, weakness, or any further concerns a detailed ros was conducted and is non contributory.    PAST MEDICAL HISTORY: Past Medical History:  Diagnosis Date  . Anxiety   . Breast cancer ([place])    Stage  IV, ER positive left upper outer quadrant breast cancer with metastasis to bone  . Breast cancer metastasized to multiple sites ([place]) 04/12/2016   bone, liver, lung and brain  . GERD (gastroesophageal reflux disease)   . Hypertension     PAST SURGICAL HISTORY: Past Surgical History:  Procedure Laterality Date  . CHOLECYSTECTOMY    . ESOPHAGOGASTRODUODENOSCOPY (EGD) WITH PROPOFOL N/A 07/12/2015   Procedure: ESOPHAGOGASTRODUODENOSCOPY (EGD) WITH PROPOFOL;  Surgeon: Rogene Houston, MD;  Location: [facility];  Service: Endoscopy;  Laterality: N/A;  1:00  . IR GENERIC HISTORICAL  04/22/2016   IR US GUIDE VASC ACCESS RIGHT 04/22/2016 Markus Daft, MD [facility]-INTERV RAD  . IR GENERIC HISTORICAL  04/22/2016   IR FLUORO GUIDE PORT INSERTION RIGHT 04/22/2016 Markus Daft, MD [facility]INTER[facility]  . MASTECTOMY  April 2009   Left breast with lymph node resection    FAMILY HISTORY Family History  Problem Relation Age of Onset  . Heart attack Mother     Died age 91  . Heart disease Father     Died age 37  . Brain cancer Father     pt not sure if he actually had this  . Cancer Maternal Grandmother     dx. NOS cancer at older age; d. 32  . Heart attack Maternal Grandfather 79  . Colon cancer Paternal Grandmother     d. 44s  The patient's father died at age 62 she believes from heart disease. The patient's mother died at age 34 also from a myocardial infarction. The patient had one brother, no sisters. There is no history of breast or ovarian cancer in the family despite extensive G neurologic research   GYNECOLOGIC HISTORY:  No LMP recorded. Patient is not currently having periods (Reason: Chemotherapy).  menarche age 42, first live birth age 60, the patient is GX P1. She stopped having periods with chemotherapy in 2009 and these have not resumed.   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 They recently moved  to [place] from [place]. Their son [name] is 35 years old as of November 2017 . The patient is [place] [employer]      ADVANCED DIRECTIVES:  the patient's husband is her healthcare power of attorney. There is no living will in place    HEALTH MAINTENANCE: Social History  Substance Use Topics  . Smoking status: Never Smoker  . Smokeless tobacco: Never Used  . Alcohol use No     Colonoscopy:  PAP:  Bone density:   No Known Allergies  Current Outpatient Prescriptions  Medication Sig Dispense Refill  . docusate sodium (COLACE) 100 MG capsule Take 100 mg by mouth daily.     . fluconazole (DIFLUCAN) 100 MG tablet Take 1 tablet (100 mg total) by mouth daily as needed (for thrush). 10 tablet 0  . ibuprofen (ADVIL,MOTRIN) 200 MG tablet Take 400 mg by mouth daily as needed.    . lidocaine-prilocaine (EMLA) cream Apply 1 application topically as needed. To port 1 hour before going to be accessed with needle. Cover with plastic wrap. 30 g 1  .  lisinopril (PRINIVIL,ZESTRIL) 10 MG tablet Take 1 tablet (10 mg total) by mouth daily. 30 tablet 0  . oxyCODONE (OXYCONTIN) 30 MG 12 hr tablet Take 30 mg by mouth 2 (two) times daily. 60 each 0  . Oxycodone HCl 10 MG TABS Take 1 tablet (10 mg total) by mouth every 3 (three) hours as needed. 60 tablet 0  . pantoprazole (PROTONIX) 40 MG tablet TAKE 1 TABLET(40 MG) BY MOUTH TWICE DAILY BEFORE A MEAL 60 tablet 5  . polyethylene glycol (MIRALAX / GLYCOLAX) packet Take 17 grams by mouth every day. 14 each 6   No current facility-administered medications for this visit.     OBJECTIVE:   Vitals:   10/09/16 1125  BP: 124/86  Pulse: 94  Resp: 18  Temp: 98.2 F (36.8 C)     Body mass index is 24.38 kg/m.    ECOG FS:1 - Symptomatic but completely ambulatory GENERAL: Patient is a well appearing female in no acute distress HEENT:  Sclerae anicteric. PERRL Oropharynx clear and moist. No ulcerations or evidence of oropharyngeal candidiasis. Neck is  supple.  NODES:  No cervical, supraclavicular, or axillary lymphadenopathy palpated.  BREAST EXAM:  Deferred. LUNGS:  Clear to auscultation bilaterally.  No wheezes or rhonchi. HEART:  Regular rate and rhythm. No murmur appreciated. ABDOMEN:  Soft, nontender.  Positive, normoactive bowel sounds. No organomegaly palpated. MSK:  No focal spinal tenderness to palpation. Full range of motion bilaterally in the upper extremities. EXTREMITIES:  No peripheral edema.   SKIN:  Clear with no obvious rashes or skin changes. No nail dyscrasia. NEURO:  Nonfocal. Well oriented.  Appropriate affect.     LAB RESULTS:  CMP     Component Value Date/Time   NA 139 09/25/2016 1215   K 3.8 09/25/2016 1215   CL 105 05/20/2016 0930   CO2 24 09/25/2016 1215   GLUCOSE 114 09/25/2016 1215   BUN 6.4 (L) 09/25/2016 1215   CREATININE 0.7 09/25/2016 1215   CALCIUM 8.2 (L) 09/25/2016 1215   PROT 6.4 09/25/2016 1215   ALBUMIN 3.2 (L) 09/25/2016 1215   AST 62 (H) 09/25/2016 1215   ALT 53 09/25/2016 1215   ALKPHOS 148 09/25/2016 1215   BILITOT 0.39 09/25/2016 1215   GFRNONAA >60 05/20/2016 0930   GFRAA >60 05/20/2016 0930    INo results found for: SPEP, UPEP  Lab Results  Component Value Date   WBC 2.4 (L) 10/09/2016   NEUTROABS 1.4 (L) 10/09/2016   HGB 11.9 10/09/2016   HCT 36.5 10/09/2016   MCV 99.9 10/09/2016   PLT 220 10/09/2016      Chemistry      Component Value Date/Time   NA 139 09/25/2016 1215   K 3.8 09/25/2016 1215   CL 105 05/20/2016 0930   CO2 24 09/25/2016 1215   BUN 6.4 (L) 09/25/2016 1215   CREATININE 0.7 09/25/2016 1215      Component Value Date/Time   CALCIUM 8.2 (L) 09/25/2016 1215   ALKPHOS 148 09/25/2016 1215   AST 62 (H) 09/25/2016 1215   ALT 53 09/25/2016 1215   BILITOT 0.39 09/25/2016 1215       Lab Results  Component Value Date   LABCA2 98 (H) 02/27/2016    No components found for: ENIDP824  No results for input(s): INR in the last 168  hours.  Urinalysis    Component Value Date/Time   COLORURINE ORANGE (A) 05/17/2016 0852   APPEARANCEUR CLOUDY (A) 05/17/2016 0852   LABSPEC 1.027 05/17/2016 2353  PHURINE 6.0 05/17/2016 0852   GLUCOSEU NEGATIVE 05/17/2016 0852   HGBUR NEGATIVE 05/17/2016 0852   BILIRUBINUR SMALL (A) 05/17/2016 0852   KETONESUR NEGATIVE 05/17/2016 0852   PROTEINUR 30 (A) 05/17/2016 0852   NITRITE NEGATIVE 05/17/2016 0852   LEUKOCYTESUR TRACE (A) 05/17/2016 0852      Ref Range & Units 3wk ago 59moago 212mogo 50m55moo   CA 27.29 0.0 - 38.6 U/mL 48.1   49.7CM   54.3CM   178.8CM    Comments: [facility]       STUDIES: No results found.  ELIGIBLE FOR AVAILABLE RESEARCH PROTOCOL: no  ASSESSMENT: 51 26o. BRCA negative [place] status post left breast upper inner quadrant biopsy 05/17/2007 for a clinical mT2 N2, stage IIIA invasive ductal breast cancer, strongly estrogen receptor positive, weakly progesterone receptor positive, HER-2 negative, with an MIB-1 of 52%  (2) neoadjuvant chemotherapy consisted of dose dense doxorubicin and cyclophosphamide 4 followed by weekly paclitaxel 12, started 06/10/2007, completed 09/28/2007  (3) status [phone] invasive ductal carcinoma, grade 2, pT2, pN1a (downstaged to IIB), estrogen receptor strongly positive, progesterone receptor weakly positive, HER-2 not amplified by immunohistochemistry or FISH with a signals ratio of 0.94, number per cell 3.4  (4) status post adjuvant radiation to the left chest wall as well as the left supraclavicular and axillary nodal regions (50.4 gray +10 Gray boost) given between 12/15/2007 on 01/31/2008  (5) on adjuvant tamoxifen 02/20/2008 through March 2016  METASTATIC DISEASE: March 2016, presenting with bone pain (6) staging studies April 2016 showed diffuse bony metastatic  disease, intrathoracic adenopathy, and small lung lesions.  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  (a) brain MRI 07/16/2016 interpreted as requiring only further follow-up  (10) repeat genetic testing 04/23/2016 [facility] no deleterious mutations in APC, ATM, BARD1, BMPR1A, BRCA1, BRCA2, BRIP1, CHD1, CDK4, CDKN2A, CHEK2, EPCAM (large rearrangement only), GREM1/SCG5, MLH1, MSH2, MSH6, MUTYH, NBN, PALB2, PMS2, POLD1, POLE, PTEN, RAD51C, RAD51D, SMAD4, STK11, and TP53  (11) gemcitabine started 04/24/2016,  given days 1 and 8 of each 21 day cycle  (a) day 8 cycle 1 delayed because of cytopenias--dose reduced and neupogen added  (b) CT scan of the chest 07/16/2016 shows decrease in the size of liver metastases.   PLAN: Jill Johnston doing well today, and she will receive Gemcitabine today.  She has neupogen scheduled on 4/16 and 4/17.  I completed FMLA paperwork today for her husband.  She is tolerating chemotherapy well.  I will reach out to NicElmyra Ricks see if CT chest is authorized and once it is, will contact central radiology to call patient to schedule CT chest.  She will continue her current pain regimen as this is working well for her.    They know to call for any problems that may develop before her next visit here.  A total of (30) minutes of face-to-face time  was spent with this patient with greater than 50% of that time in counseling and care-coordination.   Scot Dock, NP   10/09/2016 11:37 AM Medical Oncology and Hematology [facility] [address] [place], [place] [address] Tel. [phone]    Fax. [phone]

## 2016-10-09 NOTE — Patient Instructions (Signed)
[facility] Discharge Instructions for Patients Receiving Chemotherapy  Today you received the following chemotherapy agents Gemzar.  To help prevent nausea and vomiting after your treatment, we encourage you to take your nausea medication.   If you develop nausea and vomiting that is not controlled by your nausea medication, call the clinic.   BELOW ARE SYMPTOMS THAT SHOULD BE REPORTED IMMEDIATELY:  *FEVER GREATER THAN 100.5 F  *CHILLS WITH OR WITHOUT FEVER  NAUSEA AND VOMITING THAT IS NOT CONTROLLED WITH YOUR NAUSEA MEDICATION  *UNUSUAL SHORTNESS OF BREATH  *UNUSUAL BRUISING OR BLEEDING  TENDERNESS IN MOUTH AND THROAT WITH OR WITHOUT PRESENCE OF ULCERS  *URINARY PROBLEMS  *BOWEL PROBLEMS  UNUSUAL RASH Items with * indicate a potential emergency and should be followed up as soon as possible.  Feel free to call the clinic you have any questions or concerns. The clinic phone number is [phone].  Please show the CHEMO ALERT CARD at check-in to the Emergency Department and triage nurse.   

## 2016-10-12 ENCOUNTER — Ambulatory Visit (HOSPITAL_BASED_OUTPATIENT_CLINIC_OR_DEPARTMENT_OTHER): Payer: BLUE CROSS/BLUE SHIELD

## 2016-10-12 VITALS — BP 112/85 | HR 100 | Resp 20

## 2016-10-12 DIAGNOSIS — Z5189 Encounter for other specified aftercare: Secondary | ICD-10-CM | POA: Diagnosis not present

## 2016-10-12 DIAGNOSIS — C787 Secondary malignant neoplasm of liver and intrahepatic bile duct: Secondary | ICD-10-CM

## 2016-10-12 DIAGNOSIS — C7802 Secondary malignant neoplasm of left lung: Secondary | ICD-10-CM

## 2016-10-12 DIAGNOSIS — C7951 Secondary malignant neoplasm of bone: Secondary | ICD-10-CM | POA: Diagnosis not present

## 2016-10-12 DIAGNOSIS — C50912 Malignant neoplasm of unspecified site of left female breast: Secondary | ICD-10-CM

## 2016-10-12 LAB — CANCER ANTIGEN 27.29: CA 27.29: 54.3 U/mL — ABNORMAL HIGH (ref 0.0–38.6)

## 2016-10-12 MED ORDER — TBO-FILGRASTIM 300 MCG/0.5ML ~~LOC~~ SOSY
300.0000 ug | PREFILLED_SYRINGE | Freq: Once | SUBCUTANEOUS | Status: AC
  Administered 2016-10-12: 300 ug via SUBCUTANEOUS
  Filled 2016-10-12: qty 0.5

## 2016-10-12 NOTE — Patient Instructions (Signed)
Tbo-Filgrastim injection What is this medicine? TBO-FILGRASTIM (T B O fil GRA stim) is a granulocyte colony-stimulating factor that stimulates the growth of neutrophils, a type of white blood cell important in the body's fight against infection. It is used to reduce the incidence of fever and infection in patients with certain types of cancer who are receiving chemotherapy that affects the bone marrow. This medicine may be used for other purposes; ask your health care provider or pharmacist if you have questions. COMMON BRAND NAME(S): Granix What should I tell my health care provider before I take this medicine? They need to know if you have any of these conditions: -bone scan or tests planned -kidney disease -sickle cell anemia -an unusual or allergic reaction to tbo-filgrastim, filgrastim, pegfilgrastim, other medicines, foods, dyes, or preservatives -pregnant or trying to get pregnant -breast-feeding How should I use this medicine? This medicine is for injection under the skin. If you get this medicine at home, you will be taught how to prepare and give this medicine. Refer to the Instructions for Use that come with your medication packaging. Use exactly as directed. Take your medicine at regular intervals. Do not take your medicine more often than directed. It is important that you put your used needles and syringes in a special sharps container. Do not put them in a trash can. If you do not have a sharps container, call your pharmacist or healthcare provider to get one. Talk to your pediatrician regarding the use of this medicine in children. Special care may be needed. Overdosage: If you think you have taken too much of this medicine contact a poison control center or emergency room at once. NOTE: This medicine is only for you. Do not share this medicine with others. What if I miss a dose? It is important not to miss your dose. Call your doctor or health care professional if you miss a  dose. What may interact with this medicine? This medicine may interact with the following medications: -medicines that may cause a release of neutrophils, such as lithium This list may not describe all possible interactions. Give your health care provider a list of all the medicines, herbs, non-prescription drugs, or dietary supplements you use. Also tell them if you smoke, drink alcohol, or use illegal drugs. Some items may interact with your medicine. What should I watch for while using this medicine? You may need blood work done while you are taking this medicine. What side effects may I notice from receiving this medicine? Side effects that you should report to your doctor or health care professional as soon as possible: -allergic reactions like skin rash, itching or hives, swelling of the face, lips, or tongue -blood in the urine -dark urine -dizziness -fast heartbeat -feeling faint -shortness of breath or breathing problems -signs and symptoms of infection like fever or chills; cough; or sore throat -signs and symptoms of kidney injury like trouble passing urine or change in the amount of urine -stomach or side pain, or pain at the shoulder -sweating -swelling of the legs, ankles, or abdomen -tiredness Side effects that usually do not require medical attention (report to your doctor or health care professional if they continue or are bothersome): -bone pain -headache -muscle pain -vomiting This list may not describe all possible side effects. Call your doctor for medical advice about side effects. You may report side effects to FDA at 1-800-FDA-1088. Where should I keep my medicine? Keep out of the reach of children. Store in a refrigerator between   2 and 8 degrees C (36 and 46 degrees F). Keep in carton to protect from light. Throw away this medicine if it is left out of the refrigerator for more than 5 consecutive days. Throw away any unused medicine after the expiration  date. NOTE: This sheet is a summary. It may not cover all possible information. If you have questions about this medicine, talk to your doctor, pharmacist, or health care provider.  2018 Elsevier/Gold Standard (2015-08-05 19:07:04)  

## 2016-10-13 ENCOUNTER — Ambulatory Visit (HOSPITAL_BASED_OUTPATIENT_CLINIC_OR_DEPARTMENT_OTHER): Payer: BLUE CROSS/BLUE SHIELD

## 2016-10-13 ENCOUNTER — Telehealth: Payer: Self-pay | Admitting: *Deleted

## 2016-10-13 ENCOUNTER — Other Ambulatory Visit: Payer: Self-pay | Admitting: *Deleted

## 2016-10-13 VITALS — BP 115/82 | HR 94 | Temp 97.9°F | Resp 18

## 2016-10-13 DIAGNOSIS — C7951 Secondary malignant neoplasm of bone: Secondary | ICD-10-CM

## 2016-10-13 DIAGNOSIS — C787 Secondary malignant neoplasm of liver and intrahepatic bile duct: Secondary | ICD-10-CM | POA: Diagnosis not present

## 2016-10-13 DIAGNOSIS — C7802 Secondary malignant neoplasm of left lung: Secondary | ICD-10-CM

## 2016-10-13 DIAGNOSIS — Z5189 Encounter for other specified aftercare: Secondary | ICD-10-CM | POA: Diagnosis not present

## 2016-10-13 DIAGNOSIS — C50912 Malignant neoplasm of unspecified site of left female breast: Secondary | ICD-10-CM | POA: Diagnosis not present

## 2016-10-13 MED ORDER — TBO-FILGRASTIM 300 MCG/0.5ML ~~LOC~~ SOSY
300.0000 ug | PREFILLED_SYRINGE | Freq: Once | SUBCUTANEOUS | Status: AC
  Administered 2016-10-13: 300 ug via SUBCUTANEOUS
  Filled 2016-10-13: qty 0.5

## 2016-10-13 NOTE — Telephone Encounter (Signed)
This RN contacted central scheduling per request for CT scan ordered for 5/1 - to be scheduled prior to this Friday.  Appointment obtained for 4/19 at 245pm with request for clear liquids only 4 hours prior to exam.  Called pt and obtained verified number - message left per above.

## 2016-10-13 NOTE — Patient Instructions (Signed)
Tbo-Filgrastim injection What is this medicine? TBO-FILGRASTIM (T B O fil GRA stim) is a granulocyte colony-stimulating factor that stimulates the growth of neutrophils, a type of white blood cell important in the body's fight against infection. It is used to reduce the incidence of fever and infection in patients with certain types of cancer who are receiving chemotherapy that affects the bone marrow. This medicine may be used for other purposes; ask your health care provider or pharmacist if you have questions. COMMON BRAND NAME(S): Granix What should I tell my health care provider before I take this medicine? They need to know if you have any of these conditions: -bone scan or tests planned -kidney disease -sickle cell anemia -an unusual or allergic reaction to tbo-filgrastim, filgrastim, pegfilgrastim, other medicines, foods, dyes, or preservatives -pregnant or trying to get pregnant -breast-feeding How should I use this medicine? This medicine is for injection under the skin. If you get this medicine at home, you will be taught how to prepare and give this medicine. Refer to the Instructions for Use that come with your medication packaging. Use exactly as directed. Take your medicine at regular intervals. Do not take your medicine more often than directed. It is important that you put your used needles and syringes in a special sharps container. Do not put them in a trash can. If you do not have a sharps container, call your pharmacist or healthcare provider to get one. Talk to your pediatrician regarding the use of this medicine in children. Special care may be needed. Overdosage: If you think you have taken too much of this medicine contact a poison control center or emergency room at once. NOTE: This medicine is only for you. Do not share this medicine with others. What if I miss a dose? It is important not to miss your dose. Call your doctor or health care professional if you miss a  dose. What may interact with this medicine? This medicine may interact with the following medications: -medicines that may cause a release of neutrophils, such as lithium This list may not describe all possible interactions. Give your health care provider a list of all the medicines, herbs, non-prescription drugs, or dietary supplements you use. Also tell them if you smoke, drink alcohol, or use illegal drugs. Some items may interact with your medicine. What should I watch for while using this medicine? You may need blood work done while you are taking this medicine. What side effects may I notice from receiving this medicine? Side effects that you should report to your doctor or health care professional as soon as possible: -allergic reactions like skin rash, itching or hives, swelling of the face, lips, or tongue -blood in the urine -dark urine -dizziness -fast heartbeat -feeling faint -shortness of breath or breathing problems -signs and symptoms of infection like fever or chills; cough; or sore throat -signs and symptoms of kidney injury like trouble passing urine or change in the amount of urine -stomach or side pain, or pain at the shoulder -sweating -swelling of the legs, ankles, or abdomen -tiredness Side effects that usually do not require medical attention (report to your doctor or health care professional if they continue or are bothersome): -bone pain -headache -muscle pain -vomiting This list may not describe all possible side effects. Call your doctor for medical advice about side effects. You may report side effects to FDA at 1-800-FDA-1088. Where should I keep my medicine? Keep out of the reach of children. Store in a refrigerator between   2 and 8 degrees C (36 and 46 degrees F). Keep in carton to protect from light. Throw away this medicine if it is left out of the refrigerator for more than 5 consecutive days. Throw away any unused medicine after the expiration  date. NOTE: This sheet is a summary. It may not cover all possible information. If you have questions about this medicine, talk to your doctor, pharmacist, or health care provider.  2018 Elsevier/Gold Standard (2015-08-05 19:07:04)  

## 2016-10-15 ENCOUNTER — Other Ambulatory Visit: Payer: Self-pay

## 2016-10-15 ENCOUNTER — Ambulatory Visit (HOSPITAL_COMMUNITY)
Admission: RE | Admit: 2016-10-15 | Discharge: 2016-10-15 | Disposition: A | Discharge status: Home / Self Care | Payer: BLUE CROSS/BLUE SHIELD | Source: Ambulatory Visit | Attending: Oncology | Admitting: Oncology

## 2016-10-15 DIAGNOSIS — C7802 Secondary malignant neoplasm of left lung: Secondary | ICD-10-CM | POA: Insufficient documentation

## 2016-10-15 DIAGNOSIS — C7949 Secondary malignant neoplasm of other parts of nervous system: Secondary | ICD-10-CM

## 2016-10-15 DIAGNOSIS — J9811 Atelectasis: Secondary | ICD-10-CM | POA: Insufficient documentation

## 2016-10-15 DIAGNOSIS — C7951 Secondary malignant neoplasm of bone: Secondary | ICD-10-CM | POA: Insufficient documentation

## 2016-10-15 DIAGNOSIS — C50912 Malignant neoplasm of unspecified site of left female breast: Secondary | ICD-10-CM | POA: Insufficient documentation

## 2016-10-15 DIAGNOSIS — C787 Secondary malignant neoplasm of liver and intrahepatic bile duct: Secondary | ICD-10-CM | POA: Insufficient documentation

## 2016-10-15 MED ORDER — HEPARIN SOD (PORK) LOCK FLUSH 100 UNIT/ML IV SOLN: 500.0000 [IU] | Freq: Once | INTRAVENOUS | Status: DC

## 2016-10-15 MED ORDER — IOPAMIDOL (ISOVUE-300) INJECTION 61%
75.0000 mL | Freq: Once | INTRAVENOUS | Status: AC | PRN
Start: 2016-10-15 — End: 2016-10-15
  Administered 2016-10-15: 75 mL via INTRAVENOUS

## 2016-10-15 MED ORDER — HEPARIN SOD (PORK) LOCK FLUSH 100 UNIT/ML IV SOLN
INTRAVENOUS | Status: AC
  Filled 2016-10-15: qty 5

## 2016-10-15 MED ORDER — IOPAMIDOL (ISOVUE-300) INJECTION 61%
INTRAVENOUS | Status: DC
Start: 2016-10-15 — End: 2016-10-16
  Filled 2016-10-15: qty 75

## 2016-10-15 MED ORDER — OXYCODONE HCL ER 30 MG PO T12A: 30.0000 mg | EXTENDED_RELEASE_TABLET | Freq: Two times a day (BID) | ORAL | 0 refills | Status: DC

## 2016-10-16 ENCOUNTER — Ambulatory Visit: Payer: BLUE CROSS/BLUE SHIELD

## 2016-10-16 ENCOUNTER — Other Ambulatory Visit: Payer: BLUE CROSS/BLUE SHIELD

## 2016-10-19 ENCOUNTER — Ambulatory Visit: Payer: BLUE CROSS/BLUE SHIELD | Admitting: Radiation Oncology

## 2016-10-19 ENCOUNTER — Other Ambulatory Visit: Payer: BLUE CROSS/BLUE SHIELD

## 2016-10-19 ENCOUNTER — Ambulatory Visit: Payer: BLUE CROSS/BLUE SHIELD

## 2016-10-20 ENCOUNTER — Ambulatory Visit: Payer: BLUE CROSS/BLUE SHIELD

## 2016-10-20 ENCOUNTER — Encounter: Payer: Self-pay | Admitting: Oncology

## 2016-10-20 NOTE — Progress Notes (Signed)
Received PA questionnaire for Oxycontin from [employer]. Physician completed. Faxed back to [employer]. Fax received ok per confirmation sheet.

## 2016-10-21 ENCOUNTER — Ambulatory Visit: Payer: Self-pay | Admitting: Radiation Oncology

## 2016-10-22 ENCOUNTER — Ambulatory Visit: Payer: BLUE CROSS/BLUE SHIELD

## 2016-10-22 ENCOUNTER — Ambulatory Visit
Admission: RE | Admit: 2016-10-22 | Discharge: 2016-10-22 | Disposition: A | Discharge status: Home / Self Care | Payer: BLUE CROSS/BLUE SHIELD | Source: Ambulatory Visit | Attending: Radiation Oncology | Admitting: Radiation Oncology

## 2016-10-22 DIAGNOSIS — C7931 Secondary malignant neoplasm of brain: Secondary | ICD-10-CM

## 2016-10-22 DIAGNOSIS — C7949 Secondary malignant neoplasm of other parts of nervous system: Principal | ICD-10-CM

## 2016-10-22 MED ORDER — GADOBENATE DIMEGLUMINE 529 MG/ML IV SOLN
13.0000 mL | Freq: Once | INTRAVENOUS | Status: AC | PRN
  Administered 2016-10-22: 13 mL via INTRAVENOUS

## 2016-10-23 ENCOUNTER — Ambulatory Visit: Payer: BLUE CROSS/BLUE SHIELD

## 2016-10-23 NOTE — Progress Notes (Addendum)
Jill Johnston 51 y.o. woman with progressive metastatic ER/PR positive breast cancer and symptomatic leptomeningeal carcinomatosis radiation completed 04-10-16, review MRI brain w wo contrast, FU.    PAIN: She is currently  NEURO:Alert and oriented x 3 with fluent speech,able to complete sentences without difficulty with word finding or organization of sentences.  Very anxious today. Denies any visual disturbance,blurred vision,double vision,blind spots or peripheral vision changes, Denies reports any nausea or vomiting,ataxia,ring of ears,dizziness.  Reports she has some hearing loss  in the left ear since having radiation. Reports headache for several weeks that last for several hours taking Oxycontin. Fine motor movement-picking up objects with fingers,holding objects, writing, weakness of lower extremities:  Has noticed weakness at times in her lower extremities, no fine motor movement change. Aphasia/Slurred speech:None SKIN:Warm and dry. Imaging:10-22-16 MRI brain w wo contrast Lab:10-09-16 Cmet, CBCw diff, Cancer antigen 27.29 09-18-16 Saw Dr. Vanessa Kick   She is tolerating the gemcitabine and Xgeva well  Eating and drinking without problems. Wt Readings from Last 3 Encounters:  10/26/16 141 lb (64 kg)  10/09/16 146 lb 8 oz (66.5 kg)  09/18/16 148 lb 1.6 oz (67.2 kg)  Pulse (!) 104   Temp 98.6 F (37 C) (Oral)   Resp 16   Ht 5\' 5"  (1.651 m)   Wt 141 lb (64 kg)   BMI 23.46 kg/m

## 2016-10-26 ENCOUNTER — Ambulatory Visit
Admission: RE | Admit: 2016-10-26 | Discharge: 2016-10-26 | Disposition: A | Discharge status: Home / Self Care | Payer: BLUE CROSS/BLUE SHIELD | Source: Ambulatory Visit | Attending: Radiation Oncology | Admitting: Radiation Oncology

## 2016-10-26 ENCOUNTER — Encounter: Payer: Self-pay | Admitting: Radiation Oncology

## 2016-10-26 VITALS — HR 104 | Temp 98.6°F | Resp 16 | Ht 65.0 in | Wt 141.0 lb

## 2016-10-26 DIAGNOSIS — Z79899 Other long term (current) drug therapy: Secondary | ICD-10-CM | POA: Insufficient documentation

## 2016-10-26 DIAGNOSIS — M542 Cervicalgia: Secondary | ICD-10-CM

## 2016-10-26 DIAGNOSIS — C7931 Secondary malignant neoplasm of brain: Secondary | ICD-10-CM | POA: Diagnosis not present

## 2016-10-26 DIAGNOSIS — Z170 Estrogen receptor positive status [ER+]: Secondary | ICD-10-CM | POA: Insufficient documentation

## 2016-10-26 DIAGNOSIS — Z510 Encounter for antineoplastic radiation therapy: Secondary | ICD-10-CM | POA: Diagnosis present

## 2016-10-26 DIAGNOSIS — C50912 Malignant neoplasm of unspecified site of left female breast: Secondary | ICD-10-CM

## 2016-10-26 DIAGNOSIS — I10 Essential (primary) hypertension: Secondary | ICD-10-CM | POA: Insufficient documentation

## 2016-10-26 DIAGNOSIS — C7949 Secondary malignant neoplasm of other parts of nervous system: Secondary | ICD-10-CM

## 2016-10-26 DIAGNOSIS — F419 Anxiety disorder, unspecified: Secondary | ICD-10-CM | POA: Insufficient documentation

## 2016-10-26 DIAGNOSIS — C7951 Secondary malignant neoplasm of bone: Secondary | ICD-10-CM | POA: Insufficient documentation

## 2016-10-26 DIAGNOSIS — K219 Gastro-esophageal reflux disease without esophagitis: Secondary | ICD-10-CM | POA: Diagnosis not present

## 2016-10-26 NOTE — Progress Notes (Signed)
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   Interval Since Last Radiation:  5 months   03/30/16 - 04/10/16:  The whole brain was treated to 30Gy in 21fractions of 3Gy.  01/10/15-01/23/15: Metastatic deposits in the sacrum and femurs were conformally treated to 30 Gy in 10 fractions of 3 Gy  12/15/07-01/31/08: 50.4 Gy to the chest wall with supraclavicular and left axillary field with a boost to the mastectomy scar to total 60.4 Gy  Narrative: [name] is a well known patient to our clinic who was originally treated in 2009 with radiotherapy to the chest wall and regional lymph nodes. Her cancer recurred in the bones and she received radiotherapy to the sacrum and femurs and was treated in 2016 for this. She was found in the fall of 2017 to have metastatic disease to the brain and she completed whole brain radiotherapy as there were concerns for leptomeningeal disease. She remains under the care of Dr. Jana Hakim and her current regimen is Gemzar/denosumab/Xgeva. She had a recent MRI of the brain on  10/22/16 which revealed stability of her previously treated disease. Incidentally there is mention of a C3 fracture which is classified as stable in appearance from her last MRI when it was classified an old C3 pathologic fracture.     On review of systems, the patient reports that she is doing well overall except for her neck pain. She describes a dull aching worse during the day. She reports that looking downward makes this worse, and  denies any chest pain, shortness of breath, cough, fevers, chills, night sweats, unintended weight changes. She denies any bowel or bladder disturbances, and denies abdominal pain,  nausea or vomiting. She denies any new musculoskeletal or joint aches or pains, new skin lesions or concerns. A complete review of systems is obtained and is otherwise negative.  Past Medical History:  Past Medical History:  Diagnosis Date  . Anxiety   . 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 Breast cancer metastasized to multiple sites ([facility]) 04/12/2016   bone, liver, lung and brain  . GERD (gastroesophageal reflux disease)   . Hypertension     Past Surgical History: Past Surgical History:  Procedure Laterality Date  . CHOLECYSTECTOMY    . ESOPHAGOGASTRODUODENOSCOPY (EGD) WITH PROPOFOL N/A 07/12/2015   Procedure: ESOPHAGOGASTRODUODENOSCOPY (EGD) WITH PROPOFOL;  Surgeon: Rogene Houston, MD;  Location: [facility];  Service: Endoscopy;  Laterality: N/A;  1:00  . IR GENERIC HISTORICAL  04/22/2016   IR US GUIDE VASC ACCESS RIGHT 04/22/2016 Markus Daft, MD [facility]  . IR GENERIC HISTORICAL  04/22/2016   IR FLUORO GUIDE PORT INSERTION RIGHT 04/22/2016 Markus Daft, MD [facility]  . MASTECTOMY  April 2009   Left breast with lymph node resection    Social History:  Social History   Social History  . Marital status: Married    Spouse name: N/A  . Number of children: N/A  . Years of education: N/A   Occupational History  . Not on file.   Social History Main Topics  . Smoking status: Never Smoker  . Smokeless tobacco: Never  Used  . Alcohol use No  . Drug use: No  . Sexual activity: Yes   Other Topics Concern  . Not on file   Social History Narrative  . No narrative on file    Family History: Family History  Problem Relation Age of Onset  . Heart attack Mother     Died age 68  . Heart disease Father     Died age 61  . Brain cancer Father     pt not sure if he actually had this  . Cancer Maternal Grandmother     dx. NOS cancer at older age; d. 4  . Heart attack Maternal Grandfather 79    . Colon cancer Paternal Grandmother     d. 27s    ALLERGIES:  has No Known Allergies.  Meds: Current Outpatient Prescriptions  Medication Sig Dispense Refill  . docusate sodium (COLACE) 100 MG capsule Take 100 mg by mouth daily.     . fluconazole (DIFLUCAN) 100 MG tablet Take 1 tablet (100 mg total) by mouth daily as needed (for thrush). 10 tablet 0  . ibuprofen (ADVIL,MOTRIN) 200 MG tablet Take 400 mg by mouth daily as needed.    . lidocaine-prilocaine (EMLA) cream Apply 1 application topically as needed. To port 1 hour before going to be accessed with needle. Cover with plastic wrap. 30 g 1  . lisinopril (PRINIVIL,ZESTRIL) 10 MG tablet Take 1 tablet (10 mg total) by mouth daily. 30 tablet 0  . oxyCODONE (OXYCONTIN) 30 MG 12 hr tablet Take 30 mg by mouth 2 (two) times daily. 60 each 0  . Oxycodone HCl 10 MG TABS Take 1 tablet (10 mg total) by mouth every 3 (three) hours as needed. 60 tablet 0  . pantoprazole (PROTONIX) 40 MG tablet TAKE 1 TABLET(40 MG) BY MOUTH TWICE DAILY BEFORE A MEAL 60 tablet 5  . polyethylene glycol (MIRALAX / GLYCOLAX) packet Take 17 grams by mouth every day. 14 each 6   No current facility-administered medications for this encounter.     Physical Findings: Wt Readings from Last 3 Encounters:  10/09/16 146 lb 8 oz (66.5 kg)  09/18/16 148 lb 1.6 oz (67.2 kg)  08/28/16 150 lb 9.6 oz (68.3 kg)   Temp Readings from Last 3 Encounters:  10/13/16 97.9 F (36.6 C) (Oral)  10/09/16 98.2 F (36.8 C) (Oral)  09/29/16 97.7 F (36.5 C) (Oral)   BP Readings from Last 3 Encounters:  10/13/16 115/82  10/12/16 112/85  10/09/16 124/86   Pulse Readings from Last 3 Encounters:  10/13/16 94  10/12/16 100  10/09/16 94   In general this is a well appearing Caucasian, [place] female in no acute distress. She has stigmata of steroid use with moon facies and buffalo hump. She's alert and oriented x4 and appropriate throughout the examination. Cardiopulmonary  assessment is negative for acute distress and she exhibits normal effort. She describes pain in the posterior neck along the C3-C4 region. She has full range of motion, but is limited by pain.  Lab Findings: Lab Results  Component Value Date   WBC 2.4 (L) 10/09/2016   HGB 11.9 10/09/2016   HCT 36.5 10/09/2016   MCV 99.9 10/09/2016   PLT 220 10/09/2016     Radiographic Findings: Ct Chest W Contrast  Result Date: 10/16/2016 CLINICAL DATA:  Breast cancer diagnosed 2008 (516 hypo brain metastasis. History of liver metastasis skeletal metastasis EXAM: CT CHEST WITH CONTRAST TECHNIQUE: Multidetector CT imaging of the chest was performed during  intravenous contrast administration. CONTRAST:  62mL ISOVUE-300 IOPAMIDOL (ISOVUE-300) INJECTION 61% COMPARISON:  PET-CT 03/16/2016, CT 07/16/2016 FINDINGS: Cardiovascular: Port in the RIGHT chest wall. No significant vascular findings. Normal heart size. No pericardial effusion. Mediastinum/Nodes: No axillary supraclavicular adenopathy. No mediastinal hilar adenopathy. Esophagus normal. Lungs/Pleura: RIGHT upper lobe 5 mm nodule (image 28 series 5) compares to 5 mm. Subpleural reticulation in the LEFT upper lobe relates radiation therapy and not changed. Bands of linear atelectasis the lung bases are not changed. No new new or suspicious nodularity. Upper Abdomen: Lesion in the posterior RIGHT hepatic lobe is poorly defined measure approximately 3.3 cm compared with 3.5 cm on prior. The entire liver is not imaged. Adrenal glands normal. No upper abdominal adenopathy. Musculoskeletal: Diffuse sclerotic metastasis within the spine are similar. Several Focal sclerotic lesions in the sternum and manubrium are new. Small 5 mm sclerotic lesion in the manubrium (image 38, series 2) a new 2 mm lesion on image 40, series 2. IMPRESSION: 1. No evidence of pulmonary metastasis. 2. New small sclerotic lesions within the sternum. 3. Stable diffuse sclerotic metastasis within the  spine. 4. No significant change in liver metastasis. Electronically Signed   By: Suzy Bouchard M.D.   On: 10/16/2016 07:56   Mr Jeri Cos EP Contrast  Result Date: 10/22/2016 CLINICAL DATA:  Metastatic breast cancer. Whole-brain radiation therapy 9-03/2016. EXAM: MRI HEAD WITHOUT AND WITH CONTRAST TECHNIQUE: Multiplanar, multiecho pulse sequences of the brain and surrounding structures were obtained without and with intravenous contrast. 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 There is no evidence of acute infarct, intracranial hemorrhage, midline shift, or extra-axial fluid collection. The ventricles are normal in size. Patchy to confluent cerebral white matter T2 hyperintensities have increased and are nonspecific but may reflect progressive post treatment changes. 4 mm enhancing lesion associated with the dura in the left parietal region is unchanged (series 10, image 111). Similar but smaller punctate foci of nodular enhancement immediately adjacent to this lesion either associated with the dura or along the surface of the brain (Series 10 images 103-106) are much more conspicuous compared to the axial sequence from the prior study but are more similar in appearance on the coronal sequences. No definite new intracranial metastatic disease is identified remote from this region. Smooth dural thickening over the left cerebral convexity is overall slightly less prominent than on the prior study. Vascular: Major intracranial vascular flow voids are preserved. Skull and upper cervical spine: Similar appearance of diffusely heterogeneous calvarial and upper cervical spine bone marrow signal with patchy enhancement. A chronic C3 superior endplate fracture is again noted. Sinuses/Orbits: Unremarkable orbits. Minimal paranasal sinus mucosal thickening. Bilateral mastoid effusions. Other: None. IMPRESSION:  1. Unchanged 4 mm left parietal lesion with stable to slight worsening of nearby punctate lesions along the surface of the brain, compatible with leptomeningeal metastatic disease. Increased conspicuity of the small lesions may be technical (3T imaging and thinner slices). 2. Slight improvement of smooth dural thickening over the left cerebral convexity. 3. Progressive cerebral white matter T2 signal abnormality likely reflecting post treatment changes. 4. Diffuse osseous metastases. Electronically Signed   By: Logan Bores M.D.   On: 10/22/2016 14:03    Impression/Plan: 1. 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 The patient appears to be radiographically stable in the brain based on her most recent scans. We discussed the recommendations from conference included  repeat brain imaging in 3 months. She is in agreement. 2. Neck pain with C3 fracture. The patient is symptomatic with pain at this site. I will reach out to Dr. [name] and we discussed the options of asking neurosurgery to see the patient. She is interested in this as well as an MRI if Dr. [name] is in agreement. I will contact her regarding this after speaking with him.    Carola Rhine, PAC

## 2016-10-27 ENCOUNTER — Other Ambulatory Visit: Payer: Self-pay | Admitting: Radiation Oncology

## 2016-10-27 ENCOUNTER — Telehealth: Payer: Self-pay

## 2016-10-27 ENCOUNTER — Telehealth: Payer: Self-pay | Admitting: Radiation Oncology

## 2016-10-27 DIAGNOSIS — C7951 Secondary malignant neoplasm of bone: Secondary | ICD-10-CM

## 2016-10-27 DIAGNOSIS — C50912 Malignant neoplasm of unspecified site of left female breast: Secondary | ICD-10-CM

## 2016-10-27 DIAGNOSIS — C7949 Secondary malignant neoplasm of other parts of nervous system: Secondary | ICD-10-CM

## 2016-10-27 NOTE — Telephone Encounter (Signed)
Done. thanks

## 2016-10-27 NOTE — Telephone Encounter (Signed)
Mardene Celeste from [facility] called to request cervical MRI order be changed from "with contrast" to "with and without contrast". Mardene Celeste requested that you do not delete the old order because she has notes attached to it that need to be transcribed to the new order. The pt appt first available is for the 10th. The patient will be calling through the week to see if there is a cancellation.

## 2016-10-27 NOTE — Telephone Encounter (Signed)
I spoke with the patient to let her know Dr. [name] agrees with MRI C-spine and setting her up to see Dr. Maryjean Ka.

## 2016-10-29 ENCOUNTER — Other Ambulatory Visit: Payer: Self-pay | Admitting: Radiation Oncology

## 2016-10-30 ENCOUNTER — Ambulatory Visit: Payer: BLUE CROSS/BLUE SHIELD

## 2016-10-30 ENCOUNTER — Ambulatory Visit (HOSPITAL_BASED_OUTPATIENT_CLINIC_OR_DEPARTMENT_OTHER): Payer: BLUE CROSS/BLUE SHIELD | Admitting: Oncology

## 2016-10-30 ENCOUNTER — Ambulatory Visit (HOSPITAL_BASED_OUTPATIENT_CLINIC_OR_DEPARTMENT_OTHER): Payer: BLUE CROSS/BLUE SHIELD

## 2016-10-30 ENCOUNTER — Other Ambulatory Visit (HOSPITAL_BASED_OUTPATIENT_CLINIC_OR_DEPARTMENT_OTHER): Payer: BLUE CROSS/BLUE SHIELD

## 2016-10-30 VITALS — BP 130/89 | HR 87 | Temp 97.8°F | Resp 18 | Ht 65.0 in | Wt 142.7 lb

## 2016-10-30 DIAGNOSIS — C787 Secondary malignant neoplasm of liver and intrahepatic bile duct: Secondary | ICD-10-CM | POA: Diagnosis not present

## 2016-10-30 DIAGNOSIS — C50212 Malignant neoplasm of upper-inner quadrant of left female breast: Secondary | ICD-10-CM | POA: Insufficient documentation

## 2016-10-30 DIAGNOSIS — C7949 Secondary malignant neoplasm of other parts of nervous system: Secondary | ICD-10-CM

## 2016-10-30 DIAGNOSIS — C50912 Malignant neoplasm of unspecified site of left female breast: Secondary | ICD-10-CM

## 2016-10-30 DIAGNOSIS — C7802 Secondary malignant neoplasm of left lung: Secondary | ICD-10-CM | POA: Diagnosis not present

## 2016-10-30 DIAGNOSIS — Z5111 Encounter for antineoplastic chemotherapy: Secondary | ICD-10-CM | POA: Diagnosis not present

## 2016-10-30 DIAGNOSIS — C7951 Secondary malignant neoplasm of bone: Secondary | ICD-10-CM

## 2016-10-30 DIAGNOSIS — Z170 Estrogen receptor positive status [ER+]: Secondary | ICD-10-CM

## 2016-10-30 DIAGNOSIS — C50919 Malignant neoplasm of unspecified site of unspecified female breast: Secondary | ICD-10-CM

## 2016-10-30 DIAGNOSIS — C50922 Malignant neoplasm of unspecified site of left male breast: Secondary | ICD-10-CM

## 2016-10-30 LAB — CBC WITH DIFFERENTIAL/PLATELET
BASO%: 0.7 % (ref 0.0–2.0)
Basophils Absolute: 0 10*3/uL (ref 0.0–0.1)
EOS%: 2.4 % (ref 0.0–7.0)
Eosinophils Absolute: 0.1 10*3/uL (ref 0.0–0.5)
HCT: 37.7 % (ref 34.8–46.6)
HGB: 12.3 g/dL (ref 11.6–15.9)
LYMPH%: 24.5 % (ref 14.0–49.7)
MCH: 32.2 pg (ref 25.1–34.0)
MCHC: 32.5 g/dL (ref 31.5–36.0)
MCV: 98.8 fL (ref 79.5–101.0)
MONO#: 0.4 10*3/uL (ref 0.1–0.9)
MONO%: 15.3 % — AB (ref 0.0–14.0)
NEUT%: 57.1 % (ref 38.4–76.8)
NEUTROS ABS: 1.7 10*3/uL (ref 1.5–6.5)
PLATELETS: 231 10*3/uL (ref 145–400)
RBC: 3.82 10*6/uL (ref 3.70–5.45)
RDW: 17.5 % — ABNORMAL HIGH (ref 11.2–14.5)
WBC: 2.9 10*3/uL — AB (ref 3.9–10.3)
lymph#: 0.7 10*3/uL — ABNORMAL LOW (ref 0.9–3.3)

## 2016-10-30 LAB — COMPREHENSIVE METABOLIC PANEL
ALT: 32 U/L (ref 0–55)
ANION GAP: 8 meq/L (ref 3–11)
AST: 48 U/L — ABNORMAL HIGH (ref 5–34)
Albumin: 3.1 g/dL — ABNORMAL LOW (ref 3.5–5.0)
Alkaline Phosphatase: 138 U/L (ref 40–150)
BUN: 9 mg/dL (ref 7.0–26.0)
CHLORIDE: 107 meq/L (ref 98–109)
CO2: 26 mEq/L (ref 22–29)
Calcium: 8.6 mg/dL (ref 8.4–10.4)
Creatinine: 0.7 mg/dL (ref 0.6–1.1)
GLUCOSE: 119 mg/dL (ref 70–140)
Potassium: 4 mEq/L (ref 3.5–5.1)
SODIUM: 141 meq/L (ref 136–145)
Total Bilirubin: 0.46 mg/dL (ref 0.20–1.20)
Total Protein: 6.6 g/dL (ref 6.4–8.3)

## 2016-10-30 MED ORDER — ONDANSETRON HCL 4 MG/2ML IJ SOLN
INTRAMUSCULAR | Status: AC
  Filled 2016-10-30: qty 4

## 2016-10-30 MED ORDER — ONDANSETRON HCL 4 MG/2ML IJ SOLN
8.0000 mg | Freq: Once | INTRAMUSCULAR | Status: AC
  Administered 2016-10-30: 8 mg via INTRAVENOUS

## 2016-10-30 MED ORDER — OXYCODONE HCL 10 MG PO TABS: 10.0000 mg | ORAL_TABLET | ORAL | 0 refills | Status: DC | PRN

## 2016-10-30 MED ORDER — SODIUM CHLORIDE 0.9 % IV SOLN
600.0000 mg/m2 | Freq: Once | INTRAVENOUS | Status: AC
  Administered 2016-10-30: 1064 mg via INTRAVENOUS
  Filled 2016-10-30: qty 27.98

## 2016-10-30 MED ORDER — SODIUM CHLORIDE 0.9% FLUSH
10.0000 mL | INTRAVENOUS | Status: DC | PRN
  Administered 2016-10-30: 10 mL
  Filled 2016-10-30: qty 10

## 2016-10-30 MED ORDER — OXYCODONE HCL ER 30 MG PO T12A: 30.0000 mg | EXTENDED_RELEASE_TABLET | Freq: Two times a day (BID) | ORAL | 0 refills | Status: DC

## 2016-10-30 MED ORDER — SODIUM CHLORIDE 0.9 % IV SOLN
Freq: Once | INTRAVENOUS | Status: AC
  Administered 2016-10-30: 12:00:00 via INTRAVENOUS

## 2016-10-30 MED ORDER — HEPARIN SOD (PORK) LOCK FLUSH 100 UNIT/ML IV SOLN
500.0000 [IU] | Freq: Once | INTRAVENOUS | Status: AC | PRN
  Administered 2016-10-30: 500 [IU]
  Filled 2016-10-30: qty 5

## 2016-10-30 NOTE — Progress Notes (Signed)
[place]  [phone] [phone]     ID: Jill Johnston DOB: January 11, 1966  MR#: [id number]  IRW#:[id number]  Patient Care Team: [name], MD as PCP - General (Oncology) [name], MD as Consulting Physician (Radiation Oncology) Muss, Demaris Callander as Consulting Physician (Internal Medicine) Jacquiline Doe, Marko Stai, MD as Consulting Physician (Internal Medicine) Clydell Hakim, MD as Consulting Physician (Anesthesiology) [name], MD OTHER MD:  CHIEF COMPLAINT: Estrogen receptor positive metastatic breast cancer  CURRENT TREATMENT: Gemcitabine, denosumab/Xgeva   BREAST CANCER HISTORY: From Dr. 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 The breast cancer was ER + 100%, PR 1%, HER 2 IHC/FISH negative, Ki67 52%, BRCA 1/2 negative, initial CA 2729 WNL. She had neoadjuvant AC x4 taxol x12 dose dense from 06-10-2007 thru 09-28-2007 by Dr B.Darovsky. 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 She had radiation to chest wall, supraclavicular region and left axilla 50.4 Gy with boost to mastectomy scar to 60.4 Gy, by Dr [name] from 12-15-2007 thru 01-31-2008. She was on tamoxifen from 02-20-2008 thru 09-25-2014. She developed pain in ribs 08-2014, with CA 2729 52 and PET CT and MRI with diffuse bone metastatic disease, no cord compression, small lung lesions and intrathoracic adenopathy. 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 She began Denosumab 120 mcg monthly starting 4-7-201,51 and letrozole + palbociclib beginning4-12-2014. She was seen in consultation by Dr [name] [phone]. Palbociclib dose decreased to 100 mg daily x 21  q 28 days due to neutropenia. CBC on 10-25-15: WBC 2.1, Hgb 13, plt 186, ANC 0.9 PET 09-25-2014 in [facility] system and CT CAP at [facility] 10-25-15 with no clear pulmonary involvement, stable 2 cm lesion at dome of liver and 11 mm left hepatic lobe lesion. She had right tomo mammogram at [facility] 09-17-15, with heterogeneously dense breast tissue but no other mammographic findings of concern.  CA 2729 increased from 38 in 08-2015 to 49 in April 2017 and 65 in May 2017. CT CAP / PET 03-16-16 showed progression lung, liver and bone. Letrozole and ibrance DCd on 03-19-16, with plans to begin chemotherapy. 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 She elected whole brain RT, given by Dr [name] ~ 03-23-16 thru 04-10-16.  She had first gemzar on 04-24-16  She had evaluation for vaginal bleeding "from polyp", follows yearly with gynecologist in [place], up to date."  Her subsequent history is as detailed below.  INTERVAL HISTORY: Jill Johnston returns today for follow-up of her metastatic estrogen receptor positive breast cancer accompanied by her husband Jill Johnston 10/15/2016, which showed stable diffuse sclerotic metastases in the spine, stable liver metastases, and no evidence of pulmonary metastases. There were new small sclerotic lesions in the sternum. She also had a brain MRI with and without contrast on 10/22/2016. This showed the 4 mm left parietal lesion to be unchanged there were stable to slightly worsening nearby punctate lesions along the surface of the brain compatible with leptomeningeal metastatic disease. However the increased conspicuity of these lesions was felt possibly to be due to technical differences in the studies.  The brain MRI was reviewed at  the brain tumor conference and felt to be stable, requiring only further follow-up.  She is here today for continuing treatment and  discussion of the findings on the chest CT scan   REVIEW OF SYSTEMS: Evelyna recently returned from a trip to [place] which they greatly enjoyed. However she has had more neck pain. 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 At present she finds that she is less able to move her neck and occasionally she has headaches associated with this. She has an appointment with Dr. Maryjean Ka in neurosurgery 12/08/2016. As far as the pain is concerned she rarely takes ibuprofen and sometimes takes a half an oxycodone at bedtime but does take the OxyContin 30 mg twice daily. She does become constipated with this and uses MiraLAX as needed. A detailed review of systems today was otherwise stable   PAST MEDICAL HISTORY: Past Medical History:  Diagnosis Date  . Anxiety   . Breast cancer ([place])    Stage IV, ER positive left upper outer quadrant breast cancer with metastasis to bone  . Breast cancer metastasized to multiple sites ([place]) 04/12/2016   bone, liver, lung and brain  . GERD (gastroesophageal reflux disease)   . Hypertension     PAST SURGICAL HISTORY: Past Surgical History:  Procedure Laterality Date  . CHOLECYSTECTOMY    . ESOPHAGOGASTRODUODENOSCOPY (EGD) WITH PROPOFOL N/A 07/12/2015   Procedure: ESOPHAGOGASTRODUODENOSCOPY (EGD) WITH PROPOFOL;  Surgeon: Rogene Houston, MD;  Location: [facility];  Service: Endoscopy;  Laterality: N/A;  1:00  . IR GENERIC HISTORICAL  04/22/2016   IR US GUIDE VASC ACCESS RIGHT 04/22/2016 Markus Daft, MD [facility]-INTER[facility] RAD  . IR GENERIC HISTORICAL  04/22/2016   IR FLUORO GUIDE PORT INSERTION RIGHT 04/22/2016 Markus Daft, MD [facility]  . MASTECTOMY  April 2009   Left breast with lymph node resection    FAMILY HISTORY Family History  Problem Relation Age of Onset  . Heart attack Mother     Died age 67  . Heart disease Father     Died age 22  . Brain cancer  Father     pt not sure if he actually had this  . Cancer Maternal Grandmother     dx. NOS cancer at older age; d. 33  . Heart attack Maternal Grandfather 79  . Colon cancer Paternal Grandmother     d. 46s  The patient's father died at age 49 she believes from heart disease. The patient's mother died at age 47 also from a myocardial infarction. The patient had one brother, no sisters. There is no history of breast or ovarian cancer in the family despite extensive G neurologic research   GYNECOLOGIC HISTORY:  No LMP recorded. Patient is not currently having periods (Reason: Chemotherapy).  menarche age 63, first live birth age 59, the patient is GX P1. She stopped having periods with chemotherapy in 2009 and these have not resumed.   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 They recently moved to [place] from [place]. Their son [name] is 28 years old as of November 2017 . The patient is [place] Orthodox      ADVANCED DIRECTIVES:  the patient's husband is her healthcare power of attorney. There is no living will in place    HEALTH MAINTENANCE: Social  History  Substance Use Topics  . Smoking status: Never Smoker  . Smokeless tobacco: Never Used  . Alcohol use No     Colonoscopy:  PAP:  Bone density:   No Known Allergies  Current Outpatient Prescriptions  Medication Sig Dispense Refill  . Oxycodone HCl 10 MG TABS Take 1 tablet (10 mg total) by mouth every 3 (three) hours as needed. 60 tablet 0  . docusate sodium (COLACE) 100 MG capsule Take 100 mg by mouth daily.     Marland Kitchen ibuprofen (ADVIL,MOTRIN) 200 MG tablet Take 400 mg by mouth daily as needed.    . lidocaine-prilocaine (EMLA) cream Apply 1 application topically as needed. To port 1 hour before going to be accessed with needle. Cover with plastic wrap. 30 g 1  . lisinopril (PRINIVIL,ZESTRIL) 10 MG tablet Take 1 tablet (10 mg  total) by mouth daily. (Patient not taking: Reported on 10/26/2016) 30 tablet 0  . oxyCODONE (OXYCONTIN) 30 MG 12 hr tablet Take 30 mg by mouth 2 (two) times daily. 60 each 0  . pantoprazole (PROTONIX) 40 MG tablet TAKE 1 TABLET(40 MG) BY MOUTH TWICE DAILY BEFORE A MEAL 60 tablet 5  . polyethylene glycol (MIRALAX / GLYCOLAX) packet Take 17 grams by mouth every day. 14 each 6   No current facility-administered medications for this visit.     OBJECTIVE: Middle-aged white woman who appears younger than stated age  67:   10/30/16 1059  BP: 130/89  Pulse: 87  Resp: 18  Temp: 97.8 F (36.6 C)     Body mass index is 23.75 kg/m.    ECOG FS:1 - Symptomatic but completely ambulatory   Sclerae unicteric, EOMs intact Oropharynx clear and moist No cervical or supraclavicular adenopathy; neck range of motion limited by discomfort Lungs no rales or rhonchi Heart regular rate and rhythm Abd soft, nontender, positive bowel sounds MSK no focal spinal tenderness, no upper extremity lymphedema Neuro: nonfocal, well oriented, appropriate affect Breasts: Deferred   LAB RESULTS:  CMP     Component Value Date/Time   NA 141 10/30/2016 1021   K 4.0 10/30/2016 1021   CL 105 05/20/2016 0930   CO2 26 10/30/2016 1021   GLUCOSE 119 10/30/2016 1021   BUN 9.0 10/30/2016 1021   CREATININE 0.7 10/30/2016 1021   CALCIUM 8.6 10/30/2016 1021   PROT 6.6 10/30/2016 1021   ALBUMIN 3.1 (L) 10/30/2016 1021   AST 48 (H) 10/30/2016 1021   ALT 32 10/30/2016 1021   ALKPHOS 138 10/30/2016 1021   BILITOT 0.46 10/30/2016 1021   GFRNONAA >60 05/20/2016 0930   GFRAA >60 05/20/2016 0930    INo results found for: SPEP, UPEP  Lab Results  Component Value Date   WBC 2.9 (L) 10/30/2016   NEUTROABS 1.7 10/30/2016   HGB 12.3 10/30/2016   HCT 37.7 10/30/2016   MCV 98.8 10/30/2016   PLT 231 10/30/2016      Chemistry      Component Value Date/Time   NA 141 10/30/2016 1021   K 4.0 10/30/2016 1021   CL  105 05/20/2016 0930   CO2 26 10/30/2016 1021   BUN 9.0 10/30/2016 1021   CREATININE 0.7 10/30/2016 1021      Component Value Date/Time   CALCIUM 8.6 10/30/2016 1021   ALKPHOS 138 10/30/2016 1021   AST 48 (H) 10/30/2016 1021   ALT 32 10/30/2016 1021   BILITOT 0.46 10/30/2016 1021       Lab Results  Component Value Date  LABCA2 98 (H) 02/27/2016    No components found for: [id number]  No results for input(s): INR in the last 168 hours.  Urinalysis    Component Value Date/Time   COLORURINE ORANGE (A) 05/17/2016 0852   APPEARANCEUR CLOUDY (A) 05/17/2016 0852   LABSPEC 1.027 05/17/2016 0852   PHURINE 6.0 05/17/2016 0852   GLUCOSEU NEGATIVE 05/17/2016 0852   HGBUR NEGATIVE 05/17/2016 0852   BILIRUBINUR SMALL (A) 05/17/2016 0852   KETONESUR NEGATIVE 05/17/2016 0852   PROTEINUR 30 (A) 05/17/2016 0852   NITRITE NEGATIVE 05/17/2016 0852   LEUKOCYTESUR TRACE (A) 05/17/2016 0852      Ref Range & Units 3wk ago 768moago 231mogo 68m91moo   CA 27.29 0.0 - 38.6 U/mL 48.1   49.7CM   54.3CM   178.8CM    Comments: [facility]       STUDIES: Ct Chest W Contrast  Result Date: 10/16/2016 CLINICAL DATA:  Breast cancer diagnosed 2008 (516 hypo brain metastasis. History of liver metastasis skeletal metastasis EXAM: CT CHEST WITH CONTRAST TECHNIQUE: Multidetector CT imaging of the chest was performed during intravenous contrast administration. CONTRAST:  79m5mOVUE-300 IOPAMIDOL (ISOVUE-300) INJECTION 61% COMPARISON:  PET-CT 03/16/2016, CT 07/16/2016 FINDINGS: Cardiovascular: Port in the RIGHT chest wall. No significant vascular findings. Normal heart size. No pericardial effusion. Mediastinum/Nodes: No axillary supraclavicular adenopathy. No mediastinal hilar adenopathy. Esophagus normal. Lungs/Pleura: RIGHT upper lobe 5 mm nodule (image 28 series 5) compares to 5 mm. Subpleural reticulation in the LEFT upper lobe relates radiation therapy and not changed.  Bands of linear atelectasis the lung bases are not changed. No new new or suspicious nodularity. Upper Abdomen: Lesion in the posterior RIGHT hepatic lobe is poorly defined measure approximately 3.3 cm compared with 3.5 cm on prior. The entire liver is not imaged. Adrenal glands normal. No upper abdominal adenopathy. Musculoskeletal: Diffuse sclerotic metastasis within the spine are similar. Several Focal sclerotic lesions in the sternum and manubrium are new. Small 5 mm sclerotic lesion in the manubrium (image 38, series 2) a new 2 mm lesion on image 40, series 2. IMPRESSION: 1. No evidence of pulmonary metastasis. 2. New small sclerotic lesions within the sternum. 3. Stable diffuse sclerotic metastasis within the spine. 4. No significant change in liver metastasis. Electronically Signed   By: StewSuzy Bouchard.   On: 10/16/2016 07:56   Mr BraiJeri CosCLYtrast  Result Date: 10/22/2016 CLINICAL DATA:  Metastatic breast cancer. Whole-brain radiation therapy 9-03/2016. EXAM: MRI HEAD WITHOUT AND WITH CONTRAST TECHNIQUE: Multiplanar, multiecho pulse sequences of the brain and surrounding structures were obtained without and with intravenous contrast. 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 There is no evidence of acute infarct, intracranial hemorrhage, midline shift, or extra-axial fluid collection. The ventricles are normal in size. Patchy to confluent cerebral white matter T2 hyperintensities have increased and are nonspecific but may reflect progressive post treatment changes. 4 mm enhancing lesion associated with the dura in the left parietal region is unchanged (series 10, image 111). Similar but smaller punctate foci of nodular enhancement immediately adjacent to this lesion either associated with the dura or along the surface of the brain (Series 10 images 103-106) are much more conspicuous  compared to the axial sequence from the prior study but are more similar in appearance on the coronal sequences. No definite new intracranial metastatic disease is identified remote from this region.  Smooth dural thickening over the left cerebral convexity is overall slightly less prominent than on the prior study. Vascular: Major intracranial vascular flow voids are preserved. Skull and upper cervical spine: Similar appearance of diffusely heterogeneous calvarial and upper cervical spine bone marrow signal with patchy enhancement. A chronic C3 superior endplate fracture is again noted. Sinuses/Orbits: Unremarkable orbits. Minimal paranasal sinus mucosal thickening. Bilateral mastoid effusions. Other: None. IMPRESSION: 1. Unchanged 4 mm left parietal lesion with stable to slight worsening of nearby punctate lesions along the surface of the brain, compatible with leptomeningeal metastatic disease. Increased conspicuity of the small lesions may be technical (3T imaging and thinner slices). 2. Slight improvement of smooth dural thickening over the left cerebral convexity. 3. Progressive cerebral white matter T2 signal abnormality likely reflecting post treatment changes. 4. Diffuse osseous metastases. Electronically Signed   By: Logan Bores M.D.   On: 10/22/2016 14:03    ELIGIBLE FOR AVAILABLE RESEARCH PROTOCOL: no  ASSESSMENT: 51 y.o. BRCA negative [place] woman originally from [place]  (1) status post left breast upper inner quadrant biopsy 05/17/2007 for a clinical mT2 N2, stage IIIA invasive ductal breast cancer, strongly estrogen receptor positive, weakly progesterone receptor positive, HER-2 negative, with an MIB-1 of 52%  (2) neoadjuvant chemotherapy consisted of dose dense doxorubicin and cyclophosphamide 4 followed by weekly paclitaxel 12, started 06/10/2007, completed 09/28/2007  (3) status post left modified radical mastectomy 10/27/2007 at [facility] [phone]) removed that invasive ductal  carcinoma, grade 2, pT2, pN1a (downstaged to IIB), estrogen receptor strongly positive, progesterone receptor weakly positive, HER-2 not amplified by immunohistochemistry or FISH with a signals ratio of 0.94, number per cell 3.4  (4) status post adjuvant radiation to the left chest wall as well as the left supraclavicular and axillary nodal regions (50.4 gray +10 Gray boost) given between 12/15/2007 on 01/31/2008  (5) on adjuvant tamoxifen 02/20/2008 through March 2016  METASTATIC DISEASE: March 2016, presenting with bone pain (6) staging studies April 2016 showed diffuse bony metastatic disease, intrathoracic adenopathy, and small lung lesions.  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, estrogen receptor strongly positive, progesterone receptor and HER-2 negative  (b) CA-27-29 is informative  (7) denosumab/Xgeva started April 2016,  (a) zolendronate started   (8) letrozole/palbociclib started April 2016,  discontinued September 2017 with progression  (a) restaging studies September 2017 document bone, lung, and liver involvement  (b) brain MRI September 2017 c/w leptomeningeal spread, +/- parietal metastases  (9) whole brain irradiation 03/30/16 - 04/10/16 Site/dose:   The whole brain was treated to 30 Gy in 10 fractions of 3 Gy.  (a) brain MRI 07/16/2016 interpreted as requiring only further follow-up  (b) brain MRI 10/22/2016 read as stable  (10) repeat genetic testing 04/23/2016 through the [facility] Hereditary Cancer Panel offered by [facility] found no deleterious mutations in APC, ATM, BARD1, BMPR1A, BRCA1, BRCA2, BRIP1, CHD1, CDK4, CDKN2A, CHEK2, EPCAM (large rearrangement only), GREM1/SCG5, MLH1, MSH2, MSH6, MUTYH, NBN, PALB2, PMS2, POLD1, POLE, PTEN, RAD51C, RAD51D, SMAD4, STK11, and TP53  (11) gemcitabine started 04/24/2016,  given days 1 and 8 of each 21 day cycle  (a) day 8 cycle 1 delayed because of cytopenias--dose reduced and  neupogen added; refuses neulasta/onpro  (b) CT scan of the chest 07/16/2016 shows decrease in the size of liver metastases.  (c) chest CT 10/15/2016 shows liver metastases to be stable, no lung metastases; new sternal mets?   PLAN: I spent approximately 30 minutes with Juliann Mule with most of that time spent discussing her  complex problems. We reviewed the fact that her metastatic breast cancer has to components, the central nervous system and the peripheral component. As far as his central nervous system is concerned, she will need a repeat brain MRI in July but for now in the absence of any new symptomsspecific treatment is needed.  As far as the peripheral component is concerned she is tolerating Gemzar well and it is controlling her liver lesions which are the more immediately life-threatening ones. The bone lesions generally are also well controlled except that there are new ones on her sternum. In fact she has missed some denosumab/Xgeva doses because of travel and other issues.  I suggested we switch from denosumab/Xgeva to zolendronate, which she would receive every 3 months. She understands the side effects, toxicities and complications of this agent are pretty much the same as with Xgeva and can include osteonecrosis of the jaw as well as significant hypocalcemia issues. She is willing to make the change. She will receive her first dose with the 8 treatment on this cycle, 11/06/2016  She refuses Neulasta and therefore will continue to receive Neupogen on days 4 and 5 of each Gemzar dose, which she receives days 1 and 8 of each 21 day cycle.  The neck problem is more symptomatic. I think she should have a soft collar and I wrote her the prescription for that. She does have an appointment with neurosurgery already for mid June which was the earliest that could be scheduled for her.  The plan then is to continue treatment as we are doing, with follow-up on day 1 of each 21 day cycle, continuing  monitoring of her CA-27-29, and repeat a chest CT and brain MRI in late July  Marijuana has a good understanding of this plan. She agrees with it. She knows the goal of treatment in her case is control. She will call with any problems that may develop before her next visit here.      [name], MD   11/01/2016 12:20 PM Medical Oncology and Hematology [facility] [address] [place], [place] [address] Tel. [phone]    Fax. [phone]

## 2016-10-30 NOTE — Patient Instructions (Signed)
[facility] Discharge Instructions for Patients Receiving Chemotherapy  Today you received the following chemotherapy agents Gemzar.  To help prevent nausea and vomiting after your treatment, we encourage you to take your nausea medication.   If you develop nausea and vomiting that is not controlled by your nausea medication, call the clinic.   BELOW ARE SYMPTOMS THAT SHOULD BE REPORTED IMMEDIATELY:  *FEVER GREATER THAN 100.5 F  *CHILLS WITH OR WITHOUT FEVER  NAUSEA AND VOMITING THAT IS NOT CONTROLLED WITH YOUR NAUSEA MEDICATION  *UNUSUAL SHORTNESS OF BREATH  *UNUSUAL BRUISING OR BLEEDING  TENDERNESS IN MOUTH AND THROAT WITH OR WITHOUT PRESENCE OF ULCERS  *URINARY PROBLEMS  *BOWEL PROBLEMS  UNUSUAL RASH Items with * indicate a potential emergency and should be followed up as soon as possible.  Feel free to call the clinic you have any questions or concerns. The clinic phone number is [phone].  Please show the CHEMO ALERT CARD at check-in to the Emergency Department and triage nurse.   

## 2016-11-02 ENCOUNTER — Ambulatory Visit (HOSPITAL_BASED_OUTPATIENT_CLINIC_OR_DEPARTMENT_OTHER): Payer: BLUE CROSS/BLUE SHIELD

## 2016-11-02 VITALS — BP 111/83 | HR 110 | Temp 98.2°F | Resp 18

## 2016-11-02 DIAGNOSIS — C787 Secondary malignant neoplasm of liver and intrahepatic bile duct: Secondary | ICD-10-CM

## 2016-11-02 DIAGNOSIS — C7802 Secondary malignant neoplasm of left lung: Secondary | ICD-10-CM | POA: Diagnosis not present

## 2016-11-02 DIAGNOSIS — C7949 Secondary malignant neoplasm of other parts of nervous system: Secondary | ICD-10-CM

## 2016-11-02 DIAGNOSIS — C7951 Secondary malignant neoplasm of bone: Secondary | ICD-10-CM | POA: Diagnosis not present

## 2016-11-02 DIAGNOSIS — Z5189 Encounter for other specified aftercare: Secondary | ICD-10-CM

## 2016-11-02 DIAGNOSIS — C50912 Malignant neoplasm of unspecified site of left female breast: Secondary | ICD-10-CM

## 2016-11-02 LAB — CANCER ANTIGEN 27.29: CAN 27.29: 61 U/mL — AB (ref 0.0–38.6)

## 2016-11-02 MED ORDER — TBO-FILGRASTIM 300 MCG/0.5ML ~~LOC~~ SOSY
300.0000 ug | PREFILLED_SYRINGE | Freq: Once | SUBCUTANEOUS | Status: AC
  Administered 2016-11-02: 300 ug via SUBCUTANEOUS
  Filled 2016-11-02: qty 0.5

## 2016-11-02 NOTE — Patient Instructions (Signed)
Tbo-Filgrastim injection What is this medicine? TBO-FILGRASTIM (T B O fil GRA stim) is a granulocyte colony-stimulating factor that stimulates the growth of neutrophils, a type of white blood cell important in the body's fight against infection. It is used to reduce the incidence of fever and infection in patients with certain types of cancer who are receiving chemotherapy that affects the bone marrow. This medicine may be used for other purposes; ask your health care provider or pharmacist if you have questions. COMMON BRAND NAME(S): Granix What should I tell my health care provider before I take this medicine? They need to know if you have any of these conditions: -bone scan or tests planned -kidney disease -sickle cell anemia -an unusual or allergic reaction to tbo-filgrastim, filgrastim, pegfilgrastim, other medicines, foods, dyes, or preservatives -pregnant or trying to get pregnant -breast-feeding How should I use this medicine? This medicine is for injection under the skin. If you get this medicine at home, you will be taught how to prepare and give this medicine. Refer to the Instructions for Use that come with your medication packaging. Use exactly as directed. Take your medicine at regular intervals. Do not take your medicine more often than directed. It is important that you put your used needles and syringes in a special sharps container. Do not put them in a trash can. If you do not have a sharps container, call your pharmacist or healthcare provider to get one. Talk to your pediatrician regarding the use of this medicine in children. Special care may be needed. Overdosage: If you think you have taken too much of this medicine contact a poison control center or emergency room at once. NOTE: This medicine is only for you. Do not share this medicine with others. What if I miss a dose? It is important not to miss your dose. Call your doctor or health care professional if you miss a  dose. What may interact with this medicine? This medicine may interact with the following medications: -medicines that may cause a release of neutrophils, such as lithium This list may not describe all possible interactions. Give your health care provider a list of all the medicines, herbs, non-prescription drugs, or dietary supplements you use. Also tell them if you smoke, drink alcohol, or use illegal drugs. Some items may interact with your medicine. What should I watch for while using this medicine? You may need blood work done while you are taking this medicine. What side effects may I notice from receiving this medicine? Side effects that you should report to your doctor or health care professional as soon as possible: -allergic reactions like skin rash, itching or hives, swelling of the face, lips, or tongue -blood in the urine -dark urine -dizziness -fast heartbeat -feeling faint -shortness of breath or breathing problems -signs and symptoms of infection like fever or chills; cough; or sore throat -signs and symptoms of kidney injury like trouble passing urine or change in the amount of urine -stomach or side pain, or pain at the shoulder -sweating -swelling of the legs, ankles, or abdomen -tiredness Side effects that usually do not require medical attention (report to your doctor or health care professional if they continue or are bothersome): -bone pain -headache -muscle pain -vomiting This list may not describe all possible side effects. Call your doctor for medical advice about side effects. You may report side effects to FDA at 1-800-FDA-1088. Where should I keep my medicine? Keep out of the reach of children. Store in a refrigerator between   2 and 8 degrees C (36 and 46 degrees F). Keep in carton to protect from light. Throw away this medicine if it is left out of the refrigerator for more than 5 consecutive days. Throw away any unused medicine after the expiration  date. NOTE: This sheet is a summary. It may not cover all possible information. If you have questions about this medicine, talk to your doctor, pharmacist, or health care provider.  2018 Elsevier/Gold Standard (2015-08-05 19:07:04)  

## 2016-11-03 ENCOUNTER — Ambulatory Visit (HOSPITAL_BASED_OUTPATIENT_CLINIC_OR_DEPARTMENT_OTHER): Payer: BLUE CROSS/BLUE SHIELD

## 2016-11-03 VITALS — HR 97 | Temp 97.9°F | Resp 20

## 2016-11-03 DIAGNOSIS — Z5189 Encounter for other specified aftercare: Secondary | ICD-10-CM

## 2016-11-03 DIAGNOSIS — C50912 Malignant neoplasm of unspecified site of left female breast: Secondary | ICD-10-CM

## 2016-11-03 DIAGNOSIS — C7951 Secondary malignant neoplasm of bone: Principal | ICD-10-CM

## 2016-11-03 MED ORDER — TBO-FILGRASTIM 300 MCG/0.5ML ~~LOC~~ SOSY
300.0000 ug | PREFILLED_SYRINGE | Freq: Once | SUBCUTANEOUS | Status: AC
  Administered 2016-11-03: 300 ug via SUBCUTANEOUS
  Filled 2016-11-03: qty 0.5

## 2016-11-03 NOTE — Patient Instructions (Signed)
Tbo-Filgrastim injection What is this medicine? TBO-FILGRASTIM (T B O fil GRA stim) is a granulocyte colony-stimulating factor that stimulates the growth of neutrophils, a type of white blood cell important in the body's fight against infection. It is used to reduce the incidence of fever and infection in patients with certain types of cancer who are receiving chemotherapy that affects the bone marrow. This medicine may be used for other purposes; ask your health care provider or pharmacist if you have questions. COMMON BRAND NAME(S): Granix What should I tell my health care provider before I take this medicine? They need to know if you have any of these conditions: -bone scan or tests planned -kidney disease -sickle cell anemia -an unusual or allergic reaction to tbo-filgrastim, filgrastim, pegfilgrastim, other medicines, foods, dyes, or preservatives -pregnant or trying to get pregnant -breast-feeding How should I use this medicine? This medicine is for injection under the skin. If you get this medicine at home, you will be taught how to prepare and give this medicine. Refer to the Instructions for Use that come with your medication packaging. Use exactly as directed. Take your medicine at regular intervals. Do not take your medicine more often than directed. It is important that you put your used needles and syringes in a special sharps container. Do not put them in a trash can. If you do not have a sharps container, call your pharmacist or healthcare provider to get one. Talk to your pediatrician regarding the use of this medicine in children. Special care may be needed. Overdosage: If you think you have taken too much of this medicine contact a poison control center or emergency room at once. NOTE: This medicine is only for you. Do not share this medicine with others. What if I miss a dose? It is important not to miss your dose. Call your doctor or health care professional if you miss a  dose. What may interact with this medicine? This medicine may interact with the following medications: -medicines that may cause a release of neutrophils, such as lithium This list may not describe all possible interactions. Give your health care provider a list of all the medicines, herbs, non-prescription drugs, or dietary supplements you use. Also tell them if you smoke, drink alcohol, or use illegal drugs. Some items may interact with your medicine. What should I watch for while using this medicine? You may need blood work done while you are taking this medicine. What side effects may I notice from receiving this medicine? Side effects that you should report to your doctor or health care professional as soon as possible: -allergic reactions like skin rash, itching or hives, swelling of the face, lips, or tongue -blood in the urine -dark urine -dizziness -fast heartbeat -feeling faint -shortness of breath or breathing problems -signs and symptoms of infection like fever or chills; cough; or sore throat -signs and symptoms of kidney injury like trouble passing urine or change in the amount of urine -stomach or side pain, or pain at the shoulder -sweating -swelling of the legs, ankles, or abdomen -tiredness Side effects that usually do not require medical attention (report to your doctor or health care professional if they continue or are bothersome): -bone pain -headache -muscle pain -vomiting This list may not describe all possible side effects. Call your doctor for medical advice about side effects. You may report side effects to FDA at 1-800-FDA-1088. Where should I keep my medicine? Keep out of the reach of children. Store in a refrigerator between   2 and 8 degrees C (36 and 46 degrees F). Keep in carton to protect from light. Throw away this medicine if it is left out of the refrigerator for more than 5 consecutive days. Throw away any unused medicine after the expiration  date. NOTE: This sheet is a summary. It may not cover all possible information. If you have questions about this medicine, talk to your doctor, pharmacist, or health care provider.  2018 Elsevier/Gold Standard (2015-08-05 19:07:04)  

## 2016-11-05 ENCOUNTER — Ambulatory Visit
Admission: RE | Admit: 2016-11-05 | Discharge: 2016-11-05 | Disposition: A | Discharge status: Home / Self Care | Payer: BLUE CROSS/BLUE SHIELD | Source: Ambulatory Visit | Attending: Radiation Oncology | Admitting: Radiation Oncology

## 2016-11-05 ENCOUNTER — Other Ambulatory Visit: Payer: BLUE CROSS/BLUE SHIELD

## 2016-11-05 ENCOUNTER — Other Ambulatory Visit: Payer: Self-pay | Admitting: Family Medicine

## 2016-11-05 DIAGNOSIS — C7951 Secondary malignant neoplasm of bone: Secondary | ICD-10-CM

## 2016-11-05 DIAGNOSIS — C50912 Malignant neoplasm of unspecified site of left female breast: Secondary | ICD-10-CM

## 2016-11-05 MED ORDER — GADOBENATE DIMEGLUMINE 529 MG/ML IV SOLN
13.0000 mL | Freq: Once | INTRAVENOUS | Status: AC | PRN
  Administered 2016-11-05: 13 mL via INTRAVENOUS

## 2016-11-06 ENCOUNTER — Ambulatory Visit (HOSPITAL_BASED_OUTPATIENT_CLINIC_OR_DEPARTMENT_OTHER): Payer: BLUE CROSS/BLUE SHIELD

## 2016-11-06 ENCOUNTER — Other Ambulatory Visit (HOSPITAL_BASED_OUTPATIENT_CLINIC_OR_DEPARTMENT_OTHER): Payer: BLUE CROSS/BLUE SHIELD

## 2016-11-06 VITALS — BP 126/95 | HR 78 | Temp 98.5°F | Resp 18

## 2016-11-06 DIAGNOSIS — C7802 Secondary malignant neoplasm of left lung: Secondary | ICD-10-CM

## 2016-11-06 DIAGNOSIS — C7949 Secondary malignant neoplasm of other parts of nervous system: Secondary | ICD-10-CM

## 2016-11-06 DIAGNOSIS — C50919 Malignant neoplasm of unspecified site of unspecified female breast: Secondary | ICD-10-CM

## 2016-11-06 DIAGNOSIS — C50912 Malignant neoplasm of unspecified site of left female breast: Secondary | ICD-10-CM | POA: Diagnosis not present

## 2016-11-06 DIAGNOSIS — Z5111 Encounter for antineoplastic chemotherapy: Secondary | ICD-10-CM | POA: Diagnosis not present

## 2016-11-06 DIAGNOSIS — C787 Secondary malignant neoplasm of liver and intrahepatic bile duct: Secondary | ICD-10-CM

## 2016-11-06 DIAGNOSIS — C7951 Secondary malignant neoplasm of bone: Secondary | ICD-10-CM | POA: Diagnosis not present

## 2016-11-06 DIAGNOSIS — C50922 Malignant neoplasm of unspecified site of left male breast: Secondary | ICD-10-CM

## 2016-11-06 LAB — COMPREHENSIVE METABOLIC PANEL
ALBUMIN: 2.9 g/dL — AB (ref 3.5–5.0)
ALT: 52 U/L (ref 0–55)
AST: 64 U/L — ABNORMAL HIGH (ref 5–34)
Alkaline Phosphatase: 160 U/L — ABNORMAL HIGH (ref 40–150)
Anion Gap: 6 mEq/L (ref 3–11)
BUN: 6.9 mg/dL — AB (ref 7.0–26.0)
CO2: 26 meq/L (ref 22–29)
CREATININE: 0.6 mg/dL (ref 0.6–1.1)
Calcium: 8.5 mg/dL (ref 8.4–10.4)
Chloride: 107 mEq/L (ref 98–109)
GLUCOSE: 94 mg/dL (ref 70–140)
Potassium: 4.2 mEq/L (ref 3.5–5.1)
SODIUM: 139 meq/L (ref 136–145)
TOTAL PROTEIN: 6.4 g/dL (ref 6.4–8.3)
Total Bilirubin: 0.36 mg/dL (ref 0.20–1.20)

## 2016-11-06 LAB — CBC WITH DIFFERENTIAL/PLATELET
BASO%: 1 % (ref 0.0–2.0)
Basophils Absolute: 0.1 10*3/uL (ref 0.0–0.1)
EOS ABS: 0.1 10*3/uL (ref 0.0–0.5)
EOS%: 1 % (ref 0.0–7.0)
HCT: 36.1 % (ref 34.8–46.6)
HEMOGLOBIN: 11.2 g/dL — AB (ref 11.6–15.9)
LYMPH%: 18.7 % (ref 14.0–49.7)
MCH: 31.3 pg (ref 25.1–34.0)
MCHC: 31 g/dL — ABNORMAL LOW (ref 31.5–36.0)
MCV: 100.8 fL (ref 79.5–101.0)
MONO#: 1.1 10*3/uL — AB (ref 0.1–0.9)
MONO%: 18.7 % — AB (ref 0.0–14.0)
NEUT%: 60.6 % (ref 38.4–76.8)
NEUTROS ABS: 3.6 10*3/uL (ref 1.5–6.5)
Platelets: 101 10*3/uL — ABNORMAL LOW (ref 145–400)
RBC: 3.58 10*6/uL — AB (ref 3.70–5.45)
RDW: 15.8 % — AB (ref 11.2–14.5)
WBC: 5.9 10*3/uL (ref 3.9–10.3)
lymph#: 1.1 10*3/uL (ref 0.9–3.3)

## 2016-11-06 MED ORDER — ONDANSETRON HCL 4 MG/2ML IJ SOLN
INTRAMUSCULAR | Status: AC
  Filled 2016-11-06: qty 4

## 2016-11-06 MED ORDER — SODIUM CHLORIDE 0.9% FLUSH
10.0000 mL | INTRAVENOUS | Status: DC | PRN
  Administered 2016-11-06: 10 mL
  Filled 2016-11-06: qty 10

## 2016-11-06 MED ORDER — HEPARIN SOD (PORK) LOCK FLUSH 100 UNIT/ML IV SOLN
500.0000 [IU] | Freq: Once | INTRAVENOUS | Status: AC | PRN
  Administered 2016-11-06: 500 [IU]
  Filled 2016-11-06: qty 5

## 2016-11-06 MED ORDER — ONDANSETRON HCL 4 MG/2ML IJ SOLN
8.0000 mg | Freq: Once | INTRAMUSCULAR | Status: AC
  Administered 2016-11-06: 8 mg via INTRAVENOUS

## 2016-11-06 MED ORDER — SODIUM CHLORIDE 0.9 % IV SOLN
Freq: Once | INTRAVENOUS | Status: AC
  Administered 2016-11-06: 14:00:00 via INTRAVENOUS

## 2016-11-06 MED ORDER — ZOLEDRONIC ACID 4 MG/100ML IV SOLN
4.0000 mg | Freq: Once | INTRAVENOUS | Status: AC
  Administered 2016-11-06: 4 mg via INTRAVENOUS
  Filled 2016-11-06: qty 100

## 2016-11-06 MED ORDER — SODIUM CHLORIDE 0.9 % IV SOLN
600.0000 mg/m2 | Freq: Once | INTRAVENOUS | Status: AC
  Administered 2016-11-06: 1064 mg via INTRAVENOUS
  Filled 2016-11-06: qty 27.98

## 2016-11-06 NOTE — Progress Notes (Signed)
Labs reviewed with Dr. Lindi Adie; okay to treat with alk phos 160, AST 64, and platelets 101.

## 2016-11-06 NOTE — Patient Instructions (Signed)
[facility] Discharge Instructions for Patients Receiving Chemotherapy  Today you received the following chemotherapy agents Gemzar.  To help prevent nausea and vomiting after your treatment, we encourage you to take your nausea medication.   If you develop nausea and vomiting that is not controlled by your nausea medication, call the clinic.   BELOW ARE SYMPTOMS THAT SHOULD BE REPORTED IMMEDIATELY:  *FEVER GREATER THAN 100.5 F  *CHILLS WITH OR WITHOUT FEVER  NAUSEA AND VOMITING THAT IS NOT CONTROLLED WITH YOUR NAUSEA MEDICATION  *UNUSUAL SHORTNESS OF BREATH  *UNUSUAL BRUISING OR BLEEDING  TENDERNESS IN MOUTH AND THROAT WITH OR WITHOUT PRESENCE OF ULCERS  *URINARY PROBLEMS  *BOWEL PROBLEMS  UNUSUAL RASH Items with * indicate a potential emergency and should be followed up as soon as possible.  Feel free to call the clinic you have any questions or concerns. The clinic phone number is [phone].  Please show the CHEMO ALERT CARD at check-in to the Emergency Department and triage nurse.   

## 2016-11-07 LAB — CANCER ANTIGEN 27.29: CA 27.29: 62 U/mL — ABNORMAL HIGH (ref 0.0–38.6)

## 2016-11-09 ENCOUNTER — Telehealth: Payer: Self-pay | Admitting: Radiation Oncology

## 2016-11-09 ENCOUNTER — Other Ambulatory Visit: Payer: Self-pay | Admitting: Oncology

## 2016-11-09 ENCOUNTER — Ambulatory Visit (HOSPITAL_BASED_OUTPATIENT_CLINIC_OR_DEPARTMENT_OTHER): Payer: BLUE CROSS/BLUE SHIELD

## 2016-11-09 VITALS — BP 115/80 | HR 99 | Temp 98.0°F | Resp 20

## 2016-11-09 DIAGNOSIS — C7802 Secondary malignant neoplasm of left lung: Secondary | ICD-10-CM | POA: Diagnosis not present

## 2016-11-09 DIAGNOSIS — Z5189 Encounter for other specified aftercare: Secondary | ICD-10-CM | POA: Diagnosis not present

## 2016-11-09 DIAGNOSIS — C787 Secondary malignant neoplasm of liver and intrahepatic bile duct: Secondary | ICD-10-CM

## 2016-11-09 DIAGNOSIS — C7949 Secondary malignant neoplasm of other parts of nervous system: Secondary | ICD-10-CM

## 2016-11-09 DIAGNOSIS — C50912 Malignant neoplasm of unspecified site of left female breast: Secondary | ICD-10-CM | POA: Diagnosis not present

## 2016-11-09 DIAGNOSIS — C7951 Secondary malignant neoplasm of bone: Secondary | ICD-10-CM | POA: Diagnosis not present

## 2016-11-09 MED ORDER — TBO-FILGRASTIM 300 MCG/0.5ML ~~LOC~~ SOSY
300.0000 ug | PREFILLED_SYRINGE | Freq: Once | SUBCUTANEOUS | Status: AC
  Administered 2016-11-09: 300 ug via SUBCUTANEOUS
  Filled 2016-11-09: qty 0.5

## 2016-11-09 NOTE — Telephone Encounter (Signed)
I spoke with the patient and let her know her MRI results and that recommendations from neurosurgery involved no need to meet with Dr. Maryjean Ka, but rather to consider radiotherapy. I will discuss this further with Dr. [name] and get back with her by Wednesday.

## 2016-11-09 NOTE — Progress Notes (Unsigned)
There is a slight upward trend in the tumor marker for [facility]. I think this may well be because of her missing some of the denosumab treatments. The alkaline phosphatase is up slightly as a result. We will see how she does now that we have resumed dose on a monthly basis.

## 2016-11-10 ENCOUNTER — Ambulatory Visit: Payer: BLUE CROSS/BLUE SHIELD

## 2016-11-11 ENCOUNTER — Telehealth: Payer: Self-pay | Admitting: Radiation Oncology

## 2016-11-11 NOTE — Telephone Encounter (Signed)
I called the patient and let her know that Dr. [name] felt she could receive 30 Gy in 10 fractions for pain relief. She will come for sim and our office will coordinate that time. We reviewed risks, benefits, short, and long term effects of treatment. She would like to proceed.

## 2016-11-12 ENCOUNTER — Ambulatory Visit (HOSPITAL_BASED_OUTPATIENT_CLINIC_OR_DEPARTMENT_OTHER): Payer: BLUE CROSS/BLUE SHIELD

## 2016-11-12 VITALS — BP 130/90 | HR 81 | Temp 97.0°F | Resp 18

## 2016-11-12 DIAGNOSIS — C50912 Malignant neoplasm of unspecified site of left female breast: Secondary | ICD-10-CM | POA: Diagnosis not present

## 2016-11-12 DIAGNOSIS — C7949 Secondary malignant neoplasm of other parts of nervous system: Secondary | ICD-10-CM

## 2016-11-12 DIAGNOSIS — C787 Secondary malignant neoplasm of liver and intrahepatic bile duct: Secondary | ICD-10-CM | POA: Diagnosis not present

## 2016-11-12 DIAGNOSIS — C7951 Secondary malignant neoplasm of bone: Secondary | ICD-10-CM | POA: Diagnosis not present

## 2016-11-12 DIAGNOSIS — Z5189 Encounter for other specified aftercare: Secondary | ICD-10-CM | POA: Diagnosis not present

## 2016-11-12 DIAGNOSIS — C7802 Secondary malignant neoplasm of left lung: Secondary | ICD-10-CM

## 2016-11-12 MED ORDER — TBO-FILGRASTIM 300 MCG/0.5ML ~~LOC~~ SOSY
300.0000 ug | PREFILLED_SYRINGE | Freq: Once | SUBCUTANEOUS | Status: AC
  Administered 2016-11-12: 300 ug via SUBCUTANEOUS
  Filled 2016-11-12: qty 0.5

## 2016-11-12 NOTE — Patient Instructions (Signed)
Tbo-Filgrastim injection What is this medicine? TBO-FILGRASTIM (T B O fil GRA stim) is a granulocyte colony-stimulating factor that stimulates the growth of neutrophils, a type of white blood cell important in the body's fight against infection. It is used to reduce the incidence of fever and infection in patients with certain types of cancer who are receiving chemotherapy that affects the bone marrow. This medicine may be used for other purposes; ask your health care provider or pharmacist if you have questions. COMMON BRAND NAME(S): Granix What should I tell my health care provider before I take this medicine? They need to know if you have any of these conditions: -bone scan or tests planned -kidney disease -sickle cell anemia -an unusual or allergic reaction to tbo-filgrastim, filgrastim, pegfilgrastim, other medicines, foods, dyes, or preservatives -pregnant or trying to get pregnant -breast-feeding How should I use this medicine? This medicine is for injection under the skin. If you get this medicine at home, you will be taught how to prepare and give this medicine. Refer to the Instructions for Use that come with your medication packaging. Use exactly as directed. Take your medicine at regular intervals. Do not take your medicine more often than directed. It is important that you put your used needles and syringes in a special sharps container. Do not put them in a trash can. If you do not have a sharps container, call your pharmacist or healthcare provider to get one. Talk to your pediatrician regarding the use of this medicine in children. Special care may be needed. Overdosage: If you think you have taken too much of this medicine contact a poison control center or emergency room at once. NOTE: This medicine is only for you. Do not share this medicine with others. What if I miss a dose? It is important not to miss your dose. Call your doctor or health care professional if you miss a  dose. What may interact with this medicine? This medicine may interact with the following medications: -medicines that may cause a release of neutrophils, such as lithium This list may not describe all possible interactions. Give your health care provider a list of all the medicines, herbs, non-prescription drugs, or dietary supplements you use. Also tell them if you smoke, drink alcohol, or use illegal drugs. Some items may interact with your medicine. What should I watch for while using this medicine? You may need blood work done while you are taking this medicine. What side effects may I notice from receiving this medicine? Side effects that you should report to your doctor or health care professional as soon as possible: -allergic reactions like skin rash, itching or hives, swelling of the face, lips, or tongue -blood in the urine -dark urine -dizziness -fast heartbeat -feeling faint -shortness of breath or breathing problems -signs and symptoms of infection like fever or chills; cough; or sore throat -signs and symptoms of kidney injury like trouble passing urine or change in the amount of urine -stomach or side pain, or pain at the shoulder -sweating -swelling of the legs, ankles, or abdomen -tiredness Side effects that usually do not require medical attention (report to your doctor or health care professional if they continue or are bothersome): -bone pain -headache -muscle pain -vomiting This list may not describe all possible side effects. Call your doctor for medical advice about side effects. You may report side effects to FDA at 1-800-FDA-1088. Where should I keep my medicine? Keep out of the reach of children. Store in a refrigerator between   2 and 8 degrees C (36 and 46 degrees F). Keep in carton to protect from light. Throw away this medicine if it is left out of the refrigerator for more than 5 consecutive days. Throw away any unused medicine after the expiration  date. NOTE: This sheet is a summary. It may not cover all possible information. If you have questions about this medicine, talk to your doctor, pharmacist, or health care provider.  2018 Elsevier/Gold Standard (2015-08-05 19:07:04)  

## 2016-11-12 NOTE — Progress Notes (Signed)
  Radiation Oncology         [phone] ________________________________  Name: Jill Johnston MRN: [id number]  Date: 11/13/2016  DOB: 14-Jul-1965  SIMULATION AND TREATMENT PLANNING NOTE    ICD-9-CM ICD-10-CM   1. 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  Identity was confirmed.  All relevant records and images related to the planned course of therapy were reviewed.  The patient freely provided informed written consent to proceed with treatment after reviewing the details related to the planned course of therapy. The consent form was witnessed and verified by the simulation staff.  Then, the patient was set-up in a stable reproducible  supine position for radiation therapy.  CT images were obtained.  Surface markings were placed.  The CT images were loaded into the planning software.  Then the target and avoidance structures were contoured.  Treatment planning then occurred.  The radiation prescription was entered and confirmed.  Then, I designed and supervised the construction of a total of 3 medically necessary complex treatment devices, including a custom made thermoplastic mask used for immobilization and two complex multileaf collimators to cover the involved c-spine, while shielding the pharynx and ears.  Each [facility] is independently created to account for beam divergence.  The right and left lateral fields will be treated with 6 MV X-rays.  I have requested : Isodose Plan.    PLAN:  The C-spine mets at C3 and C6 will be treated to 30 Gy in 10 fractions.  ________________________________  Sheral Apley [name], M.D.

## 2016-11-13 ENCOUNTER — Ambulatory Visit
Admission: RE | Admit: 2016-11-13 | Discharge: 2016-11-13 | Disposition: A | Discharge status: Home / Self Care | Payer: BLUE CROSS/BLUE SHIELD | Source: Ambulatory Visit | Attending: Radiation Oncology | Admitting: Radiation Oncology

## 2016-11-13 ENCOUNTER — Other Ambulatory Visit: Payer: BLUE CROSS/BLUE SHIELD

## 2016-11-13 ENCOUNTER — Ambulatory Visit: Payer: BLUE CROSS/BLUE SHIELD

## 2016-11-13 DIAGNOSIS — C7951 Secondary malignant neoplasm of bone: Secondary | ICD-10-CM

## 2016-11-13 DIAGNOSIS — Z510 Encounter for antineoplastic radiation therapy: Secondary | ICD-10-CM | POA: Diagnosis not present

## 2016-11-13 DIAGNOSIS — C50912 Malignant neoplasm of unspecified site of left female breast: Secondary | ICD-10-CM

## 2016-11-17 DIAGNOSIS — Z510 Encounter for antineoplastic radiation therapy: Secondary | ICD-10-CM | POA: Diagnosis not present

## 2016-11-18 ENCOUNTER — Ambulatory Visit
Admission: RE | Admit: 2016-11-18 | Discharge: 2016-11-18 | Disposition: A | Discharge status: Home / Self Care | Payer: BLUE CROSS/BLUE SHIELD | Source: Ambulatory Visit | Attending: Radiation Oncology | Admitting: Radiation Oncology

## 2016-11-18 DIAGNOSIS — Z510 Encounter for antineoplastic radiation therapy: Secondary | ICD-10-CM | POA: Diagnosis not present

## 2016-11-19 ENCOUNTER — Other Ambulatory Visit: Payer: Self-pay | Admitting: Radiation Oncology

## 2016-11-19 ENCOUNTER — Ambulatory Visit
Admission: RE | Admit: 2016-11-19 | Discharge: 2016-11-19 | Disposition: A | Discharge status: Home / Self Care | Payer: BLUE CROSS/BLUE SHIELD | Source: Ambulatory Visit | Attending: Radiation Oncology | Admitting: Radiation Oncology

## 2016-11-19 DIAGNOSIS — Z510 Encounter for antineoplastic radiation therapy: Secondary | ICD-10-CM | POA: Diagnosis not present

## 2016-11-19 DIAGNOSIS — C7949 Secondary malignant neoplasm of other parts of nervous system: Secondary | ICD-10-CM

## 2016-11-19 MED ORDER — HYDROCODONE-ACETAMINOPHEN 7.5-325 MG/15ML PO SOLN: 15.0000 mL | Freq: Four times a day (QID) | ORAL | 0 refills | Status: DC | PRN

## 2016-11-20 ENCOUNTER — Ambulatory Visit: Payer: BLUE CROSS/BLUE SHIELD

## 2016-11-20 ENCOUNTER — Ambulatory Visit: Payer: BLUE CROSS/BLUE SHIELD | Admitting: Adult Health

## 2016-11-20 ENCOUNTER — Ambulatory Visit
Admission: RE | Admit: 2016-11-20 | Discharge: 2016-11-20 | Disposition: A | Discharge status: Home / Self Care | Payer: BLUE CROSS/BLUE SHIELD | Source: Ambulatory Visit | Attending: Radiation Oncology | Admitting: Radiation Oncology

## 2016-11-20 ENCOUNTER — Other Ambulatory Visit: Payer: BLUE CROSS/BLUE SHIELD

## 2016-11-20 DIAGNOSIS — Z510 Encounter for antineoplastic radiation therapy: Secondary | ICD-10-CM | POA: Diagnosis not present

## 2016-11-24 ENCOUNTER — Other Ambulatory Visit: Payer: Self-pay | Admitting: Radiation Oncology

## 2016-11-24 ENCOUNTER — Telehealth: Payer: Self-pay | Admitting: *Deleted

## 2016-11-24 ENCOUNTER — Ambulatory Visit
Admission: RE | Admit: 2016-11-24 | Discharge: 2016-11-24 | Disposition: A | Discharge status: Home / Self Care | Payer: BLUE CROSS/BLUE SHIELD | Source: Ambulatory Visit | Attending: Radiation Oncology | Admitting: Radiation Oncology

## 2016-11-24 DIAGNOSIS — Z510 Encounter for antineoplastic radiation therapy: Secondary | ICD-10-CM | POA: Diagnosis not present

## 2016-11-24 DIAGNOSIS — H539 Unspecified visual disturbance: Secondary | ICD-10-CM

## 2016-11-24 NOTE — Telephone Encounter (Signed)
CALLED PATIENT TO INFORM OF APPT. WITH DR. Herbie [place] GROAT ON 11-25-16- ARRIVAL TIME - 2:30 PM , SPOKE WITH PATIENT AND SHE IS AWARE OF THIS APPT.

## 2016-11-25 ENCOUNTER — Ambulatory Visit
Admission: RE | Admit: 2016-11-25 | Discharge: 2016-11-25 | Disposition: A | Discharge status: Home / Self Care | Payer: BLUE CROSS/BLUE SHIELD | Source: Ambulatory Visit | Attending: Radiation Oncology | Admitting: Radiation Oncology

## 2016-11-25 DIAGNOSIS — Z510 Encounter for antineoplastic radiation therapy: Secondary | ICD-10-CM | POA: Diagnosis not present

## 2016-11-26 ENCOUNTER — Ambulatory Visit
Admission: RE | Admit: 2016-11-26 | Discharge: 2016-11-26 | Disposition: A | Discharge status: Home / Self Care | Payer: BLUE CROSS/BLUE SHIELD | Source: Ambulatory Visit | Attending: Radiation Oncology | Admitting: Radiation Oncology

## 2016-11-26 DIAGNOSIS — Z510 Encounter for antineoplastic radiation therapy: Secondary | ICD-10-CM | POA: Diagnosis not present

## 2016-11-27 ENCOUNTER — Ambulatory Visit
Admission: RE | Admit: 2016-11-27 | Discharge: 2016-11-27 | Disposition: A | Discharge status: Home / Self Care | Payer: BLUE CROSS/BLUE SHIELD | Source: Ambulatory Visit | Attending: Radiation Oncology | Admitting: Radiation Oncology

## 2016-11-27 ENCOUNTER — Other Ambulatory Visit: Payer: BLUE CROSS/BLUE SHIELD

## 2016-11-27 ENCOUNTER — Ambulatory Visit: Payer: BLUE CROSS/BLUE SHIELD

## 2016-11-27 DIAGNOSIS — C7951 Secondary malignant neoplasm of bone: Principal | ICD-10-CM

## 2016-11-27 DIAGNOSIS — C50912 Malignant neoplasm of unspecified site of left female breast: Secondary | ICD-10-CM

## 2016-11-27 DIAGNOSIS — Z510 Encounter for antineoplastic radiation therapy: Secondary | ICD-10-CM | POA: Diagnosis not present

## 2016-11-27 MED ORDER — RADIAPLEXRX EX GEL
Freq: Once | CUTANEOUS | Status: AC
  Administered 2016-11-27: 15:00:00 via TOPICAL

## 2016-11-27 NOTE — Progress Notes (Signed)
Pt here for patient teaching.  Pt given Radiation and You booklet and Radiaplex gel.  Reviewed areas of pertinence such as fatigue, skin changes and throat changes . Pt able to give teach back of to pat skin and use unscented/gentle soap,apply Radiaplex bid and avoid applying anything to skin within 4 hours of treatment. Pt demonstrated understanding and verbalizes understanding of information given and will contact nursing with any questions or concerns.

## 2016-11-30 ENCOUNTER — Ambulatory Visit: Payer: BLUE CROSS/BLUE SHIELD

## 2016-11-30 ENCOUNTER — Other Ambulatory Visit: Payer: Self-pay | Admitting: *Deleted

## 2016-11-30 ENCOUNTER — Ambulatory Visit
Admission: RE | Admit: 2016-11-30 | Discharge: 2016-11-30 | Disposition: A | Discharge status: Home / Self Care | Payer: BLUE CROSS/BLUE SHIELD | Source: Ambulatory Visit | Attending: Radiation Oncology | Admitting: Radiation Oncology

## 2016-11-30 DIAGNOSIS — C7949 Secondary malignant neoplasm of other parts of nervous system: Secondary | ICD-10-CM

## 2016-11-30 DIAGNOSIS — Z510 Encounter for antineoplastic radiation therapy: Secondary | ICD-10-CM | POA: Diagnosis not present

## 2016-11-30 MED ORDER — OXYCODONE HCL ER 30 MG PO T12A: 30.0000 mg | EXTENDED_RELEASE_TABLET | Freq: Two times a day (BID) | ORAL | 0 refills | Status: DC

## 2016-12-01 ENCOUNTER — Ambulatory Visit
Admission: RE | Admit: 2016-12-01 | Discharge: 2016-12-01 | Disposition: A | Discharge status: Home / Self Care | Payer: BLUE CROSS/BLUE SHIELD | Source: Ambulatory Visit | Attending: Radiation Oncology | Admitting: Radiation Oncology

## 2016-12-01 ENCOUNTER — Ambulatory Visit: Payer: BLUE CROSS/BLUE SHIELD

## 2016-12-01 DIAGNOSIS — Z510 Encounter for antineoplastic radiation therapy: Secondary | ICD-10-CM | POA: Diagnosis not present

## 2016-12-02 ENCOUNTER — Ambulatory Visit
Admission: RE | Admit: 2016-12-02 | Discharge: 2016-12-02 | Disposition: A | Discharge status: Home / Self Care | Payer: BLUE CROSS/BLUE SHIELD | Source: Ambulatory Visit | Attending: Radiation Oncology | Admitting: Radiation Oncology

## 2016-12-02 ENCOUNTER — Encounter: Payer: Self-pay | Admitting: Radiation Oncology

## 2016-12-02 DIAGNOSIS — Z510 Encounter for antineoplastic radiation therapy: Secondary | ICD-10-CM | POA: Diagnosis not present

## 2016-12-02 NOTE — Progress Notes (Signed)
  Radiation Oncology         [phone] ________________________________  Name: Jill Johnston MRN: [id number]  Date: 12/02/2016  DOB: 08-11-1965  End of Treatment Note  Diagnosis:   Recurrent ER/ PR positive breast cancer with painful metastatic disease to the cervical spine  Indication for treatment:  Palliative       Radiation treatment dates:   11/18/16 - 12/02/16  Site/dose:   c-spine treated to 30 Gy with 10 fractions of 3 Gy  Beams/energy:   6 MV X-rays  Narrative: The patient tolerated radiation treatment relatively well.   She endorsed pain when swallowing and mild fatigue throughout treatment.  Plan: The patient has completed radiation treatment. The patient will return to radiation oncology clinic for routine followup in one month. I advised her to call or return sooner if she has any questions or concerns related to her recovery or treatment. ________________________________  Sheral Apley. [name], M.D.   This document serves as a record of services personally performed by [name], MD. It was created on his behalf by Linward Natal, a trained medical scribe. The creation of this record is based on the scribe's personal observations and the provider's statements to them. This document has been checked and approved by the attending provider.

## 2016-12-10 NOTE — Progress Notes (Signed)
Jill Johnston  [phone] [phone]     ID: Jill Johnston DOB: [birth year]  MR#: [id number]  MBT#:[id number]  Patient Care Team: Jill Cruel, MD as PCP - General (Oncology) Jill Pita, MD as Consulting Physician (Radiation Oncology) Jill Johnston as Consulting Physician (Internal Medicine) Jill Height, MD as Consulting Physician (Internal Medicine) Jill Hakim, MD as Consulting Physician (Anesthesiology) Jill Cruel, MD OTHER MD:  CHIEF COMPLAINT: Estrogen receptor positive metastatic breast cancer  CURRENT TREATMENT: Gemcitabine, zolendronate   BREAST CANCER HISTORY: From Jill Johnston's summary note 05/08/2016:  "Patient was in excellent health until diagnosed with multifocal cT2 cN2 Mx carcinoma upper inner quadrant left breast 05-17-2007. The breast cancer was ER + 100%, PR 1%, HER 2 IHC/FISH negative, Ki67 52%, BRCA 1/2 negative, initial CA 2729 WNL. She had neoadjuvant AC x4 taxol x12 dose dense from 06-10-2007 thru 09-28-2007 by Jill Johnston. 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 She had radiation to chest wall, supraclavicular region and left axilla 50.4 Gy with boost to mastectomy scar to 60.4 Gy, by Jill Johnston from 12-15-2007 thru 01-31-2008. She was on tamoxifen from 02-20-2008 thru 09-25-2014. She developed pain in ribs 08-2014, with CA 2729 52 and PET CT and MRI with diffuse bone metastatic disease, no cord compression, small lung lesions and intrathoracic adenopathy. 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 She began Denosumab 120 mcg monthly starting 4-7-201, and letrozole + palbociclib beginning4-12-2014. She was seen in consultation by Jill Johnston [phone]. Palbociclib dose decreased to 100 mg daily x 21 q  28 days due to neutropenia. CBC on 10-25-15: WBC 2.1, Hgb 13, plt 186, ANC 0.9 PET 09-25-2014 in [facility] system and CT CAP at [facility] 10-25-15 with no clear pulmonary involvement, stable 2 cm lesion at dome of liver and 11 mm left hepatic lobe lesion. She had right tomo mammogram at [facility] 09-17-15, with heterogeneously dense breast tissue but no other mammographic findings of concern.  CA 2729 increased from 38 in 08-2015 to 49 in April 2017 and 65 in May 2017. CT CAP / PET 03-16-16 showed progression lung, liver and bone. Letrozole and ibrance DCd on 03-19-16, with plans to begin chemotherapy. 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 She elected whole brain RT, given by Jill Johnston ~ 03-23-16 thru 04-10-16.  She had first gemzar on 04-24-16  She had evaluation for vaginal bleeding "from polyp", follows yearly with gynecologist in [address], up to date."  Her subsequent history is as detailed below.  INTERVAL HISTORY: Jill Johnston returns today for follow-up and treatment of her metastatic estrogen receptor positive breast cancer accompanied by her husband Jill Johnston. She receives gemcitabine days 1 and 8 of each 21 day cycle, with Neupogen support on days 4 and 5 of each dose.  However her treatments were interrupted after 11/06/2016 so she could receive radiation to her cervical spine which she was having increased pain and decreased range of motion. She completed the radiation treatments 12/02/2016. She received a total of 30 gray in 10 fractions to the C-spine.  She tolerated the treatments generally well, has significantly decreased pain in the neck and moves the neck much more easily than before.  However  she is having some swallowing problems. It isn't exactly a sore throat. It's more feeling that the food gets stuck in the upper throat. His had no nausea or vomiting. She tells me  she cracked a tooth and she is scheduled to see an endodontic test next week. She also developed a scotoma in the left eye. She was evaluated by Jill. Zadie Johnston who did not notice (per patient's report) anything that would explain the visual change, and particularly no evidence of cancer in the retina. She says this scotoma problem is getting better.  Also after the last visit here she ran out of pain medication early. She had not been taking any more than prescribed. Her husband concluded that they simply had not been given enough pain medications and brought this up with the pharmacist who provided them with the additional pills.  REVIEW OF SYSTEMS: Aside from the problems just mentioned, Jill Johnston's detailed review of systems was stable. She is celebrating [employer] over [place] in considering a trip to [place] with her husband.   PAST MEDICAL HISTORY: Past Medical History:  Diagnosis Date  . Anxiety   . Breast cancer ([place])    Stage IV, ER positive left upper outer quadrant breast cancer with metastasis to bone  . Breast cancer metastasized to multiple sites ([place]) 04/12/2016   bone, liver, lung and brain  . GERD (gastroesophageal reflux disease)   . Hypertension     PAST SURGICAL HISTORY: Past Surgical History:  Procedure Laterality Date  . CHOLECYSTECTOMY    . ESOPHAGOGASTRODUODENOSCOPY (EGD) WITH PROPOFOL N/A 07/12/2015   Procedure: ESOPHAGOGASTRODUODENOSCOPY (EGD) WITH PROPOFOL;  Surgeon: Jill Houston, MD;  Location: [facility];  Service: Endoscopy;  Laterality: N/A;  1:00  . IR GENERIC HISTORICAL  04/22/2016   IR US GUIDE VASC ACCESS RIGHT 04/22/2016 Jill Daft, MD [facility]-INTER[facility] RAD  . IR GENERIC HISTORICAL  04/22/2016   IR FLUORO GUIDE PORT INSERTION RIGHT 04/22/2016 Jill Daft, MD [facility]  . MASTECTOMY  April 2009   Left breast with lymph node resection    FAMILY HISTORY Family History  Problem Relation Age of Onset  . Heart attack Mother        Died age 109  .  Heart disease Father        Died age 25  . Brain cancer Father        pt not sure if he actually had this  . Cancer Maternal Grandmother        dx. NOS cancer at older age; d. 41  . Heart attack Maternal Grandfather 79  . Colon cancer Paternal Grandmother        d. 93s  The patient's father died at age 62 she believes from heart disease. The patient's mother died at age 68 also from a myocardial infarction. The patient had one brother, no sisters. There is no history of breast or ovarian cancer in the family despite extensive G neurologic research   GYNECOLOGIC HISTORY:  No LMP recorded. Patient is not currently having periods (Reason: Chemotherapy).  menarche age 6, first live birth age 19, the patient is GX P1. She stopped having periods with chemotherapy in 2009 and these have not resumed.   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 They recently moved to [place] from [place]. Their son [name] is 56 years old as of November 2017 . The patient is  [place] Orthodox      ADVANCED DIRECTIVES:  the patient's husband is her healthcare power of attorney. There is no living will in place    HEALTH MAINTENANCE: Social History  Substance Use Topics  . Smoking status: Never Smoker  . Smokeless tobacco: Never Used  . Alcohol use No     Colonoscopy:  PAP:  Bone density:   No Known Allergies  Current Outpatient Prescriptions  Medication Sig Dispense Refill  . docusate sodium (COLACE) 100 MG capsule Take 100 mg by mouth daily.     . hyaluronate sodium (RADIAPLEXRX) GEL Apply 1 application topically 2 (two) times daily.    Marland Kitchen HYDROcodone-acetaminophen (HYCET) 7.5-325 mg/15 ml solution Take 15 mLs by mouth 4 (four) times daily as needed for moderate pain. 120 mL 0  . ibuprofen (ADVIL,MOTRIN) 200 MG tablet Take 400 mg by mouth daily as needed.    . lidocaine-prilocaine (EMLA) cream Apply  1 application topically as needed. To port 1 hour before going to be accessed with needle. Cover with plastic wrap. 30 g 1  . lisinopril (PRINIVIL,ZESTRIL) 10 MG tablet Take 1 tablet (10 mg total) by mouth daily. (Patient not taking: Reported on 10/26/2016) 30 tablet 0  . oxyCODONE (OXYCONTIN) 30 MG 12 hr tablet Take 30 mg by mouth 2 (two) times daily. 60 each 0  . Oxycodone HCl 10 MG TABS Take 1 tablet (10 mg total) by mouth every 3 (three) hours as needed. 60 tablet 0  . pantoprazole (PROTONIX) 40 MG tablet TAKE 1 TABLET(40 MG) BY MOUTH TWICE DAILY BEFORE A MEAL 60 tablet 5  . polyethylene glycol (MIRALAX / GLYCOLAX) packet Take 17 grams by mouth every day. 14 each 6   No current facility-administered medications for this visit.     OBJECTIVE: Middle-aged white woman In no acute distress  Vitals:   12/11/16 1214  BP: (!) 121/94  Pulse: 89  Resp: 20  Temp: 98.2 F (36.8 C)     Body mass index is 22.1 kg/m.    ECOG FS:1 - Symptomatic but completely ambulatory Filed Weights   12/11/16 1214  Weight: 132 lb 12.8 oz (60.2 kg)   Sclerae unicteric, EOMs intact, Pupils round and equal Oropharynx clear and moist No cervical or supraclavicular adenopathy; no lung or guarding Lungs no rales or rhonchi Heart regular rate and rhythm Abd soft, nontender, positive bowel sounds MSK no focal spinal tenderness, no upper extremity lymphedema Neuro: nonfocal, well oriented, positive affect Breasts: Deferred   LAB RESULTS:  CMP     Component Value Date/Time   NA 140 12/11/2016 1156   K 4.1 12/11/2016 1156   CL 105 05/20/2016 0930   CO2 29 12/11/2016 1156   GLUCOSE 95 12/11/2016 1156   BUN 11.4 12/11/2016 1156   CREATININE 0.8 12/11/2016 1156   CALCIUM 9.3 12/11/2016 1156   PROT 7.1 12/11/2016 1156   ALBUMIN 3.2 (L) 12/11/2016 1156   AST 82 (H) 12/11/2016 1156   ALT 37 12/11/2016 1156   ALKPHOS 174 (H) 12/11/2016 1156   BILITOT 0.57 12/11/2016 1156   GFRNONAA >60 05/20/2016 0930    GFRAA >60 05/20/2016 0930    INo results found for: SPEP, UPEP  Lab Results  Component Value Date   WBC 3.2 (L) 12/11/2016   NEUTROABS 2.0 12/11/2016   HGB 13.5 12/11/2016   HCT 42.1 12/11/2016   MCV 96.6 12/11/2016   PLT 173 12/11/2016      Chemistry      Component Value Date/Time  NA 140 12/11/2016 1156   K 4.1 12/11/2016 1156   CL 105 05/20/2016 0930   CO2 29 12/11/2016 1156   BUN 11.4 12/11/2016 1156   CREATININE 0.8 12/11/2016 1156      Component Value Date/Time   CALCIUM 9.3 12/11/2016 1156   ALKPHOS 174 (H) 12/11/2016 1156   AST 82 (H) 12/11/2016 1156   ALT 37 12/11/2016 1156   BILITOT 0.57 12/11/2016 1156       Lab Results  Component Value Date   LABCA2 98 (H) 02/27/2016    No components found for: [id number]  No results for input(s): INR in the last 168 hours.  Urinalysis    Component Value Date/Time   COLORURINE ORANGE (A) 05/17/2016 0852   APPEARANCEUR CLOUDY (A) 05/17/2016 0852   LABSPEC 1.027 05/17/2016 0852   PHURINE 6.0 05/17/2016 0852   GLUCOSEU NEGATIVE 05/17/2016 0852   HGBUR NEGATIVE 05/17/2016 0852   BILIRUBINUR SMALL (A) 05/17/2016 0852   KETONESUR NEGATIVE 05/17/2016 0852   PROTEINUR 30 (A) 05/17/2016 0852   NITRITE NEGATIVE 05/17/2016 0852   LEUKOCYTESUR TRACE (A) 05/17/2016 0852     STUDIES: No results found.  ELIGIBLE FOR AVAILABLE RESEARCH PROTOCOL: no  ASSESSMENT: 51 y.o. BRCA negative [place] woman originally from [place]  (1) status post left breast upper inner quadrant biopsy 05/17/2007 for a clinical mT2 N2, stage IIIA invasive ductal breast cancer, strongly estrogen receptor positive, weakly progesterone receptor positive, HER-2 negative, with an MIB-1 of 52%  (2) neoadjuvant chemotherapy consisted of dose dense doxorubicin and cyclophosphamide 4 followed by weekly paclitaxel 12, started 06/10/2007, completed 09/28/2007  (3) status post left modified radical mastectomy 10/27/2007 at [facility] [phone]) removed  that invasive ductal carcinoma, grade 2, pT2, pN1a (downstaged to IIB), estrogen receptor strongly positive, progesterone receptor weakly positive, HER-2 not amplified by immunohistochemistry or FISH with a signals ratio of 0.94, number per cell 3.4  (4) status post adjuvant radiation to the left chest wall as well as the left supraclavicular and axillary nodal regions (50.4 gray +10 Gray boost) given between 12/15/2007 on 01/31/2008  (5) on adjuvant tamoxifen 02/20/2008 through March 2016  METASTATIC DISEASE: March 2016, presenting with bone pain (6) staging studies April 2016 showed diffuse bony metastatic disease, intrathoracic adenopathy, and small lung lesions.  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, estrogen receptor strongly positive, progesterone receptor and HER-2 negative  (b) CA-27-29 is informative  (7) denosumab/Xgeva started April 2016,  (a) zolendronate started   (8) letrozole/palbociclib started April 2016,  discontinued September 2017 with progression  (a) restaging studies September 2017 document bone, lung, and liver involvement  (b) brain MRI September 2017 c/w leptomeningeal spread, +/- parietal metastases  (9) whole brain irradiation 03/30/16 - 04/10/16 Site/dose:   The whole brain was treated to 30 Gy in 10 fractions of 3 Gy.  (a) brain MRI 07/16/2016 interpreted as requiring only further follow-up  (b) brain MRI 10/22/2016 read as stable  (10) repeat genetic testing 04/23/2016 through the [facility] Hereditary Cancer Panel offered by [facility] found no deleterious mutations in APC, ATM, BARD1, BMPR1A, BRCA1, BRCA2, BRIP1, CHD1, CDK4, CDKN2A, CHEK2, EPCAM (large rearrangement only), GREM1/SCG5, MLH1, MSH2, MSH6, MUTYH, NBN, PALB2, PMS2, POLD1, POLE, PTEN, RAD51C, RAD51D, SMAD4, STK11, and TP53  (11) gemcitabine started 04/24/2016,  given days 1 and 8 of each 21 day cycle  (a) day 8 cycle 1 delayed because of  cytopenias--dose reduced and neupogen added; refuses neulasta/onpro  (b) CT scan of the chest 07/16/2016 shows  decrease in the size of liver metastases.  (c) chest CT 10/15/2016 shows liver metastases to be stable, no lung metastases; new sternal mets?  (12) Radiation treatment dates:   11/18/16 - 12/02/16 Site/dose:   c-spine treated to 30 Gy with 10 fractions of 3 Gy  PLAN: Haynes Dage a clearly benefited from the radiation to the neck. She has recovered sufficiently well that I think a week from now she will be able to resume her gemcitabine treatments.  Since I just saw her today with an exam I will not see her with that first dose but I will see her again on June 29 with her day 8 treatment. Of course she will receive Neulasta on days 4 and 5 of each dose of Gemzar. Otherwise her counts would not allow [place] to treat her.  Her cycle after the coming 1 will be July 13 and July 20. I will see her in July 13 and since I will be out of town July 20 I will see her July 19 2 review results of her brain MRI and CT scan of the chest with particular attention to the liver that will be performed 2 days before.  They understand that both the Xgeva she received previously and his alendronate she is receiving now can cause osteonecrosis of the jaw and that the risk of that rises if teeth are pulled. She will make sure her dentist is aware of the drugs she is receiving.  They also wanted to know if it was okay for her to go to [place]. I don't see why that would be a problem. Even if she had a crisis there are a would be the same as here and she is really wanting to go.  They know to call for any other problems that may develop before her next visit.   Jill Cruel, MD   12/12/2016 2:28 PM Medical Oncology and Hematology [facility] [address] [place], [place] [address] Tel. [phone]    Fax. [phone]

## 2016-12-11 ENCOUNTER — Encounter: Payer: Self-pay | Admitting: Urology

## 2016-12-11 ENCOUNTER — Other Ambulatory Visit (HOSPITAL_BASED_OUTPATIENT_CLINIC_OR_DEPARTMENT_OTHER): Payer: BLUE CROSS/BLUE SHIELD

## 2016-12-11 ENCOUNTER — Ambulatory Visit (HOSPITAL_BASED_OUTPATIENT_CLINIC_OR_DEPARTMENT_OTHER): Payer: BLUE CROSS/BLUE SHIELD | Admitting: Oncology

## 2016-12-11 ENCOUNTER — Encounter (HOSPITAL_COMMUNITY): Payer: Self-pay

## 2016-12-11 VITALS — BP 121/94 | HR 89 | Temp 98.2°F | Resp 20 | Ht 65.0 in | Wt 132.8 lb

## 2016-12-11 DIAGNOSIS — C7951 Secondary malignant neoplasm of bone: Secondary | ICD-10-CM | POA: Diagnosis not present

## 2016-12-11 DIAGNOSIS — Z170 Estrogen receptor positive status [ER+]: Secondary | ICD-10-CM | POA: Diagnosis not present

## 2016-12-11 DIAGNOSIS — C50912 Malignant neoplasm of unspecified site of left female breast: Secondary | ICD-10-CM

## 2016-12-11 DIAGNOSIS — C7949 Secondary malignant neoplasm of other parts of nervous system: Secondary | ICD-10-CM

## 2016-12-11 DIAGNOSIS — C787 Secondary malignant neoplasm of liver and intrahepatic bile duct: Secondary | ICD-10-CM

## 2016-12-11 DIAGNOSIS — C7802 Secondary malignant neoplasm of left lung: Secondary | ICD-10-CM

## 2016-12-11 DIAGNOSIS — C50922 Malignant neoplasm of unspecified site of left male breast: Secondary | ICD-10-CM

## 2016-12-11 DIAGNOSIS — C50212 Malignant neoplasm of upper-inner quadrant of left female breast: Secondary | ICD-10-CM

## 2016-12-11 DIAGNOSIS — C50919 Malignant neoplasm of unspecified site of unspecified female breast: Secondary | ICD-10-CM

## 2016-12-11 LAB — COMPREHENSIVE METABOLIC PANEL
ALT: 37 U/L (ref 0–55)
AST: 82 U/L — AB (ref 5–34)
Albumin: 3.2 g/dL — ABNORMAL LOW (ref 3.5–5.0)
Alkaline Phosphatase: 174 U/L — ABNORMAL HIGH (ref 40–150)
Anion Gap: 8 mEq/L (ref 3–11)
BUN: 11.4 mg/dL (ref 7.0–26.0)
CHLORIDE: 103 meq/L (ref 98–109)
CO2: 29 mEq/L (ref 22–29)
Calcium: 9.3 mg/dL (ref 8.4–10.4)
Creatinine: 0.8 mg/dL (ref 0.6–1.1)
EGFR: 83 mL/min/{1.73_m2} — AB (ref >90)
GLUCOSE: 95 mg/dL (ref 70–140)
POTASSIUM: 4.1 meq/L (ref 3.5–5.1)
SODIUM: 140 meq/L (ref 136–145)
Total Bilirubin: 0.57 mg/dL (ref 0.20–1.20)
Total Protein: 7.1 g/dL (ref 6.4–8.3)

## 2016-12-11 LAB — CBC WITH DIFFERENTIAL/PLATELET
BASO%: 0.6 % (ref 0.0–2.0)
BASOS ABS: 0 10*3/uL (ref 0.0–0.1)
EOS%: 5.9 % (ref 0.0–7.0)
Eosinophils Absolute: 0.2 10*3/uL (ref 0.0–0.5)
HCT: 42.1 % (ref 34.8–46.6)
HGB: 13.5 g/dL (ref 11.6–15.9)
LYMPH%: 20.1 % (ref 14.0–49.7)
MCH: 31.1 pg (ref 25.1–34.0)
MCHC: 32.1 g/dL (ref 31.5–36.0)
MCV: 96.6 fL (ref 79.5–101.0)
MONO#: 0.4 10*3/uL (ref 0.1–0.9)
MONO%: 13.1 % (ref 0.0–14.0)
NEUT#: 2 10*3/uL (ref 1.5–6.5)
NEUT%: 60.3 % (ref 38.4–76.8)
Platelets: 173 10*3/uL (ref 145–400)
RBC: 4.36 10*6/uL (ref 3.70–5.45)
RDW: 16.8 % — AB (ref 11.2–14.5)
WBC: 3.2 10*3/uL — ABNORMAL LOW (ref 3.9–10.3)
lymph#: 0.7 10*3/uL — ABNORMAL LOW (ref 0.9–3.3)

## 2016-12-11 NOTE — Progress Notes (Signed)
I received a phone call form Dr. Radene Ou this morning regarding this mutual patient.  Dr. 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  Her vision is gradually improving, so he will continue to monitor closely without intervention at this time.

## 2016-12-12 LAB — CANCER ANTIGEN 27.29: CAN 27.29: 96.7 U/mL — AB (ref 0.0–38.6)

## 2016-12-14 ENCOUNTER — Other Ambulatory Visit: Payer: Self-pay | Admitting: Radiation Therapy

## 2016-12-14 DIAGNOSIS — C7931 Secondary malignant neoplasm of brain: Secondary | ICD-10-CM

## 2016-12-14 DIAGNOSIS — C7949 Secondary malignant neoplasm of other parts of nervous system: Principal | ICD-10-CM

## 2016-12-18 ENCOUNTER — Other Ambulatory Visit (HOSPITAL_BASED_OUTPATIENT_CLINIC_OR_DEPARTMENT_OTHER): Payer: BLUE CROSS/BLUE SHIELD

## 2016-12-18 ENCOUNTER — Ambulatory Visit (HOSPITAL_BASED_OUTPATIENT_CLINIC_OR_DEPARTMENT_OTHER): Payer: BLUE CROSS/BLUE SHIELD

## 2016-12-18 ENCOUNTER — Ambulatory Visit: Payer: BLUE CROSS/BLUE SHIELD

## 2016-12-18 VITALS — BP 120/61 | HR 79 | Temp 98.0°F | Resp 17

## 2016-12-18 DIAGNOSIS — C7802 Secondary malignant neoplasm of left lung: Secondary | ICD-10-CM

## 2016-12-18 DIAGNOSIS — C7951 Secondary malignant neoplasm of bone: Secondary | ICD-10-CM

## 2016-12-18 DIAGNOSIS — C50212 Malignant neoplasm of upper-inner quadrant of left female breast: Secondary | ICD-10-CM | POA: Diagnosis not present

## 2016-12-18 DIAGNOSIS — C787 Secondary malignant neoplasm of liver and intrahepatic bile duct: Secondary | ICD-10-CM

## 2016-12-18 DIAGNOSIS — C50922 Malignant neoplasm of unspecified site of left male breast: Secondary | ICD-10-CM

## 2016-12-18 DIAGNOSIS — Z95828 Presence of other vascular implants and grafts: Secondary | ICD-10-CM

## 2016-12-18 DIAGNOSIS — C50912 Malignant neoplasm of unspecified site of left female breast: Secondary | ICD-10-CM

## 2016-12-18 DIAGNOSIS — C7949 Secondary malignant neoplasm of other parts of nervous system: Secondary | ICD-10-CM | POA: Diagnosis not present

## 2016-12-18 DIAGNOSIS — Z5111 Encounter for antineoplastic chemotherapy: Secondary | ICD-10-CM

## 2016-12-18 DIAGNOSIS — C50919 Malignant neoplasm of unspecified site of unspecified female breast: Secondary | ICD-10-CM

## 2016-12-18 LAB — COMPREHENSIVE METABOLIC PANEL
ALT: 43 U/L (ref 0–55)
AST: 96 U/L — AB (ref 5–34)
Albumin: 2.9 g/dL — ABNORMAL LOW (ref 3.5–5.0)
Alkaline Phosphatase: 163 U/L — ABNORMAL HIGH (ref 40–150)
Anion Gap: 10 mEq/L (ref 3–11)
BILIRUBIN TOTAL: 0.64 mg/dL (ref 0.20–1.20)
BUN: 14.1 mg/dL (ref 7.0–26.0)
CHLORIDE: 104 meq/L (ref 98–109)
CO2: 24 meq/L (ref 22–29)
CREATININE: 0.8 mg/dL (ref 0.6–1.1)
Calcium: 9.3 mg/dL (ref 8.4–10.4)
EGFR: 89 mL/min/{1.73_m2} — ABNORMAL LOW (ref >90)
GLUCOSE: 103 mg/dL (ref 70–140)
Potassium: 4.2 mEq/L (ref 3.5–5.1)
SODIUM: 138 meq/L (ref 136–145)
TOTAL PROTEIN: 6.5 g/dL (ref 6.4–8.3)

## 2016-12-18 LAB — CBC WITH DIFFERENTIAL/PLATELET
BASO%: 0.5 % (ref 0.0–2.0)
Basophils Absolute: 0 10*3/uL (ref 0.0–0.1)
EOS%: 3.1 % (ref 0.0–7.0)
Eosinophils Absolute: 0.1 10*3/uL (ref 0.0–0.5)
HCT: 39.5 % (ref 34.8–46.6)
HGB: 12.3 g/dL (ref 11.6–15.9)
LYMPH%: 22.6 % (ref 14.0–49.7)
MCH: 30.6 pg (ref 25.1–34.0)
MCHC: 31.1 g/dL — AB (ref 31.5–36.0)
MCV: 98.3 fL (ref 79.5–101.0)
MONO#: 0.5 10*3/uL (ref 0.1–0.9)
MONO%: 10.6 % (ref 0.0–14.0)
NEUT%: 63.2 % (ref 38.4–76.8)
NEUTROS ABS: 2.7 10*3/uL (ref 1.5–6.5)
Platelets: 143 10*3/uL — ABNORMAL LOW (ref 145–400)
RBC: 4.02 10*6/uL (ref 3.70–5.45)
RDW: 15.5 % — ABNORMAL HIGH (ref 11.2–14.5)
WBC: 4.3 10*3/uL (ref 3.9–10.3)
lymph#: 1 10*3/uL (ref 0.9–3.3)

## 2016-12-18 MED ORDER — HEPARIN SOD (PORK) LOCK FLUSH 100 UNIT/ML IV SOLN
500.0000 [IU] | Freq: Once | INTRAVENOUS | Status: AC | PRN
  Administered 2016-12-18: 500 [IU]
  Filled 2016-12-18: qty 5

## 2016-12-18 MED ORDER — ONDANSETRON HCL 4 MG/2ML IJ SOLN
INTRAMUSCULAR | Status: AC
  Filled 2016-12-18: qty 4

## 2016-12-18 MED ORDER — SODIUM CHLORIDE 0.9% FLUSH
10.0000 mL | INTRAVENOUS | Status: DC | PRN
  Administered 2016-12-18: 10 mL via INTRAVENOUS
  Filled 2016-12-18: qty 10

## 2016-12-18 MED ORDER — SODIUM CHLORIDE 0.9 % IV SOLN
Freq: Once | INTRAVENOUS | Status: AC
  Administered 2016-12-18: 14:00:00 via INTRAVENOUS

## 2016-12-18 MED ORDER — SODIUM CHLORIDE 0.9% FLUSH
10.0000 mL | INTRAVENOUS | Status: DC | PRN
  Administered 2016-12-18: 10 mL
  Filled 2016-12-18: qty 10

## 2016-12-18 MED ORDER — SODIUM CHLORIDE 0.9 % IV SOLN
600.0000 mg/m2 | Freq: Once | INTRAVENOUS | Status: AC
  Administered 2016-12-18: 1064 mg via INTRAVENOUS
  Filled 2016-12-18: qty 27.98

## 2016-12-18 MED ORDER — ONDANSETRON HCL 4 MG/2ML IJ SOLN
8.0000 mg | Freq: Once | INTRAMUSCULAR | Status: AC
  Administered 2016-12-18: 8 mg via INTRAVENOUS

## 2016-12-18 NOTE — Patient Instructions (Signed)
[facility] Discharge Instructions for Patients Receiving Chemotherapy  Today you received the following chemotherapy agents Gemzar.  To help prevent nausea and vomiting after your treatment, we encourage you to take your nausea medication.   If you develop nausea and vomiting that is not controlled by your nausea medication, call the clinic.   BELOW ARE SYMPTOMS THAT SHOULD BE REPORTED IMMEDIATELY:  *FEVER GREATER THAN 100.5 F  *CHILLS WITH OR WITHOUT FEVER  NAUSEA AND VOMITING THAT IS NOT CONTROLLED WITH YOUR NAUSEA MEDICATION  *UNUSUAL SHORTNESS OF BREATH  *UNUSUAL BRUISING OR BLEEDING  TENDERNESS IN MOUTH AND THROAT WITH OR WITHOUT PRESENCE OF ULCERS  *URINARY PROBLEMS  *BOWEL PROBLEMS  UNUSUAL RASH Items with * indicate a potential emergency and should be followed up as soon as possible.  Feel free to call the clinic you have any questions or concerns. The clinic phone number is [phone].  Please show the CHEMO ALERT CARD at check-in to the Emergency Department and triage nurse.   

## 2016-12-18 NOTE — Patient Instructions (Signed)

## 2016-12-18 NOTE — Progress Notes (Signed)
Okay to treat with AST  Of 96 per Dr. Alen Blew.

## 2016-12-21 ENCOUNTER — Ambulatory Visit (HOSPITAL_BASED_OUTPATIENT_CLINIC_OR_DEPARTMENT_OTHER): Payer: BLUE CROSS/BLUE SHIELD

## 2016-12-21 VITALS — BP 111/79 | HR 97 | Temp 97.6°F | Resp 18

## 2016-12-21 DIAGNOSIS — C50912 Malignant neoplasm of unspecified site of left female breast: Secondary | ICD-10-CM | POA: Diagnosis not present

## 2016-12-21 DIAGNOSIS — C7949 Secondary malignant neoplasm of other parts of nervous system: Secondary | ICD-10-CM

## 2016-12-21 DIAGNOSIS — C787 Secondary malignant neoplasm of liver and intrahepatic bile duct: Secondary | ICD-10-CM

## 2016-12-21 DIAGNOSIS — C7951 Secondary malignant neoplasm of bone: Secondary | ICD-10-CM

## 2016-12-21 DIAGNOSIS — Z5189 Encounter for other specified aftercare: Secondary | ICD-10-CM

## 2016-12-21 DIAGNOSIS — C7802 Secondary malignant neoplasm of left lung: Secondary | ICD-10-CM | POA: Diagnosis not present

## 2016-12-21 MED ORDER — TBO-FILGRASTIM 300 MCG/0.5ML ~~LOC~~ SOSY
300.0000 ug | PREFILLED_SYRINGE | Freq: Once | SUBCUTANEOUS | Status: AC
  Administered 2016-12-21: 300 ug via SUBCUTANEOUS
  Filled 2016-12-21: qty 0.5

## 2016-12-22 ENCOUNTER — Ambulatory Visit (HOSPITAL_BASED_OUTPATIENT_CLINIC_OR_DEPARTMENT_OTHER): Payer: BLUE CROSS/BLUE SHIELD

## 2016-12-22 VITALS — BP 113/78 | HR 108 | Temp 97.3°F | Resp 20

## 2016-12-22 DIAGNOSIS — Z5189 Encounter for other specified aftercare: Secondary | ICD-10-CM

## 2016-12-22 DIAGNOSIS — C787 Secondary malignant neoplasm of liver and intrahepatic bile duct: Secondary | ICD-10-CM | POA: Diagnosis not present

## 2016-12-22 DIAGNOSIS — C7802 Secondary malignant neoplasm of left lung: Secondary | ICD-10-CM

## 2016-12-22 DIAGNOSIS — C50912 Malignant neoplasm of unspecified site of left female breast: Secondary | ICD-10-CM

## 2016-12-22 DIAGNOSIS — C7951 Secondary malignant neoplasm of bone: Secondary | ICD-10-CM

## 2016-12-22 DIAGNOSIS — C50212 Malignant neoplasm of upper-inner quadrant of left female breast: Secondary | ICD-10-CM | POA: Diagnosis not present

## 2016-12-22 DIAGNOSIS — C7949 Secondary malignant neoplasm of other parts of nervous system: Secondary | ICD-10-CM | POA: Diagnosis not present

## 2016-12-22 MED ORDER — TBO-FILGRASTIM 300 MCG/0.5ML ~~LOC~~ SOSY
300.0000 ug | PREFILLED_SYRINGE | Freq: Once | SUBCUTANEOUS | Status: AC
  Administered 2016-12-22: 300 ug via SUBCUTANEOUS
  Filled 2016-12-22: qty 0.5

## 2016-12-22 NOTE — Patient Instructions (Signed)

## 2016-12-23 ENCOUNTER — Other Ambulatory Visit: Payer: Self-pay | Admitting: Oncology

## 2016-12-25 ENCOUNTER — Ambulatory Visit (HOSPITAL_BASED_OUTPATIENT_CLINIC_OR_DEPARTMENT_OTHER): Payer: BLUE CROSS/BLUE SHIELD | Admitting: Oncology

## 2016-12-25 ENCOUNTER — Telehealth: Payer: Self-pay | Admitting: *Deleted

## 2016-12-25 ENCOUNTER — Ambulatory Visit: Payer: BLUE CROSS/BLUE SHIELD

## 2016-12-25 ENCOUNTER — Ambulatory Visit (HOSPITAL_BASED_OUTPATIENT_CLINIC_OR_DEPARTMENT_OTHER): Payer: BLUE CROSS/BLUE SHIELD

## 2016-12-25 ENCOUNTER — Other Ambulatory Visit (HOSPITAL_BASED_OUTPATIENT_CLINIC_OR_DEPARTMENT_OTHER): Payer: BLUE CROSS/BLUE SHIELD

## 2016-12-25 DIAGNOSIS — Z5111 Encounter for antineoplastic chemotherapy: Secondary | ICD-10-CM

## 2016-12-25 DIAGNOSIS — C7951 Secondary malignant neoplasm of bone: Secondary | ICD-10-CM

## 2016-12-25 DIAGNOSIS — Z170 Estrogen receptor positive status [ER+]: Secondary | ICD-10-CM

## 2016-12-25 DIAGNOSIS — C7802 Secondary malignant neoplasm of left lung: Secondary | ICD-10-CM

## 2016-12-25 DIAGNOSIS — C787 Secondary malignant neoplasm of liver and intrahepatic bile duct: Secondary | ICD-10-CM | POA: Diagnosis not present

## 2016-12-25 DIAGNOSIS — C7949 Secondary malignant neoplasm of other parts of nervous system: Secondary | ICD-10-CM

## 2016-12-25 DIAGNOSIS — C50912 Malignant neoplasm of unspecified site of left female breast: Secondary | ICD-10-CM

## 2016-12-25 DIAGNOSIS — Z95828 Presence of other vascular implants and grafts: Secondary | ICD-10-CM

## 2016-12-25 DIAGNOSIS — C50922 Malignant neoplasm of unspecified site of left male breast: Secondary | ICD-10-CM

## 2016-12-25 DIAGNOSIS — C50919 Malignant neoplasm of unspecified site of unspecified female breast: Secondary | ICD-10-CM

## 2016-12-25 DIAGNOSIS — C50412 Malignant neoplasm of upper-outer quadrant of left female breast: Secondary | ICD-10-CM

## 2016-12-25 LAB — COMPREHENSIVE METABOLIC PANEL
ALT: 53 U/L (ref 0–55)
AST: 107 U/L — ABNORMAL HIGH (ref 5–34)
Albumin: 2.4 g/dL — ABNORMAL LOW (ref 3.5–5.0)
Alkaline Phosphatase: 177 U/L — ABNORMAL HIGH (ref 40–150)
Anion Gap: 6 mEq/L (ref 3–11)
BUN: 8.5 mg/dL (ref 7.0–26.0)
CO2: 29 meq/L (ref 22–29)
Calcium: 9 mg/dL (ref 8.4–10.4)
Chloride: 105 mEq/L (ref 98–109)
Creatinine: 0.6 mg/dL (ref 0.6–1.1)
GLUCOSE: 92 mg/dL (ref 70–140)
POTASSIUM: 4.2 meq/L (ref 3.5–5.1)
SODIUM: 140 meq/L (ref 136–145)
TOTAL PROTEIN: 6 g/dL — AB (ref 6.4–8.3)
Total Bilirubin: 0.47 mg/dL (ref 0.20–1.20)

## 2016-12-25 LAB — CBC WITH DIFFERENTIAL/PLATELET
BASO%: 0.8 % (ref 0.0–2.0)
BASOS ABS: 0 10*3/uL (ref 0.0–0.1)
EOS%: 1.9 % (ref 0.0–7.0)
Eosinophils Absolute: 0.1 10*3/uL (ref 0.0–0.5)
HCT: 35.8 % (ref 34.8–46.6)
HGB: 11.2 g/dL — ABNORMAL LOW (ref 11.6–15.9)
LYMPH%: 22.1 % (ref 14.0–49.7)
MCH: 30.4 pg (ref 25.1–34.0)
MCHC: 31.3 g/dL — ABNORMAL LOW (ref 31.5–36.0)
MCV: 97.3 fL (ref 79.5–101.0)
MONO#: 0.7 10*3/uL (ref 0.1–0.9)
MONO%: 19.9 % — AB (ref 0.0–14.0)
NEUT#: 2 10*3/uL (ref 1.5–6.5)
NEUT%: 55.3 % (ref 38.4–76.8)
NRBC: 1 % — AB (ref 0–0)
Platelets: 74 10*3/uL — ABNORMAL LOW (ref 145–400)
RBC: 3.68 10*6/uL — AB (ref 3.70–5.45)
RDW: 15.4 % — ABNORMAL HIGH (ref 11.2–14.5)
WBC: 3.7 10*3/uL — ABNORMAL LOW (ref 3.9–10.3)
lymph#: 0.8 10*3/uL — ABNORMAL LOW (ref 0.9–3.3)

## 2016-12-25 MED ORDER — SODIUM CHLORIDE 0.9% FLUSH
10.0000 mL | INTRAVENOUS | Status: DC | PRN
  Filled 2016-12-25: qty 10

## 2016-12-25 MED ORDER — OXYCODONE HCL 10 MG PO TABS: 10.0000 mg | ORAL_TABLET | ORAL | 0 refills | Status: DC | PRN

## 2016-12-25 MED ORDER — SODIUM CHLORIDE 0.9 % IV SOLN
Freq: Once | INTRAVENOUS | Status: AC
  Administered 2016-12-25: 14:00:00 via INTRAVENOUS

## 2016-12-25 MED ORDER — ONDANSETRON HCL 4 MG/2ML IJ SOLN
8.0000 mg | Freq: Once | INTRAMUSCULAR | Status: AC
  Administered 2016-12-25: 8 mg via INTRAVENOUS

## 2016-12-25 MED ORDER — HEPARIN SOD (PORK) LOCK FLUSH 100 UNIT/ML IV SOLN
500.0000 [IU] | Freq: Once | INTRAVENOUS | Status: AC | PRN
  Administered 2016-12-25: 500 [IU]
  Filled 2016-12-25: qty 5

## 2016-12-25 MED ORDER — SODIUM CHLORIDE 0.9% FLUSH
10.0000 mL | INTRAVENOUS | Status: DC | PRN
  Administered 2016-12-25: 10 mL
  Filled 2016-12-25: qty 10

## 2016-12-25 MED ORDER — ONDANSETRON HCL 4 MG/2ML IJ SOLN
INTRAMUSCULAR | Status: AC
  Filled 2016-12-25: qty 4

## 2016-12-25 MED ORDER — OXYCODONE HCL ER 30 MG PO T12A: 30.0000 mg | EXTENDED_RELEASE_TABLET | Freq: Two times a day (BID) | ORAL | 0 refills | Status: DC

## 2016-12-25 MED ORDER — GEMCITABINE HCL CHEMO INJECTION 1 GM/26.3ML
600.0000 mg/m2 | Freq: Once | INTRAVENOUS | Status: AC
  Administered 2016-12-25: 1064 mg via INTRAVENOUS
  Filled 2016-12-25: qty 27.98

## 2016-12-25 NOTE — Progress Notes (Signed)
Done on breast side by desk RN

## 2016-12-25 NOTE — Patient Instructions (Signed)
[facility] Discharge Instructions for Patients Receiving Chemotherapy  Today you received the following chemotherapy agents Gemzar.  To help prevent nausea and vomiting after your treatment, we encourage you to take your nausea medication.   If you develop nausea and vomiting that is not controlled by your nausea medication, call the clinic.   BELOW ARE SYMPTOMS THAT SHOULD BE REPORTED IMMEDIATELY:  *FEVER GREATER THAN 100.5 F  *CHILLS WITH OR WITHOUT FEVER  NAUSEA AND VOMITING THAT IS NOT CONTROLLED WITH YOUR NAUSEA MEDICATION  *UNUSUAL SHORTNESS OF BREATH  *UNUSUAL BRUISING OR BLEEDING  TENDERNESS IN MOUTH AND THROAT WITH OR WITHOUT PRESENCE OF ULCERS  *URINARY PROBLEMS  *BOWEL PROBLEMS  UNUSUAL RASH Items with * indicate a potential emergency and should be followed up as soon as possible.  Feel free to call the clinic you have any questions or concerns. The clinic phone number is [phone].  Please show the CHEMO ALERT CARD at check-in to the Emergency Department and triage nurse.   

## 2016-12-25 NOTE — Progress Notes (Signed)
[facility]  [phone] Fax:([phone]) [phone]     ID: Jill Johnston DOB: Jan 05, 1966  MR#: [id number]  SNK#:[id number]  Patient Care Team: [name], MD as PCP - General (Oncology) [name], MD as Consulting Physician (Radiation Oncology) Muss, Demaris Callander as Consulting Physician (Internal Medicine) [name], MD as Consulting Physician (Internal Medicine) Clydell Hakim, MD as Consulting Physician (Anesthesiology) [name], MD OTHER MD:  CHIEF COMPLAINT: Estrogen receptor positive metastatic breast cancer  CURRENT TREATMENT: Gemcitabine, zolendronate   BREAST CANCER HISTORY: From Dr. 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 The breast cancer was ER + 100%, PR 1%, HER 2 IHC/FISH negative, Ki67 52%, BRCA 1/2 negative, initial CA 2729 WNL. She had neoadjuvant AC x4 taxol x12 dose dense from 06-10-2007 thru 09-28-2007 by Dr B.Darovsky. 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 She had radiation to chest wall, supraclavicular region and left axilla 50.4 Gy with boost to mastectomy scar to 60.4 Gy, by Dr [name] from 12-15-2007 thru 01-31-2008. She was on tamoxifen from 02-20-2008 thru 09-25-2014. She developed pain in ribs 08-2014, with CA 2729 52 and PET CT and MRI with diffuse bone metastatic disease, no cord compression, small lung lesions and intrathoracic adenopathy. 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 She began Denosumab 120 mcg monthly starting 4-7-201, and letrozole + palbociclib beginning4-12-2014. She was seen in consultation by Dr [name] [phone]. Palbociclib dose decreased to 100 mg daily x 21 q  28 days due to neutropenia. CBC on 10-25-15: WBC 2.1, Hgb 13, plt 186, ANC 0.9 PET 09-25-2014 in [facility] system and CT CAP at [place] 10-25-15 with no clear pulmonary involvement, stable 2 cm lesion at dome of liver and 11 mm left hepatic lobe lesion. She had right tomo mammogram at [facility] 09-17-15, with heterogeneously dense breast tissue but no other mammographic findings of concern.  CA 2729 increased from 38 in 08-2015 to 49 in April 2017 and 65 in May 2017. CT CAP / PET 03-16-16 showed progression lung, liver and bone. Letrozole and ibrance DCd on 03-19-16, with plans to begin chemotherapy. 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 She elected whole brain RT, given by Dr [name] ~ 03-23-16 thru 04-10-16.  She had first gemzar on 04-24-16  She had evaluation for vaginal bleeding "from polyp", follows yearly with gynecologist in [place], up to date."  Her subsequent history is as detailed below.  INTERVAL HISTORY: Jill Johnston returns today for follow-up of her estrogen receptor positive breast cancer accompanied by her husband Jill Johnston. Today is day 1 cycle 11 of gemcitabine which she receives days 1 and 8 of each 21 day cycle, with Neupogen support.  REVIEW OF SYSTEMS: She is following the world cup and  [place] done very closely. She takes a nap in the afternoon and then is more active in the evening. She is not eating as well, and has lost a little bit of weight. Her diet is very good as far as it goes. She eats a lot of vegetables and little meat, no junk food. She denies unusual headaches, visual changes, nausea or  vomiting. They have been no intercurrent fevers. They are planning to spend the weekend in [place]. A detailed review of systems today was otherwise stable   PAST MEDICAL HISTORY: Past Medical History:  Diagnosis Date  . Anxiety   . Breast cancer ([place])    Stage  IV, ER positive left upper outer quadrant breast cancer with metastasis to bone  . Breast cancer metastasized to multiple sites ([place]) 04/12/2016   bone, liver, lung and brain  . GERD (gastroesophageal reflux disease)   . Hypertension     PAST SURGICAL HISTORY: Past Surgical History:  Procedure Laterality Date  . CHOLECYSTECTOMY    . ESOPHAGOGASTRODUODENOSCOPY (EGD) WITH PROPOFOL N/A 07/12/2015   Procedure: ESOPHAGOGASTRODUODENOSCOPY (EGD) WITH PROPOFOL;  Surgeon: Rogene Houston, MD;  Location[facility];  Service: Endoscopy;  Laterality: N/A;  1:00  . IR GENERIC HISTORICAL  04/22/2016   IR US GUIDE VASC ACCESS RIGHT 04/22/2016 Markus Daft, MD [facility]-INTERV RAD  . IR GENERIC HISTORICAL  04/22/2016   IR FLUORO GUIDE PORT INSERTION RIGHT 04/22/2016 Markus Daft, MD [facility]INTER[facility]  . MASTECTOMY  April 2009   Left breast with lymph node resection    FAMILY HISTORY Family History  Problem Relation Age of Onset  . Heart attack Mother        Died age 59  . Heart disease Father        Died age 62  . Brain cancer Father        pt not sure if he actually had this  . Cancer Maternal Grandmother        dx. NOS cancer at older age; d. 60  . Heart attack Maternal Grandfather 79  . Colon cancer Paternal Grandmother        d. 8s  The patient's father died at age 76 she believes from heart disease. The patient's mother died at age 74 also from a myocardial infarction. The patient had one brother, no sisters. There is no history of breast or ovarian cancer in the family despite extensive G neurologic research   GYNECOLOGIC HISTORY:  No LMP recorded. Patient is not currently having periods (Reason: Chemotherapy).  menarche age 24, first live birth age 22, the patient is GX P1. She stopped having periods with chemotherapy in 2009 and these have not resumed.   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 They  recently moved to [place] from [place]. Their son [name] is 61 years old as of November 2017 . The patient is [place] [employer]      ADVANCED DIRECTIVES:  the patient's husband is her healthcare power of attorney. There is no living will in place    HEALTH MAINTENANCE: Social History  Substance Use Topics  . Smoking status: Never Smoker  . Smokeless tobacco: Never Used  . Alcohol use No     Colonoscopy:  PAP:  Bone density:   No Known Allergies  Current Outpatient Prescriptions  Medication Sig Dispense Refill  . docusate sodium (COLACE) 100 MG capsule Take 100 mg by mouth daily.     . hyaluronate sodium (RADIAPLEXRX) GEL Apply 1 application topically 2 (two) times daily.    Marland Kitchen HYDROcodone-acetaminophen (HYCET) 7.5-325 mg/15 ml solution Take 15 mLs by mouth 4 (four) times daily as needed for moderate pain. 120 mL 0  . ibuprofen (ADVIL,MOTRIN) 200 MG tablet Take 400 mg by mouth daily as needed.    Marland Kitchen  lidocaine-prilocaine (EMLA) cream Apply 1 application topically as needed. To port 1 hour before going to be accessed with needle. Cover with plastic wrap. 30 g 1  . lisinopril (PRINIVIL,ZESTRIL) 10 MG tablet Take 1 tablet (10 mg total) by mouth daily. (Patient not taking: Reported on 10/26/2016) 30 tablet 0  . oxyCODONE (OXYCONTIN) 30 MG 12 hr tablet Take 30 mg by mouth 2 (two) times daily. 60 each 0  . Oxycodone HCl 10 MG TABS Take 1 tablet (10 mg total) by mouth every 3 (three) hours as needed. 60 tablet 0  . pantoprazole (PROTONIX) 40 MG tablet TAKE 1 TABLET(40 MG) BY MOUTH TWICE DAILY BEFORE A MEAL 60 tablet 5  . polyethylene glycol (MIRALAX / GLYCOLAX) packet Take 17 grams by mouth every day. 14 each 6   No current facility-administered medications for this visit.     OBJECTIVE: Middle-aged white womanWho appears stated age  51:   01/08/17 1245  BP: 134/83  Pulse: 85  Resp: 17  Temp: 97.7 F (36.5 C)     Body mass index is 21.52 kg/m.    ECOG FS:1 - Symptomatic  but completely ambulatory Filed Weights   01/08/17 1245  Weight: 129 lb 4.8 oz (58.7 kg)   Sclerae unicteric, EOMs intact Oropharynx clear and moist No cervical or supraclavicular adenopathy, neck is supple Lungs no rales or rhonchi Heart regular rate and rhythm Abd soft, nontender, positive bowel sounds MSK no focal spinal tenderness, no upper extremity lymphedema Neuro: nonfocal, well oriented, appropriate affect Breasts: Deferred   LAB RESULTS:  CMP     Component Value Date/Time   NA 140 01/08/2017 1216   K 4.2 01/08/2017 1216   CL 105 05/20/2016 0930   CO2 28 01/08/2017 1216   GLUCOSE 101 01/08/2017 1216   BUN 11.1 01/08/2017 1216   CREATININE 0.7 01/08/2017 1216   CALCIUM 9.5 01/08/2017 1216   PROT 6.0 (L) 01/08/2017 1216   ALBUMIN 2.4 (L) 01/08/2017 1216   AST 71 (H) 01/08/2017 1216   ALT 34 01/08/2017 1216   ALKPHOS 203 (H) 01/08/2017 1216   BILITOT 0.49 01/08/2017 1216   GFRNONAA >60 05/20/2016 0930   GFRAA >60 05/20/2016 0930    INo results found for: SPEP, UPEP  Lab Results  Component Value Date   WBC 2.0 (L) 01/08/2017   NEUTROABS 0.9 (L) 01/08/2017   HGB 11.5 (L) 01/08/2017   HCT 35.8 01/08/2017   MCV 97.2 01/08/2017   PLT 153 01/08/2017      Chemistry      Component Value Date/Time   NA 140 01/08/2017 1216   K 4.2 01/08/2017 1216   CL 105 05/20/2016 0930   CO2 28 01/08/2017 1216   BUN 11.1 01/08/2017 1216   CREATININE 0.7 01/08/2017 1216      Component Value Date/Time   CALCIUM 9.5 01/08/2017 1216   ALKPHOS 203 (H) 01/08/2017 1216   AST 71 (H) 01/08/2017 1216   ALT 34 01/08/2017 1216   BILITOT 0.49 01/08/2017 1216       Lab Results  Component Value Date   LABCA2 98 (H) 02/27/2016    No components found for: [id number]  No results for input(s): INR in the last 168 hours.  Urinalysis    Component Value Date/Time   COLORURINE ORANGE (A) 05/17/2016 0852   APPEARANCEUR CLOUDY (A) 05/17/2016 0852   LABSPEC 1.027 05/17/2016 0852    PHURINE 6.0 05/17/2016 [place] 05/17/2016 0852   HGBUR NEGATIVE 05/17/2016 6503  BILIRUBINUR SMALL (A) 05/17/2016 0852   KETONESUR NEGATIVE 05/17/2016 0852   PROTEINUR 30 (A) 05/17/2016 0852   NITRITE NEGATIVE 05/17/2016 0852   LEUKOCYTESUR TRACE (A) 05/17/2016 0852     STUDIES: No results found.  ELIGIBLE FOR AVAILABLE RESEARCH PROTOCOL: no  ASSESSMENT: 51 y.o. BRCA negative [place] woman originally from [place]  (1) status post left breast upper inner quadrant biopsy 05/17/2007 for a clinical mT2 N2, stage IIIA invasive ductal breast cancer, strongly estrogen receptor positive, weakly progesterone receptor positive, HER-2 negative, with an MIB-1 of 52%  (2) neoadjuvant chemotherapy consisted of dose dense doxorubicin and cyclophosphamide 4 followed by weekly paclitaxel 12, started 06/10/2007, completed 09/28/2007  (3) status post left modified radical mastectomy 10/27/2007 at [facility] [phone]) removed that invasive ductal carcinoma, grade 2, pT2, pN1a (downstaged to IIB), estrogen receptor strongly positive, progesterone receptor weakly positive, HER-2 not amplified by immunohistochemistry or FISH with a signals ratio of 0.94, number per cell 3.4  (4) status post adjuvant radiation to the left chest wall as well as the left supraclavicular and axillary nodal regions (50.4 gray +10 Gray boost) given between 12/15/2007 on 01/31/2008  (5) on adjuvant tamoxifen 02/20/2008 through March 2016  METASTATIC DISEASE: March 2016, presenting with bone pain (6) staging studies April 2016 showed diffuse bony metastatic disease, intrathoracic adenopathy, and small lung lesions.  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  (a) brain MRI 07/16/2016 interpreted as requiring only further follow-up  (b) brain MRI 10/22/2016 read as stable  (10) repeat genetic testing 04/23/2016 through the [facility] Hereditary Cancer Panel offered by [facility] found no deleterious mutations in APC, ATM, BARD1, BMPR1A, BRCA1, BRCA2, BRIP1, CHD1, CDK4, CDKN2A, CHEK2, EPCAM (large rearrangement only), GREM1/SCG5, MLH1, MSH2, MSH6, MUTYH, NBN, PALB2, PMS2, POLD1, POLE, PTEN, RAD51C, RAD51D, SMAD4, STK11, and TP53  (11) gemcitabine started 04/24/2016,  given days 1 and 8 of each 21 day cycle  (a) day 8 cycle 1 delayed because of cytopenias--dose reduced and neupogen added; refuses neulasta/onpro  (b) CT scan of the chest 07/16/2016 shows decrease in the size of liver metastases.  (c) chest CT 10/15/2016 shows liver metastases to be stable, no lung metastases; new sternal mets?  (d) cycle 10 delayed 4 weeks because of intervening radiation  (e) cycle 11 delayed one week because of cytopenias  (12) Radiation treatment dates:   11/18/16 - 12/02/16 Site/dose:   c-spine treated to 30 Gy with 10 fractions of 3 Gy  PLAN: Jill Johnston's counts are low today. She has a good hemoglobin and platelet count today but the neutrophils are only is 0.9. We are going to delay her treatment 1 week, give her 3 doses of Neupogen instead of 2, and increase the Neupogen dose to 480 instead of 300. Hopefully by doing that we will be able to keep her treatments on schedule.  However she is due for restaging. She will have a CT of the chest 01/08/2017. Her most recent CA-27-29 had  risen some so it is possible we will see some tumor progression. In that case we can consider switching to CMF chemotherapy, which has the advantage of being given every 3 weeks.  She is also scheduled for repeat MRI of the brain 01/18/2017 and follow-up with the brain tumor group 01/20/2017. If she needs further treatment to the brain of course that would take precedence  At the upcoming 01/14/2017 visit we will also a given review the possibility of using Neulasta, which would save them quite a bit of travel here. She had some bone pain with this in the past but may be willing to give it another try at this point.   [name], MD   01/08/2017 1:24 PM Medical Oncology and Hematology [facility] [address] [place], [place] [address] Tel. [phone]    Fax. [phone]

## 2016-12-25 NOTE — Telephone Encounter (Signed)
Ok to treat per MD review with platelets of 74,000.

## 2016-12-25 NOTE — Telephone Encounter (Signed)
Per review of CMET with AST 107 reviewed by Dr Yvette Rack- ok to proceed with treatment.

## 2016-12-25 NOTE — Progress Notes (Signed)
[place]  Telephone:([phone] Fax:(336) [phone]     ID: Jill Johnston DOB: 09/02/1965  MR#: [id number]  MOQ#:[id number]  Patient Care Team: [name], MD as PCP - General (Oncology) [name], MD as Consulting Physician (Radiation Oncology) Muss, Demaris Callander as Consulting Physician (Internal Medicine) [name], MD as Consulting Physician (Internal Medicine) Clydell Hakim, MD as Consulting Physician (Anesthesiology) [name], MD OTHER MD:  CHIEF COMPLAINT: Estrogen receptor positive metastatic breast cancer  CURRENT TREATMENT: Gemcitabine, zolendronate   BREAST CANCER HISTORY: From Dr. 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 The breast cancer was ER + 100%, PR 1%, HER 2 IHC/FISH negative, Ki67 52%, BRCA 1/2 negative, initial CA 2729 WNL. She had neoadjuvant AC x4 taxol x12 dose dense from 06-10-2007 thru 09-28-2007 by Dr B.Darovsky. 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 She had radiation to chest wall, supraclavicular region and left axilla 50.4 Gy with boost to mastectomy scar to 60.4 Gy, by Dr [name] from 12-15-2007 thru 01-31-2008. She was on tamoxifen from 02-20-2008 thru 09-25-2014. She developed pain in ribs 08-2014, with CA 2729 52 and PET CT and MRI with diffuse bone metastatic disease, no cord compression, small lung lesions and intrathoracic adenopathy. 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 She began Denosumab 120 mcg monthly starting 4-7-201, and letrozole + palbociclib beginning4-12-2014. She was seen in consultation by Dr [name] [phone]. Palbociclib dose decreased to 100 mg daily x 21 q  28 days due to neutropenia. CBC on 10-25-15: WBC 2.1, Hgb 13, plt 186, ANC 0.9 PET 09-25-2014 in [facility] system and CT CAP at [place] 10-25-15 with no clear pulmonary involvement, stable 2 cm lesion at dome of liver and 11 mm left hepatic lobe lesion. She had right tomo mammogram at [facility] 09-17-15, with heterogeneously dense breast tissue but no other mammographic findings of concern.  CA 2729 increased from 38 in 08-2015 to 49 in April 2017 and 65 in May 2017. CT CAP / PET 03-16-16 showed progression lung, liver and bone. Letrozole and ibrance DCd on 03-19-16, with plans to begin chemotherapy. 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 She elected whole brain RT, given by Dr [name] ~ 03-23-16 thru 04-10-16.  She had first gemzar on 04-24-16  She had evaluation for vaginal bleeding "from polyp", follows yearly with gynecologist in [place], up to date."  Her subsequent history is as detailed below.  INTERVAL HISTORY: Jill Johnston returns today for follow-up and treatment of her estrogen receptor positive breast cancer, stage IV. She continues on Gemzar days 1 and 8 of each 21 day cycle, with Neulasta given on days 4 and 5 of each dose. Today is day 8 cycle 10 of these treatments.  REVIEW OF SYSTEMS: She has lost some weight, but partly this is due to the radiation to her neck, which caused her some swallowing problems, and also the fact that she recently had a root canal and that affected her ability to chew. Her weight dropped from 142 pounds to 132 pounds by mid June, but it remains stable at 132 pounds today. Aside from  that, she still feels tired but recently was able to [place] a tennis exhibition which she greatly enjoyed. Of course she is not able to play herself. 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 There is still considering a trip  to [place]. Aside from these issues a detailed review of systems today was stable   PAST MEDICAL HISTORY: Past Medical History:  Diagnosis Date  . Anxiety   . Breast cancer ([place])    Stage IV, ER positive left upper outer quadrant breast cancer with metastasis to bone  . Breast cancer metastasized to multiple sites ([place]) 04/12/2016   bone, liver, lung and brain  . GERD (gastroesophageal reflux disease)   . Hypertension     PAST SURGICAL HISTORY: Past Surgical History:  Procedure Laterality Date  . CHOLECYSTECTOMY    . ESOPHAGOGASTRODUODENOSCOPY (EGD) WITH PROPOFOL N/A 07/12/2015   Procedure: ESOPHAGOGASTRODUODENOSCOPY (EGD) WITH PROPOFOL;  Surgeon: Rogene Houston, MD;  Location: [facility];  Service: Endoscopy;  Laterality: N/A;  1:00  . IR GENERIC HISTORICAL  04/22/2016   IR US GUIDE VASC ACCESS RIGHT 04/22/2016 Markus Daft, MD [facility]INTER[facility] RAD  . IR GENERIC HISTORICAL  04/22/2016   IR FLUORO GUIDE PORT INSERTION RIGHT 04/22/2016 Markus Daft, MD [facility]  . MASTECTOMY  April 2009   Left breast with lymph node resection    FAMILY HISTORY Family History  Problem Relation Age of Onset  . Heart attack Mother        Died age 54  . Heart disease Father        Died age 30  . Brain cancer Father        pt not sure if he actually had this  . Cancer Maternal Grandmother        dx. NOS cancer at older age; d. 25  . Heart attack Maternal Grandfather 79  . Colon cancer Paternal Grandmother        d. 14s  The patient's father died at age 72 she believes from heart disease. The patient's mother died at age 49 also from a myocardial infarction. The patient had one brother, no sisters. There is no history of breast or ovarian cancer in the family despite extensive G neurologic research   GYNECOLOGIC HISTORY:  No LMP recorded. Patient is not currently having periods (Reason: Chemotherapy).  menarche age 10, first live birth age 40, the patient is GX P1. She stopped having periods with  chemotherapy in 2009 and these have not resumed.   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 They recently moved to [place] from [place]. Their son [name] is 67 years old as of November 2017 . The patient is [place] Orthodox      ADVANCED DIRECTIVES:  the patient's husband is her healthcare power of attorney. There is no living will in place    HEALTH MAINTENANCE: Social History  Substance Use Topics  . Smoking status: Never Smoker  . Smokeless tobacco: Never Used  . Alcohol use No     Colonoscopy:  PAP:  Bone density:   No Known Allergies  Current Outpatient Prescriptions  Medication Sig Dispense Refill  . docusate sodium (COLACE) 100 MG capsule Take 100 mg by mouth daily.     . hyaluronate sodium (RADIAPLEXRX) GEL Apply 1 application topically 2 (two) times daily.    Marland Kitchen HYDROcodone-acetaminophen (HYCET) 7.5-325 mg/15 ml  solution Take 15 mLs by mouth 4 (four) times daily as needed for moderate pain. 120 mL 0  . ibuprofen (ADVIL,MOTRIN) 200 MG tablet Take 400 mg by mouth daily as needed.    . lidocaine-prilocaine (EMLA) cream Apply 1 application topically as needed. To port 1 hour before going to be accessed with needle. Cover with plastic wrap. 30 g 1  . lisinopril (PRINIVIL,ZESTRIL) 10 MG tablet Take 1 tablet (10 mg total) by mouth daily. (Patient not taking: Reported on 10/26/2016) 30 tablet 0  . oxyCODONE (OXYCONTIN) 30 MG 12 hr tablet Take 30 mg by mouth 2 (two) times daily. 60 each 0  . Oxycodone HCl 10 MG TABS Take 1 tablet (10 mg total) by mouth every 3 (three) hours as needed. 60 tablet 0  . pantoprazole (PROTONIX) 40 MG tablet TAKE 1 TABLET(40 MG) BY MOUTH TWICE DAILY BEFORE A MEAL 60 tablet 5  . polyethylene glycol (MIRALAX / GLYCOLAX) packet Take 17 grams by mouth every day. 14 each 6   No current facility-administered medications for this visit.     Facility-Administered Medications Ordered in Other Visits  Medication Dose Route Frequency Provider Last Rate Last Dose  . sodium chloride flush (NS) 0.9 % injection 10 mL  10 mL Intravenous PRN Macalister Arnaud, Virgie Dad, MD        OBJECTIVE: Middle-aged white woman Who appears stated age  Vitals:   12/25/16 1223  BP: 130/85  Pulse: 81  Resp: 18  Temp: 98.1 F (36.7 C)     Body mass index is 22.1 kg/m.    ECOG FS:1 - Symptomatic but completely ambulatory Filed Weights   12/25/16 1223  Weight: 132 lb 12.8 oz (60.2 kg)   Sclerae unicteric, pupils round and equal Oropharynx clear and moist No cervical or supraclavicular adenopathy Lungs no rales or rhonchi Heart regular rate and rhythm Abd soft, nontender, positive bowel sounds MSK no focal spinal tenderness, no upper extremity lymphedema Neuro: nonfocal, well oriented, appropriate affect Breasts: Deferred   LAB RESULTS:  CMP     Component Value Date/Time   NA 138 12/18/2016 1258   K 4.2 12/18/2016 1258   CL 105 05/20/2016 0930   CO2 24 12/18/2016 1258   GLUCOSE 103 12/18/2016 1258   BUN 14.1 12/18/2016 1258   CREATININE 0.8 12/18/2016 1258   CALCIUM 9.3 12/18/2016 1258   PROT 6.5 12/18/2016 1258   ALBUMIN 2.9 (L) 12/18/2016 1258   AST 96 (H) 12/18/2016 1258   ALT 43 12/18/2016 1258   ALKPHOS 163 (H) 12/18/2016 1258   BILITOT 0.64 12/18/2016 1258   GFRNONAA >60 05/20/2016 0930   GFRAA >60 05/20/2016 0930    INo results found for: SPEP, UPEP  Lab Results  Component Value Date   WBC 4.3 12/18/2016   NEUTROABS 2.7 12/18/2016   HGB 12.3 12/18/2016   HCT 39.5 12/18/2016   MCV 98.3 12/18/2016   PLT 143 (L) 12/18/2016      Chemistry      Component Value Date/Time   NA 138 12/18/2016 1258   K 4.2 12/18/2016 1258   CL 105 05/20/2016 0930   CO2 24 12/18/2016 1258   BUN 14.1 12/18/2016 1258   CREATININE 0.8 12/18/2016 1258      Component Value Date/Time   CALCIUM 9.3 12/18/2016 1258   ALKPHOS 163 (H)  12/18/2016 1258   AST 96 (H) 12/18/2016 1258   ALT 43 12/18/2016 1258   BILITOT 0.64 12/18/2016 1258       Lab Results  Component Value Date   LABCA2 98 (H) 02/27/2016    No components found for: [id number]  No results for input(s): INR in the last 168 hours.  Urinalysis    Component Value Date/Time   COLORURINE ORANGE (A) 05/17/2016 0852   APPEARANCEUR CLOUDY (A) 05/17/2016 0852   LABSPEC 1.027 05/17/2016 0852   PHURINE 6.0 05/17/2016 0852   GLUCOSEU NEGATIVE 05/17/2016 0852   HGBUR NEGATIVE 05/17/2016 0852   BILIRUBINUR SMALL (A) 05/17/2016 0852   KETONESUR NEGATIVE 05/17/2016 0852   PROTEINUR 30 (A) 05/17/2016 0852   NITRITE NEGATIVE 05/17/2016 0852   LEUKOCYTESUR TRACE (A) 05/17/2016 0852     STUDIES:  No results found.  ELIGIBLE FOR AVAILABLE RESEARCH PROTOCOL: no  ASSESSMENT: 51 y.o. BRCA negative [place] woman originally from [place]  (1) status post left breast upper inner quadrant biopsy 05/17/2007 for a clinical mT2 N2, stage IIIA invasive ductal breast cancer, strongly estrogen receptor positive, weakly progesterone receptor positive, HER-2 negative, with an MIB-1 of 52%  (2) neoadjuvant chemotherapy consisted of dose dense doxorubicin and cyclophosphamide 4 followed by weekly paclitaxel 12, started 06/10/2007, completed 09/28/2007  (3) status post left modified radical mastectomy 10/27/2007 at [facility] [phone]) removed that invasive ductal carcinoma, grade 2, pT2, pN1a (downstaged to IIB), estrogen receptor strongly positive, progesterone receptor weakly positive, HER-2 not amplified by immunohistochemistry or FISH with a signals ratio of 0.94, number per cell 3.4  (4) status post adjuvant radiation to the left chest wall as well as the left supraclavicular and axillary nodal regions (50.4 gray +10 Gray boost) given between 12/15/2007 on 01/31/2008  (5) on adjuvant tamoxifen 02/20/2008 through March 2016  METASTATIC DISEASE: March 2016, presenting  with bone pain (6) staging studies April 2016 showed diffuse bony metastatic disease, intrathoracic adenopathy, and small lung lesions.  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, estrogen receptor strongly positive, progesterone receptor and HER-2 negative  (b) CA-27-29 is informative  (7) denosumab/Xgeva started April 2016,  (a) zolendronate started   (8) letrozole/palbociclib started April 2016,  discontinued September 2017 with progression  (a) restaging studies September 2017 document bone, lung, and liver involvement  (b) brain MRI September 2017 c/w leptomeningeal spread, +/- parietal metastases  (9) whole brain irradiation 03/30/16 - 04/10/16 Site/dose:   The whole brain was treated to 30 Gy in 10 fractions of 3 Gy.  (a) brain MRI 07/16/2016 interpreted as requiring only further follow-up  (b) brain MRI 10/22/2016 read as stable  (10) repeat genetic testing 04/23/2016 through the [facility] Hereditary Cancer Panel offered by [facility] found no deleterious mutations in APC, ATM, BARD1, BMPR1A, BRCA1, BRCA2, BRIP1, CHD1, CDK4, CDKN2A, CHEK2, EPCAM (large rearrangement only), GREM1/SCG5, MLH1, MSH2, MSH6, MUTYH, NBN, PALB2, PMS2, POLD1, POLE, PTEN, RAD51C, RAD51D, SMAD4, STK11, and TP53  (11) gemcitabine started 04/24/2016,  given days 1 and 8 of each 21 day cycle  (a) day 8 cycle 1 delayed because of cytopenias--dose reduced and neupogen added; refuses neulasta/onpro  (b) CT scan of the chest 07/16/2016 shows decrease in the size of liver metastases.  (c) chest CT 10/15/2016 shows liver metastases to be stable, no lung metastases; new sternal mets?  (12) Radiation treatment dates:   11/18/16 - 12/02/16 Site/dose:   c-spine treated to 30 Gy with 10 fractions of 3 Gy  PLAN: Haynes Dage continues to tolerate the gemcitabine treatments remarkably well. These have been successful so far at controlling her cancer and I am scheduling her for a  repeat CT scan of the chest  on July 18, before she returns to see me on July 20.  She is already scheduled for a repeat brain MRI on July 23.  I refilled her OxyContin/oxycodone medications today. Her use of these has been very stable. She has a good bowel prophylaxis regimen in place.  Otherwise she will receive her Neulasta shots next week and begin her next cycle of gemcitabine 01/08/2017. She knows to call for any problems that may develop before her next visit here.   [name], MD   12/25/2016 12:26 PM Medical Oncology and Hematology [facility] [address] [place], [place] [address] Tel. [phone]    Fax. [phone]

## 2016-12-28 ENCOUNTER — Ambulatory Visit (HOSPITAL_BASED_OUTPATIENT_CLINIC_OR_DEPARTMENT_OTHER): Payer: BLUE CROSS/BLUE SHIELD

## 2016-12-28 VITALS — BP 105/80 | HR 90 | Temp 97.5°F | Resp 20

## 2016-12-28 DIAGNOSIS — C50912 Malignant neoplasm of unspecified site of left female breast: Secondary | ICD-10-CM | POA: Diagnosis not present

## 2016-12-28 DIAGNOSIS — Z5189 Encounter for other specified aftercare: Secondary | ICD-10-CM

## 2016-12-28 DIAGNOSIS — C7951 Secondary malignant neoplasm of bone: Secondary | ICD-10-CM

## 2016-12-28 MED ORDER — TBO-FILGRASTIM 300 MCG/0.5ML ~~LOC~~ SOSY
300.0000 ug | PREFILLED_SYRINGE | Freq: Once | SUBCUTANEOUS | Status: AC
  Administered 2016-12-28: 300 ug via SUBCUTANEOUS
  Filled 2016-12-28: qty 0.5

## 2016-12-28 NOTE — Patient Instructions (Signed)
Tbo-Filgrastim injection What is this medicine? TBO-FILGRASTIM (T B O fil GRA stim) is a granulocyte colony-stimulating factor that stimulates the growth of neutrophils, a type of white blood cell important in the body's fight against infection. It is used to reduce the incidence of fever and infection in patients with certain types of cancer who are receiving chemotherapy that affects the bone marrow. This medicine may be used for other purposes; ask your health care provider or pharmacist if you have questions. COMMON BRAND NAME(S): Granix What should I tell my health care provider before I take this medicine? They need to know if you have any of these conditions: -bone scan or tests planned -kidney disease -sickle cell anemia -an unusual or allergic reaction to tbo-filgrastim, filgrastim, pegfilgrastim, other medicines, foods, dyes, or preservatives -pregnant or trying to get pregnant -breast-feeding How should I use this medicine? This medicine is for injection under the skin. If you get this medicine at home, you will be taught how to prepare and give this medicine. Refer to the Instructions for Use that come with your medication packaging. Use exactly as directed. Take your medicine at regular intervals. Do not take your medicine more often than directed. It is important that you put your used needles and syringes in a special sharps container. Do not put them in a trash can. If you do not have a sharps container, call your pharmacist or healthcare provider to get one. Talk to your pediatrician regarding the use of this medicine in children. Special care may be needed. Overdosage: If you think you have taken too much of this medicine contact a poison control center or emergency room at once. NOTE: This medicine is only for you. Do not share this medicine with others. What if I miss a dose? It is important not to miss your dose. Call your doctor or health care professional if you miss a  dose. What may interact with this medicine? This medicine may interact with the following medications: -medicines that may cause a release of neutrophils, such as lithium This list may not describe all possible interactions. Give your health care provider a list of all the medicines, herbs, non-prescription drugs, or dietary supplements you use. Also tell them if you smoke, drink alcohol, or use illegal drugs. Some items may interact with your medicine. What should I watch for while using this medicine? You may need blood work done while you are taking this medicine. What side effects may I notice from receiving this medicine? Side effects that you should report to your doctor or health care professional as soon as possible: -allergic reactions like skin rash, itching or hives, swelling of the face, lips, or tongue -blood in the urine -dark urine -dizziness -fast heartbeat -feeling faint -shortness of breath or breathing problems -signs and symptoms of infection like fever or chills; cough; or sore throat -signs and symptoms of kidney injury like trouble passing urine or change in the amount of urine -stomach or side pain, or pain at the shoulder -sweating -swelling of the legs, ankles, or abdomen -tiredness Side effects that usually do not require medical attention (report to your doctor or health care professional if they continue or are bothersome): -bone pain -headache -muscle pain -vomiting This list may not describe all possible side effects. Call your doctor for medical advice about side effects. You may report side effects to FDA at 1-800-FDA-1088. Where should I keep my medicine? Keep out of the reach of children. Store in a refrigerator between   2 and 8 degrees C (36 and 46 degrees F). Keep in carton to protect from light. Throw away this medicine if it is left out of the refrigerator for more than 5 consecutive days. Throw away any unused medicine after the expiration  date. NOTE: This sheet is a summary. It may not cover all possible information. If you have questions about this medicine, talk to your doctor, pharmacist, or health care provider.  2018 Elsevier/Gold Standard (2015-08-05 19:07:04)  

## 2016-12-29 ENCOUNTER — Ambulatory Visit (HOSPITAL_BASED_OUTPATIENT_CLINIC_OR_DEPARTMENT_OTHER): Payer: BLUE CROSS/BLUE SHIELD

## 2016-12-29 VITALS — BP 104/79 | HR 98 | Temp 97.5°F | Resp 18

## 2016-12-29 DIAGNOSIS — C7951 Secondary malignant neoplasm of bone: Secondary | ICD-10-CM | POA: Diagnosis not present

## 2016-12-29 DIAGNOSIS — Z5189 Encounter for other specified aftercare: Secondary | ICD-10-CM

## 2016-12-29 DIAGNOSIS — C50912 Malignant neoplasm of unspecified site of left female breast: Secondary | ICD-10-CM | POA: Diagnosis not present

## 2016-12-29 MED ORDER — TBO-FILGRASTIM 300 MCG/0.5ML ~~LOC~~ SOSY
300.0000 ug | PREFILLED_SYRINGE | Freq: Once | SUBCUTANEOUS | Status: AC
  Administered 2016-12-29: 300 ug via SUBCUTANEOUS
  Filled 2016-12-29: qty 0.5

## 2016-12-29 NOTE — Patient Instructions (Signed)
Tbo-Filgrastim injection What is this medicine? TBO-FILGRASTIM (T B O fil GRA stim) is a granulocyte colony-stimulating factor that stimulates the growth of neutrophils, a type of white blood cell important in the body's fight against infection. It is used to reduce the incidence of fever and infection in patients with certain types of cancer who are receiving chemotherapy that affects the bone marrow. This medicine may be used for other purposes; ask your health care provider or pharmacist if you have questions. COMMON BRAND NAME(S): Granix What should I tell my health care provider before I take this medicine? They need to know if you have any of these conditions: -bone scan or tests planned -kidney disease -sickle cell anemia -an unusual or allergic reaction to tbo-filgrastim, filgrastim, pegfilgrastim, other medicines, foods, dyes, or preservatives -pregnant or trying to get pregnant -breast-feeding How should I use this medicine? This medicine is for injection under the skin. If you get this medicine at home, you will be taught how to prepare and give this medicine. Refer to the Instructions for Use that come with your medication packaging. Use exactly as directed. Take your medicine at regular intervals. Do not take your medicine more often than directed. It is important that you put your used needles and syringes in a special sharps container. Do not put them in a trash can. If you do not have a sharps container, call your pharmacist or healthcare provider to get one. Talk to your pediatrician regarding the use of this medicine in children. Special care may be needed. Overdosage: If you think you have taken too much of this medicine contact a poison control center or emergency room at once. NOTE: This medicine is only for you. Do not share this medicine with others. What if I miss a dose? It is important not to miss your dose. Call your doctor or health care professional if you miss a  dose. What may interact with this medicine? This medicine may interact with the following medications: -medicines that may cause a release of neutrophils, such as lithium This list may not describe all possible interactions. Give your health care provider a list of all the medicines, herbs, non-prescription drugs, or dietary supplements you use. Also tell them if you smoke, drink alcohol, or use illegal drugs. Some items may interact with your medicine. What should I watch for while using this medicine? You may need blood work done while you are taking this medicine. What side effects may I notice from receiving this medicine? Side effects that you should report to your doctor or health care professional as soon as possible: -allergic reactions like skin rash, itching or hives, swelling of the face, lips, or tongue -blood in the urine -dark urine -dizziness -fast heartbeat -feeling faint -shortness of breath or breathing problems -signs and symptoms of infection like fever or chills; cough; or sore throat -signs and symptoms of kidney injury like trouble passing urine or change in the amount of urine -stomach or side pain, or pain at the shoulder -sweating -swelling of the legs, ankles, or abdomen -tiredness Side effects that usually do not require medical attention (report to your doctor or health care professional if they continue or are bothersome): -bone pain -headache -muscle pain -vomiting This list may not describe all possible side effects. Call your doctor for medical advice about side effects. You may report side effects to FDA at 1-800-FDA-1088. Where should I keep my medicine? Keep out of the reach of children. Store in a refrigerator between   2 and 8 degrees C (36 and 46 degrees F). Keep in carton to protect from light. Throw away this medicine if it is left out of the refrigerator for more than 5 consecutive days. Throw away any unused medicine after the expiration  date. NOTE: This sheet is a summary. It may not cover all possible information. If you have questions about this medicine, talk to your doctor, pharmacist, or health care provider.  2018 Elsevier/Gold Standard (2015-08-05 19:07:04)  

## 2017-01-01 ENCOUNTER — Ambulatory Visit: Payer: BLUE CROSS/BLUE SHIELD | Admitting: Oncology

## 2017-01-01 ENCOUNTER — Other Ambulatory Visit: Payer: BLUE CROSS/BLUE SHIELD

## 2017-01-01 ENCOUNTER — Encounter (HOSPITAL_COMMUNITY): Payer: Self-pay

## 2017-01-06 ENCOUNTER — Ambulatory Visit: Payer: Self-pay | Admitting: Urology

## 2017-01-07 ENCOUNTER — Other Ambulatory Visit: Payer: Self-pay | Admitting: Oncology

## 2017-01-08 ENCOUNTER — Other Ambulatory Visit: Payer: Self-pay | Admitting: *Deleted

## 2017-01-08 ENCOUNTER — Ambulatory Visit (HOSPITAL_BASED_OUTPATIENT_CLINIC_OR_DEPARTMENT_OTHER): Payer: BLUE CROSS/BLUE SHIELD | Admitting: Oncology

## 2017-01-08 ENCOUNTER — Ambulatory Visit (HOSPITAL_BASED_OUTPATIENT_CLINIC_OR_DEPARTMENT_OTHER): Payer: BLUE CROSS/BLUE SHIELD

## 2017-01-08 ENCOUNTER — Ambulatory Visit: Payer: BLUE CROSS/BLUE SHIELD

## 2017-01-08 ENCOUNTER — Other Ambulatory Visit (HOSPITAL_BASED_OUTPATIENT_CLINIC_OR_DEPARTMENT_OTHER): Payer: BLUE CROSS/BLUE SHIELD

## 2017-01-08 VITALS — BP 134/83 | HR 85 | Temp 97.7°F | Resp 17 | Ht 65.0 in | Wt 129.3 lb

## 2017-01-08 DIAGNOSIS — C50912 Malignant neoplasm of unspecified site of left female breast: Secondary | ICD-10-CM

## 2017-01-08 DIAGNOSIS — Z170 Estrogen receptor positive status [ER+]: Secondary | ICD-10-CM

## 2017-01-08 DIAGNOSIS — C7949 Secondary malignant neoplasm of other parts of nervous system: Secondary | ICD-10-CM

## 2017-01-08 DIAGNOSIS — C787 Secondary malignant neoplasm of liver and intrahepatic bile duct: Secondary | ICD-10-CM | POA: Diagnosis not present

## 2017-01-08 DIAGNOSIS — C50212 Malignant neoplasm of upper-inner quadrant of left female breast: Secondary | ICD-10-CM

## 2017-01-08 DIAGNOSIS — C7802 Secondary malignant neoplasm of left lung: Secondary | ICD-10-CM

## 2017-01-08 DIAGNOSIS — C50919 Malignant neoplasm of unspecified site of unspecified female breast: Secondary | ICD-10-CM

## 2017-01-08 DIAGNOSIS — C7951 Secondary malignant neoplasm of bone: Principal | ICD-10-CM

## 2017-01-08 DIAGNOSIS — Z5189 Encounter for other specified aftercare: Secondary | ICD-10-CM

## 2017-01-08 DIAGNOSIS — Z95828 Presence of other vascular implants and grafts: Secondary | ICD-10-CM

## 2017-01-08 DIAGNOSIS — C50922 Malignant neoplasm of unspecified site of left male breast: Secondary | ICD-10-CM

## 2017-01-08 LAB — COMPREHENSIVE METABOLIC PANEL
ALBUMIN: 2.4 g/dL — AB (ref 3.5–5.0)
ALK PHOS: 203 U/L — AB (ref 40–150)
ALT: 34 U/L (ref 0–55)
ANION GAP: 8 meq/L (ref 3–11)
AST: 71 U/L — ABNORMAL HIGH (ref 5–34)
BUN: 11.1 mg/dL (ref 7.0–26.0)
CO2: 28 mEq/L (ref 22–29)
Calcium: 9.5 mg/dL (ref 8.4–10.4)
Chloride: 103 mEq/L (ref 98–109)
Creatinine: 0.7 mg/dL (ref 0.6–1.1)
GLUCOSE: 101 mg/dL (ref 70–140)
POTASSIUM: 4.2 meq/L (ref 3.5–5.1)
SODIUM: 140 meq/L (ref 136–145)
Total Bilirubin: 0.49 mg/dL (ref 0.20–1.20)
Total Protein: 6 g/dL — ABNORMAL LOW (ref 6.4–8.3)

## 2017-01-08 LAB — CBC WITH DIFFERENTIAL/PLATELET
BASO%: 1.3 % (ref 0.0–2.0)
BASOS ABS: 0 10*3/uL (ref 0.0–0.1)
EOS ABS: 0 10*3/uL (ref 0.0–0.5)
EOS%: 1.8 % (ref 0.0–7.0)
HCT: 35.8 % (ref 34.8–46.6)
HEMOGLOBIN: 11.5 g/dL — AB (ref 11.6–15.9)
LYMPH%: 30.3 % (ref 14.0–49.7)
MCH: 31.4 pg (ref 25.1–34.0)
MCHC: 32.2 g/dL (ref 31.5–36.0)
MCV: 97.2 fL (ref 79.5–101.0)
MONO#: 0.5 10*3/uL (ref 0.1–0.9)
MONO%: 22.9 % — AB (ref 0.0–14.0)
NEUT#: 0.9 10*3/uL — ABNORMAL LOW (ref 1.5–6.5)
NEUT%: 43.7 % (ref 38.4–76.8)
Platelets: 153 10*3/uL (ref 145–400)
RBC: 3.68 10*6/uL — AB (ref 3.70–5.45)
RDW: 17.8 % — ABNORMAL HIGH (ref 11.2–14.5)
WBC: 2 10*3/uL — ABNORMAL LOW (ref 3.9–10.3)
lymph#: 0.6 10*3/uL — ABNORMAL LOW (ref 0.9–3.3)

## 2017-01-08 MED ORDER — SODIUM CHLORIDE 0.9% FLUSH
10.0000 mL | Freq: Once | INTRAVENOUS | Status: AC
  Administered 2017-01-08: 10 mL
  Filled 2017-01-08: qty 10

## 2017-01-08 MED ORDER — FILGRASTIM 480 MCG/0.8ML IJ SOSY
480.0000 ug | PREFILLED_SYRINGE | Freq: Once | INTRAMUSCULAR | Status: DC
Start: 2017-01-08 — End: 2017-01-08

## 2017-01-08 MED ORDER — HEPARIN SOD (PORK) LOCK FLUSH 100 UNIT/ML IV SOLN
500.0000 [IU] | Freq: Once | INTRAVENOUS | Status: AC
  Administered 2017-01-08: 500 [IU]
  Filled 2017-01-08: qty 5

## 2017-01-08 MED ORDER — TBO-FILGRASTIM 480 MCG/0.8ML ~~LOC~~ SOSY
480.0000 ug | PREFILLED_SYRINGE | Freq: Once | SUBCUTANEOUS | Status: AC
  Administered 2017-01-08: 480 ug via SUBCUTANEOUS
  Filled 2017-01-08: qty 0.8

## 2017-01-08 NOTE — Patient Instructions (Signed)
Pegfilgrastim injection What is this medicine? PEGFILGRASTIM (PEG fil gra stim) is a long-acting granulocyte colony-stimulating factor that stimulates the growth of neutrophils, a type of white blood cell important in the body's fight against infection. It is used to reduce the incidence of fever and infection in patients with certain types of cancer who are receiving chemotherapy that affects the bone marrow, and to increase survival after being exposed to high doses of radiation. This medicine may be used for other purposes; ask your health care provider or pharmacist if you have questions. COMMON BRAND NAME(S): Neulasta What should I tell my health care provider before I take this medicine? They need to know if you have any of these conditions: -kidney disease -latex allergy -ongoing radiation therapy -sickle cell disease -skin reactions to acrylic adhesives (On-Body Injector only) -an unusual or allergic reaction to pegfilgrastim, filgrastim, other medicines, foods, dyes, or preservatives -pregnant or trying to get pregnant -breast-feeding How should I use this medicine? This medicine is for injection under the skin. If you get this medicine at home, you will be taught how to prepare and give the pre-filled syringe or how to use the On-body Injector. Refer to the patient Instructions for Use for detailed instructions. Use exactly as directed. Tell your healthcare provider immediately if you suspect that the On-body Injector may not have performed as intended or if you suspect the use of the On-body Injector resulted in a missed or partial dose. It is important that you put your used needles and syringes in a special sharps container. Do not put them in a trash can. If you do not have a sharps container, call your pharmacist or healthcare provider to get one. Talk to your pediatrician regarding the use of this medicine in children. While this drug may be prescribed for selected conditions,  precautions do apply. Overdosage: If you think you have taken too much of this medicine contact a poison control center or emergency room at once. NOTE: This medicine is only for you. Do not share this medicine with others. What if I miss a dose? It is important not to miss your dose. Call your doctor or health care professional if you miss your dose. If you miss a dose due to an On-body Injector failure or leakage, a new dose should be administered as soon as possible using a single prefilled syringe for manual use. What may interact with this medicine? Interactions have not been studied. Give your health care provider a list of all the medicines, herbs, non-prescription drugs, or dietary supplements you use. Also tell them if you smoke, drink alcohol, or use illegal drugs. Some items may interact with your medicine. This list may not describe all possible interactions. Give your health care provider a list of all the medicines, herbs, non-prescription drugs, or dietary supplements you use. Also tell them if you smoke, drink alcohol, or use illegal drugs. Some items may interact with your medicine. What should I watch for while using this medicine? You may need blood work done while you are taking this medicine. If you are going to need a MRI, CT scan, or other procedure, tell your doctor that you are using this medicine (On-Body Injector only). What side effects may I notice from receiving this medicine? Side effects that you should report to your doctor or health care professional as soon as possible: -allergic reactions like skin rash, itching or hives, swelling of the face, lips, or tongue -dizziness -fever -pain, redness, or irritation at site   where injected -pinpoint red spots on the skin -red or dark-brown urine -shortness of breath or breathing problems -stomach or side pain, or pain at the shoulder -swelling -tiredness -trouble passing urine or change in the amount of urine Side  effects that usually do not require medical attention (report to your doctor or health care professional if they continue or are bothersome): -bone pain -muscle pain This list may not describe all possible side effects. Call your doctor for medical advice about side effects. You may report side effects to FDA at 1-800-FDA-1088. Where should I keep my medicine? Keep out of the reach of children. Store pre-filled syringes in a refrigerator between 2 and 8 degrees C (36 and 46 degrees F). Do not freeze. Keep in carton to protect from light. Throw away this medicine if it is left out of the refrigerator for more than 48 hours. Throw away any unused medicine after the expiration date. NOTE: This sheet is a summary. It may not cover all possible information. If you have questions about this medicine, talk to your doctor, pharmacist, or health care provider.  2018 Elsevier/Gold Standard (2016-06-11 12:58:03)  

## 2017-01-09 LAB — CANCER ANTIGEN 27.29: CA 27.29: 108.5 U/mL — ABNORMAL HIGH (ref 0.0–38.6)

## 2017-01-12 ENCOUNTER — Ambulatory Visit (HOSPITAL_BASED_OUTPATIENT_CLINIC_OR_DEPARTMENT_OTHER): Payer: BLUE CROSS/BLUE SHIELD

## 2017-01-12 VITALS — BP 128/78 | HR 88 | Temp 97.6°F | Resp 16

## 2017-01-12 DIAGNOSIS — C7949 Secondary malignant neoplasm of other parts of nervous system: Secondary | ICD-10-CM

## 2017-01-12 DIAGNOSIS — C787 Secondary malignant neoplasm of liver and intrahepatic bile duct: Secondary | ICD-10-CM | POA: Diagnosis not present

## 2017-01-12 DIAGNOSIS — C7802 Secondary malignant neoplasm of left lung: Secondary | ICD-10-CM

## 2017-01-12 DIAGNOSIS — C50212 Malignant neoplasm of upper-inner quadrant of left female breast: Secondary | ICD-10-CM

## 2017-01-12 DIAGNOSIS — C7951 Secondary malignant neoplasm of bone: Secondary | ICD-10-CM | POA: Diagnosis not present

## 2017-01-12 DIAGNOSIS — Z5189 Encounter for other specified aftercare: Secondary | ICD-10-CM

## 2017-01-12 DIAGNOSIS — C50912 Malignant neoplasm of unspecified site of left female breast: Secondary | ICD-10-CM

## 2017-01-12 MED ORDER — FILGRASTIM 480 MCG/0.8ML IJ SOSY: 480.0000 ug | PREFILLED_SYRINGE | Freq: Once | INTRAMUSCULAR | Status: DC

## 2017-01-12 MED ORDER — TBO-FILGRASTIM 480 MCG/0.8ML ~~LOC~~ SOSY
480.0000 ug | PREFILLED_SYRINGE | Freq: Once | SUBCUTANEOUS | Status: AC
  Administered 2017-01-12: 480 ug via SUBCUTANEOUS
  Filled 2017-01-12: qty 0.8

## 2017-01-13 ENCOUNTER — Other Ambulatory Visit (HOSPITAL_BASED_OUTPATIENT_CLINIC_OR_DEPARTMENT_OTHER): Payer: BLUE CROSS/BLUE SHIELD

## 2017-01-13 ENCOUNTER — Ambulatory Visit (HOSPITAL_COMMUNITY)
Admission: RE | Admit: 2017-01-13 | Discharge: 2017-01-13 | Disposition: A | Discharge status: Home / Self Care | Payer: BLUE CROSS/BLUE SHIELD | Source: Ambulatory Visit | Attending: Oncology | Admitting: Oncology

## 2017-01-13 ENCOUNTER — Telehealth: Payer: Self-pay

## 2017-01-13 ENCOUNTER — Encounter (HOSPITAL_COMMUNITY): Payer: Self-pay

## 2017-01-13 ENCOUNTER — Ambulatory Visit (HOSPITAL_BASED_OUTPATIENT_CLINIC_OR_DEPARTMENT_OTHER): Payer: BLUE CROSS/BLUE SHIELD

## 2017-01-13 ENCOUNTER — Ambulatory Visit: Payer: BLUE CROSS/BLUE SHIELD

## 2017-01-13 VITALS — BP 121/102 | HR 94 | Temp 97.9°F | Resp 16

## 2017-01-13 DIAGNOSIS — C7802 Secondary malignant neoplasm of left lung: Secondary | ICD-10-CM | POA: Diagnosis present

## 2017-01-13 DIAGNOSIS — C50912 Malignant neoplasm of unspecified site of left female breast: Secondary | ICD-10-CM

## 2017-01-13 DIAGNOSIS — C50212 Malignant neoplasm of upper-inner quadrant of left female breast: Secondary | ICD-10-CM

## 2017-01-13 DIAGNOSIS — C7951 Secondary malignant neoplasm of bone: Secondary | ICD-10-CM

## 2017-01-13 DIAGNOSIS — C50922 Malignant neoplasm of unspecified site of left male breast: Secondary | ICD-10-CM

## 2017-01-13 DIAGNOSIS — C7949 Secondary malignant neoplasm of other parts of nervous system: Secondary | ICD-10-CM

## 2017-01-13 DIAGNOSIS — C787 Secondary malignant neoplasm of liver and intrahepatic bile duct: Secondary | ICD-10-CM | POA: Diagnosis not present

## 2017-01-13 DIAGNOSIS — Z170 Estrogen receptor positive status [ER+]: Secondary | ICD-10-CM | POA: Insufficient documentation

## 2017-01-13 DIAGNOSIS — J90 Pleural effusion, not elsewhere classified: Secondary | ICD-10-CM | POA: Diagnosis not present

## 2017-01-13 DIAGNOSIS — R911 Solitary pulmonary nodule: Secondary | ICD-10-CM | POA: Insufficient documentation

## 2017-01-13 DIAGNOSIS — Z5189 Encounter for other specified aftercare: Secondary | ICD-10-CM | POA: Diagnosis not present

## 2017-01-13 DIAGNOSIS — C50919 Malignant neoplasm of unspecified site of unspecified female breast: Secondary | ICD-10-CM

## 2017-01-13 LAB — CBC WITH DIFFERENTIAL/PLATELET
BASO%: 0.5 % (ref 0.0–2.0)
Basophils Absolute: 0.1 10*3/uL (ref 0.0–0.1)
EOS%: 0.9 % (ref 0.0–7.0)
Eosinophils Absolute: 0.1 10*3/uL (ref 0.0–0.5)
HCT: 39.5 % (ref 34.8–46.6)
HGB: 12.3 g/dL (ref 11.6–15.9)
LYMPH#: 1.7 10*3/uL (ref 0.9–3.3)
LYMPH%: 11.8 % — AB (ref 14.0–49.7)
MCH: 31.5 pg (ref 25.1–34.0)
MCHC: 31.1 g/dL — AB (ref 31.5–36.0)
MCV: 101 fL (ref 79.5–101.0)
MONO#: 2.4 10*3/uL — AB (ref 0.1–0.9)
MONO%: 16.9 % — ABNORMAL HIGH (ref 0.0–14.0)
NEUT%: 69.9 % (ref 38.4–76.8)
NEUTROS ABS: 9.7 10*3/uL — AB (ref 1.5–6.5)
NRBC: 0 % (ref 0–0)
Platelets: 204 10*3/uL (ref 145–400)
RBC: 3.91 10*6/uL (ref 3.70–5.45)
RDW: 18.6 % — AB (ref 11.2–14.5)
WBC: 13.9 10*3/uL — AB (ref 3.9–10.3)

## 2017-01-13 LAB — COMPREHENSIVE METABOLIC PANEL
ALT: 31 U/L (ref 0–55)
AST: 75 U/L — AB (ref 5–34)
Albumin: 2.6 g/dL — ABNORMAL LOW (ref 3.5–5.0)
Alkaline Phosphatase: 255 U/L — ABNORMAL HIGH (ref 40–150)
Anion Gap: 10 mEq/L (ref 3–11)
BUN: 9.2 mg/dL (ref 7.0–26.0)
CALCIUM: 10.8 mg/dL — AB (ref 8.4–10.4)
CHLORIDE: 100 meq/L (ref 98–109)
CO2: 29 mEq/L (ref 22–29)
CREATININE: 0.9 mg/dL (ref 0.6–1.1)
EGFR: 78 mL/min/{1.73_m2} — ABNORMAL LOW (ref >90)
Glucose: 80 mg/dl (ref 70–140)
Potassium: 3.9 mEq/L (ref 3.5–5.1)
Sodium: 140 mEq/L (ref 136–145)
Total Bilirubin: 0.64 mg/dL (ref 0.20–1.20)
Total Protein: 6.2 g/dL — ABNORMAL LOW (ref 6.4–8.3)

## 2017-01-13 MED ORDER — IOPAMIDOL (ISOVUE-300) INJECTION 61%
INTRAVENOUS | Status: AC
  Filled 2017-01-13: qty 75

## 2017-01-13 MED ORDER — HEPARIN SOD (PORK) LOCK FLUSH 100 UNIT/ML IV SOLN
500.0000 [IU] | Freq: Once | INTRAVENOUS | Status: AC
  Administered 2017-01-13: 500 [IU] via INTRAVENOUS

## 2017-01-13 MED ORDER — SODIUM CHLORIDE 0.9% FLUSH
10.0000 mL | Freq: Once | INTRAVENOUS | Status: AC
Start: 2017-01-13 — End: 2017-01-13
  Administered 2017-01-13: 10 mL via INTRAVENOUS
  Filled 2017-01-13: qty 10

## 2017-01-13 MED ORDER — TBO-FILGRASTIM 480 MCG/0.8ML ~~LOC~~ SOSY
480.0000 ug | PREFILLED_SYRINGE | Freq: Once | SUBCUTANEOUS | Status: AC
  Administered 2017-01-13: 480 ug via SUBCUTANEOUS
  Filled 2017-01-13: qty 0.8

## 2017-01-13 MED ORDER — IOPAMIDOL (ISOVUE-300) INJECTION 61%
75.0000 mL | Freq: Once | INTRAVENOUS | Status: AC | PRN
  Administered 2017-01-13: 75 mL via INTRAVENOUS

## 2017-01-13 MED ORDER — HEPARIN SOD (PORK) LOCK FLUSH 100 UNIT/ML IV SOLN
INTRAVENOUS | Status: AC
Start: 2017-01-13 — End: 2017-01-13
  Filled 2017-01-13: qty 5

## 2017-01-13 MED ORDER — HEPARIN SOD (PORK) LOCK FLUSH 100 UNIT/ML IV SOLN
500.0000 [IU] | Freq: Once | INTRAVENOUS | Status: AC
  Administered 2017-01-13: 500 [IU] via INTRAVENOUS
  Filled 2017-01-13: qty 5

## 2017-01-13 NOTE — Patient Instructions (Signed)
Neutropenia Neutropenia is a condition that occurs when you have a lower-than-normal level of a type of white blood cell (neutrophil) in your body. Neutrophils are made in the spongy center of large bones (bone marrow) and they fight infections. Neutrophils are your body's main defense against bacterial and fungal infections. The fewer neutrophils you have and the longer your body remains without them, the greater your risk of getting a severe infection. What are the causes? This condition can occur if your body uses up or destroys neutrophils faster than your bone marrow can make them. This problem may happen because of:  Bacterial or fungal infection.  Allergic disorders.  Reactions to some medicines.  Autoimmune disease.  An enlarged spleen.  This condition can also occur if your bone marrow does not produce enough neutrophils. This problem may be caused by:  Cancer.  Cancer treatments, such as radiation or chemotherapy.  Viral infections.  Medicines, such as phenytoin.  Vitamin B12 deficiency.  Diseases of the bone marrow.  Environmental toxins, such as insecticides.  What are the signs or symptoms? This condition does not usually cause symptoms. If symptoms are present, they are usually caused by an underlying infection. Symptoms of an infection may include:  Fever.  Chills.  Swollen glands.  Oral or anal ulcers.  Cough and shortness of breath.  Rash.  Skin infection.  Fatigue.  How is this diagnosed? Your health care provider may suspect neutropenia if you have:  A condition that may cause neutropenia.  Symptoms of infection, especially fever.  Frequent and unusual infections.  You will have a medical history and physical exam. Tests will also be done, such as:  A complete blood count (CBC).  A procedure to collect a sample of bone marrow for examination (bone marrow biopsy).  A chest X-ray.  A urine culture.  A blood culture.  How is this  treated? Treatment depends on the underlying cause and severity of your condition. Mild neutropenia may not require treatment. Treatment may include medicines, such as:  Antibiotic medicine given through an IV tube.  Antiviral medicines.  Antifungal medicines.  A medicine to increase neutrophil production (colony-stimulating factor). You may get this drug through an IV tube or by injection.  Steroids given through an IV tube.  If an underlying condition is causing neutropenia, you may need treatment for that condition. If medicines you are taking are causing neutropenia, your health care provider may have you stop taking those medicines. Follow these instructions at home: Medicines  Take over-the-counter and prescription medicines only as told by your health care provider.  Get a seasonal flu shot (influenza vaccine). Lifestyle  Do not eat unpasteurized foods.Do not eat unwashed raw fruits or vegetables.  Avoid exposure to groups of people or children.  Avoid being around people who are sick.  Avoid being around dirt or dust, such as in construction areas or gardens.  Do not provide direct care for pets. Avoid animal droppings. Do not clean litter boxes and bird cages. Hygiene   Bathe daily.  Clean the area between the genitals and the anus (perineal area) after you urinate or have a bowel movement. If you are female, wipe from front to back.  Brush your teeth with a soft toothbrush before and after meals.  Do not use a razor that has a blade. Use an electric razor to remove hair.  Wash your hands often. Make sure others who come in contact with you also wash their hands. If soap and water  are not available, use hand sanitizer. General instructions  Do not have sex unless your health care provider has approved.  Take actions to avoid cuts and burns. For example: ? Be cautious when you use knives. Always cut away from yourself. ? Keep knives in protective sheaths or  guards when not in use. ? Use oven mitts when you cook with a hot stove, oven, or grill. ? Stand a safe distance away from open fires.  Avoid people who received a vaccine in the past 30 days if that vaccine contained a live version of the germ (live vaccine). You should not get a live vaccine. Common live vaccines are varicella, measles, mumps, and rubella.  Do not share food utensils.  Do not use tampons, enemas, or rectal suppositories unless your health care provider has approved.  Keep all appointments as told by your health care provider. This is important. Contact a health care provider if:  You have a fever.  You have chills or you start to shake.  You have: ? A sore throat. ? A warm, red, or tender area on your skin. ? A cough. ? Frequent or painful urination. ? Vaginal discharge or itching.  You develop: ? Sores in your mouth or anus. ? Swollen lymph nodes. ? Red streaks on the skin. ? A rash.  You feel: ? Nauseous or you vomit. ? Very fatigued. ? Short of breath. This information is not intended to replace advice given to you by your health care provider. Make sure you discuss any questions you have with your health care provider. Document Released: 12/05/2001 Document Revised: 11/21/2015 Document Reviewed: 12/26/2014 Elsevier Interactive Patient Education  2018 Elsevier Inc.  

## 2017-01-13 NOTE — Telephone Encounter (Signed)
See inbasket message.  Pt came in today for inj  But had port acc. From her ct scan.  Ok per Dr Jana Hakim Elmyra Ricks for her to have labs and flush today.  Patient was worked in  And advised that she may have a wait.Jill Johnston

## 2017-01-14 ENCOUNTER — Other Ambulatory Visit: Payer: BLUE CROSS/BLUE SHIELD

## 2017-01-14 ENCOUNTER — Ambulatory Visit: Payer: BLUE CROSS/BLUE SHIELD

## 2017-01-14 ENCOUNTER — Ambulatory Visit (HOSPITAL_BASED_OUTPATIENT_CLINIC_OR_DEPARTMENT_OTHER): Payer: BLUE CROSS/BLUE SHIELD | Admitting: Oncology

## 2017-01-14 VITALS — BP 110/85 | HR 100 | Temp 98.3°F | Resp 17 | Wt 125.3 lb

## 2017-01-14 DIAGNOSIS — C787 Secondary malignant neoplasm of liver and intrahepatic bile duct: Secondary | ICD-10-CM | POA: Diagnosis not present

## 2017-01-14 DIAGNOSIS — C50212 Malignant neoplasm of upper-inner quadrant of left female breast: Secondary | ICD-10-CM | POA: Diagnosis not present

## 2017-01-14 DIAGNOSIS — Z170 Estrogen receptor positive status [ER+]: Secondary | ICD-10-CM

## 2017-01-14 DIAGNOSIS — C7949 Secondary malignant neoplasm of other parts of nervous system: Secondary | ICD-10-CM | POA: Diagnosis not present

## 2017-01-14 DIAGNOSIS — C50912 Malignant neoplasm of unspecified site of left female breast: Secondary | ICD-10-CM

## 2017-01-14 DIAGNOSIS — C7802 Secondary malignant neoplasm of left lung: Secondary | ICD-10-CM

## 2017-01-14 DIAGNOSIS — C7951 Secondary malignant neoplasm of bone: Secondary | ICD-10-CM

## 2017-01-14 MED ORDER — OXYCODONE HCL ER 30 MG PO T12A: 30.0000 mg | EXTENDED_RELEASE_TABLET | Freq: Two times a day (BID) | ORAL | 0 refills | Status: DC

## 2017-01-14 MED ORDER — PROCHLORPERAZINE MALEATE 10 MG PO TABS: 10.0000 mg | ORAL_TABLET | Freq: Four times a day (QID) | ORAL | 1 refills | Status: DC | PRN

## 2017-01-14 MED ORDER — DEXAMETHASONE 4 MG PO TABS: 8.0000 mg | ORAL_TABLET | Freq: Every day | ORAL | 1 refills | Status: DC

## 2017-01-14 MED ORDER — OXYCODONE HCL 10 MG PO TABS: 10.0000 mg | ORAL_TABLET | ORAL | 0 refills | Status: DC | PRN

## 2017-01-14 NOTE — Progress Notes (Signed)
[facility]  [phone] Fax:(336) [phone]     ID: Jill Johnston DOB: 1966/03/30  MR#: [id number]  QPR#:[id number]  Patient Care Team: [name], MD as PCP - General (Oncology) [name], MD as Consulting Physician (Radiation Oncology) Muss, Demaris Callander as Consulting Physician (Internal Medicine) [name], MD as Consulting Physician (Internal Medicine) Clydell Hakim, MD as Consulting Physician (Anesthesiology) [name], MD OTHER MD:  CHIEF COMPLAINT: Estrogen receptor positive metastatic breast cancer  CURRENT TREATMENT: zolendronate, consider CMF   BREAST CANCER HISTORY: From Dr. 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 The breast cancer was ER + 100%, PR 1%, HER 2 IHC/FISH negative, Ki67 52%, BRCA 1/2 negative, initial CA 2729 WNL. She had neoadjuvant AC x4 taxol x12 dose dense from 06-10-2007 thru 09-28-2007 by Dr B.Darovsky. Surgery 10-27-2007 was left modified radical mastectomy with sentinel nodes I and II axillary dissection by Dr Ardean Larsen at [facility], [place] pMx, +LMI, grade 2/3, DCIS +, clinical stage IIB. She had radiation to chest wall, supraclavicular region and left axilla 50.4 Gy with boost to mastectomy scar to 60.4 Gy, by Dr [name] from 12-15-2007 thru 01-31-2008. She was on tamoxifen from 02-20-2008 thru 09-25-2014. She developed pain in ribs 08-2014, with CA 2729 52 and PET CT and MRI with diffuse bone metastatic disease, no cord compression, small lung lesions and intrathoracic adenopathy. 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 She began Denosumab 120 mcg monthly starting 4-7-201, and letrozole + palbociclib beginning4-12-2014. She was seen in consultation by Dr [name] [phone]. Palbociclib dose decreased to 100 mg daily x 21 q  28 days due to neutropenia. CBC on 10-25-15: WBC 2.1, Hgb 13, plt 186, ANC 0.9 PET 09-25-2014 in [facility] system and CT CAP at [facility] 10-25-15 with no clear pulmonary involvement, stable 2 cm lesion at dome of liver and 11 mm left hepatic lobe lesion. She had right tomo mamm[facility] at [facility] 09-17-15, with heterogeneously dense breast tissue but no other mammographic findings of concern.  CA 2729 increased from 38 in 08-2015 to 49 in April 2017 and 65 in May 2017. CT CAP / PET 03-16-16 showed progression lung, liver and bone. Letrozole and ibrance DCd on 03-19-16, with plans to begin chemotherapy. 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 She elected whole brain RT, given by Dr [name] ~ 03-23-16 thru 04-10-16.  She had first gemzar on 04-24-16  She had evaluation for vaginal bleeding "from polyp", follows yearly with gynecologist in [place], up to date."  Her subsequent history is as detailed below.  INTERVAL HISTORY: Jill Johnston returns today for follow-up of her metastatic estrogen receptor positive breast cancer accompanied by her husband Jill Johnston. Jill Johnston has been receiving gemcitabine for the past 7 months. Apart from hair loss and low counts she has generally tolerated it well. However we just obtained a restaging CT scan of the chest which shows clear progression in the liver. She is here today to discuss those results.  REVIEW OF SYSTEMS: Jill Johnston is losing weight. She is eating very little when she eats. She has nausea, but no vomiting. She says her sense of taste is pretty good. She is tired and is sleeping longer than she used to. Her pain is well-controlled on  her current narcotic dose, which has not changed, but she is constipated and does not take MiraLAX every day. She has not had a bowel movement in the last 2 days. She denies headaches, but has had problems with left  lateral vision particularly from the left eye for some weeks now. She denies dizziness or falls. A detailed review of systems today was otherwise stable  PAST MEDICAL HISTORY: Past Medical History:  Diagnosis Date  . Anxiety   . Breast cancer ([place])    Stage IV, ER positive left upper outer quadrant breast cancer with metastasis to bone  . Breast cancer metastasized to multiple sites ([place]) 04/12/2016   bone, liver, lung and brain  . GERD (gastroesophageal reflux disease)   . Hypertension     PAST SURGICAL HISTORY: Past Surgical History:  Procedure Laterality Date  . CHOLECYSTECTOMY    . ESOPHAGOGASTRODUODENOSCOPY (EGD) WITH PROPOFOL N/A 07/12/2015   Procedure: ESOPHAGOGASTRODUODENOSCOPY (EGD) WITH PROPOFOL;  Surgeon: Rogene Houston, MD;  Location[facility];  Service: Endoscopy;  Laterality: N/A;  1:00  . IR GENERIC HISTORICAL  04/22/2016   IR US GUIDE VASC ACCESS RIGHT 04/22/2016 Markus Daft, MD [facility]-INTER[facility] RAD  . IR GENERIC HISTORICAL  04/22/2016   IR FLUORO GUIDE PORT INSERTION RIGHT 04/22/2016 Markus Daft, MD [facility]  . MASTECTOMY  April 2009   Left breast with lymph node resection    FAMILY HISTORY Family History  Problem Relation Age of Onset  . Heart attack Mother        Died age 54  . Heart disease Father        Died age 84  . Brain cancer Father        pt not sure if he actually had this  . Cancer Maternal Grandmother        dx. NOS cancer at older age; d. 30  . Heart attack Maternal Grandfather 79  . Colon cancer Paternal Grandmother        d. 81s  The patient's father died at age 41 she believes from heart disease. The patient's mother died at age 17 also from a myocardial infarction. The patient had one brother, no sisters. There is no history of breast or ovarian cancer in the family despite extensive G neurologic research   GYNECOLOGIC HISTORY:  No LMP recorded. Patient is not currently having periods (Reason: Chemotherapy).  menarche age 70, first live  birth age 89, the patient is GX P1. She stopped having periods with chemotherapy in 2009 and these have not resumed.   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 They recently moved to [place] from [place]. Their son [name] is 13 years old as of November 2017 . The patient is [place] [employer]      ADVANCED DIRECTIVES:  the patient's husband is her healthcare power of attorney. There is no living will in place    HEALTH MAINTENANCE: Social History  Substance Use Topics  . Smoking status: Never Smoker  . Smokeless tobacco: Never Used  . Alcohol use No     Colonoscopy:  PAP:  Bone density:   No Known Allergies  Current Outpatient Prescriptions  Medication Sig Dispense Refill  . dexamethasone (DECADRON) 4 MG tablet Take 2 tablets (8 mg total) by mouth daily. Start the day after chemotherapy for 2 days. Take with food. 30 tablet 1  . docusate sodium (COLACE) 100  MG capsule Take 100 mg by mouth daily.     . hyaluronate sodium (RADIAPLEXRX) GEL Apply 1 application topically 2 (two) times daily.    Marland Kitchen HYDROcodone-acetaminophen (HYCET) 7.5-325 mg/15 ml solution Take 15 mLs by mouth 4 (four) times daily as needed for moderate pain. 120 mL 0  . ibuprofen (ADVIL,MOTRIN) 200 MG tablet Take 400 mg by mouth daily as needed.    . lidocaine-prilocaine (EMLA) cream Apply 1 application topically as needed. To port 1 hour before going to be accessed with needle. Cover with plastic wrap. 30 g 1  . lisinopril (PRINIVIL,ZESTRIL) 10 MG tablet Take 1 tablet (10 mg total) by mouth daily. (Patient not taking: Reported on 10/26/2016) 30 tablet 0  . oxyCODONE (OXYCONTIN) 30 MG 12 hr tablet Take 30 mg by mouth 2 (two) times daily. 60 each 0  . Oxycodone HCl 10 MG TABS Take 1 tablet (10 mg total) by mouth every 3 (three) hours as needed. 60 tablet 0  . pantoprazole (PROTONIX) 40 MG tablet TAKE 1 TABLET(40 MG) BY  MOUTH TWICE DAILY BEFORE A MEAL 60 tablet 5  . polyethylene glycol (MIRALAX / GLYCOLAX) packet Take 17 grams by mouth every day. 14 each 6  . prochlorperazine (COMPAZINE) 10 MG tablet Take 1 tablet (10 mg total) by mouth every 6 (six) hours as needed (Nausea or vomiting). 30 tablet 1   No current facility-administered medications for this visit.     OBJECTIVE: Middle-aged white woman originally from [place]  Vitals:   01/14/17 1235  BP: 110/85  Pulse: 100  Resp: 17  Temp: 98.3 F (36.8 C)     Body mass index is 20.85 kg/m.    ECOG FS:1 - Symptomatic but completely ambulatory Filed Weights   01/14/17 1235  Weight: 125 lb 5 oz (56.8 kg)   Sclerae unicteric, pupils round and equal, full EOMs Oropharynx clear, slightly dry No cervical or supraclavicular adenopathy Lungs no rales or rhonchi Heart regular rate and rhythm Abd soft, nontender, positive bowel sounds MSK no focal spinal tenderness Neuro: nonfocal, well oriented, tearful affect Breasts: Deferred    LAB RESULTS:  CMP     Component Value Date/Time   NA 140 01/13/2017 1001   K 3.9 01/13/2017 1001   CL 105 05/20/2016 0930   CO2 29 01/13/2017 1001   GLUCOSE 80 01/13/2017 1001   BUN 9.2 01/13/2017 1001   CREATININE 0.9 01/13/2017 1001   CALCIUM 10.8 (H) 01/13/2017 1001   PROT 6.2 (L) 01/13/2017 1001   ALBUMIN 2.6 (L) 01/13/2017 1001   AST 75 (H) 01/13/2017 1001   ALT 31 01/13/2017 1001   ALKPHOS 255 (H) 01/13/2017 1001   BILITOT 0.64 01/13/2017 1001   GFRNONAA >60 05/20/2016 0930   GFRAA >60 05/20/2016 0930    INo results found for: SPEP, UPEP  Lab Results  Component Value Date   WBC 13.9 (H) 01/13/2017   NEUTROABS 9.7 (H) 01/13/2017   HGB 12.3 01/13/2017   HCT 39.5 01/13/2017   MCV 101.0 01/13/2017   PLT 204 01/13/2017      Chemistry      Component Value Date/Time   NA 140 01/13/2017 1001   K 3.9 01/13/2017 1001   CL 105 05/20/2016 0930   CO2 29 01/13/2017 1001   BUN 9.2 01/13/2017 1001    CREATININE 0.9 01/13/2017 1001      Component Value Date/Time   CALCIUM 10.8 (H) 01/13/2017 1001   ALKPHOS 255 (H) 01/13/2017 1001   AST 75 (H) 01/13/2017  1001   ALT 31 01/13/2017 1001   BILITOT 0.64 01/13/2017 1001       Lab Results  Component Value Date   LABCA2 98 (H) 02/27/2016    No components found for: EXHBZ169  No results for input(s): INR in the last 168 hours.  Urinalysis    Component Value Date/Time   COLORURINE ORANGE (A) 05/17/2016 0852   APPEARANCEUR CLOUDY (A) 05/17/2016 0852   LABSPEC 1.027 05/17/2016 0852   PHURINE 6.0 05/17/2016 0852   GLUCOSEU NEGATIVE 05/17/2016 0852   HGBUR NEGATIVE 05/17/2016 0852   BILIRUBINUR SMALL (A) 05/17/2016 0852   KETONESUR NEGATIVE 05/17/2016 0852   PROTEINUR 30 (A) 05/17/2016 0852   NITRITE NEGATIVE 05/17/2016 0852   LEUKOCYTESUR TRACE (A) 05/17/2016 0852     STUDIES: Ct Chest W Contrast  Result Date: 01/13/2017 CLINICAL DATA:  Metastatic breast cancer undergoing chemotherapy. History of hepatic and osseous metastasis. EXAM: CT CHEST WITH CONTRAST TECHNIQUE: Multidetector CT imaging of the chest was performed during intravenous contrast administration. CONTRAST:  34m ISOVUE-300 IOPAMIDOL (ISOVUE-300) INJECTION 61% COMPARISON:  Multiple prior CT scans.  The most recent is 10/15/2016 FINDINGS: Cardiovascular: The heart is normal in size. No pericardial effusion. The aorta is normal in caliber. No dissection. The branch vessels are patent. Suspect small scattered coronary artery calcifications. Mediastinum/Nodes: No mediastinal or hilar mass or lymphadenopathy. Small scattered lymph nodes are noted. Lungs/Pleura: Stable radiation changes involving the left anterior lung. Stable 4.5 mm right apical lung nodule. Stable right middle lobe atelectasis and/or scarring. There are new small bilateral pleural effusions without definite enhancing pleural nodules. Overlying atelectasis is noted. Upper Abdomen: Diffuse hepatic metastatic  disease appears progressive since prior study. Several new lesions are noted at the hepatic dome. Segment 7 lesion previously measuring a maximum of 3.3 cm now measures 5.5 cm. No adrenal gland metastasis.  No upper abdominal lymphadenopathy. Chest wall/ Musculoskeletal: Stable surgical changes from a left mastectomy. No definite findings for chest wall recurrence. No axillary or supraclavicular adenopathy. The thyroid gland appears normal. Diffuse osseous metastatic disease progressive since prior study. No pathologic fracture or canal compromise. IMPRESSION: 1. Progressive hepatic metastatic disease and osseous metastatic disease. 2. Stable 4.5 mm right upper lobe pulmonary nodule. No new pulmonary lesions. 3. New small bilateral pleural effusions without definite enhancing pleural nodules. Electronically Signed   By: PMarijo SanesM.D.   On: 01/13/2017 13:09    ELIGIBLE FOR AVAILABLE RESEARCH PROTOCOL: no  ASSESSMENT: 51y.o. BRCA negative [place] ([place] status post left breast upper inner quadrant biopsy 05/17/2007 for a clinical mT2 N2, stage IIIA invasive ductal breast cancer, strongly estrogen receptor positive, weakly progesterone receptor positive, HER-2 negative, with an MIB-1 of 52%  (2) neoadjuvant chemotherapy consisted of dose dense doxorubicin and cyclophosphamide 4 followed by weekly paclitaxel 12, started 06/10/2007, completed 09/28/2007  (3) status post left [phone] removed that invasive ductal carcinoma, grade 2, pT2, pN1a (downstaged to IIB), estrogen receptor strongly positive, progesterone receptor weakly positive, HER-2 not amplified by immunohistochemistry or FISH with a signals ratio of 0.94, number per cell 3.4  (4) status post adjuvant radiation to the left chest wall as well as the left supraclavicular and axillary nodal regions (50.4 gray +10 Gray boost) given between 12/15/2007 on  01/31/2008  (5) on adjuvant tamoxifen 02/20/2008 through March 2016  METASTATIC DISEASE: March 2016, presenting with bone pain (6) staging studies April 2016 showed diffuse bony metastatic disease, intrathoracic adenopathy, and  small lung lesions.  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, estrogen receptor strongly positive, progesterone receptor and HER-2 negative  (b) CA-27-29 is informative  (7) denosumab/Xgeva started April 2016, discontinued May 2018  (a) zolendronate started  11/06/2016  (8) letrozole/palbociclib started April 2016,  discontinued September 2017 with progression  (a) restaging studies September 2017 document bone, lung, and liver involvement  (b) brain MRI September 2017 c/w leptomeningeal spread, +/- parietal metastases  (9) whole brain irradiation 03/30/16 - 04/10/16 Site/dose:   The whole brain was treated to 30 Gy in 10 fractions of 3 Gy.  (a) brain MRI 07/16/2016 interpreted as requiring only further follow-up  (b) brain MRI 10/22/2016 read as stable  (10) repeat genetic testing 04/23/2016 through the [facility] Hereditary Cancer Panel offered by [facility] found no deleterious mutations in APC, ATM, BARD1, BMPR1A, BRCA1, BRCA2, BRIP1, CHD1, CDK4, CDKN2A, CHEK2, EPCAM (large rearrangement only), GREM1/SCG5, MLH1, MSH2, MSH6, MUTYH, NBN, PALB2, PMS2, POLD1, POLE, PTEN, RAD51C, RAD51D, SMAD4, STK11, and TP53  (11) gemcitabine started 04/24/2016,  given days 1 and 8 of each 21 day cycle  (a) day 8 cycle 1 delayed because of cytopenias--dose reduced and neupogen added; refuses neulasta/onpro  (b) CT scan of the chest 07/16/2016 shows decrease in the size of liver metastases.  (c) chest CT 10/15/2016 shows liver metastases to be stable, no lung metastases; new sternal mets?  (d) cycle 10 delayed 4 weeks because of intervening radiation  (e) cycle 11 delayed one week because of cytopenias  (f) CT scan of the chest  01/13/2017 shows disease progression in the liver--gemcitabine stopped  (12) Radiation treatment dates:   11/18/16 - 12/02/16 Site/dose:   c-spine treated to 30 Gy with 10 fractions of 3 Gy  (13) to start cyclophosphamide/ methotrexate/ fluorouracil 01/15/2017  (14) BRCA probe sent 01/14/2017  PLAN: I spent approximately 40 minutes today with Latanya Presser and her husband Jill Johnston going over her situation. I gave him a copy of the CT scan which shows progression in the liver. The lungs however are stable.  We reviewed the fact that stage IV disease is not curable and therefore whatever treatment we try, even if it worked initially, will eventually stop working as is the case now.  When we get to this point we always check the question whether the patient wishes to continue treatment despite the fact that there will be significant side effects versus switching over to a comfort/palliative care approach. Rosaria Ferries is very clear that she wants to continue treatment so long as there is any possibility of response because of her child and family.  Accordingly we discussed cyclophosphamide, methotrexate, and fluorouracil (CMF). We discussed the possible toxicities side effects and complications of these agents. She understands she would be treated every 3 weeks, and it would be very convenient if she would agree to try OnPro, which means they would have a lot fewer trips to the cancer center. Getting here is difficult because of course Jill Johnston is still working full time.  We also discussed the fact that when there is uncontrolled cancer in the liver then loss of appetite, altered taste, and nausea are going to be there. The way to deal with is to eat many small meals, and to try to eat high caloric foods. I also urged her to keep herself well-hydrated.  She is already scheduled for repeat brain MRI next week. If that shows disease progression and she needs further radiation we will have to hold the chemotherapy  or at  least omit the methotrexate from her treatments.  Note that they have a trip to [facility] already scheduled for the week of August 17 which is when her second cycle of CMF would be due if all goes according to plan. [name], MD   01/14/2017 1:51 PM Medical Oncology and Hematology [facility] [address] [place], [place] [address] Tel. [phone]    Fax. [phone]

## 2017-01-14 NOTE — Progress Notes (Signed)
DISCONTINUE OFF PATHWAY REGIMEN - Breast   [id number]:Gemcitabine 1,000 mg/m2 D1, 8  q21 Days:   A cycle is every 21 days:     Gemcitabine        Dose Mod: None  **Always confirm dose/schedule in your pharmacy ordering system**    REASON: Disease Progression PRIOR TREATMENT: Off Pathway: Gemcitabine 1,000 mg/m2 D1, 8  q21 Days TREATMENT RESPONSE: Progressive Disease (PD)  START OFF PATHWAY REGIMEN - Breast   [id number]:CMF (IV cyclophosphamide) q21 days:   A cycle is every 21 days:     Cyclophosphamide      Methotrexate      5-Fluorouracil   **Always confirm dose/schedule in your pharmacy ordering system**    Patient Characteristics: Metastatic Chemotherapy, HER2 Negative/Unknown/Equivocal, ER Positive, Second Line, Prior Paclitaxel Therapeutic Status: Distant Metastases BRCA Mutation Status: Awaiting Test Results ER Status: Positive (+) HER2 Status: Negative (-) Would you be surprised if this patient died  in the next year? I would NOT be surprised if this patient died in the next year PR Status: Positive (+) Line of therapy: Second Line Intent of Therapy: Non-Curative / Palliative Intent, Discussed with Patient 

## 2017-01-15 ENCOUNTER — Ambulatory Visit: Payer: BLUE CROSS/BLUE SHIELD

## 2017-01-15 ENCOUNTER — Other Ambulatory Visit: Payer: BLUE CROSS/BLUE SHIELD

## 2017-01-15 ENCOUNTER — Ambulatory Visit: Payer: Self-pay | Admitting: Urology

## 2017-01-18 ENCOUNTER — Ambulatory Visit
Admission: RE | Admit: 2017-01-18 | Discharge: 2017-01-18 | Disposition: A | Discharge status: Home / Self Care | Payer: BLUE CROSS/BLUE SHIELD | Source: Ambulatory Visit | Attending: Radiation Oncology | Admitting: Radiation Oncology

## 2017-01-18 ENCOUNTER — Encounter: Payer: Self-pay | Admitting: Urology

## 2017-01-18 DIAGNOSIS — C7931 Secondary malignant neoplasm of brain: Secondary | ICD-10-CM

## 2017-01-18 DIAGNOSIS — C7949 Secondary malignant neoplasm of other parts of nervous system: Principal | ICD-10-CM

## 2017-01-18 MED ORDER — GADOBENATE DIMEGLUMINE 529 MG/ML IV SOLN
13.0000 mL | Freq: Once | INTRAVENOUS | Status: AC | PRN
  Administered 2017-01-18: 13 mL via INTRAVENOUS

## 2017-01-20 ENCOUNTER — Emergency Department (HOSPITAL_COMMUNITY): Payer: BLUE CROSS/BLUE SHIELD

## 2017-01-20 ENCOUNTER — Ambulatory Visit
Admission: RE | Admit: 2017-01-20 | Discharge: 2017-01-20 | Disposition: A | Discharge status: Home / Self Care | Payer: BLUE CROSS/BLUE SHIELD | Source: Ambulatory Visit | Attending: Urology | Admitting: Urology

## 2017-01-20 ENCOUNTER — Inpatient Hospital Stay (HOSPITAL_COMMUNITY): Payer: BLUE CROSS/BLUE SHIELD

## 2017-01-20 ENCOUNTER — Inpatient Hospital Stay (HOSPITAL_COMMUNITY)
Admission: EM | Admit: 2017-01-20 | Discharge: 2017-01-28 | DRG: 871 | Disposition: A | Discharge status: Hospice | Payer: BLUE CROSS/BLUE SHIELD | Attending: Internal Medicine | Admitting: Internal Medicine

## 2017-01-20 ENCOUNTER — Encounter (HOSPITAL_COMMUNITY): Payer: Self-pay | Admitting: Emergency Medicine

## 2017-01-20 ENCOUNTER — Other Ambulatory Visit: Payer: Self-pay | Admitting: Oncology

## 2017-01-20 DIAGNOSIS — C7931 Secondary malignant neoplasm of brain: Secondary | ICD-10-CM | POA: Diagnosis present

## 2017-01-20 DIAGNOSIS — C7800 Secondary malignant neoplasm of unspecified lung: Secondary | ICD-10-CM | POA: Diagnosis present

## 2017-01-20 DIAGNOSIS — E870 Hyperosmolality and hypernatremia: Secondary | ICD-10-CM | POA: Diagnosis not present

## 2017-01-20 DIAGNOSIS — Z170 Estrogen receptor positive status [ER+]: Secondary | ICD-10-CM

## 2017-01-20 DIAGNOSIS — E43 Unspecified severe protein-calorie malnutrition: Secondary | ICD-10-CM | POA: Diagnosis present

## 2017-01-20 DIAGNOSIS — N179 Acute kidney failure, unspecified: Secondary | ICD-10-CM | POA: Diagnosis present

## 2017-01-20 DIAGNOSIS — C50412 Malignant neoplasm of upper-outer quadrant of left female breast: Secondary | ICD-10-CM | POA: Diagnosis present

## 2017-01-20 DIAGNOSIS — F419 Anxiety disorder, unspecified: Secondary | ICD-10-CM | POA: Diagnosis present

## 2017-01-20 DIAGNOSIS — Z515 Encounter for palliative care: Secondary | ICD-10-CM | POA: Diagnosis not present

## 2017-01-20 DIAGNOSIS — A419 Sepsis, unspecified organism: Principal | ICD-10-CM | POA: Diagnosis present

## 2017-01-20 DIAGNOSIS — R0902 Hypoxemia: Secondary | ICD-10-CM | POA: Diagnosis present

## 2017-01-20 DIAGNOSIS — Z7189 Other specified counseling: Secondary | ICD-10-CM | POA: Diagnosis not present

## 2017-01-20 DIAGNOSIS — C7949 Secondary malignant neoplasm of other parts of nervous system: Secondary | ICD-10-CM | POA: Diagnosis not present

## 2017-01-20 DIAGNOSIS — C7951 Secondary malignant neoplasm of bone: Secondary | ICD-10-CM

## 2017-01-20 DIAGNOSIS — K5900 Constipation, unspecified: Secondary | ICD-10-CM | POA: Diagnosis present

## 2017-01-20 DIAGNOSIS — R0603 Acute respiratory distress: Secondary | ICD-10-CM | POA: Diagnosis present

## 2017-01-20 DIAGNOSIS — Z66 Do not resuscitate: Secondary | ICD-10-CM | POA: Diagnosis present

## 2017-01-20 DIAGNOSIS — C50912 Malignant neoplasm of unspecified site of left female breast: Secondary | ICD-10-CM | POA: Diagnosis not present

## 2017-01-20 DIAGNOSIS — E875 Hyperkalemia: Secondary | ICD-10-CM | POA: Diagnosis not present

## 2017-01-20 DIAGNOSIS — Z923 Personal history of irradiation: Secondary | ICD-10-CM

## 2017-01-20 DIAGNOSIS — C799 Secondary malignant neoplasm of unspecified site: Secondary | ICD-10-CM

## 2017-01-20 DIAGNOSIS — G9341 Metabolic encephalopathy: Secondary | ICD-10-CM | POA: Diagnosis present

## 2017-01-20 DIAGNOSIS — E876 Hypokalemia: Secondary | ICD-10-CM | POA: Diagnosis present

## 2017-01-20 DIAGNOSIS — C50212 Malignant neoplasm of upper-inner quadrant of left female breast: Secondary | ICD-10-CM | POA: Diagnosis not present

## 2017-01-20 DIAGNOSIS — C787 Secondary malignant neoplasm of liver and intrahepatic bile duct: Secondary | ICD-10-CM

## 2017-01-20 DIAGNOSIS — Z95828 Presence of other vascular implants and grafts: Secondary | ICD-10-CM

## 2017-01-20 DIAGNOSIS — R410 Disorientation, unspecified: Secondary | ICD-10-CM | POA: Diagnosis not present

## 2017-01-20 DIAGNOSIS — K219 Gastro-esophageal reflux disease without esophagitis: Secondary | ICD-10-CM | POA: Diagnosis present

## 2017-01-20 DIAGNOSIS — B370 Candidal stomatitis: Secondary | ICD-10-CM | POA: Diagnosis present

## 2017-01-20 DIAGNOSIS — I10 Essential (primary) hypertension: Secondary | ICD-10-CM | POA: Diagnosis present

## 2017-01-20 DIAGNOSIS — G893 Neoplasm related pain (acute) (chronic): Secondary | ICD-10-CM | POA: Diagnosis present

## 2017-01-20 DIAGNOSIS — E860 Dehydration: Secondary | ICD-10-CM | POA: Diagnosis present

## 2017-01-20 DIAGNOSIS — R4182 Altered mental status, unspecified: Secondary | ICD-10-CM | POA: Diagnosis not present

## 2017-01-20 DIAGNOSIS — Z6821 Body mass index (BMI) 21.0-21.9, adult: Secondary | ICD-10-CM | POA: Diagnosis not present

## 2017-01-20 DIAGNOSIS — R000 Tachycardia, unspecified: Secondary | ICD-10-CM | POA: Diagnosis not present

## 2017-01-20 DIAGNOSIS — G9340 Encephalopathy, unspecified: Secondary | ICD-10-CM | POA: Diagnosis not present

## 2017-01-20 DIAGNOSIS — Y95 Nosocomial condition: Secondary | ICD-10-CM | POA: Diagnosis present

## 2017-01-20 DIAGNOSIS — Z79899 Other long term (current) drug therapy: Secondary | ICD-10-CM

## 2017-01-20 DIAGNOSIS — J189 Pneumonia, unspecified organism: Secondary | ICD-10-CM | POA: Diagnosis present

## 2017-01-20 DIAGNOSIS — Z79891 Long term (current) use of opiate analgesic: Secondary | ICD-10-CM

## 2017-01-20 DIAGNOSIS — Z9012 Acquired absence of left breast and nipple: Secondary | ICD-10-CM

## 2017-01-20 HISTORY — DX: Personal history of irradiation: Z92.3

## 2017-01-20 LAB — CBC
HEMATOCRIT: 41.5 % (ref 36.0–46.0)
Hemoglobin: 13.3 g/dL (ref 12.0–15.0)
MCH: 31.6 pg (ref 26.0–34.0)
MCHC: 32 g/dL (ref 30.0–36.0)
MCV: 98.6 fL (ref 78.0–100.0)
Platelets: 121 10*3/uL — ABNORMAL LOW (ref 150–400)
RBC: 4.21 MIL/uL (ref 3.87–5.11)
RDW: 18 % — AB (ref 11.5–15.5)
WBC: 7.6 10*3/uL (ref 4.0–10.5)

## 2017-01-20 LAB — COMPREHENSIVE METABOLIC PANEL
ALBUMIN: 2.8 g/dL — AB (ref 3.5–5.0)
ALT: 49 U/L (ref 14–54)
AST: 134 U/L — AB (ref 15–41)
Alkaline Phosphatase: 221 U/L — ABNORMAL HIGH (ref 38–126)
Anion gap: 10 (ref 5–15)
BILIRUBIN TOTAL: 1.9 mg/dL — AB (ref 0.3–1.2)
BUN: 36 mg/dL — AB (ref 6–20)
CO2: 36 mmol/L — AB (ref 22–32)
Calcium: >15 mg/dL (ref 8.9–10.3)
Chloride: 95 mmol/L — ABNORMAL LOW (ref 101–111)
Creatinine, Ser: 1.7 mg/dL — ABNORMAL HIGH (ref 0.44–1.00)
GFR calc Af Amer: 39 mL/min — ABNORMAL LOW (ref >60)
GFR calc non Af Amer: 34 mL/min — ABNORMAL LOW (ref >60)
GLUCOSE: 93 mg/dL (ref 65–99)
POTASSIUM: 3.1 mmol/L — AB (ref 3.5–5.1)
SODIUM: 141 mmol/L (ref 135–145)
TOTAL PROTEIN: 6.9 g/dL (ref 6.5–8.1)

## 2017-01-20 LAB — I-STAT CG4 LACTIC ACID, ED: LACTIC ACID, VENOUS: 3.15 mmol/L — AB (ref 0.5–1.9)

## 2017-01-20 LAB — URINALYSIS, ROUTINE W REFLEX MICROSCOPIC
BILIRUBIN URINE: NEGATIVE
Glucose, UA: NEGATIVE mg/dL
KETONES UR: 5 mg/dL — AB
LEUKOCYTES UA: NEGATIVE
Nitrite: NEGATIVE
PROTEIN: NEGATIVE mg/dL
Specific Gravity, Urine: 1.018 (ref 1.005–1.030)
pH: 5 (ref 5.0–8.0)

## 2017-01-20 LAB — LACTIC ACID, PLASMA
Lactic Acid, Venous: 2.9 mmol/L (ref 0.5–1.9)
Lactic Acid, Venous: 2.1 mmol/L (ref 0.5–1.9)

## 2017-01-20 LAB — BILIRUBIN, DIRECT: BILIRUBIN DIRECT: 0.5 mg/dL (ref 0.1–0.5)

## 2017-01-20 LAB — MRSA PCR SCREENING: MRSA BY PCR: NEGATIVE

## 2017-01-20 LAB — AMMONIA: AMMONIA: 38 umol/L — AB (ref 9–35)

## 2017-01-20 LAB — CBG MONITORING, ED: Glucose-Capillary: 98 mg/dL (ref 65–99)

## 2017-01-20 MED ORDER — CALCITONIN (SALMON) 200 UNIT/ML IJ SOLN
200.0000 [IU] | Freq: Two times a day (BID) | INTRAMUSCULAR | Status: AC
  Administered 2017-01-20 – 2017-01-21 (×4): 200 [IU] via INTRAMUSCULAR
  Filled 2017-01-20 (×6): qty 1

## 2017-01-20 MED ORDER — SODIUM CHLORIDE 0.9 % IV SOLN
INTRAVENOUS | Status: DC
  Administered 2017-01-20: 1000 mL via INTRAVENOUS

## 2017-01-20 MED ORDER — SODIUM CHLORIDE 0.9 % IV BOLUS (SEPSIS)
500.0000 mL | Freq: Once | INTRAVENOUS | Status: AC
  Administered 2017-01-20: 500 mL via INTRAVENOUS

## 2017-01-20 MED ORDER — SENNA 8.6 MG PO TABS
1.0000 | ORAL_TABLET | Freq: Two times a day (BID) | ORAL | Status: DC
Start: 2017-01-20 — End: 2017-01-28
  Filled 2017-01-20 (×3): qty 1

## 2017-01-20 MED ORDER — DEXTROSE 5 % IV SOLN
2.0000 g | Freq: Once | INTRAVENOUS | Status: AC
  Administered 2017-01-20: 2 g via INTRAVENOUS
  Filled 2017-01-20: qty 2

## 2017-01-20 MED ORDER — SODIUM CHLORIDE 0.9 % IV SOLN: INTRAVENOUS | Status: DC

## 2017-01-20 MED ORDER — ENOXAPARIN SODIUM 40 MG/0.4ML ~~LOC~~ SOLN
40.0000 mg | SUBCUTANEOUS | Status: DC
  Administered 2017-01-20 – 2017-01-26 (×7): 40 mg via SUBCUTANEOUS
  Filled 2017-01-20 (×9): qty 0.4

## 2017-01-20 MED ORDER — SODIUM CHLORIDE 0.9 % IV SOLN: Freq: Once | INTRAVENOUS | Status: DC

## 2017-01-20 MED ORDER — ENOXAPARIN SODIUM 30 MG/0.3ML ~~LOC~~ SOLN: 30.0000 mg | SUBCUTANEOUS | Status: DC

## 2017-01-20 MED ORDER — LACTULOSE 10 GM/15ML PO SOLN
30.0000 g | Freq: Every day | ORAL | Status: DC
  Filled 2017-01-20: qty 45

## 2017-01-20 MED ORDER — HYDROMORPHONE HCL-NACL 0.5-0.9 MG/ML-% IV SOSY
0.5000 mg | PREFILLED_SYRINGE | INTRAVENOUS | Status: DC | PRN
  Administered 2017-01-20: 0.5 mg via INTRAVENOUS
  Filled 2017-01-20: qty 1

## 2017-01-20 MED ORDER — DEXTROSE 5 % IV SOLN
2.0000 g | INTRAVENOUS | Status: DC
  Administered 2017-01-21 – 2017-01-22 (×2): 2 g via INTRAVENOUS
  Filled 2017-01-20 (×2): qty 2

## 2017-01-20 MED ORDER — LACTATED RINGERS IV SOLN: INTRAVENOUS | Status: DC

## 2017-01-20 MED ORDER — POTASSIUM CHLORIDE CRYS ER 20 MEQ PO TBCR: 20.0000 meq | EXTENDED_RELEASE_TABLET | Freq: Once | ORAL | Status: DC

## 2017-01-20 MED ORDER — SODIUM CHLORIDE 0.9 % IV BOLUS (SEPSIS): 1000.0000 mL | Freq: Once | INTRAVENOUS | Status: DC

## 2017-01-20 MED ORDER — HYDRALAZINE HCL 20 MG/ML IJ SOLN
10.0000 mg | Freq: Four times a day (QID) | INTRAMUSCULAR | Status: DC | PRN
  Filled 2017-01-20: qty 0.5
  Filled 2017-01-20: qty 1

## 2017-01-20 MED ORDER — FENTANYL 12 MCG/HR TD PT72
12.5000 ug | MEDICATED_PATCH | TRANSDERMAL | Status: DC
Start: 2017-01-20 — End: 2017-01-21
  Administered 2017-01-20: 12.5 ug via TRANSDERMAL
  Filled 2017-01-20: qty 1

## 2017-01-20 MED ORDER — ONDANSETRON HCL 4 MG/2ML IJ SOLN: 4.0000 mg | Freq: Four times a day (QID) | INTRAMUSCULAR | Status: DC | PRN

## 2017-01-20 MED ORDER — PANTOPRAZOLE SODIUM 40 MG PO TBEC: 40.0000 mg | DELAYED_RELEASE_TABLET | Freq: Every day | ORAL | Status: DC

## 2017-01-20 MED ORDER — OXYCODONE HCL 5 MG PO TABS: 5.0000 mg | ORAL_TABLET | ORAL | Status: DC | PRN

## 2017-01-20 MED ORDER — ONDANSETRON HCL 4 MG PO TABS: 4.0000 mg | ORAL_TABLET | Freq: Four times a day (QID) | ORAL | Status: DC | PRN

## 2017-01-20 MED ORDER — LORAZEPAM 2 MG/ML IJ SOLN
0.5000 mg | Freq: Four times a day (QID) | INTRAMUSCULAR | Status: DC | PRN
  Administered 2017-01-20 – 2017-01-22 (×3): 0.5 mg via INTRAVENOUS
  Filled 2017-01-20 (×4): qty 1

## 2017-01-20 MED ORDER — DEXAMETHASONE SODIUM PHOSPHATE 10 MG/ML IJ SOLN
10.0000 mg | Freq: Two times a day (BID) | INTRAMUSCULAR | Status: DC
  Administered 2017-01-21 – 2017-01-27 (×15): 10 mg via INTRAVENOUS
  Filled 2017-01-20 (×14): qty 1

## 2017-01-20 MED ORDER — ACETAMINOPHEN 325 MG PO TABS: 650.0000 mg | ORAL_TABLET | Freq: Four times a day (QID) | ORAL | Status: DC | PRN

## 2017-01-20 MED ORDER — PROCHLORPERAZINE MALEATE 10 MG PO TABS: 10.0000 mg | ORAL_TABLET | Freq: Four times a day (QID) | ORAL | Status: DC | PRN

## 2017-01-20 MED ORDER — SODIUM CHLORIDE 0.9 % IV BOLUS (SEPSIS)
1000.0000 mL | Freq: Once | INTRAVENOUS | Status: AC
  Administered 2017-01-20: 1000 mL via INTRAVENOUS

## 2017-01-20 MED ORDER — BISACODYL 5 MG PO TBEC: 5.0000 mg | DELAYED_RELEASE_TABLET | Freq: Every day | ORAL | Status: DC | PRN

## 2017-01-20 MED ORDER — VANCOMYCIN HCL IN DEXTROSE 750-5 MG/150ML-% IV SOLN
750.0000 mg | INTRAVENOUS | Status: DC
  Administered 2017-01-20: 750 mg via INTRAVENOUS
  Filled 2017-01-20 (×2): qty 150

## 2017-01-20 MED ORDER — ACETAMINOPHEN 650 MG RE SUPP: 650.0000 mg | Freq: Four times a day (QID) | RECTAL | Status: DC | PRN

## 2017-01-20 MED ORDER — HYDROMORPHONE HCL-NACL 0.5-0.9 MG/ML-% IV SOSY
0.5000 mg | PREFILLED_SYRINGE | INTRAVENOUS | Status: DC | PRN
Start: 2017-01-20 — End: 2017-01-21
  Administered 2017-01-21 (×4): 0.5 mg via INTRAVENOUS
  Filled 2017-01-20 (×4): qty 1

## 2017-01-20 MED ORDER — POTASSIUM CHLORIDE 10 MEQ/100ML IV SOLN
10.0000 meq | INTRAVENOUS | Status: AC
  Administered 2017-01-20 (×2): 10 meq via INTRAVENOUS

## 2017-01-20 MED ORDER — POTASSIUM CHLORIDE 10 MEQ/100ML IV SOLN
INTRAVENOUS | Status: AC
  Administered 2017-01-20: 19:00:00
  Filled 2017-01-20: qty 200

## 2017-01-20 MED ORDER — PROMETHAZINE HCL 25 MG/ML IJ SOLN: 12.5000 mg | Freq: Four times a day (QID) | INTRAMUSCULAR | Status: DC | PRN

## 2017-01-20 MED ORDER — PANTOPRAZOLE SODIUM 40 MG IV SOLR
40.0000 mg | Freq: Every day | INTRAVENOUS | Status: DC
  Administered 2017-01-20 – 2017-01-27 (×8): 40 mg via INTRAVENOUS
  Filled 2017-01-20 (×8): qty 40

## 2017-01-20 MED ORDER — OXYCODONE HCL ER 10 MG PO T12A
30.0000 mg | EXTENDED_RELEASE_TABLET | Freq: Two times a day (BID) | ORAL | Status: DC
  Filled 2017-01-20: qty 3

## 2017-01-20 MED ORDER — POLYETHYLENE GLYCOL 3350 17 G PO PACK
17.0000 g | PACK | Freq: Every day | ORAL | Status: DC
  Filled 2017-01-20: qty 1

## 2017-01-20 MED ORDER — ZOLEDRONIC ACID 4 MG/5ML IV CONC
4.0000 mg | Freq: Once | INTRAVENOUS | Status: AC
  Administered 2017-01-20: 4 mg via INTRAVENOUS
  Filled 2017-01-20: qty 5

## 2017-01-20 MED ORDER — DEXAMETHASONE SODIUM PHOSPHATE 10 MG/ML IJ SOLN
10.0000 mg | Freq: Once | INTRAMUSCULAR | Status: AC
  Administered 2017-01-20: 10 mg via INTRAVENOUS
  Filled 2017-01-20: qty 1

## 2017-01-20 MED ORDER — LEVETIRACETAM 500 MG/5ML IV SOLN
500.0000 mg | Freq: Two times a day (BID) | INTRAVENOUS | Status: DC
  Administered 2017-01-20 – 2017-01-28 (×16): 500 mg via INTRAVENOUS
  Filled 2017-01-20 (×17): qty 5

## 2017-01-20 MED ORDER — SODIUM CHLORIDE 0.9% FLUSH
3.0000 mL | Freq: Two times a day (BID) | INTRAVENOUS | Status: DC
  Administered 2017-01-21 – 2017-01-27 (×10): 3 mL via INTRAVENOUS

## 2017-01-20 NOTE — H&P (Addendum)
History and Physical    Jill Johnston CHY:[id number] DOB: May 19, 1966 DOA: 01/20/2017  PCP: [name], MD  Patient coming from: Home   I have personally briefly reviewed patient's old medical records in [place]  Chief Complaint: Confusion, AMS  HPI: Jill Johnston is a 51 y.o. female with medical history significant of estrogen positive metastatic breast cancer, diagnosed 2008, she received Gemcitabine for  7 months recently , she has metastasis to liver which has progress, lung mets stable, brain metastasis, plan was to start (CMF) by her oncologist. She had MRI brain which showed Worsening nodular pachymeningeal/leptomeningeal enhancement over the left cerebral convexity consistent with progressive metastatic disease.   She was  brought today to the hospital with worsening confusion, unable to express herself, she is bilingual ( [place], [place] speaker ) she has only been able to speak in [place]. She has not been eating. She has been very weak and fell at home. She has been having problems with constipation.  She is anxious on my evaluation, asking frequently to use bathroom. Per husband no history of fever. She has only being  complaining of her chronic back pain  and ribs. They are having hard time at home controlling her pain.   ED Course: Patient presents with tachycardia, platelet 121, k 3.1, chloride 95, CO2 36, BUN 36, cr 1.7, calccium 15, glucose 93, lactic acid 3.15. [place]; Stable chronic extrahepatic biliary dilatation, likely physiologic status post cholecystectomy.  Multifocal hepatic metastatic disease as seen on previous studies. Chest x ray; Shallow lung inflation with patchy bibasilar opacities, right greater than left. Findings may reflect atelectasis or possibly infiltrates. CT head; 1. No acute intracranial abnormality or significant interval change. Dural based calcifications associated with known dural-based metastatic disease over the left convexity.  Osseous metastases. Lactic acid 3.15. Ammonia 38  Review of Systems: unable to obtain from patient due to confusion.    Past Medical History:  Diagnosis Date  . Anxiety   . 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 Breast cancer metastasized to multiple sites ([place]) 04/12/2016   bone, liver, lung and brain  . GERD (gastroesophageal reflux disease)   . History of radiation therapy 11/18/16 - 12/02/16  . Hypertension     Past Surgical History:  Procedure Laterality Date  . CHOLECYSTECTOMY    . ESOPHAGOGASTRODUODENOSCOPY (EGD) WITH PROPOFOL N/A 07/12/2015   Procedure: ESOPHAGOGASTRODUODENOSCOPY (EGD) WITH PROPOFOL;  Surgeon: Rogene Houston, MD;  Location: [facility];  Service: Endoscopy;  Laterality: N/A;  1:00  . IR GENERIC HISTORICAL  04/22/2016   IR US GUIDE VASC ACCESS RIGHT 04/22/2016 Markus Daft, MD [facility] RAD  . IR GENERIC HISTORICAL  04/22/2016   IR FLUORO GUIDE PORT INSERTION RIGHT 04/22/2016 Markus Daft, MD [facility]  . MASTECTOMY  April 2009   Left breast with lymph node resection     reports that she has never smoked. She has never used smokeless tobacco. She reports that she does not drink alcohol or use drugs.  No Known Allergies  Family History  Problem Relation Age of Onset  . Heart attack Mother        Died age 1  . Heart disease Father        Died age 75  . Brain cancer Father        pt not sure if he actually had this  . Cancer Maternal Grandmother  dx. NOS cancer at older age; d. 21  . Heart attack Maternal Grandfather 79  . Colon cancer Paternal Grandmother        d. 10s    Prior to Admission medications   Medication Sig Start Date End Date Taking? Authorizing Provider  docusate sodium (COLACE) 100 MG capsule Take 100 mg by mouth daily.    Yes [provider]  lidocaine-prilocaine (EMLA) cream Apply 1 application topically as needed. To port 1 hour before  going to be accessed with needle. Cover with plastic wrap. 04/17/16  Yes Livesay, Lennis P, MD  oxyCODONE (OXYCONTIN) 30 MG 12 hr tablet Take 30 mg by mouth 2 (two) times daily. 01/14/17  Yes Magrinat, Virgie Dad, MD  Oxycodone HCl 10 MG TABS Take 1 tablet (10 mg total) by mouth every 3 (three) hours as needed. Patient taking differently: Take 10 mg by mouth every 3 (three) hours as needed (For pain.).  01/14/17  Yes Magrinat, Virgie Dad, MD  pantoprazole (PROTONIX) 40 MG tablet TAKE 1 TABLET(40 MG) BY MOUTH TWICE DAILY BEFORE A MEAL 05/18/16  Yes Setzer, Terri L, NP  polyethylene glycol (MIRALAX / GLYCOLAX) packet Take 17 grams by mouth every day. 06/19/16  Yes Magrinat, Virgie Dad, MD  prochlorperazine (COMPAZINE) 10 MG tablet Take 1 tablet (10 mg total) by mouth every 6 (six) hours as needed (Nausea or vomiting). 01/14/17  Yes Magrinat, Virgie Dad, MD  dexamethasone (DECADRON) 4 MG tablet Take 2 tablets (8 mg total) by mouth daily. Start the day after chemotherapy for 2 days. Take with food. Patient not taking: Reported on 01/20/2017 01/14/17   Magrinat, Virgie Dad, MD  HYDROcodone-acetaminophen (HYCET) 7.5-325 mg/15 ml solution Take 15 mLs by mouth 4 (four) times daily as needed for moderate pain. Patient not taking: Reported on 01/20/2017 11/19/16 11/19/17  [name], MD  lisinopril (PRINIVIL,ZESTRIL) 10 MG tablet Take 1 tablet (10 mg total) by mouth daily. Patient not taking: Reported on 10/26/2016 01/23/16   Gordy Levan, MD    Physical Exam: Vitals:   01/20/17 1018 01/20/17 1019 01/20/17 1134 01/20/17 1433  BP: (!) 144/116   (!) 162/105  Pulse: (!) 124   93  Resp: 16   18  Temp: (!) 97.2 F (36.2 C)  98.3 F (36.8 C)   TempSrc: Axillary  Rectal   SpO2:    99%  Weight:  56.7 kg (125 lb)    Height:  5\' 5"  (1.651 m)      Constitutional: anxious, tremors.  Vitals:   01/20/17 1018 01/20/17 1019 01/20/17 1134 01/20/17 1433  BP: (!) 144/116   (!) 162/105  Pulse: (!) 124   93  Resp: 16    18  Temp: (!) 97.2 F (36.2 C)  98.3 F (36.8 C)   TempSrc: Axillary  Rectal   SpO2:    99%  Weight:  56.7 kg (125 lb)    Height:  5\' 5"  (1.651 m)     Eyes: PERRL, lids and  icteric conjunctivae  ENMT: Mucous membranes are dry . Posterior pharynx clear of any exudate or lesions.Normal dentition.  Neck: normal, supple, no masses, no thyromegaly Respiratory: decreased breath sounds, no wheezing, no crackles. Normal respiratory effort. No accessory muscle use.  Cardiovascular: Regular rate and rhythm, no murmurs / rubs / gallops. No extremity edema. 2+ pedal pulses. No carotid bruits.  Abdomen: no tenderness, no masses palpated. No hepatosplenomegaly. Bowel sounds positive.  Musculoskeletal: no clubbing / cyanosis. No joint deformity upper and lower  extremities. Good ROM, no contractures. Normal muscle tone.  Skin: no rashes, lesions, ulcers. No induration Neurologic: confuse, moves all four extremities.      Labs on Admission: I have personally reviewed following labs and imaging studies  CBC:  Recent Labs Lab 01/20/17 1156  WBC 7.6  HGB 13.3  HCT 41.5  MCV 98.6  PLT 712*   Basic Metabolic Panel:  Recent Labs Lab 01/20/17 1156  NA 141  K 3.1*  CL 95*  CO2 36*  GLUCOSE 93  BUN 36*  CREATININE 1.70*  CALCIUM >15.0*   GFR: Estimated Creatinine Clearance: 35 mL/min (A) (by C-G formula based on SCr of 1.7 mg/dL (H)). Liver Function Tests:  Recent Labs Lab 01/20/17 1156  AST 134*  ALT 49  ALKPHOS 221*  BILITOT 1.9*  PROT 6.9  ALBUMIN 2.8*   No results for input(s): LIPASE, AMYLASE in the last 168 hours.  Recent Labs Lab 01/20/17 1156  AMMONIA 38*   Coagulation Profile: No results for input(s): INR, PROTIME in the last 168 hours. Cardiac Enzymes: No results for input(s): CKTOTAL, CKMB, CKMBINDEX, TROPONINI in the last 168 hours. BNP (last 3 results) No results for input(s): PROBNP in the last 8760 hours. HbA1C: No results for input(s): HGBA1C in the  last 72 hours. CBG:  Recent Labs Lab 01/20/17 1213  GLUCAP 98   Lipid Profile: No results for input(s): CHOL, HDL, LDLCALC, TRIG, CHOLHDL, LDLDIRECT in the last 72 hours. Thyroid Function Tests: No results for input(s): TSH, T4TOTAL, FREET4, T3FREE, THYROIDAB in the last 72 hours. Anemia Panel: No results for input(s): VITAMINB12, FOLATE, FERRITIN, TIBC, IRON, RETICCTPCT in the last 72 hours. Urine analysis:    Component Value Date/Time   COLORURINE YELLOW 01/20/2017 1143   APPEARANCEUR HAZY (A) 01/20/2017 1143   LABSPEC 1.018 01/20/2017 1143   PHURINE 5.0 01/20/2017 1143   GLUCOSEU NEGATIVE 01/20/2017 1143   HGBUR SMALL (A) 01/20/2017 1143   BILIRUBINUR NEGATIVE 01/20/2017 1143   KETONESUR 5 (A) 01/20/2017 1143   PROTEINUR NEGATIVE 01/20/2017 1143   NITRITE NEGATIVE 01/20/2017 1143   LEUKOCYTESUR NEGATIVE 01/20/2017 1143    Radiological Exams on Admission: Dg Chest 2 View  Result Date: 01/20/2017 CLINICAL DATA:  Initial evaluation for acute altered mental status. EXAM: CHEST  2 VIEW COMPARISON:  Prior CT from 01/13/2017. FINDINGS: Right-sided Port-A-Cath in place. Cardiac and mediastinal silhouettes are within normal limits. Lungs are hypoinflated. No pulmonary edema or significant pleural effusion. No pneumothorax. Irregular patchy bibasilar opacities, right greater than left, which may reflect atelectasis or possibly infiltrates. Osseous metastases again noted. No acute osseus abnormality. Surgical clips overlie the left axilla. IMPRESSION: 1. Shallow lung inflation with patchy bibasilar opacities, right greater than left. Findings may reflect atelectasis or possibly infiltrates. 2. Osseous metastases. Electronically Signed   By: Jeannine Boga M.D.   On: 01/20/2017 13:07   Ct Head Wo Contrast  Result Date: 01/20/2017 CLINICAL DATA:  Altered mental status. Progressive confusion. Breast cancer with dural an osseous metastases. EXAM: CT HEAD WITHOUT CONTRAST TECHNIQUE:  Contiguous axial images were obtained from the base of the skull through the vertex without intravenous contrast. COMPARISON:  None. FINDINGS: Brain: Dural-based calcifications are associated with known metastatic disease. Extensive white matter changes are again noted. No acute cortical infarct, hemorrhage, or mass lesion is present. Basal ganglia are stable. The insular cortex is normal. The brainstem and cerebellum are normal. There is no mass effect. No significant extra-axial fluid collection is present. Vascular: Atherosclerotic calcifications are present within  the cavernous internal carotid artery's bilaterally. There is no hyperdense vessel. Skull: Multiple osseous metastases are similar to the prior studies. Both sclerotic and lucent lesions are present. Sinuses/Orbits: The paranasal sinuses and mastoid air cells are clear. The globes and orbits are within normal limits. IMPRESSION: 1. No acute intracranial abnormality or significant interval change. 2. Dural based calcifications associated with known dural-based metastatic disease over the left convexity. 3. Osseous metastases. Electronically Signed   By: San Morelle M.D.   On: 01/20/2017 12:39   US Abdomen Complete  Result Date: 01/20/2017 CLINICAL DATA:  Altered mental status. History of breast cancer and cholecystectomy. EXAM: ABDOMEN ULTRASOUND COMPLETE COMPARISON:  Abdominal CT and PET-CT 03/16/2016. Chest CT 01/13/2017. FINDINGS: Gallbladder: Cholecystectomy. Common bile duct: Diameter: 15 mm. This appears chronic and similar to previous CT. No intraductal filling defects are seen. Liver: Multiple hypoechoic masses are seen throughout the liver consistent with known metastatic disease. The largest lesion posteriorly in the right lobe measures 7.9 x 4.8 x 3.5 cm. IVC: No abnormality visualized. Pancreas: Visualized portion unremarkable. Spleen: Size and appearance within normal limits. Right Kidney: Length: 10.6 cm. Echogenicity within  normal limits. No mass or hydronephrosis visualized. Left Kidney: Length: 11.9 cm. Echogenicity within normal limits. No mass or hydronephrosis visualized. Abdominal aorta: No aneurysm visualized. Other findings: No ascites. IMPRESSION: 1. No acute findings are seen. 2. Stable chronic extrahepatic biliary dilatation, likely physiologic status post cholecystectomy. 3. Multifocal hepatic metastatic disease as seen on previous studies. Comparison between this ultrasound and prior CT/PET CT is limited. Follow up CT should be considered to better assess for disease progression. Electronically Signed   By: Richardean Sale M.D.   On: 01/20/2017 13:51    EKG: ordered.   Assessment/Plan Active Problems:   Malignant neoplasm of left breast (HCC)   Breast cancer metastasized to bone, left (HCC)   Essential hypertension   Portacath in place   HCAP (healthcare-associated pneumonia)   Sepsis ([place])   Hypercalcemia  1-Acute metabolic encephalopathy; secondary to hypercalcemia. Also component of brain metastasis diseases.  Admit to hospital. IV fluids. Correct hypercalcemia.  Discussed with Dr Jana Hakim, will start IV decadron 10 mg IV BID. IV keppra 500 mg IV BID in case of subclinical seizure.  Ammonia level at 38.   2-Hypercalcemia of malignancy,  Symptomatic hypercalcemia.  IV fluids mantainance. She has received 2 L IV fluids in the ED. Will order another litter.  Strict I and O.  Calcitonin 200 units IM BID for 4 doses.  Zoledronic Acid 4 mg IV.   3-PNA; chest x ray with bilateral infiltrates. Lactic acid at 3.5. Will cover empirically for PNA, IV vancomycin and Cefepime.  Blood culture.  Trend lactic acid.   4-AKI; pre renal; cr at 1.7 on admission. Last cr 0.9  IV fluids. Repeat labs in am.   5-Transaminases; likely from metastasis to liver.   6-Constipation; Bowel regimen. Check KUB.   7-Metastasis Breast Cancer; Brain, liver, bone.  Appreciate Dr Jana Hakim assistance.  He will see  patient in consultation.   8-Hypokalemia; replete orally.  9-HTN; Hydralazine PRN.    DVT prophylaxis: Lovenox.  Code Status: Full code, husband wants some times to discussed with family, friends.  Family Communication: Husband  Disposition Plan: to be determine.  Consults called: none Admission status: inpatient, step down unit    Niel Hummer A MD [facility] Pager [phone]  If 7PM-7AM, please contact night-coverage [email or web address] Password [facility]  01/20/2017, 3:53 PM

## 2017-01-20 NOTE — ED Notes (Signed)
Date and time results received: 01/20/17 1:07 PM   Test: Calcium Critical Value: greater than 15  Name of Provider Notified: Theotis Burrow, MD  Orders Received? Or Actions Taken?: awaiting orders

## 2017-01-20 NOTE — Progress Notes (Signed)
Pharmacy Antibiotic Note  Jill Johnston is a 51 y.o. female with metastatic breast cancer currently undergoing chemotherapy treatment presented to the ED on 01/20/2017 with c/o AMS.  To start vancomycin for suspected PNA.  - 7/25 CXR: Shallow lung inflation with patchy bibasilar opacities, right greater than left. Findings may reflect atelectasis or possibly infiltrates. - wbc wnl, scr 1.70 (crcl~35)   Plan: - vancomycin 750 mg IV q24h  ____________________________  Height: 5\' 5"  (165.1 cm) Weight: 125 lb (56.7 kg) IBW/kg (Calculated) : 57  Temp (24hrs), Avg:97.8 F (36.6 C), Min:97.2 F (36.2 C), Max:98.3 F (36.8 C)   Recent Labs Lab 01/20/17 1156 01/20/17 1207  WBC 7.6  --   CREATININE 1.70*  --   LATICACIDVEN  --  3.15*    Estimated Creatinine Clearance: 35 mL/min (A) (by C-G formula based on SCr of 1.7 mg/dL (H)).    No Known Allergies   Thank you for allowing pharmacy to be a part of this patient's care.  Lynelle Doctor 01/20/2017 1:26 PM

## 2017-01-20 NOTE — ED Triage Notes (Addendum)
Pt family at bedside verbalizes pt worsening confusion and weakness since Saturday; had MRI on Monday. Pt has hx of metastasis with known breast cancer; pt jaundice with triage.

## 2017-01-20 NOTE — Progress Notes (Signed)
Jill Johnston   DOB:06-23-1966   HQ#:[id number]   KZL#:[id number]  Subjective: From the patient's husband and family friends I learn Jill Johnston had a very rough night last night, had a fall, and was confused. She was persuaded to come to the hospital where she was found to be near-obtunded and hypercalcemia. -- Recent restaging studies have documented disease progression in the liver and brain; the hypercalcemia likely reflects progression in bone as well.  I have discussed the situation with the neuro-oncology group (Dr [name] and Domingo Cocking). 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 Theree could be subclinical seizures, however, and the fact that the patient is s/p whole brain irradiation lso limits the brains ability to deal with stress (even a UTI for example)  I discussed with Jill Johnston that under the current circumstances chemotherapy would only make things worse and suggested a comfort care/ palliative care approach is appropriate, at least until Jill Johnston "wakes up" and can participate in the discussion. I am hopeful this can come about over the next few days   Objective: middle aged [place] Johnston examined in bed Vitals:   01/20/17 1800 01/20/17 1838  BP: (!) 149/91 113/76  Pulse: 85 87  Resp: 19 11  Temp:      Body mass index is 19.46 kg/m. No intake or output data in the 24 hours ending 01/20/17 1854  opens eyes briefly to touch and voice grimaces spontaneously, suggesting pain Lungs no rales or rhonchi, auscultated anterolaterally Hear RRR +BS  CBG (last 3)   Recent Labs  01/20/17 1213  GLUCAP 98     Labs:  Lab Results  Component Value Date   WBC 7.6 01/20/2017   HGB 13.3 01/20/2017   HCT 41.5 01/20/2017   MCV 98.6 01/20/2017   PLT 121 (L) 01/20/2017   NEUTROABS 9.7 (H) 01/13/2017    @LASTCHEMISTRY @  Urine Studies No results for  input(s): UHGB, CRYS in the last 72 hours.  Invalid input(s): UACOL, UAPR, USPG, UPH, UTP, UGL, UKET, UBIL, UNIT, UROB, ULEU, UEPI, UWBC, URBC, UBAC, CAST, UCOM, BILUA  Basic Metabolic Panel:  Recent Labs Lab 01/20/17 1156  NA 141  K 3.1*  CL 95*  CO2 36*  GLUCOSE 93  BUN 36*  CREATININE 1.70*  CALCIUM >15.0*   GFR Estimated Creatinine Clearance: 33.8 mL/min (A) (by C-G formula based on SCr of 1.7 mg/dL (H)). Liver Function Tests:  Recent Labs Lab 01/20/17 1156  AST 134*  ALT 49  ALKPHOS 221*  BILITOT 1.9*  PROT 6.9  ALBUMIN 2.8*   No results for input(s): LIPASE, AMYLASE in the last 168 hours.  Recent Labs Lab 01/20/17 1156  AMMONIA 38*   Coagulation profile No results for input(s): INR, PROTIME in the last 168 hours.  CBC:  Recent Labs Lab 01/20/17 1156  WBC 7.6  HGB 13.3  HCT 41.5  MCV 98.6  PLT 121*   Cardiac Enzymes: No results for input(s): CKTOTAL, CKMB, CKMBINDEX, TROPONINI in the last 168 hours. BNP: Invalid input(s): POCBNP CBG:  Recent Labs Lab 01/20/17 1213  GLUCAP 98   D-Dimer No results for input(s): DDIMER in the last 72 hours. Hgb A1c No results for input(s): HGBA1C in the last 72 hours. Lipid Profile No results for input(s): CHOL, HDL, LDLCALC, TRIG, CHOLHDL, LDLDIRECT in the last 72 hours. Thyroid function studies No results for input(s): TSH, T4TOTAL, T3FREE, THYROIDAB in the last 72 hours.  Invalid input(s): FREET3 Anemia work up No results for input(s): VITAMINB12, FOLATE, FERRITIN, TIBC, IRON, RETICCTPCT in the last 72 hours. Microbiology No results found for this or any previous visit (from the past 240 hour(s)).    Studies:  Dg Chest 2 View  Result Date: 01/20/2017 CLINICAL DATA:  Initial evaluation for acute altered mental status. EXAM: CHEST  2 VIEW COMPARISON:  Prior CT from 01/13/2017. FINDINGS: Right-sided Port-A-Cath in place. Cardiac and mediastinal silhouettes are within normal limits. Lungs are  hypoinflated. No pulmonary edema or significant pleural effusion. No pneumothorax. Irregular patchy bibasilar opacities, right greater than left, which may reflect atelectasis or possibly infiltrates. Osseous metastases again noted. No acute osseus abnormality. Surgical clips overlie the left axilla. IMPRESSION: 1. Shallow lung inflation with patchy bibasilar opacities, right greater than left. Findings may reflect atelectasis or possibly infiltrates. 2. Osseous metastases. Electronically Signed   By: Jeannine Boga M.D.   On: 01/20/2017 13:07   Dg Abd 1 View  Result Date: 01/20/2017 CLINICAL DATA:  Constipation EXAM: ABDOMEN - 1 VIEW COMPARISON:  None. FINDINGS: Scattered large and small bowel gas is noted. Postsurgical changes are noted consistent cholecystectomy. No definitive renal or ureteral calculi are seen. Fecal material is noted throughout the colon consistent with a mild degree of constipation. Sclerotic changes are noted within the pelvic bones and ribcage consistent with metastatic disease. IMPRESSION: Changes consistent with bony metastatic disease. Mild constipation is seen. Electronically Signed   By: Inez Catalina M.D.   On: 01/20/2017 17:15   Ct Head Wo Contrast  Result Date: 01/20/2017 CLINICAL DATA:  Altered mental status. Progressive confusion. Breast cancer with dural an osseous metastases. EXAM: CT HEAD WITHOUT CONTRAST TECHNIQUE: Contiguous axial images were obtained from the base of the skull through the vertex without intravenous contrast. COMPARISON:  None. FINDINGS: Brain: Dural-based calcifications are associated with known metastatic disease. Extensive white matter changes are again noted. No acute cortical infarct, hemorrhage, or mass lesion is present. Basal ganglia are stable. The insular cortex is normal. The brainstem and cerebellum are normal. There is no mass effect. No significant extra-axial fluid collection is present. Vascular: Atherosclerotic calcifications are  present within the cavernous internal carotid artery's bilaterally. There is no hyperdense vessel. Skull: Multiple osseous metastases are similar to the prior studies. Both sclerotic and lucent lesions are present. Sinuses/Orbits: The paranasal sinuses and mastoid air cells are clear. The globes and orbits are within normal limits. IMPRESSION: 1. No acute intracranial abnormality or significant interval change. 2. Dural based calcifications associated with known dural-based metastatic disease over the left convexity. 3. Osseous metastases. Electronically Signed   By: Jill Morelle M.D.   On: 01/20/2017 12:39   US Abdomen Complete  Result Date: 01/20/2017 CLINICAL DATA:  Altered mental status. History of breast cancer and cholecystectomy. EXAM: ABDOMEN ULTRASOUND COMPLETE COMPARISON:  Abdominal CT and PET-CT 03/16/2016. Chest CT 01/13/2017. FINDINGS: Gallbladder: Cholecystectomy. Common bile duct: Diameter: 15 mm. This appears chronic and similar to previous CT. No intraductal filling defects are seen. Liver: Multiple hypoechoic masses are seen throughout the liver consistent with known metastatic disease. The largest lesion posteriorly in the right lobe measures 7.9 x 4.8 x 3.5 cm. IVC: No abnormality visualized. Pancreas: Visualized portion unremarkable. Spleen: Size and appearance within normal limits. Right Kidney: Length: 10.6 cm. Echogenicity within normal limits. No mass or hydronephrosis visualized. Left Kidney: Length: 11.9 cm. Echogenicity within normal limits. No mass or hydronephrosis visualized. Abdominal aorta: No aneurysm visualized. Other findings: No ascites. IMPRESSION: 1.  No acute findings are seen. 2. Stable chronic extrahepatic biliary dilatation, likely physiologic status post cholecystectomy. 3. Multifocal hepatic metastatic disease as seen on previous studies. Comparison between this ultrasound and prior CT/PET CT is limited. Follow up CT should be considered to better assess for  disease progression. Electronically Signed   By: Richardean Sale M.D.   On: 01/20/2017 13:51    Assessment: 51 y.o. BRCA negative [place] originally from [place]  (1) status post left breast upper inner quadrant biopsy 05/17/2007 for a clinical mT2 N2, stage IIIA invasive ductal breast cancer, strongly estrogen receptor positive, weakly progesterone receptor positive, HER-2 negative, with an MIB-1 of 52%  (2) neoadjuvant chemotherapy consisted of dose dense doxorubicin and cyclophosphamide 4 followed by weekly paclitaxel 12, started 06/10/2007, completed 09/28/2007  (3) status post left modified radical mastectomy 10/27/2007 at [facility] [phone]) removed that invasive ductal carcinoma, grade 2, pT2, pN1a (downstaged to IIB), estrogen receptor strongly positive, progesterone receptor weakly positive, HER-2 not amplified by immunohistochemistry or FISH with a signals ratio of 0.94, number per cell 3.4  (4) status post adjuvant radiation to the left chest wall as well as the left supraclavicular and axillary nodal regions (50.4 gray +10 Gray boost) given between 12/15/2007 on 01/31/2008  (5) on adjuvant tamoxifen 02/20/2008 through March 2016  METASTATIC DISEASE: March 2016, presenting with bone pain (6) staging studies April 2016 showed diffuse bony metastatic disease, intrathoracic adenopathy, and small lung lesions.             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             (a) brain MRI 07/16/2016 interpreted as requiring only further follow-up             (b) brain MRI 10/22/2016 read as stable  (c) brain MRI 01/18/2017 shows progression  (10) repeat genetic [facility]2016 [facility] mutations in APC, ATM, BARD1, BMPR1A, BRCA1, BRCA2, BRIP1, CHD1, CDK4, CDKN2A, CHEK2, EPCAM (large rearrangement only),GREM1/SCG5, MLH1, MSH2, MSH6, MUTYH, NBN, PALB2, PMS2, POLD1, POLE, PTEN, RAD51C, RAD51D, SMAD4, STK11, and TP53  (11) gemcitabine started 04/24/2016,  given days 1 and 8 of each 21 day cycle             (a) day 8 cycle 1 delayed because of cytopenias--dose reduced and neupogen added; refuses neulasta/onpro             (b) CT scan of the chest 07/16/2016 shows decrease in the size of liver metastases.             (c) chest CT 10/15/2016 shows liver metastases to be stable, no lung metastases; new sternal mets?             (  d) cycle 10 delayed 4 weeks because of intervening radiation             (e) cycle 11 delayed one week because of cytopenias             (f) CT scan of the chest 01/13/2017 shows disease progression in the liver--gemcitabine stopped  (12) Radiation treatment dates:11/18/16 - 12/02/16 Site/dose:c-spine treated to 30 Gy with 10 fractions of 3 Gy  (13) to start cyclophosphamide/ methotrexate/ fluorouracil 01/15/2017  (14) hypercalcemia of malignancy 01/20/2017  (15) poorly controlled pain-- on chronic narcotics    Plan:  Jill Johnston's cancer has progressed through its current treatments. The mental status changes are most likely caused by hypercalcemia, due to progression of cancer in bone. But there may be elements due to brain problems-- the fact that she had prior whole  brain irradiation, the fact that she now has progression in the brain with at least localized leptomeningeal spread, and the possibility of subclinical seizures/ status epilepticus. She also has progression in the liver and I have added an ammonia to AM labs  I discussed with her husband Jill Johnston that we are likely dealing with irreversible or only partially reversible developments, that the neuro-oncology group sees no benefit to brain irradiation at this point, and that giving Jill Johnston more chemotherapy in this setting might only further compromise her functional status over the remaining period of life, which may be brief (weeks).  I am hopeful Jill Johnston will "clear up" some over the next few days--the hypercalcemia is being treated aggressively and if that is the only cause of the confusion I would expect to see improvement in 2-4 days.  In the meantime she appears uncomfortable. She takes oxycontin 30 mg BID plus oxycodone PRN at home and she is now not taking po's. I have liberalized the IV dilaudid and added a fentanyl patch to make sure she has some analgesia and to avoid withdrawal. These doses may need to be increased as she becomes more alert. I also wrote for a foley and changed most of her meds to IV  She remains a full code. Accepting Jill Johnston's situation has been very difficult for her husband Jill Johnston. I am hopeful this admission over several discussions he may accept a DNR order, which is appropriate in this setting in my view.  Thank you for your hep to this patient and her family! [name], MD 01/20/2017  6:54 PM Medical Oncology and Hematology [facility] [address] [place], [place] [address] Tel. [phone]    Fax. [phone]

## 2017-01-20 NOTE — ED Provider Notes (Signed)
[facility] DEPT Provider Note   CSN: [id number] Arrival date & time: 01/20/17  8144     History   Chief Complaint Chief Complaint  Patient presents with  . Altered Mental Status  . Cancer    HPI Jill Johnston is a 51 y.o. female.  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  Pt is confused, only speaking in [place] (she is fluent in both [place] and [place]).  Pt had a fall yesterday and hit her head.  Husband denies that she is having any incontinence of urine or feces and states she is using the bathroom the normal amount without issue other than mild constipation that has been present since being on chronic opioids for pain from her breast cancer.  Husband denies that patient has had fever, cough or that patient has been around sick contacts.  He does mention that pt has been losing weight, hasn't been eating or drinking much, and is sleeping more and seems to be getting weaker and weaker.        Past Medical History:  Diagnosis Date  . Anxiety   . 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 Breast cancer metastasized to multiple sites ([place]) 04/12/2016   bone, liver, lung and brain  . GERD (gastroesophageal reflux disease)   . History of radiation therapy 11/18/16 - 12/02/16  . Hypertension     Patient Active Problem List   Diagnosis Date Noted  . Malignant neoplasm of upper-inner quadrant of left breast in female, estrogen receptor positive ([place]) 10/30/2016  . HCAP (healthcare-associated pneumonia) 05/17/2016  . Genetic testing 05/11/2016  . Chemotherapy-induced thrombocytopenia 05/10/2016  . Leukopenia due to antineoplastic chemotherapy ([place]) 05/10/2016  . Metastasis to liver ([place]) 04/24/2016  . Breast cancer metastasized to lung, left  ([place]) 04/24/2016  . Portacath in place 04/24/2016  . Oral thrush 04/24/2016  . Numbness 04/16/2016  . Essential hypertension 04/16/2016  . Carcinoma of left breast metastatic to multiple sites ([place]) 04/12/2016  . 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 Gastroesophageal reflux disease with esophagitis 03/20/2016  . Drug-induced leukopenia ([place]) 01/26/2016  . High risk medication use 01/26/2016  . Lymphedema of left arm 01/12/2016  . Status post cholecystectomy 01/12/2016  . Cancer associated pain 01/12/2016  . Breast cancer metastasized to bone, left ([place]) 01/12/2016  . Gastroesophageal reflux disease 07/04/2015  . Bone metastases ([place]) 01/07/2015  . Malignant neoplasm of left breast [facility])     Past Surgical History:  Procedure Laterality Date  . CHOLECYSTECTOMY    . ESOPHAGOGASTRODUODENOSCOPY (EGD) WITH PROPOFOL N/A 07/12/2015   Procedure: ESOPHAGOGASTRODUODENOSCOPY (EGD) WITH PROPOFOL;  Surgeon: Rogene Houston, MD;  Location: [facility] SUITE;  Service: Endoscopy;  Laterality: N/A;  1:00  . IR GENERIC HISTORICAL  04/22/2016   IR US GUIDE VASC ACCESS RIGHT 04/22/2016 Markus Daft, MD [facility]-INTERV RAD  . IR GENERIC HISTORICAL  04/22/2016   IR FLUORO GUIDE PORT INSERTION RIGHT 04/22/2016 Markus Daft, MD [facility]-INTER[facility] RAD  . MASTECTOMY  April 2009   Left breast with lymph node resection    OB History    No data available       Home Medications    Prior to Admission medications   Medication Sig  Start Date End Date Taking? Authorizing Provider  docusate sodium (COLACE) 100 MG capsule Take 100 mg by mouth daily.    Yes [provider]  lidocaine-prilocaine (EMLA) cream Apply 1 application topically as needed. To port 1 hour before going to be accessed with needle. Cover with plastic wrap. 04/17/16  Yes Livesay, Lennis P, MD  oxyCODONE (OXYCONTIN) 30 MG 12 hr tablet Take 30 mg by mouth 2 (two) times daily. 01/14/17   Yes Magrinat, Virgie Dad, MD  Oxycodone HCl 10 MG TABS Take 1 tablet (10 mg total) by mouth every 3 (three) hours as needed. Patient taking differently: Take 10 mg by mouth every 3 (three) hours as needed (For pain.).  01/14/17  Yes Magrinat, Virgie Dad, MD  pantoprazole (PROTONIX) 40 MG tablet TAKE 1 TABLET(40 MG) BY MOUTH TWICE DAILY BEFORE A MEAL 05/18/16  Yes Setzer, Terri L, NP  polyethylene glycol (MIRALAX / GLYCOLAX) packet Take 17 grams by mouth every day. 06/19/16  Yes Magrinat, Virgie Dad, MD  prochlorperazine (COMPAZINE) 10 MG tablet Take 1 tablet (10 mg total) by mouth every 6 (six) hours as needed (Nausea or vomiting). 01/14/17  Yes Magrinat, Virgie Dad, MD  dexamethasone (DECADRON) 4 MG tablet Take 2 tablets (8 mg total) by mouth daily. Start the day after chemotherapy for 2 days. Take with food. Patient not taking: Reported on 01/20/2017 01/14/17   Magrinat, Virgie Dad, MD  HYDROcodone-acetaminophen (HYCET) 7.5-325 mg/15 ml solution Take 15 mLs by mouth 4 (four) times daily as needed for moderate pain. Patient not taking: Reported on 01/20/2017 11/19/16 11/19/17  [name], MD  lisinopril (PRINIVIL,ZESTRIL) 10 MG tablet Take 1 tablet (10 mg total) by mouth daily. Patient not taking: Reported on 10/26/2016 01/23/16   Gordy Levan, MD    Family History Family History  Problem Relation Age of Onset  . Heart attack Mother        Died age 12  . Heart disease Father        Died age 55  . Brain cancer Father        pt not sure if he actually had this  . Cancer Maternal Grandmother        dx. NOS cancer at older age; d. 14  . Heart attack Maternal Grandfather 79  . Colon cancer Paternal Grandmother        d. 11s    Social History Social History  Substance Use Topics  . Smoking status: Never Smoker  . Smokeless tobacco: Never Used  . Alcohol use No     Allergies   Patient has no known allergies.   Review of Systems Review of Systems  Constitutional: Positive for activity  change and appetite change. Negative for chills and fever.  HENT: Negative for ear pain and sore throat.   Eyes: Negative for pain and visual disturbance.  Respiratory: Negative for cough and shortness of breath.   Cardiovascular: Negative for chest pain and palpitations.  Gastrointestinal: Positive for abdominal pain (RUQ) and constipation. Negative for blood in stool, diarrhea and vomiting.  Genitourinary: Negative for dysuria and hematuria.  Musculoskeletal: Negative for arthralgias and back pain.  Skin: Negative for color change and rash.  Neurological: Negative for seizures and syncope.  Psychiatric/Behavioral: Positive for agitation, confusion and dysphoric mood.  All other systems reviewed and are negative.    Physical Exam Updated Vital Signs BP (!) 144/116 (BP Location: Right Arm)   Pulse (!) 124   Temp 98.3 F (36.8 C) (Rectal)  Resp 16   Ht 5\' 5"  (1.651 m)   Wt 56.7 kg (125 lb)   BMI 20.80 kg/m   Physical Exam  Constitutional: She appears well-developed and well-nourished. No distress.  HENT:  Head: Normocephalic and atraumatic.  Eyes: Conjunctivae are normal.  Neck: Neck supple.  Cardiovascular: Regular rhythm, S1 normal and S2 normal.  Tachycardia present.  Exam reveals no S3 and no S4.   No murmur heard. Pulmonary/Chest: Breath sounds normal. No respiratory distress.  Abdominal: Soft. There is no tenderness.  Musculoskeletal: She exhibits no edema.  Neurological: She is alert. She is disoriented.  Skin: Skin is warm and dry.  Psychiatric: She has a normal mood and affect.  Nursing note and vitals reviewed.    ED Treatments / Results  Labs (all labs ordered are listed, but only abnormal results are displayed) Labs Reviewed  URINALYSIS, ROUTINE W REFLEX MICROSCOPIC - Abnormal; Notable for the following:       Result Value   APPearance HAZY (*)    Hgb urine dipstick SMALL (*)    Ketones, ur 5 (*)    Bacteria, UA RARE (*)    Squamous Epithelial / LPF  0-5 (*)    All other components within normal limits  I-STAT CG4 LACTIC ACID, ED - Abnormal; Notable for the following:    Lactic Acid, Venous 3.15 (*)    All other components within normal limits  CULTURE, BLOOD (ROUTINE X 2)  CULTURE, BLOOD (ROUTINE X 2)  URINE CULTURE  COMPREHENSIVE METABOLIC PANEL  CBC  BILIRUBIN, DIRECT  AMMONIA  CBG MONITORING, ED    EKG  EKG Interpretation None       Radiology Mr Jeri Cos Wo Contrast  Result Date: 01/18/2017 CLINICAL DATA:  Metastatic breast cancer. Whole-brain radiation in 2017. Left optic nerve injury. EXAM: MRI HEAD AND ORBITS WITHOUT AND WITH CONTRAST TECHNIQUE: Multiplanar, multiecho pulse sequences of the brain and surrounding structures were obtained without and with intravenous contrast. Multiplanar, multiecho pulse sequences of the orbits and surrounding structures were obtained including fat saturation techniques, before and after intravenous contrast administration. CONTRAST:  72mL MULTIHANCE GADOBENATE DIMEGLUMINE 529 MG/ML IV SOLN COMPARISON:  10/22/2016 FINDINGS: MRI HEAD FINDINGS Brain: There is no evidence of acute infarct, intracranial hemorrhage, midline shift, or extra-axial fluid collection. The ventricles and sulci are normal in size. Patchy to confluent T2 hyperintensities in the cerebral white matter are similar to the prior study and likely reflect post treatment changes. Mild cerebellar tonsillar ectopia is unchanged. There are multiple foci nodular enhancement involving the dura and along the superficial surface of the brain over the left frontal and left parietal convexities which have increased from the prior study. The largest single nodular focus of enhancement measures 11 x 5 mm (series 17, image 115). Background mild diffuse smooth dural thickening and enhancement over the left cerebral convexity has mildly increased. Mild smooth dural enhancement in the right parieto-occipital region subjacent to an osseous metastasis  is similar to the prior study (series 17, image 94). No definite intra-axial metastases are identified. Vascular: Major intracranial vascular flow voids are preserved. Skull and upper cervical spine: Widespread osseous metastatic disease as previously seen. Unchanged chronic C3 superior endplate fracture. Other: None. MRI ORBITS FINDINGS Orbits: The globes appear intact. The optic nerves are symmetric in appearance without definite nerve edema or abnormal enhancement within limitations of motion artifact on postcontrast sequences. No orbital mass is identified. The extra-ocular muscles are symmetric and normal in appearance. The lacrimal glands are grossly  unremarkable. Visualized sinuses: Minimal bilateral ethmoid and right maxillary sinus mucosal thickening. Bilateral mastoid effusions. Soft tissues: Unremarkable. IMPRESSION: 1. Worsening nodular pachymeningeal/leptomeningeal enhancement over the left cerebral convexity consistent with progressive metastatic disease. 2. Widespread osseous metastases. 3. No acute abnormality or evidence of metastatic disease in the orbits. Electronically Signed   By: Logan Bores M.D.   On: 01/18/2017 17:42   Mr Rosealee Albee QQ Contrast  Result Date: 01/18/2017 CLINICAL DATA:  Metastatic breast cancer. Whole-brain radiation in 2017. Left optic nerve injury. EXAM: MRI HEAD AND ORBITS WITHOUT AND WITH CONTRAST TECHNIQUE: Multiplanar, multiecho pulse sequences of the brain and surrounding structures were obtained without and with intravenous contrast. Multiplanar, multiecho pulse sequences of the orbits and surrounding structures were obtained including fat saturation techniques, before and after intravenous contrast administration. CONTRAST:  88mL MULTIHANCE GADOBENATE DIMEGLUMINE 529 MG/ML IV SOLN COMPARISON:  10/22/2016 FINDINGS: MRI HEAD FINDINGS Brain: There is no evidence of acute infarct, intracranial hemorrhage, midline shift, or extra-axial fluid collection. The ventricles  and sulci are normal in size. Patchy to confluent T2 hyperintensities in the cerebral white matter are similar to the prior study and likely reflect post treatment changes. Mild cerebellar tonsillar ectopia is unchanged. There are multiple foci nodular enhancement involving the dura and along the superficial surface of the brain over the left frontal and left parietal convexities which have increased from the prior study. The largest single nodular focus of enhancement measures 11 x 5 mm (series 17, image 115). Background mild diffuse smooth dural thickening and enhancement over the left cerebral convexity has mildly increased. Mild smooth dural enhancement in the right parieto-occipital region subjacent to an osseous metastasis is similar to the prior study (series 17, image 94). No definite intra-axial metastases are identified. Vascular: Major intracranial vascular flow voids are preserved. Skull and upper cervical spine: Widespread osseous metastatic disease as previously seen. Unchanged chronic C3 superior endplate fracture. Other: None. MRI ORBITS FINDINGS Orbits: The globes appear intact. The optic nerves are symmetric in appearance without definite nerve edema or abnormal enhancement within limitations of motion artifact on postcontrast sequences. No orbital mass is identified. The extra-ocular muscles are symmetric and normal in appearance. The lacrimal glands are grossly unremarkable. Visualized sinuses: Minimal bilateral ethmoid and right maxillary sinus mucosal thickening. Bilateral mastoid effusions. Soft tissues: Unremarkable. IMPRESSION: 1. Worsening nodular pachymeningeal/leptomeningeal enhancement over the left cerebral convexity consistent with progressive metastatic disease. 2. Widespread osseous metastases. 3. No acute abnormality or evidence of metastatic disease in the orbits. Electronically Signed   By: Logan Bores M.D.   On: 01/18/2017 17:42    Procedures Procedures (including critical  care time)  Medications Ordered in ED Medications  sodium chloride 0.9 % bolus 1,000 mL (not administered)  dexamethasone (DECADRON) injection 10 mg (not administered)     Initial Impression / Assessment and Plan / ED Course  I have reviewed the triage vital signs and the nursing notes.  Pertinent labs & imaging results that were available during my care of the patient were reviewed by me and considered in my medical decision making (see chart for details).     Altered Mental Status: DDX  leptomeningeal inflammation 2/2 metastasis, hypercalcemia, Hepatic Encephalopathy, ICH,  less likely infectious etiology -Head CT due to recent fall -Decadron 10mg  for leptomeningeal inflammation -lactic acid elevated but patient very dehydrated -US abdomen, Liver function workup -Admit to medicine floor for further evaluation -Pt with hypercalcemia >15 will treat with fluids, calcitonin, bisphosphonate  Final Clinical Impressions(s) / ED  Diagnoses   Final diagnoses:  None    New Prescriptions New Prescriptions   No medications on file     Katherine Roan, MD 01/20/17 1234    Katherine Roan, MD 01/20/17 1333    Little, Wenda Overland, MD 01/22/17 1302

## 2017-01-20 NOTE — Progress Notes (Signed)
Pharmacy Antibiotic Note  Please see consult note from Dia Sitter, PharmD for full details.  Pharmacy now consulted to dose Cefepime.  Antimicrobials this admission:  7/25 vanc>> 7/25 cefepime >>  Dose adjustments this admission:  ---  Microbiology results:  7/25 BCx x2:  7/25 UCx:   Plan:  Cefepime 2g IV q24h  Follow up renal function & cultures  Peggyann Juba, PharmD, BCPS Pager: [phone] 01/20/2017 3:36 PM

## 2017-01-21 ENCOUNTER — Inpatient Hospital Stay (HOSPITAL_COMMUNITY): Payer: BLUE CROSS/BLUE SHIELD

## 2017-01-21 DIAGNOSIS — E860 Dehydration: Secondary | ICD-10-CM

## 2017-01-21 DIAGNOSIS — K5900 Constipation, unspecified: Secondary | ICD-10-CM

## 2017-01-21 DIAGNOSIS — C50912 Malignant neoplasm of unspecified site of left female breast: Secondary | ICD-10-CM

## 2017-01-21 DIAGNOSIS — N179 Acute kidney failure, unspecified: Secondary | ICD-10-CM

## 2017-01-21 DIAGNOSIS — R0902 Hypoxemia: Secondary | ICD-10-CM

## 2017-01-21 DIAGNOSIS — G9341 Metabolic encephalopathy: Secondary | ICD-10-CM

## 2017-01-21 DIAGNOSIS — I10 Essential (primary) hypertension: Secondary | ICD-10-CM

## 2017-01-21 DIAGNOSIS — E43 Unspecified severe protein-calorie malnutrition: Secondary | ICD-10-CM

## 2017-01-21 DIAGNOSIS — J189 Pneumonia, unspecified organism: Secondary | ICD-10-CM

## 2017-01-21 LAB — COMPREHENSIVE METABOLIC PANEL
ALT: 48 U/L (ref 14–54)
AST: 123 U/L — AB (ref 15–41)
Albumin: 2.3 g/dL — ABNORMAL LOW (ref 3.5–5.0)
Alkaline Phosphatase: 174 U/L — ABNORMAL HIGH (ref 38–126)
Anion gap: 7 (ref 5–15)
BUN: 31 mg/dL — AB (ref 6–20)
CHLORIDE: 106 mmol/L (ref 101–111)
CO2: 29 mmol/L (ref 22–32)
CREATININE: 1.4 mg/dL — AB (ref 0.44–1.00)
Calcium: 11.2 mg/dL — ABNORMAL HIGH (ref 8.9–10.3)
GFR calc Af Amer: 49 mL/min — ABNORMAL LOW (ref >60)
GFR, EST NON AFRICAN AMERICAN: 43 mL/min — AB (ref >60)
Glucose, Bld: 130 mg/dL — ABNORMAL HIGH (ref 65–99)
Potassium: 3.4 mmol/L — ABNORMAL LOW (ref 3.5–5.1)
Sodium: 142 mmol/L (ref 135–145)
Total Bilirubin: 1.2 mg/dL (ref 0.3–1.2)
Total Protein: 5.7 g/dL — ABNORMAL LOW (ref 6.5–8.1)

## 2017-01-21 LAB — MAGNESIUM: Magnesium: 1.5 mg/dL — ABNORMAL LOW (ref 1.7–2.4)

## 2017-01-21 LAB — CBC
HCT: 34.9 % — ABNORMAL LOW (ref 36.0–46.0)
Hemoglobin: 11.4 g/dL — ABNORMAL LOW (ref 12.0–15.0)
MCH: 31.6 pg (ref 26.0–34.0)
MCHC: 32.7 g/dL (ref 30.0–36.0)
MCV: 96.7 fL (ref 78.0–100.0)
PLATELETS: 110 10*3/uL — AB (ref 150–400)
RBC: 3.61 MIL/uL — ABNORMAL LOW (ref 3.87–5.11)
RDW: 17.6 % — AB (ref 11.5–15.5)
WBC: 6.8 10*3/uL (ref 4.0–10.5)

## 2017-01-21 LAB — URINE CULTURE

## 2017-01-21 LAB — AMMONIA: Ammonia: 29 umol/L (ref 9–35)

## 2017-01-21 LAB — LACTIC ACID, PLASMA: Lactic Acid, Venous: 1.6 mmol/L (ref 0.5–1.9)

## 2017-01-21 MED ORDER — HYDROMORPHONE HCL-NACL 0.5-0.9 MG/ML-% IV SOSY
0.5000 mg | PREFILLED_SYRINGE | Freq: Four times a day (QID) | INTRAVENOUS | Status: DC
  Administered 2017-01-21 (×2): 0.5 mg via INTRAVENOUS
  Filled 2017-01-21 (×2): qty 1

## 2017-01-21 MED ORDER — FUROSEMIDE 10 MG/ML IJ SOLN
40.0000 mg | Freq: Every day | INTRAMUSCULAR | Status: DC
  Administered 2017-01-21 – 2017-01-25 (×5): 40 mg via INTRAVENOUS
  Filled 2017-01-21 (×4): qty 4

## 2017-01-21 MED ORDER — MAGNESIUM SULFATE 4 GM/100ML IV SOLN
4.0000 g | Freq: Once | INTRAVENOUS | Status: AC
  Administered 2017-01-21: 4 g via INTRAVENOUS
  Filled 2017-01-21: qty 100

## 2017-01-21 MED ORDER — BISACODYL 10 MG RE SUPP
10.0000 mg | Freq: Once | RECTAL | Status: AC
Start: 2017-01-21 — End: 2017-01-21
  Administered 2017-01-21: 10 mg via RECTAL
  Filled 2017-01-21: qty 1

## 2017-01-21 MED ORDER — MORPHINE SULFATE (PF) 2 MG/ML IV SOLN
INTRAVENOUS | Status: AC
  Administered 2017-01-22: 2 mg via INTRAVENOUS
  Filled 2017-01-21: qty 1

## 2017-01-21 MED ORDER — METOPROLOL TARTRATE 5 MG/5ML IV SOLN
5.0000 mg | Freq: Once | INTRAVENOUS | Status: AC
  Administered 2017-01-21: 5 mg via INTRAVENOUS
  Filled 2017-01-21: qty 5

## 2017-01-21 MED ORDER — ADULT MULTIVITAMIN W/MINERALS CH: 1.0000 | ORAL_TABLET | Freq: Every day | ORAL | Status: DC

## 2017-01-21 MED ORDER — FUROSEMIDE 10 MG/ML IJ SOLN
40.0000 mg | Freq: Once | INTRAMUSCULAR | Status: AC
  Administered 2017-01-21: 40 mg via INTRAVENOUS
  Filled 2017-01-21: qty 4

## 2017-01-21 MED ORDER — SODIUM CHLORIDE 0.9 % IV SOLN
INTRAVENOUS | Status: DC
  Administered 2017-01-21: 21:00:00 via INTRAVENOUS
  Filled 2017-01-21 (×6): qty 1000

## 2017-01-21 MED ORDER — POTASSIUM CHLORIDE IN NACL 40-0.9 MEQ/L-% IV SOLN
INTRAVENOUS | Status: DC
  Administered 2017-01-21: 125 mL/h via INTRAVENOUS
  Filled 2017-01-21: qty 1000

## 2017-01-21 MED ORDER — CHLORHEXIDINE GLUCONATE CLOTH 2 % EX PADS
6.0000 | MEDICATED_PAD | Freq: Every day | CUTANEOUS | Status: DC
  Administered 2017-01-21 – 2017-01-28 (×7): 6 via TOPICAL

## 2017-01-21 MED ORDER — SODIUM CHLORIDE 0.9 % IV SOLN: INTRAVENOUS | Status: DC

## 2017-01-21 MED ORDER — HYDROMORPHONE HCL-NACL 0.5-0.9 MG/ML-% IV SOSY
0.5000 mg | PREFILLED_SYRINGE | INTRAVENOUS | Status: DC | PRN
Start: 2017-01-21 — End: 2017-01-22

## 2017-01-21 MED ORDER — MORPHINE SULFATE (PF) 2 MG/ML IV SOLN
2.0000 mg | Freq: Once | INTRAVENOUS | Status: AC
Start: 2017-01-21 — End: 2017-01-21
  Administered 2017-01-21: 2 mg via INTRAVENOUS
  Filled 2017-01-21: qty 1

## 2017-01-21 MED ORDER — ENSURE ENLIVE PO LIQD: 237.0000 mL | Freq: Two times a day (BID) | ORAL | Status: DC

## 2017-01-21 NOTE — Progress Notes (Signed)
Jill Johnston   DOB:[birth year]   UK#:[id number]   BJS#:[id number]  Subjective: Jill Johnston is tossing in bed; she is grimacing; moans when moved; she appears acutely uncomfortable; had BM today; has not been verbal; husband in room   Objective: middle aged White woman examined in bed Vitals:   01/21/17 1440 01/21/17 1600  BP: 125/90 (!) 135/95  Pulse: 93 (!) 103  Resp: 16 17  Temp:  97.9 F (36.6 C)    Body mass index is 20.39 kg/m.  Intake/Output Summary (Last 24 hours) at 01/21/17 1747 Last data filed at 01/21/17 1639  Gross per 24 hour  Intake           1292.5 ml  Output             1300 ml  Net             -7.5 ml    .does not open eyes to touch or voice, does not follow commands  CBG (last 3)   Recent Labs  01/20/17 1213  GLUCAP 98     Labs:  Lab Results  Component Value Date   WBC 6.8 01/21/2017   HGB 11.4 (L) 01/21/2017   HCT 34.9 (L) 01/21/2017   MCV 96.7 01/21/2017   PLT 110 (L) 01/21/2017   NEUTROABS 9.7 (H) 01/13/2017    @LASTCHEMISTRY @  Urine Studies No results for input(s): UHGB, CRYS in the last 72 hours.  Invalid input(s): UACOL, UAPR, USPG, UPH, UTP, UGL, UKET, UBIL, UNIT, UROB, ULEU, UEPI, UWBC, URBC, UBAC, CAST, UCOM, BILUA  Basic Metabolic Panel:  Recent Labs Lab 01/20/17 1156 01/21/17 0524  NA 141 142  K 3.1* 3.4*  CL 95* 106  CO2 36* 29  GLUCOSE 93 130*  BUN 36* 31*  CREATININE 1.70* 1.40*  CALCIUM >15.0* 11.2*  MG  --  1.5*   GFR Estimated Creatinine Clearance: 43 mL/min (A) (by C-G formula based on SCr of 1.4 mg/dL (H)). Liver Function Tests:  Recent Labs Lab 01/20/17 1156 01/21/17 0524  AST 134* 123*  ALT 49 48  ALKPHOS 221* 174*  BILITOT 1.9* 1.2  PROT 6.9 5.7*  ALBUMIN 2.8* 2.3*   No results for input(s): LIPASE, AMYLASE in the last 168 hours.  Recent Labs Lab 01/20/17 1156 01/21/17 0930  AMMONIA 38* 29   Coagulation profile No results for input(s): INR, PROTIME in the last 168  hours.  CBC:  Recent Labs Lab 01/20/17 1156 01/21/17 0524  WBC 7.6 6.8  HGB 13.3 11.4*  HCT 41.5 34.9*  MCV 98.6 96.7  PLT 121* 110*   Cardiac Enzymes: No results for input(s): CKTOTAL, CKMB, CKMBINDEX, TROPONINI in the last 168 hours. BNP: Invalid input(s): POCBNP CBG:  Recent Labs Lab 01/20/17 1213  GLUCAP 98   D-Dimer No results for input(s): DDIMER in the last 72 hours. Hgb A1c No results for input(s): HGBA1C in the last 72 hours. Lipid Profile No results for input(s): CHOL, HDL, LDLCALC, TRIG, CHOLHDL, LDLDIRECT in the last 72 hours. Thyroid function studies No results for input(s): TSH, T4TOTAL, T3FREE, THYROIDAB in the last 72 hours.  Invalid input(s): FREET3 Anemia work up No results for input(s): VITAMINB12, FOLATE, FERRITIN, TIBC, IRON, RETICCTPCT in the last 72 hours. Microbiology Recent Results (from the past 240 hour(s))  Urine culture     Status: Abnormal   Collection Time: 01/20/17 11:43 AM  Result Value Ref Range Status   Specimen Description URINE, CATHETERIZED  Final   Special Requests Immunocompromised  Final   Culture  MULTIPLE SPECIES PRESENT, SUGGEST RECOLLECTION (A)  Final   Report Status 01/21/2017 FINAL  Final  Culture, blood (routine x 2)     Status: None (Preliminary result)   Collection Time: 01/20/17 11:56 AM  Result Value Ref Range Status   Specimen Description BLOOD PORT  Final   Special Requests   Final    BOTTLES DRAWN AEROBIC AND ANAEROBIC Blood Culture adequate volume   Culture   Final    NO GROWTH < 24 HOURS Performed at [facility], [place] [address] [place], [place] [address]    Report Status PENDING  Incomplete  Culture, blood (routine x 2)     Status: None (Preliminary result)   Collection Time: 01/20/17 12:15 PM  Result Value Ref Range Status   Specimen Description BLOOD RIGHT HAND  Final   Special Requests   Final    BOTTLES DRAWN AEROBIC AND ANAEROBIC Blood Culture adequate volume   Culture   Final    NO  GROWTH < 24 HOURS Performed at [facility], [place] [address], [place], [place] [address]    Report Status PENDING  Incomplete  MRSA PCR Screening     Status: None   Collection Time: 01/20/17  6:20 PM  Result Value Ref Range Status   MRSA by PCR NEGATIVE NEGATIVE Final    Comment:        The GeneXpert MRSA Assay (FDA approved for NASAL specimens only), is one component of a comprehensive MRSA colonization surveillance program. It is not intended to diagnose MRSA infection nor to guide or monitor treatment for MRSA infections.       Studies:  Dg Chest 2 View  Result Date: 01/20/2017 CLINICAL DATA:  Initial evaluation for acute altered mental status. EXAM: CHEST  2 VIEW COMPARISON:  Prior CT from 01/13/2017. FINDINGS: Right-sided Port-A-Cath in place. Cardiac and mediastinal silhouettes are within normal limits. Lungs are hypoinflated. No pulmonary edema or significant pleural effusion. No pneumothorax. Irregular patchy bibasilar opacities, right greater than left, which may reflect atelectasis or possibly infiltrates. Osseous metastases again noted. No acute osseus abnormality. Surgical clips overlie the left axilla. IMPRESSION: 1. Shallow lung inflation with patchy bibasilar opacities, right greater than left. Findings may reflect atelectasis or possibly infiltrates. 2. Osseous metastases. Electronically Signed   By: Jeannine Boga M.D.   On: 01/20/2017 13:07   Dg Abd 1 View  Result Date: 01/20/2017 CLINICAL DATA:  Constipation EXAM: ABDOMEN - 1 VIEW COMPARISON:  None. FINDINGS: Scattered large and small bowel gas is noted. Postsurgical changes are noted consistent cholecystectomy. No definitive renal or ureteral calculi are seen. Fecal material is noted throughout the colon consistent with a mild degree of constipation. Sclerotic changes are noted within the pelvic bones and ribcage consistent with metastatic disease. IMPRESSION: Changes consistent with bony metastatic  disease. Mild constipation is seen. Electronically Signed   By: Inez Catalina M.D.   On: 01/20/2017 17:15   Ct Head Wo Contrast  Result Date: 01/20/2017 CLINICAL DATA:  Altered mental status. Progressive confusion. Breast cancer with dural an osseous metastases. EXAM: CT HEAD WITHOUT CONTRAST TECHNIQUE: Contiguous axial images were obtained from the base of the skull through the vertex without intravenous contrast. COMPARISON:  None. FINDINGS: Brain: Dural-based calcifications are associated with known metastatic disease. Extensive white matter changes are again noted. No acute cortical infarct, hemorrhage, or mass lesion is present. Basal ganglia are stable. The insular cortex is normal. The brainstem and cerebellum are normal. There is no mass  effect. No significant extra-axial fluid collection is present. Vascular: Atherosclerotic calcifications are present within the cavernous internal carotid artery's bilaterally. There is no hyperdense vessel. Skull: Multiple osseous metastases are similar to the prior studies. Both sclerotic and lucent lesions are present. Sinuses/Orbits: The paranasal sinuses and mastoid air cells are clear. The globes and orbits are within normal limits. IMPRESSION: 1. No acute intracranial abnormality or significant interval change. 2. Dural based calcifications associated with known dural-based metastatic disease over the left convexity. 3. Osseous metastases. Electronically Signed   By: San Morelle M.D.   On: 01/20/2017 12:39   US Abdomen Complete  Result Date: 01/20/2017 CLINICAL DATA:  Altered mental status. History of breast cancer and cholecystectomy. EXAM: ABDOMEN ULTRASOUND COMPLETE COMPARISON:  Abdominal CT and PET-CT 03/16/2016. Chest CT 01/13/2017. FINDINGS: Gallbladder: Cholecystectomy. Common bile duct: Diameter: 15 mm. This appears chronic and similar to previous CT. No intraductal filling defects are seen. Liver: Multiple hypoechoic masses are seen throughout  the liver consistent with known metastatic disease. The largest lesion posteriorly in the right lobe measures 7.9 x 4.8 x 3.5 cm. IVC: No abnormality visualized. Pancreas: Visualized portion unremarkable. Spleen: Size and appearance within normal limits. Right Kidney: Length: 10.6 cm. Echogenicity within normal limits. No mass or hydronephrosis visualized. Left Kidney: Length: 11.9 cm. Echogenicity within normal limits. No mass or hydronephrosis visualized. Abdominal aorta: No aneurysm visualized. Other findings: No ascites. IMPRESSION: 1. No acute findings are seen. 2. Stable chronic extrahepatic biliary dilatation, likely physiologic status post cholecystectomy. 3. Multifocal hepatic metastatic disease as seen on previous studies. Comparison between this ultrasound and prior CT/PET CT is limited. Follow up CT should be considered to better assess for disease progression. Electronically Signed   By: Richardean Sale M.D.   On: 01/20/2017 13:51   Dg Chest Port 1 View  Result Date: 01/21/2017 CLINICAL DATA:  Altered mental status, history of breast carcinoma with known metastasis, low oxygen saturation EXAM: PORTABLE CHEST 1 VIEW COMPARISON:  Chest x-ray of 01/20/2017 FINDINGS: There is abnormal opacity at the right lung base which may reflect atelectasis and effusion. Pneumonia cannot be excluded follow-up is recommended with two-view chest x-ray if possible. The left lung appears clear. Right sided Port-A-Cath is unchanged with the tip near the expected SVC -RA junction and heart size is stable. There is some inhomogeneity of the lateral aspect of several ribs bilaterally suspicious for metastases. IMPRESSION: 1. New opacity at the right lung base. Consider atelectasis and effusion although pneumonia cannot be excluded. 2. Mottled appearance of several ribs bilaterally suspicious for bone metastases. Electronically Signed   By: Ivar Drape M.D.   On: 01/21/2017 15:29    Assessment: 51 y.o. BRCA negative  [place] woman originally from [place]  (1) status post left breast upper inner quadrant biopsy 05/17/2007 for a clinical mT2 N2, stage IIIA invasive ductal breast cancer, strongly estrogen receptor positive, weakly progesterone receptor positive, HER-2 negative, with an MIB-1 of 52%  (2) neoadjuvant chemotherapy consisted of dose dense doxorubicin and cyclophosphamide 4 followed by weekly paclitaxel 12, started 06/10/2007, completed 09/28/2007  (3) status post left modified radical mastectomy 10/27/2007 at [facility] [phone]) removed that invasive ductal carcinoma, grade 2, pT2, pN1a (downstaged to IIB), estrogen receptor strongly positive, progesterone receptor weakly positive, HER-2 not amplified by immunohistochemistry or FISH with a signals ratio of 0.94, number per cell 3.4  (4) status post adjuvant radiation to the left chest wall as well as the left supraclavicular and axillary nodal regions (50.4 gray +10 Pearline Cables  boost) given between 12/15/2007 on 01/31/2008  (5) on adjuvant tamoxifen 02/20/2008 through March 2016  METASTATIC DISEASE: March 2016, presenting with bone pain (6) staging studies April 2016 showed diffuse bony metastatic disease, intrathoracic adenopathy, and small lung lesions.             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, estrogen receptor strongly positive, progesterone receptor and HER-2 negative             (b) CA-27-29 is informative  (7) denosumab/Xgeva started April 2016, discontinued May 2018             (a) zolendronate started  11/06/2016  (8) letrozole/palbociclib started April 2016,  discontinued September 2017 with progression             (a) restaging studies September 2017 document bone, lung, and liver involvement             (b) brain MRI September 2017 c/w leptomeningeal spread, +/- parietal metastases  (9) whole brain irradiation 03/30/16 - 04/10/16 Site/dose:The whole brain was treated to 30Gy in  90factions of 3Gy.             (a) brain MRI 07/16/2016 interpreted as requiring only further follow-up             (b) brain MRI 10/22/2016 read as stable  (c) brain MRI 01/18/2017 shows progression  (10) repeat genetic [facility]/23/2016 [facility] in APC, ATM, BARD1, BMPR1A, BRCA1, BRCA2, BRIP1, CHD1, CDK4, CDKN2A, CHEK2, EPCAM (large rearrangement only),GREM1/SCG5, MLH1, MSH2, MSH6, MUTYH, NBN, PALB2, PMS2, POLD1, POLE, PTEN, RAD51C, RAD51D, SMAD4, STK11, and TP53  (11) gemcitabine started 04/24/2016,  given days 1 and 8 of each 21 day cycle             (a) day 8 cycle 1 delayed because of cytopenias--dose reduced and neupogen added; refuses neulasta/onpro             (b) CT scan of the chest 07/16/2016 shows decrease in the size of liver metastases.             (c) chest CT 10/15/2016 shows liver metastases to be stable, no lung metastases; new sternal mets?             (d) cycle 10 delayed 4 weeks because of intervening radiation             (e) cycle 11 delayed one week because of cytopenias             (f) CT scan of the chest 01/13/2017 shows disease progression in the liver--gemcitabine stopped  (12) Radiation treatment dates:11/18/16 - 12/02/16 Site/dose:c-spine treated to 30 Gy with 10 fractions of 3 Gy  (13) to start cyclophosphamide/ methotrexate/ fluorouracil 01/15/2017  (14) hypercalcemia of malignancy 01/20/2017  (15) poorly controlled pain-- on chronic narcotics    Plan:  Jahliyah's numbers are all better but she is not clearer-- it may be a few days before we see the full response as she is s/p whole brain irradiation and that magnifies the symptoms. However if she is no better by Sunday may need to consider subclinical seizures.  She is of course on keppra at present.  Her husband tells me she did not respond well to fentanyl in the past (no relief of  pain, lots of side effects). Will d/c and lean on dilaudid. She will need to be on some dose of running  narcotics to prevent withdrawal. Orders written.  I will address DNR status with him in AM [name], MD 01/21/2017  5:47 PM Medical Oncology and Hematology [facility] [address] [place], [place] [address] Tel. [phone]    Fax. [phone]

## 2017-01-21 NOTE — Progress Notes (Signed)
Initial Nutrition Assessment  DOCUMENTATION CODES:   Severe malnutrition in context of acute illness/injury  INTERVENTION:  - Will order Ensure Enlive BID, each supplement provides 350 kcal and 20 grams of protein, should pt become more alert and intakes remain very poor. - Will order daily multivitamin with minerals.  - RD will continue to monitor for needs associated with POC/GOC.   NUTRITION DIAGNOSIS:   Malnutrition related to acute illness, chronic illness, lethargy/confusion, catabolic illness, cancer and cancer related treatments as evidenced by energy intake < or equal to 50% for > or equal to 5 days, percent weight loss.  GOAL:   Patient will meet greater than or equal to 90% of their needs  MONITOR:   PO intake, Supplement acceptance, Weight trends, Labs  REASON FOR ASSESSMENT:   Malnutrition Screening Tool  ASSESSMENT:   51 y.o. female with medical history significant of estrogen positive metastatic breast cancer, diagnosed 2008, she received Gemcitabine for 7 months recently, she has metastasis to liver which has progressed, lung mets stable, brain metastasis. She had MRI brain which showed worsening nodular pachymeningeal/leptomeningeal enhancement over the left cerebral convexity consistent with progressive metastatic disease. She was brought to the hospital with worsening confusion, unable to express herself, she is bilingual ( [place], [place] speaker ) she has only been able to speak in [place]. She has not been eating. She has been very weak and fell at home. She has been having problems with constipation.  Pt seen for MST. BMI indicates normal weight. No intakes documented since admission. Pt has been confused and disordered since admission. RN and RT in with pt at this time and pt appears to be resting comfortably; did not feel it was necessary to arouse pt. Pt was discussed during rounds this AM. Palliative Care has been consulted; will monitor for note outlining  [place].   Physical assessment not done at this time with respect to pt's comfort. Per chart review, pt has lost 6 lbs (4.5% body weight) in the past 1 month and she has lost 15 lbs (10.6% body weight) in the past 3 months. Pt meets criteria for malnutrition based on weight loss. Pt has had nothing PO x2 days and suspect that intakes in the few days PTA would have met less than 50% of her estimated needs.   Medications reviewed; 10 mg rectal Dulcolax/day, 40 mg IV Lasix/day, 30 g lactulose/day, 40 mg IV Protonix/day, 1 packet Miralax/day, 10 mEq IV KCl x2 runs yesterday, 20 mEq oral KCl x1 dose yesterday, 1 tablet Senokot BID. Labs reviewed; K: 3.4 mmol/L, BUN: 31 mg/dL, creatinine: 1.4 mg/dL, Ca: 11.2 mg/dL, Alk Phos elevated, AST elevated, GFR: 43 mL/min.   IVF: NS-40 mEq KCl @ 150 mL/hr.    Diet Order:  Diet regular Room service appropriate? Yes; Fluid consistency: Thin  Skin:  Reviewed, no issues  Last BM:  PTA/unknown  Height:   Ht Readings from Last 1 Encounters:  01/20/17 '5\' 6"'$  (1.676 m)    Weight:   Wt Readings from Last 1 Encounters:  01/21/17 126 lb 5.2 oz (57.3 kg)    Ideal Body Weight:  59.09 kg  BMI:  Body mass index is 20.39 kg/m.  Estimated Nutritional Needs:   Kcal:  2005-2235 (35-39 kcal/kg)  Protein:  103-115 grams (1.8-2 grams/kg)  Fluid:  >/= 2 L/day  EDUCATION NEEDS:   No education needs identified at this time    Jarome Matin, MS, RD, LDN, CNSC Inpatient Clinical Dietitian Pager # [phone] After hours/weekend pager #  [phone]

## 2017-01-21 NOTE — Progress Notes (Signed)
PMT no charge note  Palliative consult request received. Chart reviewed, appreciate and agree with Dr Magrinat's input and recommendations.  Patient briefly seen this am, she was on bed side commode, husband was at bedside.   Will follow up with them on 01-22-17. Agree with current IV pain regimen. We will monitor disease trajectory and help guide decision making, address symptom management needs.   Full note to follow  Loistine Chance MD [facility] health palliative medicine team [phone]

## 2017-01-21 NOTE — Progress Notes (Signed)
Was called per nursing that patiwent with some agonal breathing and on NRB. Husband at bedside. S: Patient non responsive.patient with agonal breathing. Objective: general patient with some agonal breathing. Respirations: Some decreased breath sounds in the bases otherwise clear. No wheezing. Cardiovascular: Regular rate rhythm no murmurs rubs or gallops Abdomen: Soft, nontender, nondistended, positive bowel sounds Extremities: No clubbing cyanosis or edema  Assessment/plan #1 acute respiratory distress with hypoxia Patient on nonrebreather with sats are 96%. Patient with some agonal breathing. Patient just started on IV Dilaudid0.5 mg 4 times daily with a dose given that 1850. Patient also subsequently received a dose of morphine 2 mg IV 1 at 2011. Chest x-ray obtained earlier with opacity at right lung base, consider atelectasis and effusion although pneumonia cannot be excluded. Mottled appearance of several ribs bilaterally suspicious for bone metastases. Patient starting to move around and a little more alert. Breathing improving. Will monitor closely. Discussed with night coverage.  No charge.

## 2017-01-21 NOTE — Progress Notes (Signed)
PROGRESS NOTE    [name]  XBD:[id number] DOB: [birth year] DOA: 01/20/2017 PCP: [name], MD    Brief Narrative:  Jill Johnston is a 51 y.o. female with medical history significant of estrogen positive metastatic breast cancer, diagnosed 2008, she received Gemcitabine for  7 months recently , she has metastasis to liver which has progress, lung mets stable, brain metastasis, plan was to start (CMF) by her oncologist. She had MRI brain which showed Worsening nodular pachymeningeal/leptomeningeal enhancement over the left cerebral convexity consistent with progressive metastatic disease.   She was  brought today to the hospital with worsening confusion, unable to express herself, she is bilingual ( [place], [place] speaker ) she has only been able to speak in [place]. She has not been eating. She has been very weak and fell at home. She has been having problems with constipation.  She is anxious on my evaluation, asking frequently to use bathroom. Per husband no history of fever. She has only being  complaining of her chronic back pain  and ribs. They are having hard time at home controlling her pain.    Assessment & Plan:   Active Problems:   Malignant neoplasm of left breast (HCC)   Breast cancer metastasized to bone, left (HCC)   Essential hypertension   Portacath in place   HCAP (healthcare-associated pneumonia)   Sepsis ([place])   Hypercalcemia   Protein-calorie malnutrition, severe   AKI (acute kidney injury) ([place])   Constipation   Dehydration   Hypercalcemia of malignancy   Acute metabolic encephalopathy  #1 acute metabolic encephalopathy Likely secondary to hypercalcemia secondary to malignancy. Patient also does have metastatic brain disease as noted on MRI prior to hospitalization. Patient still significantly lethargic. Calcium levels trending down. Patient status post IV Zometa. Continue calcitonin twice a day. Continue IV Decadron and IV Keppra in case  patient has had a subclinical seizure. IV fluids. Add IV Lasix. Monitor. Supportive care.  #2 symptomatic hypercalcemia of malignancy Patient status post so meter 4 mg IV 1. Patient on calcitonin 200 units IM twice a day for total of 4 doses. Calcium levels trending down. Patient still significantly lethargic and minimally responsive. Continue IV fluids. Will add IV Lasix daily. Follow.  #3 HCAP/pneumonia Per chest x-ray patient with some bilateral infiltrates. Lactic acid level is at 3.5 and trending down. Blood cultures pending. MRSA PCR negative. Patient currently afebrile. Continue IV cefepime. Discontinue IV vancomycin as MRSA PCR is negative. Follow.  #4 hypokalemia Check a magnesium level. Replete.  #5 hypertension Stable.  #6 acute kidney injury Creatinine on admission was 1.7. Acute prerenal azotemia secondary to volume depletion. Improved with hydration. Monitor closely.  #7 transaminitis Likely secondary to metastases to the liver. Follow.  #8 constipation Patient obtunded and unable to currently take oral bowel regimen. We'll give a Dulcolax suppository 1.  #9 metastatic breast cancer to the brain, liver, bone Patient with progression of metastatic disease in the bone. Patient also with progression the brain with concern for possibility of subclinical seizures. Patient currently on IV Keppra and IV Decadron. Patient with a poor prognosis. Hematology/oncology following.  #10 severe protein calorie malnutrition Patient currently lethargic. Once mentation improves and tolerating oral intake may resume on nutritional supplementation.  DVT prophylaxis: Lovenox Code Status: Full Family Communication: Updated husband at bedside. Disposition Plan: Remain the step unit for now. Disposition undetermined at this time.   Consultants:   Hematology/oncology Dr.Magrinat 01/20/2017  Palliative care pending.  Procedures:   CT head 01/20/2017  Chest x-ray  01/20/2017  Abdominal ultrasound 01/20/2017  Antimicrobials:   IV cefepime 01/20/2017  IV vancomycin 01/20/2017>>>> 01/21/2017   Subjective: Patient is lethargic. Minimally responsive. Moving extremity spontaneously.  Objective: Vitals:   01/21/17 1200 01/21/17 1400 01/21/17 1404 01/21/17 1440  BP: 138/90 (!) 140/113 (!) 131/102 125/90  Pulse: 94 (!) 120 (!) 125 93  Resp: 15 19 (!) 23 16  Temp: 97.9 F (36.6 C)     TempSrc: Axillary     SpO2: 90% 96% 96% 97%  Weight:      Height:        Intake/Output Summary (Last 24 hours) at 01/21/17 1553 Last data filed at 01/21/17 1530  Gross per 24 hour  Intake           1042.5 ml  Output             1300 ml  Net           -257.5 ml   Filed Weights   01/20/17 1019 01/20/17 1720 01/21/17 0416  Weight: 56.7 kg (125 lb) 54.7 kg (120 lb 9.5 oz) 57.3 kg (126 lb 5.2 oz)    Examination:  General exam: Lethargic.   Respiratory system: Clear to auscultation anterior lung fields. Respiratory effort normal. Cardiovascular system: Tachycardia. No JVD, murmurs, rubs, gallops or clicks. No pedal edema. Gastrointestinal system: Abdomen is nondistended, soft and nontender. No organomegaly or masses felt. Normal bowel sounds heard. Central nervous system: lethargic. Minimally responsive. Moving extremities spontaneously.  Extremities: Symmetric 5 x 5 power. Skin: No rashes, lesions or ulcers Psychiatry: Unable to assess.    Data Reviewed: I have personally reviewed following labs and imaging studies  CBC:  Recent Labs Lab 01/20/17 1156 01/21/17 0524  WBC 7.6 6.8  HGB 13.3 11.4*  HCT 41.5 34.9*  MCV 98.6 96.7  PLT 121* 761*   Basic Metabolic Panel:  Recent Labs Lab 01/20/17 1156 01/21/17 0524  NA 141 142  K 3.1* 3.4*  CL 95* 106  CO2 36* 29  GLUCOSE 93 130*  BUN 36* 31*  CREATININE 1.70* 1.40*  CALCIUM >15.0* 11.2*  MG  --  1.5*   GFR: Estimated Creatinine Clearance: 43 mL/min (A) (by C-G formula based on SCr of  1.4 mg/dL (H)). Liver Function Tests:  Recent Labs Lab 01/20/17 1156 01/21/17 0524  AST 134* 123*  ALT 49 48  ALKPHOS 221* 174*  BILITOT 1.9* 1.2  PROT 6.9 5.7*  ALBUMIN 2.8* 2.3*   No results for input(s): LIPASE, AMYLASE in the last 168 hours.  Recent Labs Lab 01/20/17 1156 01/21/17 0930  AMMONIA 38* 29   Coagulation Profile: No results for input(s): INR, PROTIME in the last 168 hours. Cardiac Enzymes: No results for input(s): CKTOTAL, CKMB, CKMBINDEX, TROPONINI in the last 168 hours. BNP (last 3 results) No results for input(s): PROBNP in the last 8760 hours. HbA1C: No results for input(s): HGBA1C in the last 72 hours. CBG:  Recent Labs Lab 01/20/17 1213  GLUCAP 98   Lipid Profile: No results for input(s): CHOL, HDL, LDLCALC, TRIG, CHOLHDL, LDLDIRECT in the last 72 hours. Thyroid Function Tests: No results for input(s): TSH, T4TOTAL, FREET4, T3FREE, THYROIDAB in the last 72 hours. Anemia Panel: No results for input(s): VITAMINB12, FOLATE, FERRITIN, TIBC, IRON, RETICCTPCT in the last 72 hours. Sepsis Labs:  Recent Labs Lab 01/20/17 1207 01/20/17 1615 01/20/17 1954 01/21/17 0524  LATICACIDVEN 3.15* 2.9* 2.1* 1.6    Recent Results (from the past 240 hour(s))  Urine culture  Status: Abnormal   Collection Time: 01/20/17 11:43 AM  Result Value Ref Range Status   Specimen Description URINE, CATHETERIZED  Final   Special Requests Immunocompromised  Final   Culture MULTIPLE SPECIES PRESENT, SUGGEST RECOLLECTION (A)  Final   Report Status 01/21/2017 FINAL  Final  Culture, blood (routine x 2)     Status: None (Preliminary result)   Collection Time: 01/20/17 11:56 AM  Result Value Ref Range Status   Specimen Description BLOOD PORT  Final   Special Requests   Final    BOTTLES DRAWN AEROBIC AND ANAEROBIC Blood Culture adequate volume   Culture   Final    NO GROWTH < 24 HOURS Performed at [facility], [address] [place], [place] [address]     Report Status PENDING  Incomplete  Culture, blood (routine x 2)     Status: None (Preliminary result)   Collection Time: 01/20/17 12:15 PM  Result Value Ref Range Status   Specimen Description BLOOD RIGHT HAND  Final   Special Requests   Final    BOTTLES DRAWN AEROBIC AND ANAEROBIC Blood Culture adequate volume   Culture   Final    NO GROWTH < 24 HOURS Performed at [facility], [place] [address], [place], [place] [address]    Report Status PENDING  Incomplete  MRSA PCR Screening     Status: None   Collection Time: 01/20/17  6:20 PM  Result Value Ref Range Status   MRSA by PCR NEGATIVE NEGATIVE Final    Comment:        The GeneXpert MRSA Assay (FDA approved for NASAL specimens only), is one component of a comprehensive MRSA colonization surveillance program. It is not intended to diagnose MRSA infection nor to guide or monitor treatment for MRSA infections.          Radiology Studies: Dg Chest 2 View  Result Date: 01/20/2017 CLINICAL DATA:  Initial evaluation for acute altered mental status. EXAM: CHEST  2 VIEW COMPARISON:  Prior CT from 01/13/2017. FINDINGS: Right-sided Port-A-Cath in place. Cardiac and mediastinal silhouettes are within normal limits. Lungs are hypoinflated. No pulmonary edema or significant pleural effusion. No pneumothorax. Irregular patchy bibasilar opacities, right greater than left, which may reflect atelectasis or possibly infiltrates. Osseous metastases again noted. No acute osseus abnormality. Surgical clips overlie the left axilla. IMPRESSION: 1. Shallow lung inflation with patchy bibasilar opacities, right greater than left. Findings may reflect atelectasis or possibly infiltrates. 2. Osseous metastases. Electronically Signed   By: Jeannine Boga M.D.   On: 01/20/2017 13:07   Dg Abd 1 View  Result Date: 01/20/2017 CLINICAL DATA:  Constipation EXAM: ABDOMEN - 1 VIEW COMPARISON:  None. FINDINGS: Scattered large and small bowel gas is noted.  Postsurgical changes are noted consistent cholecystectomy. No definitive renal or ureteral calculi are seen. Fecal material is noted throughout the colon consistent with a mild degree of constipation. Sclerotic changes are noted within the pelvic bones and ribcage consistent with metastatic disease. IMPRESSION: Changes consistent with bony metastatic disease. Mild constipation is seen. Electronically Signed   By: Inez Catalina M.D.   On: 01/20/2017 17:15   Ct Head Wo Contrast  Result Date: 01/20/2017 CLINICAL DATA:  Altered mental status. Progressive confusion. Breast cancer with dural an osseous metastases. EXAM: CT HEAD WITHOUT CONTRAST TECHNIQUE: Contiguous axial images were obtained from the base of the skull through the vertex without intravenous contrast. COMPARISON:  None. FINDINGS: Brain: Dural-based calcifications are associated with known metastatic disease.  Extensive white matter changes are again noted. No acute cortical infarct, hemorrhage, or mass lesion is present. Basal ganglia are stable. The insular cortex is normal. The brainstem and cerebellum are normal. There is no mass effect. No significant extra-axial fluid collection is present. Vascular: Atherosclerotic calcifications are present within the cavernous internal carotid artery's bilaterally. There is no hyperdense vessel. Skull: Multiple osseous metastases are similar to the prior studies. Both sclerotic and lucent lesions are present. Sinuses/Orbits: The paranasal sinuses and mastoid air cells are clear. The globes and orbits are within normal limits. IMPRESSION: 1. No acute intracranial abnormality or significant interval change. 2. Dural based calcifications associated with known dural-based metastatic disease over the left convexity. 3. Osseous metastases. Electronically Signed   By: San Morelle M.D.   On: 01/20/2017 12:39   US Abdomen Complete  Result Date: 01/20/2017 CLINICAL DATA:  Altered mental status. History of  breast cancer and cholecystectomy. EXAM: ABDOMEN ULTRASOUND COMPLETE COMPARISON:  Abdominal CT and PET-CT 03/16/2016. Chest CT 01/13/2017. FINDINGS: Gallbladder: Cholecystectomy. Common bile duct: Diameter: 15 mm. This appears chronic and similar to previous CT. No intraductal filling defects are seen. Liver: Multiple hypoechoic masses are seen throughout the liver consistent with known metastatic disease. The largest lesion posteriorly in the right lobe measures 7.9 x 4.8 x 3.5 cm. IVC: No abnormality visualized. Pancreas: Visualized portion unremarkable. Spleen: Size and appearance within normal limits. Right Kidney: Length: 10.6 cm. Echogenicity within normal limits. No mass or hydronephrosis visualized. Left Kidney: Length: 11.9 cm. Echogenicity within normal limits. No mass or hydronephrosis visualized. Abdominal aorta: No aneurysm visualized. Other findings: No ascites. IMPRESSION: 1. No acute findings are seen. 2. Stable chronic extrahepatic biliary dilatation, likely physiologic status post cholecystectomy. 3. Multifocal hepatic metastatic disease as seen on previous studies. Comparison between this ultrasound and prior CT/PET CT is limited. Follow up CT should be considered to better assess for disease progression. Electronically Signed   By: Richardean Sale M.D.   On: 01/20/2017 13:51   Dg Chest Port 1 View  Result Date: 01/21/2017 CLINICAL DATA:  Altered mental status, history of breast carcinoma with known metastasis, low oxygen saturation EXAM: PORTABLE CHEST 1 VIEW COMPARISON:  Chest x-ray of 01/20/2017 FINDINGS: There is abnormal opacity at the right lung base which may reflect atelectasis and effusion. Pneumonia cannot be excluded follow-up is recommended with two-view chest x-ray if possible. The left lung appears clear. Right sided Port-A-Cath is unchanged with the tip near the expected SVC -RA junction and heart size is stable. There is some inhomogeneity of the lateral aspect of several ribs  bilaterally suspicious for metastases. IMPRESSION: 1. New opacity at the right lung base. Consider atelectasis and effusion although pneumonia cannot be excluded. 2. Mottled appearance of several ribs bilaterally suspicious for bone metastases. Electronically Signed   By: Ivar Drape M.D.   On: 01/21/2017 15:29        Scheduled Meds: . calcitonin  200 Units Intramuscular BID  . Chlorhexidine Gluconate Cloth  6 each Topical Q0600  . dexamethasone  10 mg Intravenous Q12H  . enoxaparin (LOVENOX) injection  40 mg Subcutaneous Q24H  . feeding supplement (ENSURE ENLIVE)  237 mL Oral BID BM  . fentaNYL  12.5 mcg Transdermal Q72H  . furosemide  40 mg Intravenous Daily  . lactulose  30 g Oral Daily  . multivitamin with minerals  1 tablet Oral Daily  . oxyCODONE  30 mg Oral BID  . pantoprazole (PROTONIX) IV  40 mg Intravenous QHS  .  polyethylene glycol  17 g Oral Daily  . potassium chloride  20 mEq Oral Once  . senna  1 tablet Oral BID  . sodium chloride flush  3 mL Intravenous Q12H   Continuous Infusions: . ceFEPime (MAXIPIME) IV    . levETIRAcetam Stopped (01/21/17 0543)  . sodium chloride       LOS: 1 day    Time spent: 78 mins    THOMPSON,DANIEL, MD [facility] Pager [phone]  If 7PM-7AM, please contact night-coverage [email or web address] Password [facility] 01/21/2017, 3:53 PM

## 2017-01-21 NOTE — Care Management Note (Signed)
Case Management Note  Patient Details  Name: Jill Johnston MRN: [id number] Date of Birth: [birth year]  Subjective/Objective:       Breast cancer patient with hx of recent chemo and ams             Action/Plan: Date:  January 21, 2017 Chart reviewed for concurrent status and case management needs. Will continue to follow patient progress. Discharge Planning: following for needs Expected discharge date: [id number] Velva Harman, BSN, [place], [place]  Expected Discharge Date:   (unknown)               Expected Discharge Plan:  Home/Self Care  In-House Referral:     Discharge planning Services  CM Consult  Post Acute Care Choice:    Choice offered to:     DME Arranged:    DME Agency:     HH Arranged:    [place] Agency:     Status of Service:  In process, will continue to follow  If discussed at Long Length of Stay Meetings, dates discussed:    Additional Comments:  Leeroy Cha, RN 01/21/2017, 8:57 AM

## 2017-01-22 ENCOUNTER — Ambulatory Visit: Payer: BLUE CROSS/BLUE SHIELD | Admitting: Oncology

## 2017-01-22 ENCOUNTER — Other Ambulatory Visit: Payer: BLUE CROSS/BLUE SHIELD

## 2017-01-22 ENCOUNTER — Inpatient Hospital Stay (HOSPITAL_COMMUNITY): Payer: BLUE CROSS/BLUE SHIELD

## 2017-01-22 ENCOUNTER — Ambulatory Visit: Payer: BLUE CROSS/BLUE SHIELD

## 2017-01-22 ENCOUNTER — Inpatient Hospital Stay (HOSPITAL_COMMUNITY)
Admit: 2017-01-22 | Discharge: 2017-01-22 | Disposition: A | Discharge status: Home / Self Care | Payer: BLUE CROSS/BLUE SHIELD | Attending: Internal Medicine | Admitting: Internal Medicine

## 2017-01-22 DIAGNOSIS — R000 Tachycardia, unspecified: Secondary | ICD-10-CM

## 2017-01-22 DIAGNOSIS — G9340 Encephalopathy, unspecified: Secondary | ICD-10-CM

## 2017-01-22 LAB — CBC WITH DIFFERENTIAL/PLATELET
BASOS ABS: 0 10*3/uL (ref 0.0–0.1)
BASOS PCT: 0 %
EOS ABS: 0 10*3/uL (ref 0.0–0.7)
Eosinophils Relative: 0 %
HCT: 38.5 % (ref 36.0–46.0)
Hemoglobin: 11.9 g/dL — ABNORMAL LOW (ref 12.0–15.0)
Lymphocytes Relative: 5 %
Lymphs Abs: 0.7 10*3/uL (ref 0.7–4.0)
MCH: 30.4 pg (ref 26.0–34.0)
MCHC: 30.9 g/dL (ref 30.0–36.0)
MCV: 98.2 fL (ref 78.0–100.0)
MONO ABS: 0.7 10*3/uL (ref 0.1–1.0)
MONOS PCT: 5 %
NEUTROS PCT: 90 %
Neutro Abs: 13.2 10*3/uL — ABNORMAL HIGH (ref 1.7–7.7)
Platelets: 142 10*3/uL — ABNORMAL LOW (ref 150–400)
RBC: 3.92 MIL/uL (ref 3.87–5.11)
RDW: 18.1 % — AB (ref 11.5–15.5)
WBC: 14.6 10*3/uL — ABNORMAL HIGH (ref 4.0–10.5)

## 2017-01-22 LAB — MAGNESIUM: MAGNESIUM: 2.5 mg/dL — AB (ref 1.7–2.4)

## 2017-01-22 LAB — COMPREHENSIVE METABOLIC PANEL
ALK PHOS: 165 U/L — AB (ref 38–126)
ALT: 47 U/L (ref 14–54)
ANION GAP: 9 (ref 5–15)
AST: 105 U/L — ABNORMAL HIGH (ref 15–41)
Albumin: 2.4 g/dL — ABNORMAL LOW (ref 3.5–5.0)
BILIRUBIN TOTAL: 0.8 mg/dL (ref 0.3–1.2)
BUN: 38 mg/dL — ABNORMAL HIGH (ref 6–20)
CALCIUM: 9.6 mg/dL (ref 8.9–10.3)
CO2: 31 mmol/L (ref 22–32)
CREATININE: 1.31 mg/dL — AB (ref 0.44–1.00)
Chloride: 107 mmol/L (ref 101–111)
GFR calc non Af Amer: 46 mL/min — ABNORMAL LOW (ref >60)
GFR, EST AFRICAN AMERICAN: 54 mL/min — AB (ref >60)
GLUCOSE: 154 mg/dL — AB (ref 65–99)
Potassium: 3.2 mmol/L — ABNORMAL LOW (ref 3.5–5.1)
SODIUM: 147 mmol/L — AB (ref 135–145)
TOTAL PROTEIN: 5.8 g/dL — AB (ref 6.5–8.1)

## 2017-01-22 MED ORDER — LORAZEPAM 2 MG/ML IJ SOLN: 0.2500 mg | Freq: Two times a day (BID) | INTRAMUSCULAR | Status: DC | PRN

## 2017-01-22 MED ORDER — KCL IN DEXTROSE-NACL 40-5-0.45 MEQ/L-%-% IV SOLN
INTRAVENOUS | Status: DC
  Administered 2017-01-22 – 2017-01-25 (×7): via INTRAVENOUS
  Filled 2017-01-22 (×7): qty 1000

## 2017-01-22 MED ORDER — MORPHINE SULFATE (PF) 2 MG/ML IV SOLN
0.5000 mg | INTRAVENOUS | Status: DC | PRN
  Administered 2017-01-22 – 2017-01-23 (×3): 0.5 mg via INTRAVENOUS
  Filled 2017-01-22 (×3): qty 1

## 2017-01-22 MED ORDER — MORPHINE SULFATE (PF) 2 MG/ML IV SOLN
2.0000 mg | INTRAVENOUS | Status: DC | PRN
  Administered 2017-01-22 (×2): 2 mg via INTRAVENOUS
  Filled 2017-01-22 (×2): qty 1

## 2017-01-22 MED ORDER — LORAZEPAM 2 MG/ML IJ SOLN
0.2500 mg | Freq: Two times a day (BID) | INTRAMUSCULAR | Status: DC | PRN
  Administered 2017-01-23 – 2017-01-24 (×3): 0.25 mg via INTRAVENOUS
  Filled 2017-01-22 (×3): qty 1

## 2017-01-22 MED ORDER — BISACODYL 10 MG RE SUPP
10.0000 mg | Freq: Once | RECTAL | Status: AC
  Administered 2017-01-25: 10 mg via RECTAL
  Filled 2017-01-22 (×2): qty 1

## 2017-01-22 MED ORDER — POTASSIUM CHLORIDE 2 MEQ/ML IV SOLN: INTRAVENOUS | Status: DC

## 2017-01-22 NOTE — Procedures (Signed)
ELECTROENCEPHALOGRAM REPORT  Date of Study: 01/22/2017  Patient's Name: Jill Johnston MRN: [id number] Date of Birth: 06-28-1966  Referring Provider: Dr. Irine Seal  Clinical History: This is a 51 year old woman with brain metastasis, altered mental status.  Medications: levETIRAcetam (KEPPRA) 500 mg in sodium chloride 0.9 % 100 mL IVPB  acetaminophen (TYLENOL) tablet 650 mg  ceFEPIme (MAXIPIME) 2 g in dextrose 5 % 50 mL IVPB  dexamethasone (DECADRON) injection 10 mg  furosemide (LASIX) injection 40 mg  hydrALAZINE (APRESOLINE) injection 10 mg  LORazepam (ATIVAN) injection 0.25 mg  morphine 2 MG/ML injection 0.5 mg  multivitamin with minerals tablet 1 tablet  pantoprazole (PROTONIX) injection 40 mg  prochlorperazine (COMPAZINE) tablet 10 mg  promethazine (PHENERGAN) injection 12.5 mg   Technical Summary: A multichannel digital EEG recording measured by the international 10-20 system with electrodes applied with paste and impedances below 5000 ohms performed as portable with EKG monitoring in an unresponsive patient.  Hyperventilation and photic stimulation were not performed.  The digital EEG was referentially recorded, reformatted, and digitally filtered in a variety of bipolar and referential montages for optimal display.   Description: The patient is confused during the recording. There is no clear posterior dominant rhythm. There is a large amount of diffuse 4-5 Hz theta and 2-3 Hz delta slowing of the background. Majority of the EEG is obscured by muscle artifact over the bilateral temporal regions. Normal sleep architecture is not seen. Hyperventilation and photic stimulation were not performed. In between artifact, there were no epileptiform discharges or electrographic seizures seen.    EKG lead showed sinus tachycardia.  Impression: This limited EEG is abnormal due to moderate to severe diffuse slowing of the background.  Clinical Correlation of the above findings  indicates diffuse cerebral dysfunction that is non-specific in etiology and can be seen with hypoxic/ischemic injury, toxic/metabolic encephalopathies, neurodegenerative disorders, or medication effect. This study is limited due to muscle artifact obscuring the bilateral temporal regions throughout most of the study. The absence of epileptiform discharges does not rule out a clinical diagnosis of epilepsy.  Clinical correlation is advised.   Ellouise Newer, M.D.

## 2017-01-22 NOTE — Progress Notes (Signed)
**  Preliminary report by tech**  Bilateral lower extremity venous duplex completed. There is no evidence of deep or superficial vein thrombosis involving the right and left lower extremities. All visualized vessels appear patent and compressible. There is no evidence of Baker's cysts bilaterally.  01/22/17 8:44 AM Jill Johnston RVT

## 2017-01-22 NOTE — Progress Notes (Signed)
PROGRESS NOTE    Jill Johnston  GYI:[id number] DOB: 1965-07-04 DOA: 01/20/2017 PCP: [name], MD    Brief Narrative:  Jill Johnston is a 51 y.o. female with medical history significant of estrogen positive metastatic breast cancer, diagnosed 2008, she received Gemcitabine for  7 months recently , she has metastasis to liver which has progress, lung mets stable, brain metastasis, plan was to start ([facility]) by her oncologist. She had MRI brain which showed Worsening nodular pachymeningeal/leptomeningeal enhancement over the left cerebral convexity consistent with progressive metastatic disease.   She was  brought today to the hospital with worsening confusion, unable to express herself, she is bilingual ( [place], [place] speaker ) she has only been able to speak in [place]. She has not been eating. She has been very weak and fell at home. She has been having problems with constipation.  She is anxious on my evaluation, asking frequently to use bathroom. Per husband no history of fever. She has only being  complaining of her chronic back pain  and ribs. They are having hard time at home controlling her pain.    Assessment & Plan:   Active Problems:   Malignant neoplasm of left breast (HCC)   Breast cancer metastasized to bone, left (HCC)   Essential hypertension   Portacath in place   HCAP (healthcare-associated pneumonia)   Sepsis ([place])   Hypercalcemia   Protein-calorie malnutrition, severe   AKI (acute kidney injury) ([place])   Constipation   Dehydration   Hypercalcemia of malignancy   Acute metabolic encephalopathy   Hypoxia  #1 acute metabolic encephalopathy Likely secondary to hypercalcemia secondary to malignancy. Patient also does have metastatic brain disease as noted on MRI prior to hospitalization. Patient still significantly lethargic. IV Dilaudid as been discontinued. Change IV morphine to every 4 hours as needed. Decrease dose of Ativan to 0.25 mg twice a day  when necessary agitation. Calcium levels trending down. Check a EEG. Continue calcitonin twice a day. Continue IV Decadron and IV Keppra in case patient has had a subclinical seizure. IV fluids. Continue IV Lasix. Monitor. Supportive care.  #2 symptomatic hypercalcemia of malignancy Patient status post zometa 4 mg IV 1. Patient on calcitonin 200 units IM twice a day for total of 4 doses. Calcium levels trending down. Patient still significantly lethargic and minimally responsive. Continue gentle IV fluids. Continue IV Lasix daily. Follow.  #3 HCAP/pneumonia Per chest x-ray patient with some bilateral infiltrates. Lactic acid level is at 3.5 and trending down. Blood cultures pending. MRSA PCR negative. Patient currently afebrile. Continue IV cefepime. Discontinued IV vancomycin as MRSA PCR is negative. Follow.  #4 hypokalemia Magnesium level of 2.5. Replete.  #5 hypertension Stable.  #6 acute kidney injury Creatinine on admission was 1.7. Acute prerenal azotemia secondary to volume depletion. Improved with hydration. Monitor closely.  #7 transaminitis Likely secondary to metastases to the liver. Follow.  #8 constipation Patient obtunded and unable to currently take oral bowel regimen. Continue Dulcolax suppository 1.  #9 metastatic breast cancer to the brain, liver, bone Patient with progression of metastatic disease in the bone. Patient also with progression the brain with concern for possibility of subclinical seizures. Patient currently on IV Keppra and IV Decadron. Patient with a poor prognosis. Hematology/oncology following.  #10 severe protein calorie malnutrition Patient currently lethargic. Once mentation improves and tolerating oral intake may resume on nutritional supplementation.  #11 hypernatremia Place on D5 half-normal saline and monitor closely.  DVT prophylaxis: Lovenox Code Status: Full Family Communication:  Updated husband at bedside. Disposition Plan: Remain in  the step unit for now. Disposition undetermined at this time.   Consultants:   Hematology/oncology Dr.Magrinat 01/20/2017  Palliative care pending.  Procedures:   CT head 01/20/2017  Chest x-ray 01/20/2017  Abdominal ultrasound 01/20/2017  EEG pending  Antimicrobials:   IV cefepime 01/20/2017  IV vancomycin 01/20/2017>>>> 01/21/2017   Subjective: Events overnight noted. Patient is lethargic. Moving extremity spontaneously. Per husband patient received some Ativan this morning as well as some morphine.  Objective: Vitals:   01/22/17 0300 01/22/17 0500 01/22/17 0600 01/22/17 0800  BP:   131/86   Pulse: (!) 102 (!) 117 97   Resp: (!) 24 16 (!) 22   Temp:    97.8 F (36.6 C)  TempSrc:    Axillary  SpO2: 94% 95% 96%   Weight:      Height:        Intake/Output Summary (Last 24 hours) at 01/22/17 0900 Last data filed at 01/22/17 0000  Gross per 24 hour  Intake           1017.5 ml  Output             3100 ml  Net          -2082.5 ml   Filed Weights   01/20/17 1019 01/20/17 1720 01/21/17 0416  Weight: 56.7 kg (125 lb) 54.7 kg (120 lb 9.5 oz) 57.3 kg (126 lb 5.2 oz)    Examination:  General exam: Lethargic.   Respiratory system: Clear to auscultation anterior lung fields. Respiratory effort normal. Cardiovascular system: Tachycardia. No JVD, murmurs, rubs, gallops or clicks. No pedal edema. Gastrointestinal system: Abdomen is nondistended, soft and nontender. No organomegaly or masses felt. Normal bowel sounds heard. Central nervous system: lethargic. Minimally responsive. Moving extremities spontaneously.  Extremities: Symmetric 5 x 5 power. Skin: No rashes, lesions or ulcers Psychiatry: Unable to assess.    Data Reviewed: I have personally reviewed following labs and imaging studies  CBC:  Recent Labs Lab 01/20/17 1156 01/21/17 0524 01/22/17 0422  WBC 7.6 6.8 14.6*  NEUTROABS  --   --  13.2*  HGB 13.3 11.4* 11.9*  HCT 41.5 34.9* 38.5  MCV 98.6  96.7 98.2  PLT 121* 110* 867*   Basic Metabolic Panel:  Recent Labs Lab 01/20/17 1156 01/21/17 0524 01/22/17 0422  NA 141 142 147*  K 3.1* 3.4* 3.2*  CL 95* 106 107  CO2 36* 29 31  GLUCOSE 93 130* 154*  BUN 36* 31* 38*  CREATININE 1.70* 1.40* 1.31*  CALCIUM >15.0* 11.2* 9.6  MG  --  1.5* 2.5*   GFR: Estimated Creatinine Clearance: 46 mL/min (A) (by C-G formula based on SCr of 1.31 mg/dL (H)). Liver Function Tests:  Recent Labs Lab 01/20/17 1156 01/21/17 0524 01/22/17 0422  AST 134* 123* 105*  ALT 49 48 47  ALKPHOS 221* 174* 165*  BILITOT 1.9* 1.2 0.8  PROT 6.9 5.7* 5.8*  ALBUMIN 2.8* 2.3* 2.4*   No results for input(s): LIPASE, AMYLASE in the last 168 hours.  Recent Labs Lab 01/20/17 1156 01/21/17 0930  AMMONIA 38* 29   Coagulation Profile: No results for input(s): INR, PROTIME in the last 168 hours. Cardiac Enzymes: No results for input(s): CKTOTAL, CKMB, CKMBINDEX, TROPONINI in the last 168 hours. BNP (last 3 results) No results for input(s): PROBNP in the last 8760 hours. HbA1C: No results for input(s): HGBA1C in the last 72 hours. CBG:  Recent Labs Lab 01/20/17 1213  GLUCAP  98   Lipid Profile: No results for input(s): CHOL, HDL, LDLCALC, TRIG, CHOLHDL, LDLDIRECT in the last 72 hours. Thyroid Function Tests: No results for input(s): TSH, T4TOTAL, FREET4, T3FREE, THYROIDAB in the last 72 hours. Anemia Panel: No results for input(s): VITAMINB12, FOLATE, FERRITIN, TIBC, IRON, RETICCTPCT in the last 72 hours. Sepsis Labs:  Recent Labs Lab 01/20/17 1207 01/20/17 1615 01/20/17 1954 01/21/17 0524  LATICACIDVEN 3.15* 2.9* 2.1* 1.6    Recent Results (from the past 240 hour(s))  Urine culture     Status: Abnormal   Collection Time: 01/20/17 11:43 AM  Result Value Ref Range Status   Specimen Description URINE, CATHETERIZED  Final   Special Requests Immunocompromised  Final   Culture MULTIPLE SPECIES PRESENT, SUGGEST RECOLLECTION (A)  Final    Report Status 01/21/2017 FINAL  Final  Culture, blood (routine x 2)     Status: None (Preliminary result)   Collection Time: 01/20/17 11:56 AM  Result Value Ref Range Status   Specimen Description BLOOD PORT  Final   Special Requests   Final    BOTTLES DRAWN AEROBIC AND ANAEROBIC Blood Culture adequate volume   Culture   Final    NO GROWTH < 24 HOURS Performed at [facility], [place] [address] [place], [place] [address]    Report Status PENDING  Incomplete  Culture, blood (routine x 2)     Status: None (Preliminary result)   Collection Time: 01/20/17 12:15 PM  Result Value Ref Range Status   Specimen Description BLOOD RIGHT HAND  Final   Special Requests   Final    BOTTLES DRAWN AEROBIC AND ANAEROBIC Blood Culture adequate volume   Culture   Final    NO GROWTH < 24 HOURS Performed at [facility], [place] [address] [place], [place] [address]    Report Status PENDING  Incomplete  MRSA PCR Screening     Status: None   Collection Time: 01/20/17  6:20 PM  Result Value Ref Range Status   MRSA by PCR NEGATIVE NEGATIVE Final    Comment:        The GeneXpert MRSA Assay (FDA approved for NASAL specimens only), is one component of a comprehensive MRSA colonization surveillance program. It is not intended to diagnose MRSA infection nor to guide or monitor treatment for MRSA infections.          Radiology Studies: Dg Chest 2 View  Result Date: 01/20/2017 CLINICAL DATA:  Initial evaluation for acute altered mental status. EXAM: CHEST  2 VIEW COMPARISON:  Prior CT from 01/13/2017. FINDINGS: Right-sided Port-A-Cath in place. Cardiac and mediastinal silhouettes are within normal limits. Lungs are hypoinflated. No pulmonary edema or significant pleural effusion. No pneumothorax. Irregular patchy bibasilar opacities, right greater than left, which may reflect atelectasis or possibly infiltrates. Osseous metastases again noted. No acute osseus abnormality. Surgical clips overlie  the left axilla. IMPRESSION: 1. Shallow lung inflation with patchy bibasilar opacities, right greater than left. Findings may reflect atelectasis or possibly infiltrates. 2. Osseous metastases. Electronically Signed   By: Jeannine Boga M.D.   On: 01/20/2017 13:07   Dg Abd 1 View  Result Date: 01/20/2017 CLINICAL DATA:  Constipation EXAM: ABDOMEN - 1 VIEW COMPARISON:  None. FINDINGS: Scattered large and small bowel gas is noted. Postsurgical changes are noted consistent cholecystectomy. No definitive renal or ureteral calculi are seen. Fecal material is noted throughout the colon consistent with a mild degree of constipation. Sclerotic changes are noted within the pelvic bones and  ribcage consistent with metastatic disease. IMPRESSION: Changes consistent with bony metastatic disease. Mild constipation is seen. Electronically Signed   By: Inez Catalina M.D.   On: 01/20/2017 17:15   Ct Head Wo Contrast  Result Date: 01/20/2017 CLINICAL DATA:  Altered mental status. Progressive confusion. Breast cancer with dural an osseous metastases. EXAM: CT HEAD WITHOUT CONTRAST TECHNIQUE: Contiguous axial images were obtained from the base of the skull through the vertex without intravenous contrast. COMPARISON:  None. FINDINGS: Brain: Dural-based calcifications are associated with known metastatic disease. Extensive white matter changes are again noted. No acute cortical infarct, hemorrhage, or mass lesion is present. Basal ganglia are stable. The insular cortex is normal. The brainstem and cerebellum are normal. There is no mass effect. No significant extra-axial fluid collection is present. Vascular: Atherosclerotic calcifications are present within the cavernous internal carotid artery's bilaterally. There is no hyperdense vessel. Skull: Multiple osseous metastases are similar to the prior studies. Both sclerotic and lucent lesions are present. Sinuses/Orbits: The paranasal sinuses and mastoid air cells are clear.  The globes and orbits are within normal limits. IMPRESSION: 1. No acute intracranial abnormality or significant interval change. 2. Dural based calcifications associated with known dural-based metastatic disease over the left convexity. 3. Osseous metastases. Electronically Signed   By: San Morelle M.D.   On: 01/20/2017 12:39   US Abdomen Complete  Result Date: 01/20/2017 CLINICAL DATA:  Altered mental status. History of breast cancer and cholecystectomy. EXAM: ABDOMEN ULTRASOUND COMPLETE COMPARISON:  Abdominal CT and PET-CT 03/16/2016. Chest CT 01/13/2017. FINDINGS: Gallbladder: Cholecystectomy. Common bile duct: Diameter: 15 mm. This appears chronic and similar to previous CT. No intraductal filling defects are seen. Liver: Multiple hypoechoic masses are seen throughout the liver consistent with known metastatic disease. The largest lesion posteriorly in the right lobe measures 7.9 x 4.8 x 3.5 cm. IVC: No abnormality visualized. Pancreas: Visualized portion unremarkable. Spleen: Size and appearance within normal limits. Right Kidney: Length: 10.6 cm. Echogenicity within normal limits. No mass or hydronephrosis visualized. Left Kidney: Length: 11.9 cm. Echogenicity within normal limits. No mass or hydronephrosis visualized. Abdominal aorta: No aneurysm visualized. Other findings: No ascites. IMPRESSION: 1. No acute findings are seen. 2. Stable chronic extrahepatic biliary dilatation, likely physiologic status post cholecystectomy. 3. Multifocal hepatic metastatic disease as seen on previous studies. Comparison between this ultrasound and prior CT/PET CT is limited. Follow up CT should be considered to better assess for disease progression. Electronically Signed   By: Richardean Sale M.D.   On: 01/20/2017 13:51   Dg Chest Port 1 View  Result Date: 01/22/2017 CLINICAL DATA:  Hypoxia EXAM: PORTABLE CHEST 1 VIEW COMPARISON:  01/21/2017 FINDINGS: Right Port-A-Cath remains in place, unchanged. Heart is  normal size. Improving aeration at the right base with decreasing airspace opacity. Increasing left basilar atelectasis or infiltrate and possible small left effusion. IMPRESSION: Decreasing right basilar airspace opacity. Worsening left base atelectasis or infiltrate with small left effusion. Electronically Signed   By: Rolm Baptise M.D.   On: 01/22/2017 07:09   Dg Chest Port 1 View  Result Date: 01/21/2017 CLINICAL DATA:  Altered mental status, history of breast carcinoma with known metastasis, low oxygen saturation EXAM: PORTABLE CHEST 1 VIEW COMPARISON:  Chest x-ray of 01/20/2017 FINDINGS: There is abnormal opacity at the right lung base which may reflect atelectasis and effusion. Pneumonia cannot be excluded follow-up is recommended with two-view chest x-ray if possible. The left lung appears clear. Right sided Port-A-Cath is unchanged with the tip near the  expected SVC -RA junction and heart size is stable. There is some inhomogeneity of the lateral aspect of several ribs bilaterally suspicious for metastases. IMPRESSION: 1. New opacity at the right lung base. Consider atelectasis and effusion although pneumonia cannot be excluded. 2. Mottled appearance of several ribs bilaterally suspicious for bone metastases. Electronically Signed   By: Ivar Drape M.D.   On: 01/21/2017 15:29        Scheduled Meds: . Chlorhexidine Gluconate Cloth  6 each Topical Q0600  . dexamethasone  10 mg Intravenous Q12H  . enoxaparin (LOVENOX) injection  40 mg Subcutaneous Q24H  . feeding supplement (ENSURE ENLIVE)  237 mL Oral BID BM  . furosemide  40 mg Intravenous Daily  . lactulose  30 g Oral Daily  . multivitamin with minerals  1 tablet Oral Daily  . oxyCODONE  30 mg Oral BID  . pantoprazole (PROTONIX) IV  40 mg Intravenous QHS  . polyethylene glycol  17 g Oral Daily  . potassium chloride  20 mEq Oral Once  . senna  1 tablet Oral BID  . sodium chloride flush  3 mL Intravenous Q12H   Continuous  Infusions: . ceFEPime (MAXIPIME) IV Stopped (01/21/17 1708)  . dextrose 5 % and 0.45 % NaCl with KCl 40 mEq/L    . levETIRAcetam Stopped (01/22/17 0441)  . 0.9 % sodium chloride with kcl 50 mL/hr at 01/21/17 2100  . sodium chloride       LOS: 2 days    Time spent: 40 mins    THOMPSON,DANIEL, MD [facility] Pager [phone]  If 7PM-7AM, please contact night-coverage [email or web address] Password [email or web address] 01/22/2017, 9:00 AM

## 2017-01-22 NOTE — Progress Notes (Signed)
   01/22/17 1200  Clinical Encounter Type  Visited With Family  Visit Type Initial;Psychological support;Spiritual support;Critical Care  Referral From Chaplain  Consult/Referral To Chaplain  Spiritual Encounters  Spiritual Needs Emotional;Other (Comment) (Pastoral Conversation/Support)  Stress Factors  Patient Stress Factors Not reviewed  Family Stress Factors Health changes;Major life changes   I visited with the patient and her husband per referral from the [place], Lorrin Jackson, who had experience with them.  The patient was not awake during my visit and was not able to be roused. The patient's husband was at the bedside and stated that his main stressor is that the patient will not wake up and have a conversation with anyone.  The patient's husband states that he has good support; but is worried about the patient's condition. He stated that he was unsure about what has been causing her to get sicker and is wanting the medical team to find out what is going on.   Please, contact Spiritual Care for further assistance.   [place] M.Div.

## 2017-01-22 NOTE — Progress Notes (Signed)
Jill Johnston   DOB:05/14/1966   NA#:[id number]   RKY#:[id number]  Subjective: Jill Johnston is sleeping deeply. She does not respond to touch or voice. She appears comfortable. Husband in room   Objective: middle aged White woman examined in bed Vitals:   01/22/17 0600 01/22/17 0800  BP: 131/86 (!) 141/87  Pulse: 97 (!) 105  Resp: (!) 22 16  Temp:  97.8 F (36.6 C)    Body mass index is 20.39 kg/m.  Intake/Output Summary (Last 24 hours) at 01/22/17 1346 Last data filed at 01/22/17 0000  Gross per 24 hour  Intake              715 ml  Output             3100 ml  Net            -2385 ml   Lungs no rales or rhonchi-- auscultated anterolaterally Heart RRR abd soft, +BS   CBG (last 3)   Recent Labs  01/20/17 1213  GLUCAP 98     Labs:  Lab Results  Component Value Date   WBC 14.6 (H) 01/22/2017   HGB 11.9 (L) 01/22/2017   HCT 38.5 01/22/2017   MCV 98.2 01/22/2017   PLT 142 (L) 01/22/2017   NEUTROABS 13.2 (H) 01/22/2017    _0 @  Urine Studies No results for input(s): UHGB, CRYS in the last 72 hours.  Invalid input(s): UACOL, UAPR, USPG, UPH, UTP, UGL, UKET, UBIL, UNIT, UROB, ULEU, UEPI, UWBC, [place], [place], [place], [place], [place]  Basic Metabolic Panel:  Recent Labs Lab 01/20/17 1156 01/21/17 0524 01/22/17 0422  NA 141 142 147*  K 3.1* 3.4* 3.2*  CL 95* 106 107  CO2 36* 29 31  GLUCOSE 93 130* 154*  BUN 36* 31* 38*  CREATININE 1.70* 1.40* 1.31*  CALCIUM >15.0* 11.2* 9.6  MG  --  1.5* 2.5*   GFR Estimated Creatinine Clearance: 46 mL/min (A) (by C-G formula based on SCr of 1.31 mg/dL (H)). Liver Function Tests:  Recent Labs Lab 01/20/17 1156 01/21/17 0524 01/22/17 0422  AST 134* 123* 105*  ALT 49 48 47  ALKPHOS 221* 174* 165*  BILITOT 1.9* 1.2 0.8  PROT 6.9 5.7* 5.8*  ALBUMIN 2.8* 2.3* 2.4*   No results for input(s): LIPASE, AMYLASE in the last 168 hours.  Recent Labs Lab 01/20/17 1156 01/21/17 0930  AMMONIA 38* 29   Coagulation  profile No results for input(s): INR, PROTIME in the last 168 hours.  CBC:  Recent Labs Lab 01/20/17 1156 01/21/17 0524 01/22/17 0422  WBC 7.6 6.8 14.6*  NEUTROABS  --   --  13.2*  HGB 13.3 11.4* 11.9*  HCT 41.5 34.9* 38.5  MCV 98.6 96.7 98.2  PLT 121* 110* 142*   Cardiac Enzymes: No results for input(s): CKTOTAL, CKMB, CKMBINDEX, TROPONINI in the last 168 hours. BNP: Invalid input(s): POCBNP CBG:  Recent Labs Lab 01/20/17 1213  GLUCAP 98   D-Dimer No results for input(s): DDIMER in the last 72 hours. Hgb A1c No results for input(s): HGBA1C in the last 72 hours. Lipid Profile No results for input(s): CHOL, HDL, LDLCALC, TRIG, CHOLHDL, LDLDIRECT in the last 72 hours. Thyroid function studies No results for input(s): TSH, T4TOTAL, T3FREE, THYROIDAB in the last 72 hours.  Invalid input(s): FREET3 Anemia work up No results for input(s): VITAMINB12, FOLATE, FERRITIN, TIBC, IRON, RETICCTPCT in the last 72 hours. Microbiology Recent Results (from the past 240 hour(s))  Urine culture     Status: Abnormal  Collection Time: 01/20/17 11:43 AM  Result Value Ref Range Status   Specimen Description URINE, CATHETERIZED  Final   Special Requests Immunocompromised  Final   Culture MULTIPLE SPECIES PRESENT, SUGGEST RECOLLECTION (A)  Final   Report Status 01/21/2017 FINAL  Final  Culture, blood (routine x 2)     Status: None (Preliminary result)   Collection Time: 01/20/17 11:56 AM  Result Value Ref Range Status   Specimen Description BLOOD PORT  Final   Special Requests   Final    BOTTLES DRAWN AEROBIC AND ANAEROBIC Blood Culture adequate volume   Culture   Final    NO GROWTH 2 DAYS Performed at [facility], [address], [place], [place] [address]    Report Status PENDING  Incomplete  Culture, blood (routine x 2)     Status: None (Preliminary result)   Collection Time: 01/20/17 12:15 PM  Result Value Ref Range Status   Specimen Description BLOOD RIGHT HAND   Final   Special Requests   Final    BOTTLES DRAWN AEROBIC AND ANAEROBIC Blood Culture adequate volume   Culture   Final    NO GROWTH 2 DAYS Performed at [facility], [place] [address], [place],  [address]    Report Status PENDING  Incomplete  MRSA PCR Screening     Status: None   Collection Time: 01/20/17  6:20 PM  Result Value Ref Range Status   MRSA by PCR NEGATIVE NEGATIVE Final    Comment:        The GeneXpert MRSA Assay (FDA approved for NASAL specimens only), is one component of a comprehensive MRSA colonization surveillance program. It is not intended to diagnose MRSA infection nor to guide or monitor treatment for MRSA infections.       Studies:  Dg Abd 1 View  Result Date: 01/20/2017 CLINICAL DATA:  Constipation EXAM: ABDOMEN - 1 VIEW COMPARISON:  None. FINDINGS: Scattered large and small bowel gas is noted. Postsurgical changes are noted consistent cholecystectomy. No definitive renal or ureteral calculi are seen. Fecal material is noted throughout the colon consistent with a mild degree of constipation. Sclerotic changes are noted within the pelvic bones and ribcage consistent with metastatic disease. IMPRESSION: Changes consistent with bony metastatic disease. Mild constipation is seen. Electronically Signed   By: Inez Catalina M.D.   On: 01/20/2017 17:15   Dg Chest Port 1 View  Result Date: 01/22/2017 CLINICAL DATA:  Hypoxia EXAM: PORTABLE CHEST 1 VIEW COMPARISON:  01/21/2017 FINDINGS: Right Port-A-Cath remains in place, unchanged. Heart is normal size. Improving aeration at the right base with decreasing airspace opacity. Increasing left basilar atelectasis or infiltrate and possible small left effusion. IMPRESSION: Decreasing right basilar airspace opacity. Worsening left base atelectasis or infiltrate with small left effusion. Electronically Signed   By: Rolm Baptise M.D.   On: 01/22/2017 07:09   Dg Chest Port 1 View  Result Date: 01/21/2017 CLINICAL  DATA:  Altered mental status, history of breast carcinoma with known metastasis, low oxygen saturation EXAM: PORTABLE CHEST 1 VIEW COMPARISON:  Chest x-ray of 01/20/2017 FINDINGS: There is abnormal opacity at the right lung base which may reflect atelectasis and effusion. Pneumonia cannot be excluded follow-up is recommended with two-view chest x-ray if possible. The left lung appears clear. Right sided Port-A-Cath is unchanged with the tip near the expected SVC -RA junction and heart size is stable. There is some inhomogeneity of the lateral aspect of several ribs bilaterally suspicious for metastases. IMPRESSION:  1. New opacity at the right lung base. Consider atelectasis and effusion although pneumonia cannot be excluded. 2. Mottled appearance of several ribs bilaterally suspicious for bone metastases. Electronically Signed   By: Ivar Drape M.D.   On: 01/21/2017 15:29    Assessment: 51 y.o. BRCA negative [place] woman originally from [place]  (1) status post left breast upper inner quadrant biopsy 05/17/2007 for a clinical mT2 N2, stage IIIA invasive ductal breast cancer, strongly estrogen receptor positive, weakly progesterone receptor positive, HER-2 negative, with an MIB-1 of 52%  (2) neoadjuvant chemotherapy consisted of dose dense doxorubicin and cyclophosphamide 4 followed by weekly paclitaxel 12, started 06/10/2007, completed 09/28/2007  (3) status post left modified radical mastectomy 10/27/2007 at [facility] [phone]) removed that invasive ductal carcinoma, grade 2, pT2, pN1a (downstaged to IIB), estrogen receptor strongly positive, progesterone receptor weakly positive, HER-2 not amplified by immunohistochemistry or FISH with a signals ratio of 0.94, number per cell 3.4  (4) status post adjuvant radiation to the left chest wall as well as the left supraclavicular and axillary nodal regions (50.4 gray +10 Gray boost) given between 12/15/2007 on 01/31/2008  (5) on adjuvant tamoxifen  02/20/2008 through March 2016  METASTATIC DISEASE: March 2016, presenting with bone pain (6) staging studies April 2016 showed diffuse bony metastatic disease, intrathoracic adenopathy, and small lung lesions.             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, estrogen receptor strongly positive, progesterone receptor and HER-2 negative             (b) CA-27-29 is informative  (7) denosumab/Xgeva started April 2016, discontinued May 2018             (a) zolendronate started  11/06/2016  (8) letrozole/palbociclib started April 2016,  discontinued September 2017 with progression             (a) restaging studies September 2017 document bone, lung, and liver involvement             (b) brain MRI September 2017 c/w leptomeningeal spread, +/- parietal metastases  (9) whole brain irradiation 03/30/16 - 04/10/16 Site/dose:The whole brain was treated to 30Gy in 36factions of 3Gy.             (a) brain MRI 07/16/2016 interpreted as requiring only further follow-up             (b) brain MRI 10/22/2016 read as stable  (c) brain MRI 01/18/2017 shows progression  (10) repeat genetic [facility]/23/2016 [facility] in APC, ATM, BARD1, BMPR1A, BRCA1, BRCA2, BRIP1, CHD1, CDK4, CDKN2A, CHEK2, EPCAM (large rearrangement only),GREM1/SCG5, MLH1, MSH2, MSH6, MUTYH, NBN, PALB2, PMS2, POLD1, POLE, PTEN, RAD51C, RAD51D, SMAD4, STK11, and TP53  (11) gemcitabine started 04/24/2016,  given days 1 and 8 of each 21 day cycle             (a) day 8 cycle 1 delayed because of cytopenias--dose reduced and neupogen added; refuses neulasta/onpro             (b) CT scan of the chest 07/16/2016 shows decrease in the size of liver metastases.             (c) chest CT 10/15/2016 shows liver metastases to be stable, no lung metastases; new sternal mets?             (d) cycle 10  delayed 4 weeks because of intervening radiation             (  e) cycle 11 delayed one week because of cytopenias             (f) CT scan of the chest 01/13/2017 shows disease progression in the liver--gemcitabine stopped  (12) Radiation treatment dates:11/18/16 - 12/02/16 Site/dose:c-spine treated to 30 Gy with 10 fractions of 3 Gy  (13) to start cyclophosphamide/ methotrexate/ fluorouracil 01/15/2017  (14) hypercalcemia of malignancy 01/20/2017  (15) poorly controlled pain-- on chronic narcotics    Plan:  I reviewed EEG results with husband Legrand Como-- there is no evidence of seizure activity per that study.  I believe we are seeing a slower than normal response to correction of her hypercalcemia partly because of prior whole brain irradiation and partly because metabolism of benzodiazepines may be slow given her liver situation. Benzodiazepines have been appropriately cut back and should be used only for seizure treatment not for anxiety or sedation.  Machael and I discussed advanced directives. He is clear he does not want CPR. We discussed intubation which I recommended against and he agreed. She may receive any other interventions including bipap, cardioversion, etc. Orders written.  I have asked my partner Dr Lindi Adie to stop by 7/3 as I will be out all next week.   Nazia has an appointment with me 8/4 at 4:30 PM. We can discuss where to go from here at that time.  Appreciate your care of this patient and her family! [name], MD 01/22/2017  1:46 PM Medical Oncology and Hematology [facility] [address] [place], [place] [address] Tel. [phone]    Fax. [phone]

## 2017-01-22 NOTE — Progress Notes (Signed)
EEG completed; results pending.    

## 2017-01-22 NOTE — Progress Notes (Signed)
Pharmacy Antibiotic Note  Jill Johnston is a 51 y.o. female with metastatic breast cancer currently undergoing chemotherapy treatment presented to the ED on 01/20/2017 with c/o AMS.  To start vancomycin for suspected PNA. Vanc d/c'd 7/26 due to negative MRSA PCR  - 7/25 CXR: Shallow lung inflation with patchy bibasilar opacities, right greater than left. Findings may reflect atelectasis or possibly infiltrates.  Plan: Day 3 Abx's 1) Continue current cefepime dosine of 2g IV q24 per current renal function 2) Continue to monitor renal function and cultures  ____________________________  Height: 5\' 6"  (167.6 cm) Weight: 126 lb 5.2 oz (57.3 kg) IBW/kg (Calculated) : 59.3  Temp (24hrs), Avg:97.5 F (36.4 C), Min:96.6 F (35.9 C), Max:97.9 F (36.6 C)   Recent Labs Lab 01/20/17 1156 01/20/17 1207 01/20/17 1615 01/20/17 1954 01/21/17 0524 01/22/17 0422  WBC 7.6  --   --   --  6.8 14.6*  CREATININE 1.70*  --   --   --  1.40* 1.31*  LATICACIDVEN  --  3.15* 2.9* 2.1* 1.6  --     Estimated Creatinine Clearance: 46 mL/min (A) (by C-G formula based on SCr of 1.31 mg/dL (H)).    No Known Allergies   Thank you for allowing pharmacy to be a part of this patient's care.   Adrian Saran, PharmD, BCPS Pager [phone] 01/22/2017 12:50 PM

## 2017-01-22 NOTE — Progress Notes (Signed)
Follow-up: Ask by my colleague to re-eval pt s/p report of agonal type respirations just after change of shift. (I rec'd call as well and ordered morphine 2 mg IV). Dr Grandville Silos saw pt at bedside and felt pt was improving s/p IV morphine. Pt noted to have gradual increased 02 demand through-out the day and remains on NRB at 15L w/ 02 sats of 96%. At bedside pt noted resting in NAD. She is quite somnolent but had just rec'd a dose of IV Dilaudid. She is no longer w/ agonal breathing. BBS diminished. RR-14-16. Remaining VSS. When NRB removed by RN earlier for mouth care sats quickly decrease to low 90's. Husband is at bedside and is very concerned regarding pt's gradual change in respiratory status today though admits her breathing is somewhat improved from earlier this evening. RN states she feels IV morphine helps pt overall somewhat more effectively than IV dilaudid.  Assessment/Plan: 1. Acute respiratory distress with hypoxia: Improved from earlier tonight. Continues to require NRB at 15L w/ minimal 02 reserve. Will d/c IV dilaudid and add IV morphine 2 mg q2h. Will defer further changes for now pending plan for Dr Jana Hakim to discuss code status w/ husband tomorrow. RN to notify for any acute changes in pt's status. Will continue to monitor closely in SDU.   Jeryl Columbia, NP-C [facility] Pager [phone]

## 2017-01-22 NOTE — Consult Note (Signed)
Consultation Note Date: 01/22/2017   Patient Name: Jill Johnston  DOB: [birth year]  MRN: [id number]  Age / Sex: 51 y.o., female  PCP: Magrinat, Virgie Dad, MD Referring Physician: Eugenie Filler, MD  Reason for Consultation: Establishing goals of care  HPI/Patient Profile: 51 y.o. female  with past medical history of   admitted on 01/20/2017 with  .  51 y.o.femalewith medical history significant of estrogen positive metastatic breast cancer, diagnosed 2008, she received Gemcitabine for 7 months recently , she has metastasis to liver which has progress, lung mets stable, brain metastasis, plan was to start (CMF) by her oncologist. She had MRI brain which showed Worsening nodular pachymeningeal/leptomeningeal enhancement over the left cerebral convexity consistent with progressive metastatic disease.   A palliative consult has been requested for goals of care discussions.   Clinical Assessment and Goals of Care:  Patient is restless, she is undergoing EEG, she asks about going to the bathroom. Call placed and husband arrived by the bedside shortly thereafter. I introduced myself and palliative care as follows:  Palliative medicine is specialized medical care for people living with serious illness. It focuses on providing relief from the symptoms and stress of a serious illness. The goal is to improve quality of life for both the patient and the family.  Patient has since continued to decline, her cancer has progressed. Husband talks about how the patient has rather acutely declined, she became more and more confused at home, how ever, she has been declining at home, has had weight loss for few weeks. We discussed about cancer trajectory in detail.   We discussed about appropriate symptom management and following disease trajectory. See discussions below, thank you for the consult.   NEXT OF KIN   husband Has a 65 year old son.   SUMMARY OF RECOMMENDATIONS    1. Full code for now, I have had long discussions with husband Charmian Forbis, he is fully aware of the scope of a resuscitative attempt, recognizes that CPR and artifical tubes and lines at end of life may cause more harm than good, he is simply very tearful and unable to recommend we establish DNR DNI. We will continue to support him and continue this conversation.  2. Continue current pain and non pain regimen, patient's husband wishes to minimize use of opioids and benzodiazepines.  3. Initial discussions about hospice philosophy of care, home with hospice versus residential hospice undertaken with patient's husband Legrand Como. We will continue to follow disease trajectory and help guide decision making.    Code Status/Advance Care Planning:  Full code    Symptom Management:    see above   Palliative Prophylaxis:   Delirium Protocol  Additional Recommendations (Limitations, Scope, Preferences):  Full Scope Treatment  Psycho-social/Spiritual:   Desire for further Chaplaincy support:no  Additional Recommendations: Education on Hospice  Prognosis:   guarded  Discharge Planning: To Be Determined      Primary Diagnoses: Present on Admission: . Sepsis ([place]) . Hypercalcemia . Malignant neoplasm of left breast ([place]) .  Breast cancer metastasized to bone, left ([place]) . Essential hypertension . HCAP (healthcare-associated pneumonia)   I have reviewed the medical record, interviewed the patient and family, and examined the patient. The following aspects are pertinent.  Past Medical History:  Diagnosis Date  . Anxiety   . Breast cancer ([place])    Stage IV, ER positive left upper outer quadrant breast cancer with metastasis to bone  . Breast cancer metastasized to multiple sites ([place]) 04/12/2016   bone, liver, lung and brain  . GERD (gastroesophageal reflux disease)   . History of radiation therapy 11/18/16 -  12/02/16  . Hypertension    Social History   Social History  . Marital status: Married    Spouse name: N/A  . Number of children: N/A  . Years of education: N/A   Social History Main Topics  . Smoking status: Never Smoker  . Smokeless tobacco: Never Used  . Alcohol use No  . Drug use: No  . Sexual activity: Yes   Other Topics Concern  . None   Social History Narrative  . None   Family History  Problem Relation Age of Onset  . Heart attack Mother        Died age 70  . Heart disease Father        Died age 72  . Brain cancer Father        pt not sure if he actually had this  . Cancer Maternal Grandmother        dx. NOS cancer at older age; d. 20  . Heart attack Maternal Grandfather 79  . Colon cancer Paternal Grandmother        d. 37s   Scheduled Meds: . bisacodyl  10 mg Rectal Once  . Chlorhexidine Gluconate Cloth  6 each Topical Q0600  . dexamethasone  10 mg Intravenous Q12H  . enoxaparin (LOVENOX) injection  40 mg Subcutaneous Q24H  . feeding supplement (ENSURE ENLIVE)  237 mL Oral BID BM  . furosemide  40 mg Intravenous Daily  . lactulose  30 g Oral Daily  . multivitamin with minerals  1 tablet Oral Daily  . pantoprazole (PROTONIX) IV  40 mg Intravenous QHS  . polyethylene glycol  17 g Oral Daily  . potassium chloride  20 mEq Oral Once  . senna  1 tablet Oral BID  . sodium chloride flush  3 mL Intravenous Q12H   Continuous Infusions: . ceFEPime (MAXIPIME) IV Stopped (01/21/17 1708)  . dextrose 5 % and 0.45 % NaCl with KCl 40 mEq/L 100 mL/hr at 01/22/17 0923  . levETIRAcetam Stopped (01/22/17 0441)  . 0.9 % sodium chloride with kcl 50 mL/hr at 01/21/17 2100  . sodium chloride     PRN Meds:.acetaminophen **OR** acetaminophen, bisacodyl, hydrALAZINE, LORazepam, morphine injection, ondansetron **OR** ondansetron (ZOFRAN) IV, prochlorperazine, promethazine Medications Prior to Admission:  Prior to Admission medications   Medication Sig Start Date End Date  Taking? Authorizing Provider  docusate sodium (COLACE) 100 MG capsule Take 100 mg by mouth daily.    Yes [provider]  lidocaine-prilocaine (EMLA) cream Apply 1 application topically as needed. To port 1 hour before going to be accessed with needle. Cover with plastic wrap. 04/17/16  Yes Livesay, Lennis P, MD  oxyCODONE (OXYCONTIN) 30 MG 12 hr tablet Take 30 mg by mouth 2 (two) times daily. 01/14/17  Yes Magrinat, Virgie Dad, MD  Oxycodone HCl 10 MG TABS Take 1 tablet (10 mg total) by mouth every 3 (three) hours as  needed. Patient taking differently: Take 10 mg by mouth every 3 (three) hours as needed (For pain.).  01/14/17  Yes Magrinat, Virgie Dad, MD  pantoprazole (PROTONIX) 40 MG tablet TAKE 1 TABLET(40 MG) BY MOUTH TWICE DAILY BEFORE A MEAL 05/18/16  Yes Setzer, Terri L, NP  polyethylene glycol (MIRALAX / GLYCOLAX) packet Take 17 grams by mouth every day. 06/19/16  Yes Magrinat, Virgie Dad, MD  prochlorperazine (COMPAZINE) 10 MG tablet Take 1 tablet (10 mg total) by mouth every 6 (six) hours as needed (Nausea or vomiting). 01/14/17  Yes Magrinat, Virgie Dad, MD  dexamethasone (DECADRON) 4 MG tablet Take 2 tablets (8 mg total) by mouth daily. Start the day after chemotherapy for 2 days. Take with food. Patient not taking: Reported on 01/20/2017 01/14/17   Magrinat, Virgie Dad, MD  HYDROcodone-acetaminophen (HYCET) 7.5-325 mg/15 ml solution Take 15 mLs by mouth 4 (four) times daily as needed for moderate pain. Patient not taking: Reported on 01/20/2017 11/19/16 11/19/17  [name], MD  lisinopril (PRINIVIL,ZESTRIL) 10 MG tablet Take 1 tablet (10 mg total) by mouth daily. Patient not taking: Reported on 10/26/2016 01/23/16   Gordy Levan, MD   No Known Allergies Review of Systems +restlessness  Physical Exam Lethargic S1 S2 Abdomen soft Not awake not alert Restless at times Shallow breathing No edema Thin muscle wasting  Vital Signs: BP (!) 141/87   Pulse (!) 105   Temp 97.8 F  (36.6 C) (Axillary)   Resp 16   Ht 5\' 6"  (1.676 m)   Wt 57.3 kg (126 lb 5.2 oz)   SpO2 95%   BMI 20.39 kg/m  Pain Assessment: PAINAD   Pain Score: 0-No pain   SpO2: SpO2: 95 % O2 Device:SpO2: 95 % O2 Flow Rate: .O2 Flow Rate (L/min): 6 L/min  IO: Intake/output summary:  Intake/Output Summary (Last 24 hours) at 01/22/17 1220 Last data filed at 01/22/17 0000  Gross per 24 hour  Intake              715 ml  Output             3100 ml  Net            -2385 ml    LBM: Last BM Date: 01/21/17 Baseline Weight: Weight: 56.7 kg (125 lb) Most recent weight: Weight: 57.3 kg (126 lb 5.2 oz)     Palliative Assessment/Data:   Flowsheet Rows     Most Recent Value  Intake Tab  Referral Department  Hospitalist  Unit at Time of Referral  Intermediate Care Unit  Palliative Care Primary Diagnosis  Cancer  Palliative Care Type  New Palliative care  Reason for referral  Pain, Non-pain Symptom, Counsel Regarding Hospice, Psychosocial or Spiritual support, Clarify Goals of Care, End of Life Care Assistance  Date first seen by Palliative Care  01/22/17  Clinical Assessment  Palliative Performance Scale Score  20%  Pain Max last 24 hours  5  Pain Min Last 24 hours  4  Dyspnea Max Last 24 Hours  5  Dyspnea Min Last 24 hours  4  Psychosocial & Spiritual Assessment  Palliative Care Outcomes  Patient/Family meeting held?  Yes  Who was at the meeting?  patient's husband.   Palliative Care Outcomes  Clarified goals of care      Time In:  11 Time Out:  12.10 Time Total:  70 min  Greater than 50%  of this time was spent counseling and coordinating care related to the  above assessment and plan.  Signed by: Loistine Chance, MD  [phone]  Please contact Palliative Medicine Team phone at [phone] for questions and concerns.  For individual provider: See Shea Evans

## 2017-01-23 DIAGNOSIS — N179 Acute kidney failure, unspecified: Secondary | ICD-10-CM

## 2017-01-23 LAB — CBC WITH DIFFERENTIAL/PLATELET
BASOS PCT: 0 %
Basophils Absolute: 0 10*3/uL (ref 0.0–0.1)
Eosinophils Absolute: 0 10*3/uL (ref 0.0–0.7)
Eosinophils Relative: 0 %
HEMATOCRIT: 36.2 % (ref 36.0–46.0)
HEMOGLOBIN: 11.3 g/dL — AB (ref 12.0–15.0)
LYMPHS PCT: 6 %
Lymphs Abs: 1 10*3/uL (ref 0.7–4.0)
MCH: 31 pg (ref 26.0–34.0)
MCHC: 31.2 g/dL (ref 30.0–36.0)
MCV: 99.5 fL (ref 78.0–100.0)
MONOS PCT: 4 %
Monocytes Absolute: 0.7 10*3/uL (ref 0.1–1.0)
NEUTROS ABS: 14.6 10*3/uL — AB (ref 1.7–7.7)
NEUTROS PCT: 90 %
Platelets: 134 10*3/uL — ABNORMAL LOW (ref 150–400)
RBC: 3.64 MIL/uL — ABNORMAL LOW (ref 3.87–5.11)
RDW: 18.2 % — ABNORMAL HIGH (ref 11.5–15.5)
WBC: 16.2 10*3/uL — ABNORMAL HIGH (ref 4.0–10.5)

## 2017-01-23 LAB — BASIC METABOLIC PANEL
ANION GAP: 7 (ref 5–15)
BUN: 36 mg/dL — ABNORMAL HIGH (ref 6–20)
CHLORIDE: 111 mmol/L (ref 101–111)
CO2: 29 mmol/L (ref 22–32)
CREATININE: 0.98 mg/dL (ref 0.44–1.00)
Calcium: 8.9 mg/dL (ref 8.9–10.3)
GFR calc non Af Amer: >60 mL/min (ref >60)
GLUCOSE: 139 mg/dL — AB (ref 65–99)
Potassium: 3.8 mmol/L (ref 3.5–5.1)
Sodium: 147 mmol/L — ABNORMAL HIGH (ref 135–145)

## 2017-01-23 MED ORDER — MORPHINE BOLUS VIA INFUSION
0.5000 mg | INTRAVENOUS | Status: DC | PRN
  Administered 2017-01-23 – 2017-01-27 (×23): 0.5 mg via INTRAVENOUS
  Filled 2017-01-23: qty 1

## 2017-01-23 MED ORDER — LIP MEDEX EX OINT
TOPICAL_OINTMENT | CUTANEOUS | Status: AC
  Administered 2017-01-23: 15:00:00
  Filled 2017-01-23: qty 7

## 2017-01-23 MED ORDER — DEXTROSE 5 % IV SOLN
2.0000 g | Freq: Two times a day (BID) | INTRAVENOUS | Status: DC
  Administered 2017-01-23 – 2017-01-27 (×10): 2 g via INTRAVENOUS
  Filled 2017-01-23 (×11): qty 2

## 2017-01-23 MED ORDER — MORPHINE SULFATE (PF) 2 MG/ML IV SOLN
0.5000 mg | Freq: Once | INTRAVENOUS | Status: AC
  Administered 2017-01-23: 0.5 mg via INTRAVENOUS
  Filled 2017-01-23: qty 1

## 2017-01-23 MED ORDER — MORPHINE SULFATE (PF) 2 MG/ML IV SOLN
1.0000 mg | INTRAVENOUS | Status: DC | PRN
  Administered 2017-01-23 (×2): 1 mg via INTRAVENOUS
  Filled 2017-01-23 (×3): qty 1

## 2017-01-23 MED ORDER — SODIUM CHLORIDE 0.9 % IV SOLN
1.0000 mg/h | INTRAVENOUS | Status: DC
  Administered 2017-01-23: 1 mg/h via INTRAVENOUS
  Filled 2017-01-23: qty 10

## 2017-01-23 MED ORDER — SODIUM CHLORIDE 0.9 % IV SOLN
1.0000 mg/h | INTRAVENOUS | Status: DC
  Administered 2017-01-26: 0.5 mg/h via INTRAVENOUS
  Administered 2017-01-26: 5 mg/h via INTRAVENOUS
  Administered 2017-01-26: 7 mg/h via INTRAVENOUS
  Administered 2017-01-26 (×2): 0.5 mg/h via INTRAVENOUS
  Administered 2017-01-27: 1 mg/h via INTRAVENOUS
  Filled 2017-01-23 (×3): qty 10

## 2017-01-23 NOTE — Progress Notes (Signed)
PMT addendum, no charge note  Patient has reportedly been restless and uncomfortable all day, she has received PRN Morphine dosages with little to no relief.   Received phone call from Dr Jana Hakim, agree with his recommendation for the patient to receive Morphine continuous infusion.   PLAN: Morphine 1 mg/ hour drip, up titrate as needed for comfort.  Call placed into patient's room, discussed with husband Legrand Como about morphine drip, he is agreeable Call placed and also discussed with bedside RN Shanon Brow about starting Morphine infusion.   Loistine Chance MD [place] palliative medicine  [phone]

## 2017-01-23 NOTE — Progress Notes (Signed)
PROGRESS NOTE    Jill Johnston  JJO:[id number] DOB: 1965/10/16 DOA: 01/20/2017 PCP: [name], MD    Brief Narrative:  Jill Johnston is a 51 y.o. female with medical history significant of estrogen positive metastatic breast cancer, diagnosed 2008, status post treatment with Gemcitabine 7 months ago, presenting with increasing confusion.  She has metastasis to liver which has progressed, lung mets stable, brain metastasis, plan was to start (CMF) by her oncologist. She had MRI brain which showed Worsening nodular pachymeningeal/leptomeningeal enhancement over the left cerebral convexity consistent with progressive metastatic disease.   Evaluation revealed pneumonia with acute renal failure associated with hypercalcemia     Assessment & Plan:   Active Problems:   Malignant neoplasm of left breast (HCC)   Breast cancer metastasized to bone, left (HCC)   Essential hypertension   Portacath in place   HCAP (healthcare-associated pneumonia)   Sepsis ([place])   Hypercalcemia   Protein-calorie malnutrition, severe   AKI (acute kidney injury) ([place])   Constipation   Dehydration   Hypercalcemia of malignancy   Acute metabolic encephalopathy   Hypoxia  #1 acute metabolic encephalopathy Toxic metabolic etiology due to combination of hypercalcemia with renal failure as well as pneumonia.  Check a EEG 01/22/17 -diffuse cerebral dysfunction,  non-specific, limited due to muscle artifact obscuring the bilateral temporal regions throughout most of the study. Supportive care for now   #2 symptomatic hypercalcemia of malignancy Patient status post zometa 4 mg IV 1.  On calcitonin 200 units IM twice a day for total of 4 doses.  Continue saline hydration and follow clinically  #3 HCAP/pneumonia Chest x-ray- some bilateral infiltrates.   MRSA PCR negative -discontinued IV vancomycin as MRSA PCR is negative.  Continue IV antibiotics Follow-up blood cultures  #4  hypokalemia Potassium repletion  #5 hypertension Stable.  #6 acute kidney injury Creatinine on admission was 1.7.  Acute prerenal azotemia secondary to volume depletion.  IV rehydration with monitoring of electrolytes and renal function #7 transaminitis Likely secondary to metastases to the liver. Follow clinically  #8 constipation. Laxatives when necessary  #9 metastatic breast cancer to the brain, liver, bone Patient with progression of metastatic disease to the bone.  Patient also with progression the brain with concern for possibility of subclinical seizures.  On IV Keppra and IV Decadron.  Poor prognosis.  Hematology/oncology following Palliative medicine assisting with symptom mx/goals of care.  #10 severe protein calorie malnutrition Nutritional support  #11 hypernatremia Hypotonic fluid administration  DVT prophylaxis: Lovenox Code Status: Full Family Communication: Updated husband at bedside. Disposition Plan: Remain in the step unit for now. Disposition undetermined at this time.   Consultants:   Hematology/oncology Dr.Magrinat 01/20/2017  Palliative care   Procedures:   CT head 01/20/2017  Chest x-ray 01/20/2017  Abdominal ultrasound 01/20/2017  EEG 01/22/17  Antimicrobials:   IV cefepime 01/20/2017  IV vancomycin 01/20/2017>>>> 01/21/2017   Subjective: Episodes of restlessness of overnight reported  Objective: Vitals:   01/23/17 0540 01/23/17 0600 01/23/17 0800 01/23/17 0805  BP:  118/67  (!) 167/111  Pulse:  93  (!) 114  Resp:  17  15  Temp:   98.2 F (36.8 C)   TempSrc:   Oral   SpO2:  95%  93%  Weight: 56.2 kg (123 lb 14.4 oz)     Height:        Intake/Output Summary (Last 24 hours) at 01/23/17 0852 Last data filed at 01/23/17 0600  Gross per 24 hour  Intake  2370 ml  Output              590 ml  Net             1780 ml   Filed Weights   01/20/17 1720 01/21/17 0416 01/23/17 0540  Weight: 54.7 kg (120 lb 9.5  oz) 57.3 kg (126 lb 5.2 oz) 56.2 kg (123 lb 14.4 oz)    Examination:  General exam: Resting comfortably, no acute distress  Respiratory system: Clear to auscultation anterior lung fields. Respiratory effort normal. Cardiovascular system: Tachycardia. No JVD, murmurs, rubs, gallops or clicks. No pedal edema. Gastrointestinal system: Abdomen is nondistended, soft and nontender. No organomegaly or masses felt. Normal bowel sounds heard. Central nervous system: lethargic but arousable, moves all extremities Extremities: No cyanosis or edema. Skin: No rashes, lesions or ulcers Psychiatry: Unable to assess.    Data Reviewed: I have personally reviewed following labs and imaging studies  CBC:  Recent Labs Lab 01/20/17 1156 01/21/17 0524 01/22/17 0422 01/23/17 0430  WBC 7.6 6.8 14.6* 16.2*  NEUTROABS  --   --  13.2* 14.6*  HGB 13.3 11.4* 11.9* 11.3*  HCT 41.5 34.9* 38.5 36.2  MCV 98.6 96.7 98.2 99.5  PLT 121* 110* 142* 295*   Basic Metabolic Panel:  Recent Labs Lab 01/20/17 1156 01/21/17 0524 01/22/17 0422 01/23/17 0430  NA 141 142 147* 147*  K 3.1* 3.4* 3.2* 3.8  CL 95* 106 107 111  CO2 36* 29 31 29   GLUCOSE 93 130* 154* 139*  BUN 36* 31* 38* 36*  CREATININE 1.70* 1.40* 1.31* 0.98  CALCIUM >15.0* 11.2* 9.6 8.9  MG  --  1.5* 2.5*  --    GFR: Estimated Creatinine Clearance: 60.3 mL/min (by C-G formula based on SCr of 0.98 mg/dL). Liver Function Tests:  Recent Labs Lab 01/20/17 1156 01/21/17 0524 01/22/17 0422  AST 134* 123* 105*  ALT 49 48 47  ALKPHOS 221* 174* 165*  BILITOT 1.9* 1.2 0.8  PROT 6.9 5.7* 5.8*  ALBUMIN 2.8* 2.3* 2.4*   No results for input(s): LIPASE, AMYLASE in the last 168 hours.  Recent Labs Lab 01/20/17 1156 01/21/17 0930  AMMONIA 38* 29   Coagulation Profile: No results for input(s): INR, PROTIME in the last 168 hours. Cardiac Enzymes: No results for input(s): CKTOTAL, CKMB, CKMBINDEX, TROPONINI in the last 168 hours. BNP (last  3 results) No results for input(s): PROBNP in the last 8760 hours. HbA1C: No results for input(s): HGBA1C in the last 72 hours. CBG:  Recent Labs Lab 01/20/17 1213  GLUCAP 98   Lipid Profile: No results for input(s): CHOL, HDL, LDLCALC, TRIG, CHOLHDL, LDLDIRECT in the last 72 hours. Thyroid Function Tests: No results for input(s): TSH, T4TOTAL, FREET4, T3FREE, THYROIDAB in the last 72 hours. Anemia Panel: No results for input(s): VITAMINB12, FOLATE, FERRITIN, TIBC, IRON, RETICCTPCT in the last 72 hours. Sepsis Labs:  Recent Labs Lab 01/20/17 1207 01/20/17 1615 01/20/17 1954 01/21/17 0524  LATICACIDVEN 3.15* 2.9* 2.1* 1.6    Recent Results (from the past 240 hour(s))  Urine culture     Status: Abnormal   Collection Time: 01/20/17 11:43 AM  Result Value Ref Range Status   Specimen Description URINE, CATHETERIZED  Final   Special Requests Immunocompromised  Final   Culture MULTIPLE SPECIES PRESENT, SUGGEST RECOLLECTION (A)  Final   Report Status 01/21/2017 FINAL  Final  Culture, blood (routine x 2)     Status: None (Preliminary result)   Collection Time: 01/20/17 11:56 AM  Result Value Ref Range Status   Specimen Description BLOOD PORT  Final   Special Requests   Final    BOTTLES DRAWN AEROBIC AND ANAEROBIC Blood Culture adequate volume   Culture   Final    NO GROWTH 2 DAYS Performed at [facility], [address] [place], [place] [address]    Report Status PENDING  Incomplete  Culture, blood (routine x 2)     Status: None (Preliminary result)   Collection Time: 01/20/17 12:15 PM  Result Value Ref Range Status   Specimen Description BLOOD RIGHT HAND  Final   Special Requests   Final    BOTTLES DRAWN AEROBIC AND ANAEROBIC Blood Culture adequate volume   Culture   Final    NO GROWTH 2 DAYS Performed at [facility], [place] [address], [place], [place] [address]    Report Status PENDING  Incomplete  MRSA PCR Screening     Status: None   Collection Time:  01/20/17  6:20 PM  Result Value Ref Range Status   MRSA by PCR NEGATIVE NEGATIVE Final    Comment:        The GeneXpert MRSA Assay (FDA approved for NASAL specimens only), is one component of a comprehensive MRSA colonization surveillance program. It is not intended to diagnose MRSA infection nor to guide or monitor treatment for MRSA infections.          Radiology Studies: Dg Chest Port 1 View  Result Date: 01/22/2017 CLINICAL DATA:  Hypoxia EXAM: PORTABLE CHEST 1 VIEW COMPARISON:  01/21/2017 FINDINGS: Right Port-A-Cath remains in place, unchanged. Heart is normal size. Improving aeration at the right base with decreasing airspace opacity. Increasing left basilar atelectasis or infiltrate and possible small left effusion. IMPRESSION: Decreasing right basilar airspace opacity. Worsening left base atelectasis or infiltrate with small left effusion. Electronically Signed   By: Rolm Baptise M.D.   On: 01/22/2017 07:09   Dg Chest Port 1 View  Result Date: 01/21/2017 CLINICAL DATA:  Altered mental status, history of breast carcinoma with known metastasis, low oxygen saturation EXAM: PORTABLE CHEST 1 VIEW COMPARISON:  Chest x-ray of 01/20/2017 FINDINGS: There is abnormal opacity at the right lung base which may reflect atelectasis and effusion. Pneumonia cannot be excluded follow-up is recommended with two-view chest x-ray if possible. The left lung appears clear. Right sided Port-A-Cath is unchanged with the tip near the expected SVC -RA junction and heart size is stable. There is some inhomogeneity of the lateral aspect of several ribs bilaterally suspicious for metastases. IMPRESSION: 1. New opacity at the right lung base. Consider atelectasis and effusion although pneumonia cannot be excluded. 2. Mottled appearance of several ribs bilaterally suspicious for bone metastases. Electronically Signed   By: Ivar Drape M.D.   On: 01/21/2017 15:29        Scheduled Meds: . bisacodyl  10 mg  Rectal Once  . Chlorhexidine Gluconate Cloth  6 each Topical Q0600  . dexamethasone  10 mg Intravenous Q12H  . enoxaparin (LOVENOX) injection  40 mg Subcutaneous Q24H  . feeding supplement (ENSURE ENLIVE)  237 mL Oral BID BM  . furosemide  40 mg Intravenous Daily  . lactulose  30 g Oral Daily  . multivitamin with minerals  1 tablet Oral Daily  . pantoprazole (PROTONIX) IV  40 mg Intravenous QHS  . polyethylene glycol  17 g Oral Daily  . potassium chloride  20 mEq Oral Once  . senna  1 tablet Oral BID  .  sodium chloride flush  3 mL Intravenous Q12H   Continuous Infusions: . ceFEPime (MAXIPIME) IV Stopped (01/22/17 1630)  . dextrose 5 % and 0.45 % NaCl with KCl 40 mEq/L 100 mL/hr at 01/23/17 0433  . levETIRAcetam Stopped (01/23/17 0322)  . 0.9 % sodium chloride with kcl Stopped (01/22/17 1900)  . sodium chloride       LOS: 3 days    Time spent: 20 mins    OSEI-BONSU,Ciara Kagan, MD [facility] Pager [phone]  If 7PM-7AM, please contact night-coverage [email or web address] Password [facility] 01/23/2017, 8:52 AM

## 2017-01-23 NOTE — Progress Notes (Signed)
Pharmacy Antibiotic Note  Jill Johnston is a 51 y.o. female with metastatic breast cancer currently undergoing chemotherapy treatment presented to the ED on 01/20/2017 with c/o AMS.  To start vancomycin for suspected PNA. Vanc d/c'd 7/26 due to negative MRSA PCR  - 7/25 CXR: Shallow lung inflation with patchy bibasilar opacities, right greater than left. Findings may reflect atelectasis or possibly infiltrates.  Plan: Day 4 Abx's 1) Improved renal function - change cefepime from 2g IV q24 to 2g IV q12 2) Continue to monitor renal function and cultures  ____________________________  Height: 5\' 6"  (167.6 cm) Weight: 123 lb 14.4 oz (56.2 kg) IBW/kg (Calculated) : 59.3  Temp (24hrs), Avg:98.2 F (36.8 C), Min:97.8 F (36.6 C), Max:99 F (37.2 C)   Recent Labs Lab 01/20/17 1156 01/20/17 1207 01/20/17 1615 01/20/17 1954 01/21/17 0524 01/22/17 0422 01/23/17 0430  WBC 7.6  --   --   --  6.8 14.6* 16.2*  CREATININE 1.70*  --   --   --  1.40* 1.31* 0.98  LATICACIDVEN  --  3.15* 2.9* 2.1* 1.6  --   --     Estimated Creatinine Clearance: 60.3 mL/min (by C-G formula based on SCr of 0.98 mg/dL).    No Known Allergies   Thank you for allowing pharmacy to be a part of this patient's care.   Adrian Saran, PharmD, BCPS Pager [phone] 01/23/2017 9:34 AM

## 2017-01-23 NOTE — Progress Notes (Signed)
Daily Progress Note   Patient Name: Jill Johnston       Date: 01/23/2017 DOB: 10/08/1965  Age: 51 y.o. MRN#: [id number] Attending Physician: Benito Mccreedy, MD Primary Care Physician: [name], MD Admit Date: 01/20/2017  Reason for Consultation/Follow-up: Establishing goals of care  Subjective:  patient was restless off and on all night Received morphine and ativan earlier this am Husband reportedly did not want patient to get ativan Husband asking for more morphine, doses were reduced on 01-22-17.  Friend at bedside See below:   Length of Stay: 3  Current Medications: Scheduled Meds:  . bisacodyl  10 mg Rectal Once  . Chlorhexidine Gluconate Cloth  6 each Topical Q0600  . dexamethasone  10 mg Intravenous Q12H  . enoxaparin (LOVENOX) injection  40 mg Subcutaneous Q24H  . feeding supplement (ENSURE ENLIVE)  237 mL Oral BID BM  . furosemide  40 mg Intravenous Daily  . lactulose  30 g Oral Daily  . multivitamin with minerals  1 tablet Oral Daily  . pantoprazole (PROTONIX) IV  40 mg Intravenous QHS  . polyethylene glycol  17 g Oral Daily  . potassium chloride  20 mEq Oral Once  . senna  1 tablet Oral BID  . sodium chloride flush  3 mL Intravenous Q12H    Continuous Infusions: . ceFEPime (MAXIPIME) IV    . dextrose 5 % and 0.45 % NaCl with KCl 40 mEq/L 100 mL/hr at 01/23/17 0433  . levETIRAcetam Stopped (01/23/17 0322)  . 0.9 % sodium chloride with kcl Stopped (01/22/17 1900)  . sodium chloride      PRN Meds: acetaminophen **OR** acetaminophen, bisacodyl, hydrALAZINE, LORazepam, morphine injection, ondansetron **OR** ondansetron (ZOFRAN) IV, prochlorperazine, promethazine  Physical Exam         Weak, appears chronically ill Regular breathing S1 S2 Abdomen  soft Trace edema Restless Opens eyes, moaning and appears uncomfortable when awake  Vital Signs: BP (!) 167/111 (BP Location: Right Arm)   Pulse (!) 114   Temp 98.2 F (36.8 C) (Oral)   Resp 15   Ht 5\' 6"  (1.676 m)   Wt 56.2 kg (123 lb 14.4 oz)   SpO2 93%   BMI 20.00 kg/m  SpO2: SpO2: 93 % O2 Device: O2 Device: Nasal Cannula O2 Flow Rate: O2 Flow Rate (L/min): 6 L/min  Intake/output summary:  Intake/Output Summary (Last 24 hours) at 01/23/17 0942 Last data filed at 01/23/17 0600  Gross per 24 hour  Intake             2370 ml  Output              590 ml  Net             1780 ml   LBM: Last BM Date: 01/22/17 Baseline Weight: Weight: 56.7 kg (125 lb) Most recent weight: Weight: 56.2 kg (123 lb 14.4 oz)       Palliative Assessment/Data:    Flowsheet Rows     Most Recent Value  Intake Tab  Referral Department  Hospitalist  Unit at Time of Referral  Intermediate Care Unit  Palliative Care Primary Diagnosis  Cancer  Palliative Care Type  New Palliative care  Reason for referral  Pain, Non-pain Symptom, Counsel Regarding Hospice, Psychosocial or Spiritual support, Clarify Goals of Care, End of Life Care Assistance  Date first seen by Palliative Care  01/22/17  Clinical Assessment  Palliative Performance Scale Score  20%  Pain Max last 24 hours  5  Pain Min Last 24 hours  4  Dyspnea Max Last 24 Hours  5  Dyspnea Min Last 24 hours  4  Psychosocial & Spiritual Assessment  Palliative Care Outcomes  Patient/Family meeting held?  Yes  Who was at the meeting?  patient's husband.   Palliative Care Outcomes  Clarified goals of care      Patient Active Problem List   Diagnosis Date Noted  . Acute encephalopathy   . Protein-calorie malnutrition, severe 01/21/2017  . AKI (acute kidney injury) ([place])   . Constipation   . Dehydration   . Hypercalcemia of malignancy   . Acute metabolic encephalopathy   . Hypoxia   . Sepsis ([place]) 01/20/2017  . Hypercalcemia 01/20/2017  .  Malignant neoplasm of upper-inner quadrant of left breast in female, estrogen receptor positive ([place]) 10/30/2016  . Metastatic cancer ([place]) 05/17/2016  . HCAP (healthcare-associated pneumonia) 05/17/2016  . Genetic testing 05/11/2016  . Chemotherapy-induced thrombocytopenia 05/10/2016  . Leukopenia due to antineoplastic chemotherapy ([place]) 05/10/2016  . Metastasis to liver ([place]) 04/24/2016  . Breast cancer metastasized to lung, left ([place]) 04/24/2016  . Portacath in place 04/24/2016  . Oral thrush 04/24/2016  . Numbness 04/16/2016  . Essential hypertension 04/16/2016  . Carcinoma of left breast metastatic to multiple sites ([place]) 04/12/2016  . 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 Adenocarcinoma of breast metastatic to liver ([place]) 03/20/2016  . Gastroesophageal reflux disease with esophagitis 03/20/2016  . Drug-induced leukopenia ([place]) 01/26/2016  . High risk medication use 01/26/2016  . Lymphedema of left arm 01/12/2016  . Status post cholecystectomy 01/12/2016  . Cancer associated pain 01/12/2016  . Breast cancer metastasized to bone, left ([place]) 01/12/2016  . Gastroesophageal reflux disease 07/04/2015  . Bone metastases ([place]) 01/07/2015  . Malignant neoplasm of left breast [facility])     Palliative Care Assessment & Plan   Patient Profile:    Assessment:  metastatic breast cancer Hypercalcemia Uncontrolled symptoms: generalized pain, restlessness Ongoing multi factorial encephalopathy HCAP  Recommendations/Plan:   Increase Morphine IV PRN  Avoid Ativan  Continue to support husband and family, guide conversations. We are continuing to discuss hospice services along with remaining hopeful about the patient's mental status and her oral intake improving. Gentle discussions ongoing   Code Status:    Code Status Orders  Start     Ordered   01/22/17 1346  Limited resuscitation (code)  Continuous    Question Answer Comment  In the event of cardiac or  respiratory ARREST: Initiate Code Blue, Call Rapid Response Yes   In the event of cardiac or respiratory ARREST: Perform CPR No   In the event of cardiac or respiratory ARREST: Perform Intubation/Mechanical Ventilation No   In the event of cardiac or respiratory ARREST: Use NIPPV/BiPAp only if indicated Yes   In the event of cardiac or respiratory ARREST: Administer ACLS medications if indicated Yes   In the event of cardiac or respiratory ARREST: Perform Defibrillation or Cardioversion if indicated Yes   Comments discussed with husband, who is HCPOA      01/22/17 1346    Code Status History    Date Active Date Inactive Code Status Order ID Comments User Context   01/20/2017  6:05 PM 01/22/2017  1:46 PM Full Code [id number]  Elmarie Shiley, MD Inpatient   05/17/2016 12:36 PM 05/20/2016  3:57 PM Full Code [id number]  Mendel Corning, MD Inpatient   04/16/2016  6:00 PM 04/17/2016  6:44 PM DNR [id number]  Gordy Levan, MD Inpatient   04/16/2016  3:06 AM 04/16/2016  6:00 PM Full Code [id number]  Rise Patience, MD Inpatient   10/01/2014  9:32 AM 10/02/2014  3:43 AM Full Code [id number]  Marybelle Killings, MD HOV       Prognosis:   guarded  Discharge Planning:  To Be Determined  Care plan was discussed with  Patient's friend who is at the bedside, [facility] MD Dr. Orma Render.   Thank you for allowing the Palliative Medicine Team to assist in the care of this patient.   Time In: 8 Time Out: 8.35 Total Time 35 Prolonged Time Billed  no       Greater than 50%  of this time was spent counseling and coordinating care related to the above assessment and plan.  Loistine Chance, MD [phone]  Please contact Palliative Medicine Team phone at [phone] for questions and concerns.

## 2017-01-24 DIAGNOSIS — G9341 Metabolic encephalopathy: Secondary | ICD-10-CM

## 2017-01-24 MED ORDER — LORAZEPAM 2 MG/ML IJ SOLN
0.2500 mg | Freq: Four times a day (QID) | INTRAMUSCULAR | Status: DC | PRN
  Administered 2017-01-24 – 2017-01-27 (×7): 0.25 mg via INTRAVENOUS
  Filled 2017-01-24 (×8): qty 1

## 2017-01-24 NOTE — Progress Notes (Signed)
PROGRESS NOTE    Jill Schamberger  DJS:[id number] DOB: 1965-08-02 DOA: 01/20/2017 PCP: [name], MD    Brief Narrative:  Jill Johnston is a 51 y.o. female with medical history significant of estrogen positive metastatic breast cancer, diagnosed 2008, status post treatment with Gemcitabine 7 months ago, presenting with increasing confusion.  She has metastasis to liver which has progressed, lung mets stable, brain metastasis, plan was to start (CMF) by her oncologist. She had MRI brain which showed Worsening nodular pachymeningeal/leptomeningeal enhancement over the left cerebral convexity consistent with progressive metastatic disease.   Evaluation revealed pneumonia with acute renal failure associated with hypercalcemia     Assessment & Plan:   Active Problems:   Malignant neoplasm of left breast (HCC)   Breast cancer metastasized to bone, left (HCC)   Essential hypertension   Portacath in place   HCAP (healthcare-associated pneumonia)   Sepsis ([place])   Hypercalcemia   Protein-calorie malnutrition, severe   AKI (acute kidney injury) ([place])   Constipation   Dehydration   Hypercalcemia of malignancy   Acute metabolic encephalopathy   Hypoxia  #1 acute metabolic encephalopathy Toxic metabolic etiology due to combination of hypercalcemia with renal failure as well as pneumonia.  Check a EEG 01/22/17 -diffuse cerebral dysfunction,  non-specific, limited due to muscle artifact obscuring the bilateral temporal regions throughout most of the study. Supportive care for now   #2 symptomatic hypercalcemia of malignancy Patient status post zometa 4 mg IV 1.  On calcitonin 200 units IM twice a day for total of 4 doses.  Continue saline hydration and follow clinically  #3 HCAP/pneumonia Chest x-ray- some bilateral infiltrates.   MRSA PCR negative -discontinued IV vancomycin as MRSA PCR is negative.  Continue IV antibiotics Follow-up blood cultures  #4  hypokalemia Potassium repletion  #5 hypertension Stable.  #6 acute kidney injury Creatinine on admission was 1.7.  Acute prerenal azotemia secondary to volume depletion.  IV rehydration with monitoring of electrolytes and renal function #7 transaminitis Likely secondary to metastases to the liver. Follow clinically  #8 constipation. Laxatives when necessary  #9 metastatic breast cancer to the brain, liver, bone Patient with progression of metastatic disease to the bone.  Patient also with progression the brain with concern for possibility of subclinical seizures.  On IV Keppra and IV Decadron.  Poor prognosis.  Hematology/oncology following Palliative medicine assisting with symptom mx/goals of care.  #10 severe protein calorie malnutrition Nutritional support  #11 hypernatremia Hypotonic fluid administration  DVT prophylaxis: Lovenox Code Status: Full Family Communication: Updated mum at bedside. Disposition Plan: Remain in the step unit for now. Disposition undetermined at this time.   Consultants:   Hematology/oncology Dr.Magrinat 01/20/2017  Palliative care   Procedures:   CT head 01/20/2017  Chest x-ray 01/20/2017  Abdominal ultrasound 01/20/2017  EEG 01/22/17  Antimicrobials:   IV cefepime 01/20/2017  IV vancomycin 01/20/2017>>>> 01/21/2017   Subjective: Episodes of restlessness of overnight and initiated on morphine drip per palliative medicine. Resting comfortably this morning.  Objective: Vitals:   01/24/17 0600 01/24/17 0700 01/24/17 0800 01/24/17 0900  BP: (!) 174/103 117/75 113/78 136/81  Pulse: (!) 101 77 73 75  Resp: (!) 9 (!) 9 (!) 8 (!) 9  Temp:   (!) 97.5 F (36.4 C)   TempSrc:   Axillary   SpO2: 92% 94% 95% 96%  Weight:      Height:        Intake/Output Summary (Last 24 hours) at 01/24/17 8850 Last data filed at  01/24/17 0900  Gross per 24 hour  Intake          3953.36 ml  Output             1200 ml  Net           2753.36 ml   Filed Weights   01/21/17 0416 01/23/17 0540 01/24/17 0525  Weight: 57.3 kg (126 lb 5.2 oz) 56.2 kg (123 lb 14.4 oz) 57.2 kg (126 lb 1.7 oz)    Examination:  General exam: Resting comfortably, no acute distress  Respiratory system: Clear to auscultation anterior lung fields. Respiratory effort normal. Cardiovascular system: Tachycardia. No JVD, murmurs, rubs, gallops or clicks. No pedal edema. Gastrointestinal system: Abdomen is nondistended, soft and nontender. No organomegaly or masses felt. Normal bowel sounds heard. Central nervous system: lethargic but arousable, moves all extremities Extremities: No cyanosis or edema. Skin: No rashes, lesions or ulcers Psychiatry: Unable to assess.    Data Reviewed: I have personally reviewed following labs and imaging studies  CBC:  Recent Labs Lab 01/20/17 1156 01/21/17 0524 01/22/17 0422 01/23/17 0430  WBC 7.6 6.8 14.6* 16.2*  NEUTROABS  --   --  13.2* 14.6*  HGB 13.3 11.4* 11.9* 11.3*  HCT 41.5 34.9* 38.5 36.2  MCV 98.6 96.7 98.2 99.5  PLT 121* 110* 142* 409*   Basic Metabolic Panel:  Recent Labs Lab 01/20/17 1156 01/21/17 0524 01/22/17 0422 01/23/17 0430  NA 141 142 147* 147*  K 3.1* 3.4* 3.2* 3.8  CL 95* 106 107 111  CO2 36* 29 31 29   GLUCOSE 93 130* 154* 139*  BUN 36* 31* 38* 36*  CREATININE 1.70* 1.40* 1.31* 0.98  CALCIUM >15.0* 11.2* 9.6 8.9  MG  --  1.5* 2.5*  --    GFR: Estimated Creatinine Clearance: 61.3 mL/min (by C-G formula based on SCr of 0.98 mg/dL). Liver Function Tests:  Recent Labs Lab 01/20/17 1156 01/21/17 0524 01/22/17 0422  AST 134* 123* 105*  ALT 49 48 47  ALKPHOS 221* 174* 165*  BILITOT 1.9* 1.2 0.8  PROT 6.9 5.7* 5.8*  ALBUMIN 2.8* 2.3* 2.4*   No results for input(s): LIPASE, AMYLASE in the last 168 hours.  Recent Labs Lab 01/20/17 1156 01/21/17 0930  AMMONIA 38* 29   Coagulation Profile: No results for input(s): INR, PROTIME in the last 168 hours. Cardiac  Enzymes: No results for input(s): CKTOTAL, CKMB, CKMBINDEX, TROPONINI in the last 168 hours. BNP (last 3 results) No results for input(s): PROBNP in the last 8760 hours. HbA1C: No results for input(s): HGBA1C in the last 72 hours. CBG:  Recent Labs Lab 01/20/17 1213  GLUCAP 98   Lipid Profile: No results for input(s): CHOL, HDL, LDLCALC, TRIG, CHOLHDL, LDLDIRECT in the last 72 hours. Thyroid Function Tests: No results for input(s): TSH, T4TOTAL, FREET4, T3FREE, THYROIDAB in the last 72 hours. Anemia Panel: No results for input(s): VITAMINB12, FOLATE, FERRITIN, TIBC, IRON, RETICCTPCT in the last 72 hours. Sepsis Labs:  Recent Labs Lab 01/20/17 1207 01/20/17 1615 01/20/17 1954 01/21/17 0524  LATICACIDVEN 3.15* 2.9* 2.1* 1.6    Recent Results (from the past 240 hour(s))  Urine culture     Status: Abnormal   Collection Time: 01/20/17 11:43 AM  Result Value Ref Range Status   Specimen Description URINE, CATHETERIZED  Final   Special Requests Immunocompromised  Final   Culture MULTIPLE SPECIES PRESENT, SUGGEST RECOLLECTION (A)  Final   Report Status 01/21/2017 FINAL  Final  Culture, blood (routine x 2)  Status: None (Preliminary result)   Collection Time: 01/20/17 11:56 AM  Result Value Ref Range Status   Specimen Description BLOOD PORT  Final   Special Requests   Final    BOTTLES DRAWN AEROBIC AND ANAEROBIC Blood Culture adequate volume   Culture   Final    NO GROWTH 3 DAYS Performed at [facility], [address], [place], [place] [address]    Report Status PENDING  Incomplete  Culture, blood (routine x 2)     Status: None (Preliminary result)   Collection Time: 01/20/17 12:15 PM  Result Value Ref Range Status   Specimen Description BLOOD RIGHT HAND  Final   Special Requests   Final    BOTTLES DRAWN AEROBIC AND ANAEROBIC Blood Culture adequate volume   Culture   Final    NO GROWTH 3 DAYS Performed at [facility], [place] [address] [place],  [place] [address]    Report Status PENDING  Incomplete  MRSA PCR Screening     Status: None   Collection Time: 01/20/17  6:20 PM  Result Value Ref Range Status   MRSA by PCR NEGATIVE NEGATIVE Final    Comment:        The GeneXpert MRSA Assay (FDA approved for NASAL specimens only), is one component of a comprehensive MRSA colonization surveillance program. It is not intended to diagnose MRSA infection nor to guide or monitor treatment for MRSA infections.          Radiology Studies: No results found.      Scheduled Meds: . bisacodyl  10 mg Rectal Once  . Chlorhexidine Gluconate Cloth  6 each Topical Q0600  . dexamethasone  10 mg Intravenous Q12H  . enoxaparin (LOVENOX) injection  40 mg Subcutaneous Q24H  . feeding supplement (ENSURE ENLIVE)  237 mL Oral BID BM  . furosemide  40 mg Intravenous Daily  . lactulose  30 g Oral Daily  . multivitamin with minerals  1 tablet Oral Daily  . pantoprazole (PROTONIX) IV  40 mg Intravenous QHS  . polyethylene glycol  17 g Oral Daily  . potassium chloride  20 mEq Oral Once  . senna  1 tablet Oral BID  . sodium chloride flush  3 mL Intravenous Q12H   Continuous Infusions: . ceFEPime (MAXIPIME) IV Stopped (01/24/17 0100)  . dextrose 5 % and 0.45 % NaCl with KCl 40 mEq/L 100 mL/hr at 01/24/17 0900  . levETIRAcetam Stopped (01/24/17 0451)  . morphine 3 mg/hr (01/24/17 0900)  . 0.9 % sodium chloride with kcl Stopped (01/22/17 1900)  . sodium chloride       LOS: 4 days    Time spent: 20 mins    OSEI-BONSU,Jayron Maqueda, MD [facility] Pager [phone]  If 7PM-7AM, please contact night-coverage [email or web address] Password [facility] 01/24/2017, 9:39 AM

## 2017-01-24 NOTE — Progress Notes (Signed)
Daily Progress Note   Patient Name: Jill Johnston       Date: 01/24/2017 DOB: Apr 17, 1966  Age: 51 y.o. MRN#: [id number] Attending Physician: Benito Mccreedy, MD Primary Care Physician: Jana Hakim Virgie Dad, MD Admit Date: 01/20/2017  Reason for Consultation/Follow-up: Establishing goals of care  Subjective:  patient is awake and tracks me in the room, but she is with restlessness, anxiety, possible air hunger Husband and 32 year old son will visit later today Friend who is an MD present in the room Rested well after she received Ativan last night See below:   Length of Stay: 4  Current Medications: Scheduled Meds:  . bisacodyl  10 mg Rectal Once  . Chlorhexidine Gluconate Cloth  6 each Topical Q0600  . dexamethasone  10 mg Intravenous Q12H  . enoxaparin (LOVENOX) injection  40 mg Subcutaneous Q24H  . feeding supplement (ENSURE ENLIVE)  237 mL Oral BID BM  . furosemide  40 mg Intravenous Daily  . lactulose  30 g Oral Daily  . multivitamin with minerals  1 tablet Oral Daily  . pantoprazole (PROTONIX) IV  40 mg Intravenous QHS  . polyethylene glycol  17 g Oral Daily  . potassium chloride  20 mEq Oral Once  . senna  1 tablet Oral BID  . sodium chloride flush  3 mL Intravenous Q12H    Continuous Infusions: . ceFEPime (MAXIPIME) IV Stopped (01/24/17 0100)  . dextrose 5 % and 0.45 % NaCl with KCl 40 mEq/L 100 mL/hr at 01/24/17 0900  . levETIRAcetam Stopped (01/24/17 0451)  . morphine 3 mg/hr (01/24/17 0900)  . 0.9 % sodium chloride with kcl Stopped (01/22/17 1900)  . sodium chloride      PRN Meds: acetaminophen **OR** acetaminophen, bisacodyl, hydrALAZINE, LORazepam, morphine injection, morphine **AND** morphine, ondansetron **OR** ondansetron (ZOFRAN) IV, prochlorperazine,  promethazine  Physical Exam         Weak, appears chronically ill Regular breathing S1 S2 Abdomen soft Trace edema Restless Opens eyes, moaning and appears uncomfortable when awake  Vital Signs: BP 136/81   Pulse 75   Temp (!) 97.5 F (36.4 C) (Axillary)   Resp (!) 9   Ht 5\' 6"  (1.676 m)   Wt 57.2 kg (126 lb 1.7 oz)   SpO2 96%   BMI 20.35 kg/m  SpO2: SpO2: 96 % O2  Device: O2 Device: Nasal Cannula O2 Flow Rate: O2 Flow Rate (L/min): 6 L/min  Intake/output summary:   Intake/Output Summary (Last 24 hours) at 01/24/17 1008 Last data filed at 01/24/17 0900  Gross per 24 hour  Intake          3853.36 ml  Output             1200 ml  Net          2653.36 ml   LBM: Last BM Date: 01/21/17 Baseline Weight: Weight: 56.7 kg (125 lb) Most recent weight: Weight: 57.2 kg (126 lb 1.7 oz)       Palliative Assessment/Data:    Flowsheet Rows     Most Recent Value  Intake Tab  Referral Department  Hospitalist  Unit at Time of Referral  Intermediate Care Unit  Palliative Care Primary Diagnosis  Cancer  Palliative Care Type  New Palliative care  Reason for referral  Pain, Non-pain Symptom, Counsel Regarding Hospice, Psychosocial or Spiritual support, Clarify Goals of Care, End of Life Care Assistance  Date first seen by Palliative Care  01/22/17  Clinical Assessment  Palliative Performance Scale Score  20%  Pain Max last 24 hours  5  Pain Min Last 24 hours  4  Dyspnea Max Last 24 Hours  5  Dyspnea Min Last 24 hours  4  Psychosocial & Spiritual Assessment  Palliative Care Outcomes  Patient/Family meeting held?  Yes  Who was at the meeting?  patient's husband.   Palliative Care Outcomes  Clarified goals of care      Patient Active Problem List   Diagnosis Date Noted  . Acute encephalopathy   . Protein-calorie malnutrition, severe 01/21/2017  . AKI (acute kidney injury) ([place])   . Constipation   . Dehydration   . Hypercalcemia of malignancy   . Acute metabolic  encephalopathy   . Hypoxia   . Sepsis ([place]) 01/20/2017  . Hypercalcemia 01/20/2017  . Malignant neoplasm of upper-inner quadrant of left breast in female, estrogen receptor positive ([place]) 10/30/2016  . Metastatic cancer ([place]) 05/17/2016  . HCAP (healthcare-associated pneumonia) 05/17/2016  . Genetic testing 05/11/2016  . Chemotherapy-induced thrombocytopenia 05/10/2016  . Leukopenia due to antineoplastic chemotherapy ([place]) 05/10/2016  . Metastasis to liver ([place]) 04/24/2016  . Breast cancer metastasized to lung, left ([place]) 04/24/2016  . Portacath in place 04/24/2016  . Oral thrush 04/24/2016  . Numbness 04/16/2016  . Essential hypertension 04/16/2016  . Carcinoma of left breast metastatic to multiple sites ([place]) 04/12/2016  . 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 Adenocarcinoma of breast metastatic to liver ([place]) 03/20/2016  . Gastroesophageal reflux disease with esophagitis 03/20/2016  . Drug-induced leukopenia ([place]) 01/26/2016  . High risk medication use 01/26/2016  . Lymphedema of left arm 01/12/2016  . Status post cholecystectomy 01/12/2016  . Cancer associated pain 01/12/2016  . Breast cancer metastasized to bone, left ([place]) 01/12/2016  . Gastroesophageal reflux disease 07/04/2015  . Bone metastases ([place]) 01/07/2015  . Malignant neoplasm of left breast [facility])     Palliative Care Assessment & Plan   Patient Profile:    Assessment:  metastatic breast cancer Hypercalcemia Uncontrolled symptoms: generalized pain, restlessness Ongoing multi factorial encephalopathy HCAP  Recommendations/Plan:    Morphine IV drip, titrate to comfort for symptom management of pain and dyspnea  Family now ok with low dose Ativan for symptom management of restlessness.   Continue to support husband and family, guide conversations. We are continuing to discuss hospice services along with remaining hopeful  about the patient's mental status and her oral intake improving. Gentle  discussions ongoing.    Code Status:    Code Status Orders        Start     Ordered   01/22/17 1346  Limited resuscitation (code)  Continuous    Question Answer Comment  In the event of cardiac or respiratory ARREST: Initiate Code Blue, Call Rapid Response Yes   In the event of cardiac or respiratory ARREST: Perform CPR No   In the event of cardiac or respiratory ARREST: Perform Intubation/Mechanical Ventilation No   In the event of cardiac or respiratory ARREST: Use NIPPV/BiPAp only if indicated Yes   In the event of cardiac or respiratory ARREST: Administer ACLS medications if indicated Yes   In the event of cardiac or respiratory ARREST: Perform Defibrillation or Cardioversion if indicated Yes   Comments discussed with husband, who is HCPOA      01/22/17 1346    Code Status History    Date Active Date Inactive Code Status Order ID Comments User Context   01/20/2017  6:05 PM 01/22/2017  1:46 PM Full Code [id number]  Elmarie Shiley, MD Inpatient   05/17/2016 12:36 PM 05/20/2016  3:57 PM Full Code [id number]  Mendel Corning, MD Inpatient   04/16/2016  6:00 PM 04/17/2016  6:44 PM DNR [id number]  Gordy Levan, MD Inpatient   04/16/2016  3:06 AM 04/16/2016  6:00 PM Full Code [id number]  Rise Patience, MD Inpatient   10/01/2014  9:32 AM 10/02/2014  3:43 AM Full Code [id number]  Marybelle Killings, MD HOV       Prognosis:   guarded  Discharge Planning:  To Be Determined  Care plan was discussed with  Patient's friend who is at the bedside who is an MD.  Thank you for allowing the Palliative Medicine Team to assist in the care of this patient.   Time In: 9 Time Out: 9.35 Total Time 35 Prolonged Time Billed  no       Greater than 50%  of this time was spent counseling and coordinating care related to the above assessment and plan.  Loistine Chance, MD [phone]  Please contact Palliative Medicine Team phone at [phone] for questions and concerns.

## 2017-01-25 DIAGNOSIS — C7949 Secondary malignant neoplasm of other parts of nervous system: Secondary | ICD-10-CM

## 2017-01-25 LAB — CBC
HEMATOCRIT: 34.5 % — AB (ref 36.0–46.0)
HEMOGLOBIN: 10.5 g/dL — AB (ref 12.0–15.0)
MCH: 30.8 pg (ref 26.0–34.0)
MCHC: 30.4 g/dL (ref 30.0–36.0)
MCV: 101.2 fL — AB (ref 78.0–100.0)
Platelets: 101 10*3/uL — ABNORMAL LOW (ref 150–400)
RBC: 3.41 MIL/uL — ABNORMAL LOW (ref 3.87–5.11)
RDW: 18.2 % — ABNORMAL HIGH (ref 11.5–15.5)
WBC: 10.5 10*3/uL (ref 4.0–10.5)

## 2017-01-25 LAB — CULTURE, BLOOD (ROUTINE X 2)
CULTURE: NO GROWTH
CULTURE: NO GROWTH
SPECIAL REQUESTS: ADEQUATE
Special Requests: ADEQUATE

## 2017-01-25 LAB — BASIC METABOLIC PANEL
ANION GAP: 4 — AB (ref 5–15)
BUN: 30 mg/dL — ABNORMAL HIGH (ref 6–20)
CHLORIDE: 113 mmol/L — AB (ref 101–111)
CO2: 27 mmol/L (ref 22–32)
Calcium: 8.1 mg/dL — ABNORMAL LOW (ref 8.9–10.3)
Creatinine, Ser: 0.64 mg/dL (ref 0.44–1.00)
GFR calc Af Amer: >60 mL/min (ref >60)
GFR calc non Af Amer: >60 mL/min (ref >60)
GLUCOSE: 163 mg/dL — AB (ref 65–99)
POTASSIUM: 5.2 mmol/L — AB (ref 3.5–5.1)
Sodium: 144 mmol/L (ref 135–145)

## 2017-01-25 MED ORDER — METOPROLOL TARTRATE 5 MG/5ML IV SOLN
5.0000 mg | Freq: Four times a day (QID) | INTRAVENOUS | Status: DC | PRN
  Filled 2017-01-25: qty 5

## 2017-01-25 MED ORDER — DEXTROSE-NACL 5-0.45 % IV SOLN
INTRAVENOUS | Status: DC
  Administered 2017-01-25 – 2017-01-28 (×5): via INTRAVENOUS

## 2017-01-25 NOTE — Progress Notes (Addendum)
Patient extremely agitated, moaning and grimacing, and attempting to climb OOB.  Husband asked this RN to come in and work with medication.  I bolused her with Morphine 0.5 mg and increased her drip rate to 5 mg/hr. Ativan 0.25 mg had just been given a few minutes earlier with no calming effect at this time.

## 2017-01-25 NOTE — Progress Notes (Signed)
Daily Progress Note   Patient Name: Jill Johnston       Date: 01/25/2017 DOB: 03-20-1966  Age: 51 y.o. MRN#: [id number] Attending Physician: Elmarie Shiley, MD Primary Care Physician: [name], MD Admit Date: 01/20/2017  Reason for Consultation/Follow-up: Establishing goals of care  Subjective:  patient has ongoing symptom burden of restlessness, anxiety, possible air hunger Husband  present in the room   See below:   Length of Stay: 5  Current Medications: Scheduled Meds:  . Chlorhexidine Gluconate Cloth  6 each Topical Q0600  . dexamethasone  10 mg Intravenous Q12H  . enoxaparin (LOVENOX) injection  40 mg Subcutaneous Q24H  . feeding supplement (ENSURE ENLIVE)  237 mL Oral BID BM  . lactulose  30 g Oral Daily  . multivitamin with minerals  1 tablet Oral Daily  . pantoprazole (PROTONIX) IV  40 mg Intravenous QHS  . polyethylene glycol  17 g Oral Daily  . senna  1 tablet Oral BID  . sodium chloride flush  3 mL Intravenous Q12H    Continuous Infusions: . ceFEPime (MAXIPIME) IV Stopped (01/25/17 0044)  . dextrose 5 % and 0.45% NaCl 50 mL/hr at 01/25/17 0854  . levETIRAcetam Stopped (01/25/17 0344)  . morphine 3 mg/hr (01/25/17 0600)  . sodium chloride      PRN Meds: acetaminophen **OR** acetaminophen, bisacodyl, hydrALAZINE, LORazepam, morphine injection, morphine **AND** morphine, ondansetron **OR** ondansetron (ZOFRAN) IV, prochlorperazine, promethazine  Physical Exam         Weak, appears chronically ill Dry mouth Regular breathing S1 S2 Abdomen soft Trace edema Restless Opens eyes, moaning and appears uncomfortable when awake  Vital Signs: BP (!) 151/111 (BP Location: Right Arm)   Pulse (!) 129   Temp 97.8 F (36.6 C) (Axillary)   Resp 13   Ht  5\' 6"  (1.676 m)   Wt 56.9 kg (125 lb 7.1 oz)   SpO2 93%   BMI 20.25 kg/m  SpO2: SpO2: 93 % O2 Device: O2 Device: Nasal Cannula O2 Flow Rate: O2 Flow Rate (L/min): 3 L/min  Intake/output summary:   Intake/Output Summary (Last 24 hours) at 01/25/17 0959 Last data filed at 01/25/17 0800  Gross per 24 hour  Intake             2539 ml  Output  250 ml  Net             2289 ml   LBM: Last BM Date: 01/24/17 Baseline Weight: Weight: 56.7 kg (125 lb) Most recent weight: Weight: 56.9 kg (125 lb 7.1 oz)       Palliative Assessment/Data:    Flowsheet Rows     Most Recent Value  Intake Tab  Referral Department  Hospitalist  Unit at Time of Referral  Intermediate Care Unit  Palliative Care Primary Diagnosis  Cancer  Palliative Care Type  New Palliative care  Reason for referral  Pain, Non-pain Symptom, Counsel Regarding Hospice, Psychosocial or Spiritual support, Clarify Goals of Care, End of Life Care Assistance  Date first seen by Palliative Care  01/22/17  Clinical Assessment  Palliative Performance Scale Score  20%  Pain Max last 24 hours  5  Pain Min Last 24 hours  4  Dyspnea Max Last 24 Hours  5  Dyspnea Min Last 24 hours  4  Psychosocial & Spiritual Assessment  Palliative Care Outcomes  Patient/Family meeting held?  Yes  Who was at the meeting?  patient's husband.   Palliative Care Outcomes  Clarified goals of care      Patient Active Problem List   Diagnosis Date Noted  . Acute encephalopathy   . Protein-calorie malnutrition, severe 01/21/2017  . AKI (acute kidney injury) ([place])   . Constipation   . Dehydration   . Hypercalcemia of malignancy   . Acute metabolic encephalopathy   . Hypoxia   . Sepsis ([place]) 01/20/2017  . Hypercalcemia 01/20/2017  . Malignant neoplasm of upper-inner quadrant of left breast in female, estrogen receptor positive ([place]) 10/30/2016  . Metastatic cancer ([place]) 05/17/2016  . HCAP (healthcare-associated pneumonia) 05/17/2016    . Genetic testing 05/11/2016  . Chemotherapy-induced thrombocytopenia 05/10/2016  . Leukopenia due to antineoplastic chemotherapy ([place]) 05/10/2016  . Metastasis to liver ([place]) 04/24/2016  . Breast cancer metastasized to lung, left ([place]) 04/24/2016  . Portacath in place 04/24/2016  . Oral thrush 04/24/2016  . Numbness 04/16/2016  . Essential hypertension 04/16/2016  . Carcinoma of left breast metastatic to multiple sites ([place]) 04/12/2016  . 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 Gastroesophageal reflux disease with esophagitis 03/20/2016  . Drug-induced leukopenia ([place]) 01/26/2016  . High risk medication use 01/26/2016  . Lymphedema of left arm 01/12/2016  . Status post cholecystectomy 01/12/2016  . Cancer associated pain 01/12/2016  . Breast cancer metastasized to bone, left ([place]) 01/12/2016  . Gastroesophageal reflux disease 07/04/2015  . Bone metastases ([place]) 01/07/2015  . Malignant neoplasm of left breast [facility])     Palliative Care Assessment & Plan   Patient Profile:    Assessment:  metastatic breast cancer Hypercalcemia Uncontrolled symptoms: generalized pain, restlessness Ongoing multi factorial encephalopathy HCAP  Recommendations/Plan:    Morphine IV drip, titrate to comfort for symptom management of pain and dyspnea    low dose Ativan for symptom management of restlessness.   Continue to support husband and family, guide conversations. We are continuing to discuss hospice services along with remaining hopeful about the patient's mental status and her oral intake improving. Gentle discussions ongoing. Appreciate oncology follow up, discussed with [facility] MD Dr. Tyrell Antonio.     Code Status:    Code Status Orders        Start     Ordered   01/22/17 1346  Limited resuscitation (code)  Continuous    Question Answer  Comment  In the event of cardiac or respiratory ARREST: Initiate Code Blue,  Call Rapid Response Yes   In the event of cardiac or respiratory ARREST: Perform CPR No   In the event of cardiac or respiratory ARREST: Perform Intubation/Mechanical Ventilation No   In the event of cardiac or respiratory ARREST: Use NIPPV/BiPAp only if indicated Yes   In the event of cardiac or respiratory ARREST: Administer ACLS medications if indicated Yes   In the event of cardiac or respiratory ARREST: Perform Defibrillation or Cardioversion if indicated Yes   Comments discussed with husband, who is HCPOA      01/22/17 1346    Code Status History    Date Active Date Inactive Code Status Order ID Comments User Context   01/20/2017  6:05 PM 01/22/2017  1:46 PM Full Code [id number]  Elmarie Shiley, MD Inpatient   05/17/2016 12:36 PM 05/20/2016  3:57 PM Full Code [id number]  Mendel Corning, MD Inpatient   04/16/2016  6:00 PM 04/17/2016  6:44 PM DNR [id number]  Gordy Levan, MD Inpatient   04/16/2016  3:06 AM 04/16/2016  6:00 PM Full Code [id number]  Rise Patience, MD Inpatient   10/01/2014  9:32 AM 10/02/2014  3:43 AM Full Code [id number]  Marybelle Killings, MD HOV       Prognosis:   guarded, less than 2 weeks.   Discharge Planning:  To Be Determined  Care plan was discussed with  husband Thank you for allowing the Palliative Medicine Team to assist in the care of this patient.   Time In: 9.30 Time Out: 10 Total Time 30 Prolonged Time Billed  no       Greater than 50%  of this time was spent counseling and coordinating care related to the above assessment and plan.  Loistine Chance, MD [phone]  Please contact Palliative Medicine Team phone at [phone] for questions and concerns.

## 2017-01-25 NOTE — Progress Notes (Signed)
PROGRESS NOTE    Jill Johnston  POE:[id number] DOB: 04/25/1966 DOA: 01/20/2017 PCP: [name], MD    Brief Narrative:  Jill Johnston is a 51 y.o. female with medical history significant of estrogen positive metastatic breast cancer, diagnosed 2008, status post treatment with Gemcitabine 7 months ago, presenting with increasing confusion. She has metastasis to liver which has progressed, lung mets stable, brain metastasis, plan was to start ([facility]) by her oncologist. She had MRI brain which showed Worsening nodular pachymeningeal/leptomeningeal enhancement over the left cerebral convexity consistent with progressive metastatic disease.   Evaluation revealed pneumonia with acute renal failure associated with hypercalcemia     Assessment & Plan:   Active Problems:   Malignant neoplasm of left breast (H[facility])   Breast cancer metastasized to bone, left (HCC)   Essential hypertension   Portacath in place   HCAP (healthcare-associated pneumonia)   Sepsis ([place])   Hypercalcemia   Protein-calorie malnutrition, severe   AKI (acute kidney injury) ([name])   Constipation   Dehydration   Hypercalcemia of malignancy   Acute metabolic encephalopathy   Hypoxia  1 Acute metabolic encephalopathy -Toxic metabolic etiology due to combination of hypercalcemia with renal failure as well as pneumonia.  -EEG 01/22/17 -diffuse cerebral dysfunction,  non-specific, limited due to muscle artifact obscuring the bilateral temporal regions throughout most of the study. -Supportive care for now -ativan dose decreased. PRN  -continue with keppra, decadron.  -still agitated and restless at times.   2  metastatic breast cancer to the brain, liver, bone Patient with progression of metastatic disease to the bone.  Continue with  IV Keppra and IV Decadron.  Poor prognosis. Hematology/oncology following Palliative medicine assisting with symptom mx/goals of care.  3-symptomatic hypercalcemia of  malignancy Patient status post zometa 4 mg IV 1.  Received calcitonin 200 units IM twice a day for total of 4 doses.  Received IV fluids and lasix.  Calcium has decreased to 8.1,, corrected by albumin 9.7 Will stop IV lasix.   3 HCAP/pneumonia Chest x-ray- some bilateral infiltrates.   MRSA PCR negative -discontinued IV vancomycin as MRSA PCR is negative.  Continue IV antibiotics Follow-up blood cultures; No growth to date.  WBC normalized today.    hypertension PRN hydralazine.  BP increases when she is agitated.   6 acute kidney injury Creatinine on admission was 1.7.  Acute prerenal azotemia secondary to volume depletion.  Improved.   7 transaminitis Likely secondary to metastases to the liver. Follow clinically  8 constipation. Laxatives when necessary Had BM during admission.   9 severe protein calorie malnutrition Nutritional support  11 hypernatremia Resolved.   hypokalemia Resolved. Now with hyperkalemia.  Hyperkalemia; change IV fluids to D 5 , ns.   DVT prophylaxis: Lovenox Code Status: Full Family Communication: Updated husband at bedside.  Disposition Plan: Remain in the step unit for now. Disposition undetermined at this time.   Consultants:   Hematology/oncology Dr.Magrinat 01/20/2017  Palliative care   Procedures:   CT head 01/20/2017  Chest x-ray 01/20/2017  Abdominal ultrasound 01/20/2017  EEG 01/22/17  Antimicrobials:   IV cefepime 01/20/2017  IV vancomycin 01/20/2017>>>> 01/21/2017   Subjective: Still restless at times.  Appears in pain.   Objective: Vitals:   01/25/17 0422 01/25/17 0500 01/25/17 0600 01/25/17 0700  BP:  124/84 125/82 (!) 137/92  Pulse:  82 75 83  Resp:  15 12 (!) 9  Temp:      TempSrc:      SpO2:  94% 95% 95%  Weight: 56.9 kg (125 lb 7.1 oz)     Height:        Intake/Output Summary (Last 24 hours) at 01/25/17 0740 Last data filed at 01/25/17 0600  Gross per 24 hour  Intake             2988 ml   Output              250 ml  Net             2738 ml   Filed Weights   01/23/17 0540 01/24/17 0525 01/25/17 0422  Weight: 56.2 kg (123 lb 14.4 oz) 57.2 kg (126 lb 1.7 oz) 56.9 kg (125 lb 7.1 oz)    Examination:  General exam: appears in pain, Respiratory system: Respiratory effort normal.  Cardiovascular system: tachycardia. S1, S 2 RRR Gastrointestinal system: Abdomen soft, nt Central nervous system: keeps eyes closes, moves extremities.  Extremities; no cyanosis or edema Skin: No rashes, lesions or ulcers Psychiatry: Unable to assess.    Data Reviewed: I have personally reviewed following labs and imaging studies  CBC:  Recent Labs Lab 01/20/17 1156 01/21/17 0524 01/22/17 0422 01/23/17 0430 01/25/17 0414  WBC 7.6 6.8 14.6* 16.2* 10.5  NEUTROABS  --   --  13.2* 14.6*  --   HGB 13.3 11.4* 11.9* 11.3* 10.5*  HCT 41.5 34.9* 38.5 36.2 34.5*  MCV 98.6 96.7 98.2 99.5 101.2*  PLT 121* 110* 142* 134* 237*   Basic Metabolic Panel:  Recent Labs Lab 01/20/17 1156 01/21/17 0524 01/22/17 0422 01/23/17 0430 01/25/17 0414  NA 141 142 147* 147* 144  K 3.1* 3.4* 3.2* 3.8 5.2*  CL 95* 106 107 111 113*  CO2 36* 29 31 29 27   GLUCOSE 93 130* 154* 139* 163*  BUN 36* 31* 38* 36* 30*  CREATININE 1.70* 1.40* 1.31* 0.98 0.64  CALCIUM >15.0* 11.2* 9.6 8.9 8.1*  MG  --  1.5* 2.5*  --   --    GFR: Estimated Creatinine Clearance: 74.7 mL/min (by C-G formula based on SCr of 0.64 mg/dL). Liver Function Tests:  Recent Labs Lab 01/20/17 1156 01/21/17 0524 01/22/17 0422  AST 134* 123* 105*  ALT 49 48 47  ALKPHOS 221* 174* 165*  BILITOT 1.9* 1.2 0.8  PROT 6.9 5.7* 5.8*  ALBUMIN 2.8* 2.3* 2.4*   No results for input(s): LIPASE, AMYLASE in the last 168 hours.  Recent Labs Lab 01/20/17 1156 01/21/17 0930  AMMONIA 38* 29   Coagulation Profile: No results for input(s): INR, PROTIME in the last 168 hours. Cardiac Enzymes: No results for input(s): CKTOTAL, CKMB, CKMBINDEX,  TROPONINI in the last 168 hours. BNP (last 3 results) No results for input(s): PROBNP in the last 8760 hours. HbA1C: No results for input(s): HGBA1C in the last 72 hours. CBG:  Recent Labs Lab 01/20/17 1213  GLUCAP 98   Lipid Profile: No results for input(s): CHOL, HDL, LDLCALC, TRIG, CHOLHDL, LDLDIRECT in the last 72 hours. Thyroid Function Tests: No results for input(s): TSH, T4TOTAL, FREET4, T3FREE, THYROIDAB in the last 72 hours. Anemia Panel: No results for input(s): VITAMINB12, FOLATE, FERRITIN, TIBC, IRON, RETICCTPCT in the last 72 hours. Sepsis Labs:  Recent Labs Lab 01/20/17 1207 01/20/17 1615 01/20/17 1954 01/21/17 0524  LATICACIDVEN 3.15* 2.9* 2.1* 1.6    Recent Results (from the past 240 hour(s))  Urine culture     Status: Abnormal   Collection Time: 01/20/17 11:43 AM  Result Value Ref Range Status   Specimen Description URINE, CATHETERIZED  Final   Special Requests Immunocompromised  Final   Culture MULTIPLE SPECIES PRESENT, SUGGEST RECOLLECTION (A)  Final   Report Status 01/21/2017 FINAL  Final  Culture, blood (routine x 2)     Status: None (Preliminary result)   Collection Time: 01/20/17 11:56 AM  Result Value Ref Range Status   Specimen Description BLOOD PORT  Final   Special Requests   Final    BOTTLES DRAWN AEROBIC AND ANAEROBIC Blood Culture adequate volume   Culture   Final    NO GROWTH 4 DAYS Performed at [facility], [address] [place], [place] [address]    Report Status PENDING  Incomplete  Culture, blood (routine x 2)     Status: None (Preliminary result)   Collection Time: 01/20/17 12:15 PM  Result Value Ref Range Status   Specimen Description BLOOD RIGHT HAND  Final   Special Requests   Final    BOTTLES DRAWN AEROBIC AND ANAEROBIC Blood Culture adequate volume   Culture   Final    NO GROWTH 4 DAYS Performed at [facility], [place] [address], [place], [place] [address]    Report Status PENDING  Incomplete  MRSA PCR  Screening     Status: None   Collection Time: 01/20/17  6:20 PM  Result Value Ref Range Status   MRSA by PCR NEGATIVE NEGATIVE Final    Comment:        The GeneXpert MRSA Assay (FDA approved for NASAL specimens only), is one component of a comprehensive MRSA colonization surveillance program. It is not intended to diagnose MRSA infection nor to guide or monitor treatment for MRSA infections.          Radiology Studies: No results found.      Scheduled Meds: . bisacodyl  10 mg Rectal Once  . Chlorhexidine Gluconate Cloth  6 each Topical Q0600  . dexamethasone  10 mg Intravenous Q12H  . enoxaparin (LOVENOX) injection  40 mg Subcutaneous Q24H  . feeding supplement (ENSURE ENLIVE)  237 mL Oral BID BM  . furosemide  40 mg Intravenous Daily  . lactulose  30 g Oral Daily  . multivitamin with minerals  1 tablet Oral Daily  . pantoprazole (PROTONIX) IV  40 mg Intravenous QHS  . polyethylene glycol  17 g Oral Daily  . senna  1 tablet Oral BID  . sodium chloride flush  3 mL Intravenous Q12H   Continuous Infusions: . ceFEPime (MAXIPIME) IV Stopped (01/25/17 0044)  . levETIRAcetam Stopped (01/25/17 0344)  . morphine 3 mg/hr (01/25/17 0600)  . sodium chloride       LOS: 5 days    Time spent: 20 mins    Elmarie Shiley, MD [facility] Pager [phone]  If 7PM-7AM, please contact night-coverage [email or web address] Password [email or web address] 01/25/2017, 7:40 AM

## 2017-01-25 NOTE — Progress Notes (Signed)
Patient resting well at this time. Husband has gone home to take a shower at this time with family friend at patients bedside.

## 2017-01-25 NOTE — Progress Notes (Signed)
Date:  January 25, 2017 Chart reviewed for concurrent status and case management needs. Will continue to follow patient progress. Confused on iv ms drip,restless, question of hospice versus chemo to be made 24-48hrs. Discharge Planning: following for needs Expected discharge date: [id number] Velva Harman, BSN, [place], [place]

## 2017-01-25 NOTE — Progress Notes (Signed)
Nutrition Follow-up  DOCUMENTATION CODES:   Severe malnutrition in context of acute illness/injury  INTERVENTION:   -Continue Ensure Enlive po BID, each supplement provides 350 kcal and 20 grams of protein (should pt become more alert and intakes remain very poor) -RD will continue to monitor for plan  NUTRITION DIAGNOSIS:   Malnutrition(severe) related to acute illness, chronic illness, lethargy/confusion, catabolic illness, cancer and cancer related treatments as evidenced by energy intake < or equal to 50% for > or equal to 5 days, percent weight loss.  Ongoing.  GOAL:   Patient will meet greater than or equal to 90% of their needs  Not meeting.  MONITOR:   PO intake, Supplement acceptance, Weight trends, Labs, GOC  ASSESSMENT:   51 y.o. female with medical history significant of estrogen positive metastatic breast cancer, diagnosed 2008, she received Gemcitabine for 7 months recently, she has metastasis to liver which has progressed, lung mets stable, brain metastasis. She had MRI brain which showed worsening nodular pachymeningeal/leptomeningeal enhancement over the left cerebral convexity consistent with progressive metastatic disease. She was brought to the hospital with worsening confusion, unable to express herself, she is bilingual ( [place], [place] speaker ) she has only been able to speak in [place]. She has not been eating. She has been very weak and fell at home. She has been having problems with constipation.  7/30: Pt continues to eat poorly. Pt restless and agitated, pt medicated with morphine and ativan. Has not consumed protein supplements d/t not being stable/responsive enough to eat.  Palliative care following, GOC decisions to be made depending on pt's improvement. Per chart, If status improves, will start chemotherapy. Not able to perform NFPE d/t agitation.   7/26: -Pt has been confused and disordered since admission. RN and RT in with pt at this time and  pt appears to be resting comfortably; did not feel it was necessary to arouse pt. Pt was discussed during rounds this AM. -Physical assessment not done at this time with respect to pt's comfort. Per chart review, pt has lost 6 lbs (4.5% body weight) in the past 1 month and she has lost 15 lbs (10.6% body weight) in the past 3 months. Pt meets criteria for malnutrition based on weight loss. Pt has had nothing PO x2 days and suspect that intakes in the few days PTA would have met less than 50% of her estimated needs.   Diet Order:  Diet regular Room service appropriate? Yes; Fluid consistency: Thin  Skin:  Reviewed, no issues  Last BM:  7/29  Height:   Ht Readings from Last 1 Encounters:  01/20/17 _0  (1.676 m)    Weight:   Wt Readings from Last 1 Encounters:  01/25/17 125 lb 7.1 oz (56.9 kg)    Ideal Body Weight:  59.09 kg  BMI:  Body mass index is 20.25 kg/m.  Estimated Nutritional Needs:   Kcal:  2005-2235 (35-39 kcal/kg)  Protein:  103-115 grams (1.8-2 grams/kg)  Fluid:  >/= 2 L/day  EDUCATION NEEDS:   No education needs identified at this time  Clayton Bibles, MS, RD, LDN Pager: [phone] After Hours Pager: [phone]

## 2017-01-25 NOTE — Progress Notes (Signed)
HEMATOLOGY-ONCOLOGY PROGRESS NOTE X-COVER for Dr.Magrinat  SUBJECTIVE: Patient continues to be confused, getting morphine drip She does not answer any questions. She appears to be restless. Husband was bedside.  OBJECTIVE: REVIEW OF SYSTEMS:   Unobtainable because the patient is restless and agitated and not following commands  PHYSICAL EXAMINATION: ECOG PERFORMANCE STATUS: 4 - Bedbound  Vitals:   01/25/17 1000 01/25/17 1100  BP: (!) 147/88 (!) 122/100  Pulse: (!) 123 (!) 105  Resp: 12 10  Temp:     Filed Weights   01/23/17 0540 01/24/17 0525 01/25/17 0422  Weight: 123 lb 14.4 oz (56.2 kg) 126 lb 1.7 oz (57.2 kg) 125 lb 7.1 oz (56.9 kg)    GENERAL:Restless and irritable SKIN: skin color, texture, turgor are normal, no rashes or significant lesions NECK: supple, thyroid normal size, non-tender, without nodularity LUNGS: clear to auscultation and percussion  HEART: regular rate & rhythm and no murmurs ABDOMEN:abdomen soft Musculoskeletal:no cyanosis of digits and no clubbing  NEURO: Not examined  LABORATORY DATA:  I have reviewed the data as listed CMP Latest Ref Rng & Units 01/25/2017 01/23/2017 01/22/2017  Glucose 65 - 99 mg/dL 163(H) 139(H) 154(H)  BUN 6 - 20 mg/dL 30(H) 36(H) 38(H)  Creatinine 0.44 - 1.00 mg/dL 0.64 0.98 1.31(H)  Sodium 135 - 145 mmol/L 144 147(H) 147(H)  Potassium 3.5 - 5.1 mmol/L 5.2(H) 3.8 3.2(L)  Chloride 101 - 111 mmol/L 113(H) 111 107  CO2 22 - 32 mmol/L 27 29 31   Calcium 8.9 - 10.3 mg/dL 8.1(L) 8.9 9.6  Total Protein 6.5 - 8.1 g/dL - - 5.8(L)  Total Bilirubin 0.3 - 1.2 mg/dL - - 0.8  Alkaline Phos 38 - 126 U/L - - 165(H)  AST 15 - 41 U/L - - 105(H)  ALT 14 - 54 U/L - - 47    Lab Results  Component Value Date   WBC 10.5 01/25/2017   HGB 10.5 (L) 01/25/2017   HCT 34.5 (L) 01/25/2017   MCV 101.2 (H) 01/25/2017   PLT 101 (L) 01/25/2017   NEUTROABS 14.6 (H) 01/23/2017    ASSESSMENT AND PLAN: 1. Metastatic breast cancer: Previously  progressed on multiple lines of therapy. Patient has leptomeningeal carcinomatosis and had received whole brain radiation. CT of the head done on 01/18/2017 revealed worsening nodularity in the leptomeningeal area. Dr. Virgie Dad plan for her was to see how she does with her confusion. If her mental status improved and he was planning on treating her with additional chemotherapy. I reiterated this plan with the patient's husband.  I recommended watchful monitoring for couple more days and then make a decision for hospice care. Palliative care is been extremely helpful in treatment for symptoms with morphine drip.  2. hypercalcemia: Resolved Hypercalcemia was not the cause of her mental status changes. It is most likely related to the meningeal/brain metastases.  I will see the patient and her husband over the next couple of days to decide which way we will go. Hospice versus additional chemotherapy

## 2017-01-25 NOTE — Progress Notes (Signed)
Patient remains restless/agitated at times. Husband remains at bedside. RN heard bed alarm going off and went to room. The patient was attempting to get OOB again with husband holding her in. She was agitated at this time. We repositioned her in the bed and I gave a Morphine bolus 0.5 mg.

## 2017-01-26 DIAGNOSIS — Z515 Encounter for palliative care: Secondary | ICD-10-CM

## 2017-01-26 DIAGNOSIS — Z7189 Other specified counseling: Secondary | ICD-10-CM

## 2017-01-26 DIAGNOSIS — G893 Neoplasm related pain (acute) (chronic): Secondary | ICD-10-CM

## 2017-01-26 LAB — BASIC METABOLIC PANEL
ANION GAP: 3 — AB (ref 5–15)
BUN: 28 mg/dL — ABNORMAL HIGH (ref 6–20)
CO2: 28 mmol/L (ref 22–32)
CREATININE: 0.74 mg/dL (ref 0.44–1.00)
Calcium: 8 mg/dL — ABNORMAL LOW (ref 8.9–10.3)
Chloride: 112 mmol/L — ABNORMAL HIGH (ref 101–111)
GLUCOSE: 162 mg/dL — AB (ref 65–99)
Potassium: 4.3 mmol/L (ref 3.5–5.1)
Sodium: 143 mmol/L (ref 135–145)

## 2017-01-26 LAB — CBC
HCT: 34.2 % — ABNORMAL LOW (ref 36.0–46.0)
Hemoglobin: 10.6 g/dL — ABNORMAL LOW (ref 12.0–15.0)
MCH: 30.6 pg (ref 26.0–34.0)
MCHC: 31 g/dL (ref 30.0–36.0)
MCV: 98.8 fL (ref 78.0–100.0)
PLATELETS: 115 10*3/uL — AB (ref 150–400)
RBC: 3.46 MIL/uL — ABNORMAL LOW (ref 3.87–5.11)
RDW: 18.3 % — ABNORMAL HIGH (ref 11.5–15.5)
WBC: 10.1 10*3/uL (ref 4.0–10.5)

## 2017-01-26 NOTE — Progress Notes (Signed)
Patient is restless, family does not want her to have PRN ativan at this time. HR 140, b/p 182/122 nursing staff recommend that PRN Metoprolol be given at this time. Family member refused. Increased Morphine to 6mg /ml an hour and bolus per order given. Patient appeared to relax a little HR 120.

## 2017-01-26 NOTE — Progress Notes (Signed)
HEMATOLOGY-ONCOLOGY PROGRESS NOTE  SUBJECTIVE: Patient has been heavily sedated and is on increased dosage of morphine drip  OBJECTIVE: REVIEW OF SYSTEMS:  Unobtainable because of patient is unresponsive on sedation  PHYSICAL EXAMINATION: ECOG PERFORMANCE STATUS: 4 - Bedbound  Vitals:   01/26/17 1200 01/26/17 1400  BP: 121/89 134/83  Pulse: 82 94  Resp: 11 10  Temp: 98.5 F (36.9 C)    Filed Weights   01/24/17 0525 01/25/17 0422 01/26/17 0337  Weight: 126 lb 1.7 oz (57.2 kg) 125 lb 7.1 oz (56.9 kg) 126 lb 15.8 oz (57.6 kg)    GENERAL:alert, no distress and comfortable NECK: supple, thyroid normal size, non-tender, without nodularity LYMPH:  no palpable lymphadenopathy in the cervical, axillary or inguinal LUNGS: clear to auscultation and percussion with normal breathing effort HEART: regular rate & rhythm and no murmurs and no lower extremity edema ABDOMENMildly distended Musculoskeletal:no cyanosis of digits and no clubbing  NEURO: Patient has been sedated  LABORATORY DATA:  I have reviewed the data as listed CMP Latest Ref Rng & Units 01/26/2017 01/25/2017 01/23/2017  Glucose 65 - 99 mg/dL 162(H) 163(H) 139(H)  BUN 6 - 20 mg/dL 28(H) 30(H) 36(H)  Creatinine 0.44 - 1.00 mg/dL 0.74 0.64 0.98  Sodium 135 - 145 mmol/L 143 144 147(H)  Potassium 3.5 - 5.1 mmol/L 4.3 5.2(H) 3.8  Chloride 101 - 111 mmol/L 112(H) 113(H) 111  CO2 22 - 32 mmol/L 28 27 29   Calcium 8.9 - 10.3 mg/dL 8.0(L) 8.1(L) 8.9  Total Protein 6.5 - 8.1 g/dL - - -  Total Bilirubin 0.3 - 1.2 mg/dL - - -  Alkaline Phos 38 - 126 U/L - - -  AST 15 - 41 U/L - - -  ALT 14 - 54 U/L - - -    Lab Results  Component Value Date   WBC 10.1 01/26/2017   HGB 10.6 (L) 01/26/2017   HCT 34.2 (L) 01/26/2017   MCV 98.8 01/26/2017   PLT 115 (L) 01/26/2017   NEUTROABS 14.6 (H) 01/23/2017    ASSESSMENT AND PLAN: 1. Metastatic breast cancer: Leptomeningeal Carcinomatosis, extensive bone metastases causing diffuse bone  pain I discussed with the patient's husband that we need to get hospice on board. Patient does not have any chance of meaningful recovery. He is in agreement and will discuss with palliative care for the rest of the best option for hospice care.

## 2017-01-26 NOTE — Progress Notes (Signed)
Daily Progress Note   Patient Name: Jill Johnston       Date: 01/26/2017 DOB: [birth year]  Age: 50 y.o. MRN#: [id number] Attending Physician: Loistine Chance, MD Primary Care Physician: Jana Hakim Virgie Dad, MD Admit Date: 01/20/2017  Reason for Consultation/Follow-up: Establishing goals of care  Subjective:  Patient somnolent during encounter.  I met with her husband outside of the room to discuss clinical course as well as care plan moving forward.   See below:   Length of Stay: 6  Current Medications: Scheduled Meds:  . Chlorhexidine Gluconate Cloth  6 each Topical Q0600  . dexamethasone  10 mg Intravenous Q12H  . enoxaparin (LOVENOX) injection  40 mg Subcutaneous Q24H  . feeding supplement (ENSURE ENLIVE)  237 mL Oral BID BM  . lactulose  30 g Oral Daily  . multivitamin with minerals  1 tablet Oral Daily  . pantoprazole (PROTONIX) IV  40 mg Intravenous QHS  . polyethylene glycol  17 g Oral Daily  . senna  1 tablet Oral BID  . sodium chloride flush  3 mL Intravenous Q12H    Continuous Infusions: . ceFEPime (MAXIPIME) IV Stopped (01/26/17 1222)  . dextrose 5 % and 0.45% NaCl 75 mL/hr at 01/26/17 1500  . levETIRAcetam Stopped (01/26/17 0359)  . morphine 0.5 mg/hr (01/26/17 0739)  . sodium chloride      PRN Meds: acetaminophen **OR** acetaminophen, bisacodyl, hydrALAZINE, LORazepam, metoprolol tartrate, morphine injection, morphine **AND** morphine, ondansetron **OR** ondansetron (ZOFRAN) IV, prochlorperazine, promethazine  Physical Exam         Weak, appears chronically ill; does not arouse to gentle verbal stimulation Dry mouth Regular breathing S1 S2 Abdomen soft Trace edema  Vital Signs: BP 134/83   Pulse 94   Temp 98.5 F (36.9 C) (Axillary)   Resp 10   Ht 5' 6"   (1.676 m)   Wt 57.6 kg (126 lb 15.8 oz)   SpO2 96%   BMI 20.50 kg/m  SpO2: SpO2: 96 % O2 Device: O2 Device: Not Delivered O2 Flow Rate: O2 Flow Rate (L/min): 3 L/min  Intake/output summary:   Intake/Output Summary (Last 24 hours) at 01/26/17 1527 Last data filed at 01/26/17 1500  Gross per 24 hour  Intake          2319.23 ml  Output  360 ml  Net          1959.23 ml   LBM: Last BM Date: 01/24/17 Baseline Weight: Weight: 56.7 kg (125 lb) Most recent weight: Weight: 57.6 kg (126 lb 15.8 oz)       Palliative Assessment/Data:    Flowsheet Rows     Most Recent Value  Intake Tab  Referral Department  Hospitalist  Unit at Time of Referral  Intermediate Care Unit  Palliative Care Primary Diagnosis  Cancer  Palliative Care Type  New Palliative care  Reason for referral  Pain, Non-pain Symptom, Counsel Regarding Hospice, Psychosocial or Spiritual support, Clarify Goals of Care, End of Life Care Assistance  Date first seen by Palliative Care  01/22/17  Clinical Assessment  Palliative Performance Scale Score  20%  Pain Max last 24 hours  5  Pain Min Last 24 hours  4  Dyspnea Max Last 24 Hours  5  Dyspnea Min Last 24 hours  4  Psychosocial & Spiritual Assessment  Palliative Care Outcomes  Patient/Family meeting held?  Yes  Who was at the meeting?  patient's husband.   Palliative Care Outcomes  Clarified goals of care      Patient Active Problem List   Diagnosis Date Noted  . Acute encephalopathy   . Protein-calorie malnutrition, severe 01/21/2017  . AKI (acute kidney injury) ([place])   . Constipation   . Dehydration   . Hypercalcemia of malignancy   . Acute metabolic encephalopathy   . Hypoxia   . Sepsis ([place]) 01/20/2017  . Hypercalcemia 01/20/2017  . Malignant neoplasm of upper-inner quadrant of left breast in female, estrogen receptor positive ([place]) 10/30/2016  . Metastatic cancer ([place]) 05/17/2016  . HCAP (healthcare-associated pneumonia) 05/17/2016  .  Genetic testing 05/11/2016  . Chemotherapy-induced thrombocytopenia 05/10/2016  . Leukopenia due to antineoplastic chemotherapy ([place]) 05/10/2016  . Metastasis to liver ([place]) 04/24/2016  . Breast cancer metastasized to lung, left ([place]) 04/24/2016  . Portacath in place 04/24/2016  . Oral thrush 04/24/2016  . Numbness 04/16/2016  . Essential hypertension 04/16/2016  . Carcinoma of left breast metastatic to multiple sites ([place]) 04/12/2016  . Leptomeningeal metastases ([place]) 03/23/2016  . 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 Gastroesophageal reflux disease with esophagitis 03/20/2016  . Drug-induced leukopenia ([place]) 01/26/2016  . High risk medication use 01/26/2016  . Lymphedema of left arm 01/12/2016  . Status post cholecystectomy 01/12/2016  . Cancer associated pain 01/12/2016  . Breast cancer metastasized to bone, left ([place]) 01/12/2016  . Gastroesophageal reflux disease 07/04/2015  . Bone metastases ([place]) 01/07/2015  . Malignant neoplasm of left breast [facility])     Palliative Care Assessment & Plan   Patient Profile:    Assessment:  metastatic breast cancer Hypercalcemia Uncontrolled symptoms: generalized pain, restlessness Ongoing multi factorial encephalopathy HCAP  Recommendations/Plan:  Pain: Continue to titrate morphine continuous infusion as needed for comfort for management of pain and dyspnea.  Currently on 62m/hour and appears to be resting comfortably on my examination.  I did suggest increase in bolus dose to 179mas needed from current dose of 0.59m81ms needed.  Her husband has been very involved in her care and decisions regarding medication administration and would like to consider this further prior to making any further changes.   Continue low dose Ativan for symptom management of restlessness.    Continue to support husband and family with gentle guidance of conversations as emotionally able.  Her husband is struggling with her acute  decline and unclear answer as to exactly why she worsened so quickly (he was very hopeful she would improve with correction of calcium, but unfortunately this was not the case).   He spoke with oncology this AM about recommendation to consider hospice.  He reports that she has been reluctant to hospice in the past as she is from [place] and "hospices there take terrible care of people."  He reports that he does not think that he will be able to care for her at home.  I suggested that he visit a residential hospice facility (such as [facility] or [facility] in [place]) to see the type of care that is given.  We discussed hospice care and goal of focusing on comfort and symptom management.  We discussed that aggressive symptom management is always the goal, but a shift to residential hospice generally entails discontinuing hospital level interventions if they are not focused on her comfort (such as routine labwork, IV abx, or IVF).  He then reported that he was not ready to change her care plan at this time, but he is agreeable to me reaching out to [facility] to see if they can give more guidance on what her care plan may look like to help him make a more informed decision.  He specifically noted that he would not agree to a care plan that did not include continued indefinite IV fluid support.   Plan to follow up tomorrow to continue to discuss hospice services along while remaining hopeful that her mental status and her oral intake will improve.  Appreciate oncology follow up.  Discussed with Dr. Rowe Pavy who knows patient from palliative service but is rounding on her today from hospitalist service.     Code Status:    Code Status Orders        Start     Ordered   01/22/17 1346  Limited resuscitation (code)  Continuous    Question Answer Comment  In the event of cardiac or respiratory ARREST: Initiate Code Blue, Call Rapid Response Yes   In the event of cardiac or respiratory ARREST: Perform CPR No     In the event of cardiac or respiratory ARREST: Perform Intubation/Mechanical Ventilation No   In the event of cardiac or respiratory ARREST: Use NIPPV/BiPAp only if indicated Yes   In the event of cardiac or respiratory ARREST: Administer ACLS medications if indicated Yes   In the event of cardiac or respiratory ARREST: Perform Defibrillation or Cardioversion if indicated Yes   Comments discussed with husband, who is HCPOA      01/22/17 1346    Code Status History    Date Active Date Inactive Code Status Order ID Comments User Context   01/20/2017  6:05 PM 01/22/2017  1:46 PM Full Code [id number]  Elmarie Shiley, MD Inpatient   05/17/2016 12:36 PM 05/20/2016  3:57 PM Full Code [id number]  Mendel Corning, MD Inpatient   04/16/2016  6:00 PM 04/17/2016  6:44 PM DNR [id number]  Gordy Levan, MD Inpatient   04/16/2016  3:06 AM 04/16/2016  6:00 PM Full Code [id number]  Rise Patience, MD Inpatient   10/01/2014  9:32 AM 10/02/2014  3:43 AM Full Code [id number]  Marybelle Killings, MD HOV       Prognosis:   guarded, less than 2 weeks.   Discharge Planning:  To Be Determined  Care plan was discussed with  Husband, Dr. Rowe Pavy, Erling Conte from [facility] Thank you for allowing the Palliative  Medicine Team to assist in the care of this patient.   Time In: 1345 Time Out: 1430 Total Time 45 Prolonged Time Billed  no       Greater than 50%  of this time was spent counseling and coordinating care related to the above assessment and plan.  Micheline Rough, MD  [phone]  Please contact Palliative Medicine Team phone at [phone] for questions and concerns.

## 2017-01-26 NOTE — Progress Notes (Signed)
PROGRESS NOTE    [name]  OTL:[id number] DOB: Jun 20, 1966 DOA: 01/20/2017 PCP: [name], MD    Brief Narrative:  Jill Johnston is a 51 y.o. female with medical history significant of estrogen positive metastatic breast cancer, diagnosed 2008, status post treatment with Gemcitabine 7 months ago, presenting with increasing confusion. She has metastasis to liver which has progressed, lung mets stable, brain metastasis, plan was to start ([facility]) by her oncologist. She had MRI brain which showed Worsening nodular pachymeningeal/leptomeningeal enhancement over the left cerebral convexity consistent with progressive metastatic disease.   Evaluation revealed pneumonia with acute renal failure associated with hypercalcemia     Assessment & Plan:   Active Problems:   Malignant neoplasm of left breast (HCC)   Breast cancer metastasized to bone, left (HCC)   Essential hypertension   Portacath in place   HCAP (healthcare-associated pneumonia)   Sepsis (Jill Johnston)   Hypercalcemia   Protein-calorie malnutrition, severe   AKI (acute kidney injury) ([employer])   Constipation   Dehydration   Hypercalcemia of malignancy   Acute metabolic encephalopathy   Hypoxia  1 Acute metabolic encephalopathy -Toxic metabolic etiology due to combination of hypercalcemia with renal failure as well as pneumonia.  -EEG 01/22/17 -diffuse cerebral dysfunction,  non-specific, limited due to muscle artifact obscuring the bilateral temporal regions throughout most of the study. -Supportive care for now -ativan dose decreased. PRN  -continue with keppra, decadron.  -still agitated and restless at times.   2  metastatic breast cancer to the brain, liver, bone Patient with progression of metastatic disease to the bone.  Continue with  IV Keppra and IV Decadron.  Poor prognosis. Hematology/oncology following Palliative medicine assisting with symptom mx/goals of care.  3-symptomatic hypercalcemia of  malignancy Patient status post zometa 4 mg IV 1.  Received calcitonin 200 units IM twice a day for total of 4 doses.  Received IV fluids and lasix.  Calcium has decreased to 8.1,, corrected by albumin 9.7 Bow off IV lasix.   3 HCAP/pneumonia Chest x-ray- some bilateral infiltrates.   MRSA PCR negative -discontinued IV vancomycin as MRSA PCR is negative.  Continue IV antibiotics Follow-up blood cultures; No growth to date.  WBC normalized now.    hypertension PRN hydralazine.  BP increases when she is agitated.   6 acute kidney injury Creatinine on admission was 1.7.  Acute prerenal azotemia secondary to volume depletion.  Improved.   7 transaminitis Likely secondary to metastases to the liver. Follow clinically  8 constipation. Laxatives when necessary Had BM during admission.   9 severe protein calorie malnutrition Nutritional support  11 hypernatremia Resolved.      IV fluids to D 5 , ns.   DVT prophylaxis: Lovenox Code Status: Full Family Communication: Updated husband at bedside.  Disposition Plan: Remain in the step unit for now. Disposition undetermined at this time.   Consultants:   Hematology/oncology Dr.Magrinat 01/20/2017  Palliative care   Procedures:   CT head 01/20/2017  Chest x-ray 01/20/2017  Abdominal ultrasound 01/20/2017  EEG 01/22/17  Antimicrobials:   IV cefepime 01/20/2017  IV vancomycin 01/20/2017>>>> 01/21/2017   Subjective: Still restless at times.  Some what more awake today Husband at bedside, I discussed with him.   Objective: Vitals:   01/26/17 0800 01/26/17 1000 01/26/17 1200 01/26/17 1400  BP: (!) 146/123 106/85 121/89 134/83  Pulse: (!) 126 (!) 105 82 94  Resp: (!) 23 11 11 10   Temp: 98.7 F (37.1 C)  98.5 F (36.9 C)  TempSrc: Axillary  Axillary   SpO2: (!) 88% 94% 96% 96%  Weight:      Height:        Intake/Output Summary (Last 24 hours) at 01/26/17 1719 Last data filed at 01/26/17 1500  Gross  per 24 hour  Intake             2121 ml  Output              360 ml  Net             1761 ml   Filed Weights   01/24/17 0525 01/25/17 0422 01/26/17 0337  Weight: 57.2 kg (126 lb 1.7 oz) 56.9 kg (125 lb 7.1 oz) 57.6 kg (126 lb 15.8 oz)    Examination:  General exam: appears in pain, Respiratory system: Respiratory effort normal.  Cardiovascular system: tachycardia. S1, S 2 RRR Gastrointestinal system: Abdomen soft, nt Central nervous system: keeps eyes closes, moves extremities.  Extremities; no cyanosis or edema Skin: No rashes, lesions or ulcers Psychiatry: Unable to assess.    Data Reviewed: I have personally reviewed following labs and imaging studies  CBC:  Recent Labs Lab 01/21/17 0524 01/22/17 0422 01/23/17 0430 01/25/17 0414 01/26/17 0341  WBC 6.8 14.6* 16.2* 10.5 10.1  NEUTROABS  --  13.2* 14.6*  --   --   HGB 11.4* 11.9* 11.3* 10.5* 10.6*  HCT 34.9* 38.5 36.2 34.5* 34.2*  MCV 96.7 98.2 99.5 101.2* 98.8  PLT 110* 142* 134* 101* 008*   Basic Metabolic Panel:  Recent Labs Lab 01/21/17 0524 01/22/17 0422 01/23/17 0430 01/25/17 0414 01/26/17 0341  NA 142 147* 147* 144 143  K 3.4* 3.2* 3.8 5.2* 4.3  CL 106 107 111 113* 112*  CO2 29 31 29 27 28   GLUCOSE 130* 154* 139* 163* 162*  BUN 31* 38* 36* 30* 28*  CREATININE 1.40* 1.31* 0.98 0.64 0.74  CALCIUM 11.2* 9.6 8.9 8.1* 8.0*  MG 1.5* 2.5*  --   --   --    GFR: Estimated Creatinine Clearance: 75.7 mL/min (by C-G formula based on SCr of 0.74 mg/dL). Liver Function Tests:  Recent Labs Lab 01/20/17 1156 01/21/17 0524 01/22/17 0422  AST 134* 123* 105*  ALT 49 48 47  ALKPHOS 221* 174* 165*  BILITOT 1.9* 1.2 0.8  PROT 6.9 5.7* 5.8*  ALBUMIN 2.8* 2.3* 2.4*   No results for input(s): LIPASE, AMYLASE in the last 168 hours.  Recent Labs Lab 01/20/17 1156 01/21/17 0930  AMMONIA 38* 29   Coagulation Profile: No results for input(s): INR, PROTIME in the last 168 hours. Cardiac Enzymes: No  results for input(s): CKTOTAL, CKMB, CKMBINDEX, TROPONINI in the last 168 hours. BNP (last 3 results) No results for input(s): PROBNP in the last 8760 hours. HbA1C: No results for input(s): HGBA1C in the last 72 hours. CBG:  Recent Labs Lab 01/20/17 1213  GLUCAP 98   Lipid Profile: No results for input(s): CHOL, HDL, LDLCALC, TRIG, CHOLHDL, LDLDIRECT in the last 72 hours. Thyroid Function Tests: No results for input(s): TSH, T4TOTAL, FREET4, T3FREE, THYROIDAB in the last 72 hours. Anemia Panel: No results for input(s): VITAMINB12, FOLATE, FERRITIN, TIBC, IRON, RETICCTPCT in the last 72 hours. Sepsis Labs:  Recent Labs Lab 01/20/17 1207 01/20/17 1615 01/20/17 1954 01/21/17 0524  LATICACIDVEN 3.15* 2.9* 2.1* 1.6    Recent Results (from the past 240 hour(s))  Urine culture     Status: Abnormal   Collection Time: 01/20/17 11:43 AM  Result Value Ref  Range Status   Specimen Description URINE, CATHETERIZED  Final   Special Requests Immunocompromised  Final   Culture MULTIPLE SPECIES PRESENT, SUGGEST RECOLLECTION (A)  Final   Report Status 01/21/2017 FINAL  Final  Culture, blood (routine x 2)     Status: None   Collection Time: 01/20/17 11:56 AM  Result Value Ref Range Status   Specimen Description BLOOD PORT  Final   Special Requests   Final    BOTTLES DRAWN AEROBIC AND ANAEROBIC Blood Culture adequate volume   Culture   Final    NO GROWTH 5 DAYS Performed at [facility], [address] [place], [place] [address]    Report Status 01/25/2017 FINAL  Final  Culture, blood (routine x 2)     Status: None   Collection Time: 01/20/17 12:15 PM  Result Value Ref Range Status   Specimen Description BLOOD RIGHT HAND  Final   Special Requests   Final    BOTTLES DRAWN AEROBIC AND ANAEROBIC Blood Culture adequate volume   Culture   Final    NO GROWTH 5 DAYS Performed at [facility], [place] [address] [place], [place] [address]    Report Status 01/25/2017 FINAL  Final   MRSA PCR Screening     Status: None   Collection Time: 01/20/17  6:20 PM  Result Value Ref Range Status   MRSA by PCR NEGATIVE NEGATIVE Final    Comment:        The GeneXpert MRSA Assay (FDA approved for NASAL specimens only), is one component of a comprehensive MRSA colonization surveillance program. It is not intended to diagnose MRSA infection nor to guide or monitor treatment for MRSA infections.          Radiology Studies: No results found.      Scheduled Meds: . Chlorhexidine Gluconate Cloth  6 each Topical Q0600  . dexamethasone  10 mg Intravenous Q12H  . enoxaparin (LOVENOX) injection  40 mg Subcutaneous Q24H  . feeding supplement (ENSURE ENLIVE)  237 mL Oral BID BM  . lactulose  30 g Oral Daily  . multivitamin with minerals  1 tablet Oral Daily  . pantoprazole (PROTONIX) IV  40 mg Intravenous QHS  . polyethylene glycol  17 g Oral Daily  . senna  1 tablet Oral BID  . sodium chloride flush  3 mL Intravenous Q12H   Continuous Infusions: . ceFEPime (MAXIPIME) IV Stopped (01/26/17 1222)  . dextrose 5 % and 0.45% NaCl 75 mL/hr at 01/26/17 1500  . levETIRAcetam 500 mg (01/26/17 1700)  . morphine 0.5 mg/hr (01/26/17 0739)  . sodium chloride       LOS: 6 days    Time spent: 25 mins    Loistine Chance, MD [facility] Pager [phone]  If 7PM-7AM, please contact night-coverage [email or web address] Password [email or web address] 01/26/2017, 5:19 PM

## 2017-01-26 NOTE — Progress Notes (Signed)
Pt. has been restless trying to get OOB. Since the beginning of the shift ,patient has received a total of 2 boluses of PRN morphine and PRN dose of Ativan, with continuous infusion of Morphine at 5mg /hr. Pt.is resting now and appears comfortable.

## 2017-01-27 LAB — CBC WITH DIFFERENTIAL/PLATELET
BASOS ABS: 0 10*3/uL (ref 0.0–0.1)
Basophils Absolute: 0 10*3/uL (ref 0.0–0.1)
Basophils Relative: 0 %
Basophils Relative: 0 %
EOS ABS: 0 10*3/uL (ref 0.0–0.7)
EOS PCT: 0 %
Eosinophils Absolute: 0 10*3/uL (ref 0.0–0.7)
Eosinophils Relative: 0 %
HCT: 30.7 % — ABNORMAL LOW (ref 36.0–46.0)
HCT: 37.1 % (ref 36.0–46.0)
HEMOGLOBIN: 11.5 g/dL — AB (ref 12.0–15.0)
Hemoglobin: 9.6 g/dL — ABNORMAL LOW (ref 12.0–15.0)
LYMPHS ABS: 0.5 10*3/uL — AB (ref 0.7–4.0)
LYMPHS ABS: 0.7 10*3/uL (ref 0.7–4.0)
LYMPHS PCT: 3 %
LYMPHS PCT: 4 %
MCH: 31.5 pg (ref 26.0–34.0)
MCH: 30.5 pg (ref 26.0–34.0)
MCHC: 31.3 g/dL (ref 30.0–36.0)
MCHC: 31 g/dL (ref 30.0–36.0)
MCV: 100.7 fL — AB (ref 78.0–100.0)
MCV: 98.4 fL (ref 78.0–100.0)
MONO ABS: 0.6 10*3/uL (ref 0.1–1.0)
MONOS PCT: 4 %
Monocytes Absolute: 0.6 10*3/uL (ref 0.1–1.0)
Monocytes Relative: 4 %
NEUTROS PCT: 92 %
NEUTROS PCT: 92 %
Neutro Abs: 12.4 10*3/uL — ABNORMAL HIGH (ref 1.7–7.7)
Neutro Abs: 14.8 10*3/uL — ABNORMAL HIGH (ref 1.7–7.7)
PLATELETS: 97 10*3/uL — AB (ref 150–400)
Platelets: 124 10*3/uL — ABNORMAL LOW (ref 150–400)
RBC: 3.05 MIL/uL — ABNORMAL LOW (ref 3.87–5.11)
RBC: 3.77 MIL/uL — AB (ref 3.87–5.11)
RDW: 18.3 % — AB (ref 11.5–15.5)
RDW: 18.2 % — ABNORMAL HIGH (ref 11.5–15.5)
WBC: 13.5 10*3/uL — ABNORMAL HIGH (ref 4.0–10.5)
WBC: 16.1 10*3/uL — AB (ref 4.0–10.5)

## 2017-01-27 LAB — BASIC METABOLIC PANEL
ANION GAP: 7 (ref 5–15)
BUN: 24 mg/dL — AB (ref 6–20)
CHLORIDE: 109 mmol/L (ref 101–111)
CO2: 25 mmol/L (ref 22–32)
Calcium: 8.2 mg/dL — ABNORMAL LOW (ref 8.9–10.3)
Creatinine, Ser: 0.67 mg/dL (ref 0.44–1.00)
GFR calc Af Amer: >60 mL/min (ref >60)
Glucose, Bld: 155 mg/dL — ABNORMAL HIGH (ref 65–99)
POTASSIUM: 4.1 mmol/L (ref 3.5–5.1)
SODIUM: 141 mmol/L (ref 135–145)

## 2017-01-27 LAB — COMPREHENSIVE METABOLIC PANEL
ALT: 32 U/L (ref 14–54)
AST: 91 U/L — ABNORMAL HIGH (ref 15–41)
Albumin: 1.7 g/dL — ABNORMAL LOW (ref 3.5–5.0)
Alkaline Phosphatase: 206 U/L — ABNORMAL HIGH (ref 38–126)
Anion gap: 3 — ABNORMAL LOW (ref 5–15)
BUN: 23 mg/dL — ABNORMAL HIGH (ref 6–20)
CHLORIDE: 109 mmol/L (ref 101–111)
CO2: 24 mmol/L (ref 22–32)
CREATININE: 0.67 mg/dL (ref 0.44–1.00)
Calcium: 7.1 mg/dL — ABNORMAL LOW (ref 8.9–10.3)
GFR calc non Af Amer: >60 mL/min (ref >60)
Glucose, Bld: 555 mg/dL (ref 65–99)
POTASSIUM: 3.7 mmol/L (ref 3.5–5.1)
Sodium: 136 mmol/L (ref 135–145)
TOTAL PROTEIN: 4.3 g/dL — AB (ref 6.5–8.1)
Total Bilirubin: 0.6 mg/dL (ref 0.3–1.2)

## 2017-01-27 MED ORDER — MORPHINE BOLUS VIA INFUSION
0.5000 mg | INTRAVENOUS | Status: DC | PRN
  Administered 2017-01-27 – 2017-01-28 (×6): 1 mg via INTRAVENOUS
  Filled 2017-01-27: qty 1

## 2017-01-27 MED ORDER — LORAZEPAM 2 MG/ML IJ SOLN
0.5000 mg | INTRAMUSCULAR | Status: DC | PRN
  Administered 2017-01-27 – 2017-01-28 (×4): 1 mg via INTRAVENOUS
  Filled 2017-01-27 (×4): qty 1

## 2017-01-27 MED ORDER — SODIUM CHLORIDE 0.9 % IV SOLN
1.0000 mg/h | INTRAVENOUS | Status: DC
  Administered 2017-01-27: 9 mg/h via INTRAVENOUS
  Administered 2017-01-27: 8 mg/h via INTRAVENOUS
  Administered 2017-01-27: 1 mg/h via INTRAVENOUS
  Filled 2017-01-27: qty 10

## 2017-01-27 NOTE — Progress Notes (Signed)
PROGRESS NOTE    Jill Johnston  PNT:[id number] DOB: 02-13-1966 DOA: 01/20/2017 PCP: [name], MD    Brief Narrative:  Jill Johnston is a 51 y.o. female with medical history significant of estrogen positive metastatic breast cancer, diagnosed 2008, status post treatment with Gemcitabine 7 months ago, presenting with increasing confusion. She has metastasis to liver which has progressed, lung mets stable, brain metastasis, plan was to start (CMF) by her oncologist. She had MRI brain which showed Worsening nodular pachymeningeal/leptomeningeal enhancement over the left cerebral convexity consistent with progressive metastatic disease.   Evaluation revealed pneumonia with acute renal failure associated with hypercalcemia     Assessment & Plan:   Active Problems:   Malignant neoplasm of left breast (HCC)   Breast cancer metastasized to bone, left (HCC)   Essential hypertension   Portacath in place   HCAP (healthcare-associated pneumonia)   Sepsis ([place])   Hypercalcemia   Protein-calorie malnutrition, severe   AKI (acute kidney injury) ([place])   Constipation   Dehydration   Hypercalcemia of malignancy   Acute metabolic encephalopathy   Hypoxia  1 Acute metabolic encephalopathy -Toxic metabolic etiology due to combination of hypercalcemia with renal failure as well as pneumonia. 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 -Supportive care for now -ativan PRN  -continue with keppra, decadron.  -non verbal, still restless.   2  metastatic breast cancer to the brain, liver, bone Patient with progression of metastatic disease to the bone.  Continue with  IV Keppra and IV Decadron.  Poor prognosis. Hematology/oncology following Palliative medicine assisting with symptom mx/goals of care.  3-symptomatic  hypercalcemia of malignancy Patient status post zometa 4 mg IV 1.  Received calcitonin 200 units IM twice a day for total of 4 doses.  Received IV fluids and lasix.  Calcium has decreased to 8.1,. Resolved. No significant improvement after correction of hypercalcemia.   3 HCAP/pneumonia Chest x-ray- some bilateral infiltrates.   MRSA PCR negative -discontinued IV vancomycin as MRSA PCR is negative.  Patient on cefepime.  Follow-up blood cultures; No growth to date.  Increase WBC, unclear if patient aspirating, also.   Leukocytosis;  Patient had loose stool.  I spoke with Staff, if diarrhea persist, will send sample for C diff.   Hypertension PRN hydralazine.  BP increases when she is agitated.   acute kidney injury Creatinine on admission was 1.7.  Acute prerenal azotemia secondary to volume depletion.  Improved.   7 transaminitis Likely secondary to metastases to the liver. Follow clinically  8 constipation. Laxatives when necessary Had BM during admission. Now with diarrhea.   9 severe protein calorie malnutrition Nutritional support  11 hypernatremia Resolved.      IV fluids to D 5 , ns.   DVT prophylaxis: Lovenox Code Status: Full Family Communication: Updated husband at bedside.  Disposition Plan: Remain in the step unit for now. Disposition undetermined at this time.   Consultants:   Hematology/oncology Dr.Magrinat 01/20/2017  Palliative care   Procedures:   CT head 01/20/2017  Chest x-ray 01/20/2017  Abdominal ultrasound 01/20/2017  EEG 01/22/17  Antimicrobials:   IV cefepime 01/20/2017  IV vancomycin 01/20/2017>>>> 01/21/2017   Subjective: Still restless, in pain this morning. She is non verbal.    Objective: Vitals:   01/27/17 0015 01/27/17 0200 01/27/17 0403 01/27/17 0425  BP:  107/65    Pulse:  95  Resp:  (!) 8    Temp: 98.3 F (36.8 C)  98.6 F (37 C)   TempSrc: Axillary  Axillary   SpO2:  94%    Weight:    60.3 kg  (132 lb 15 oz)  Height:        Intake/Output Summary (Last 24 hours) at 01/27/17 0711 Last data filed at 01/27/17 0555  Gross per 24 hour  Intake          1470.35 ml  Output             1070 ml  Net           400.35 ml   Filed Weights   01/25/17 0422 01/26/17 0337 01/27/17 0425  Weight: 56.9 kg (125 lb 7.1 oz) 57.6 kg (126 lb 15.8 oz) 60.3 kg (132 lb 15 oz)    Examination:  General exam: Mild distress due to pain  Respiratory system: Bilateral air movement, fine crackles.  Cardiovascular system: S 1, S 2 RRR tachycardia Gastrointestinal system: abdomen soft, NT Central nervous system: restless, non verbal.  Extremities; no edema LE Skin: No rashes, lesions or ulcers Psychiatry: Unable to assess.    Data Reviewed: I have personally reviewed following labs and imaging studies  CBC:  Recent Labs Lab 01/22/17 0422 01/23/17 0430 01/25/17 0414 01/26/17 0341 01/27/17 0306 01/27/17 0535  WBC 14.6* 16.2* 10.5 10.1 13.5* 16.1*  NEUTROABS 13.2* 14.6*  --   --  12.4* 14.8*  HGB 11.9* 11.3* 10.5* 10.6* 9.6* 11.5*  HCT 38.5 36.2 34.5* 34.2* 30.7* 37.1  MCV 98.2 99.5 101.2* 98.8 100.7* 98.4  PLT 142* 134* 101* 115* 97* 161*   Basic Metabolic Panel:  Recent Labs Lab 01/21/17 0524 01/22/17 0422 01/23/17 0430 01/25/17 0414 01/26/17 0341 01/27/17 0306 01/27/17 0535  NA 142 147* 147* 144 143 136 141  K 3.4* 3.2* 3.8 5.2* 4.3 3.7 4.1  CL 106 107 111 113* 112* 109 109  CO2 29 31 29 27 28 24 25   GLUCOSE 130* 154* 139* 163* 162* 555* 155*  BUN 31* 38* 36* 30* 28* 23* 24*  CREATININE 1.40* 1.31* 0.98 0.64 0.74 0.67 0.67  CALCIUM 11.2* 9.6 8.9 8.1* 8.0* 7.1* 8.2*  MG 1.5* 2.5*  --   --   --   --   --    GFR: Estimated Creatinine Clearance: 77.9 mL/min (by C-G formula based on SCr of 0.67 mg/dL). Liver Function Tests:  Recent Labs Lab 01/20/17 1156 01/21/17 0524 01/22/17 0422 01/27/17 0306  AST 134* 123* 105* 91*  ALT 49 48 47 32  ALKPHOS 221* 174* 165* 206*    BILITOT 1.9* 1.2 0.8 0.6  PROT 6.9 5.7* 5.8* 4.3*  ALBUMIN 2.8* 2.3* 2.4* 1.7*   No results for input(s): LIPASE, AMYLASE in the last 168 hours.  Recent Labs Lab 01/20/17 1156 01/21/17 0930  AMMONIA 38* 29   Coagulation Profile: No results for input(s): INR, PROTIME in the last 168 hours. Cardiac Enzymes: No results for input(s): CKTOTAL, CKMB, CKMBINDEX, TROPONINI in the last 168 hours. BNP (last 3 results) No results for input(s): PROBNP in the last 8760 hours. HbA1C: No results for input(s): HGBA1C in the last 72 hours. CBG:  Recent Labs Lab 01/20/17 1213  GLUCAP 98   Lipid Profile: No results for input(s): CHOL, HDL, LDLCALC, TRIG, CHOLHDL, LDLDIRECT in the last 72 hours. Thyroid Function Tests: No results for input(s): TSH, T4TOTAL, FREET4, T3FREE, THYROIDAB in the last 72 hours. Anemia Panel: No results for input(s):  VITAMINB12, FOLATE, FERRITIN, TIBC, IRON, RETICCTPCT in the last 72 hours. Sepsis Labs:  Recent Labs Lab 01/20/17 1207 01/20/17 1615 01/20/17 1954 01/21/17 0524  LATICACIDVEN 3.15* 2.9* 2.1* 1.6    Recent Results (from the past 240 hour(s))  Urine culture     Status: Abnormal   Collection Time: 01/20/17 11:43 AM  Result Value Ref Range Status   Specimen Description URINE, CATHETERIZED  Final   Special Requests Immunocompromised  Final   Culture MULTIPLE SPECIES PRESENT, SUGGEST RECOLLECTION (A)  Final   Report Status 01/21/2017 FINAL  Final  Culture, blood (routine x 2)     Status: None   Collection Time: 01/20/17 11:56 AM  Result Value Ref Range Status   Specimen Description BLOOD PORT  Final   Special Requests   Final    BOTTLES DRAWN AEROBIC AND ANAEROBIC Blood Culture adequate volume   Culture   Final    NO GROWTH 5 DAYS Performed at [facility], [place] [address] [place], [place] [address]    Report Status 01/25/2017 FINAL  Final  Culture, blood (routine x 2)     Status: None   Collection Time: 01/20/17 12:15 PM  Result  Value Ref Range Status   Specimen Description BLOOD RIGHT HAND  Final   Special Requests   Final    BOTTLES DRAWN AEROBIC AND ANAEROBIC Blood Culture adequate volume   Culture   Final    NO GROWTH 5 DAYS Performed at [facility], [place] [address] [place], [place] [address]    Report Status 01/25/2017 FINAL  Final  MRSA PCR Screening     Status: None   Collection Time: 01/20/17  6:20 PM  Result Value Ref Range Status   MRSA by PCR NEGATIVE NEGATIVE Final    Comment:        The GeneXpert MRSA Assay (FDA approved for NASAL specimens only), is one component of a comprehensive MRSA colonization surveillance program. It is not intended to diagnose MRSA infection nor to guide or monitor treatment for MRSA infections.          Radiology Studies: No results found.      Scheduled Meds: . Chlorhexidine Gluconate Cloth  6 each Topical Q0600  . dexamethasone  10 mg Intravenous Q12H  . enoxaparin (LOVENOX) injection  40 mg Subcutaneous Q24H  . feeding supplement (ENSURE ENLIVE)  237 mL Oral BID BM  . lactulose  30 g Oral Daily  . multivitamin with minerals  1 tablet Oral Daily  . pantoprazole (PROTONIX) IV  40 mg Intravenous QHS  . polyethylene glycol  17 g Oral Daily  . senna  1 tablet Oral BID  . sodium chloride flush  3 mL Intravenous Q12H   Continuous Infusions: . ceFEPime (MAXIPIME) IV Stopped (01/27/17 0026)  . dextrose 5 % and 0.45% NaCl 75 mL/hr at 01/26/17 1500  . levETIRAcetam Stopped (01/27/17 0322)  . morphine 1 mg/hr (01/27/17 5329)  . sodium chloride       LOS: 7 days    Time spent: 25 mins    Elmarie Shiley, MD [facility] Pager [phone]  If 7PM-7AM, please contact night-coverage [email or web address] Password [email or web address] 01/27/2017, 7:11 AM

## 2017-01-27 NOTE — Progress Notes (Signed)
Daily Progress Note   Patient Name: Jill Johnston       Date: 01/27/2017 DOB: Jun 22, 1966  Age: 51 y.o. MRN#: [id number] Attending Physician: Elmarie Shiley, MD Primary Care Physician: [name], MD Admit Date: 01/20/2017  Reason for Consultation/Follow-up: Establishing goals of care  Subjective:  Patient somnolent during encounter.  I met with her husband outside of the room to discuss clinical course as well as care plan moving forward.  He reports that she had a "rough night" and wants to ensure she has higher dose prn available if she needs it.  She also had another friend present in the room today.   See below:   Length of Stay: 7  Current Medications: Scheduled Meds:  . Chlorhexidine Gluconate Cloth  6 each Topical Q0600  . dexamethasone  10 mg Intravenous Q12H  . enoxaparin (LOVENOX) injection  40 mg Subcutaneous Q24H  . feeding supplement (ENSURE ENLIVE)  237 mL Oral BID BM  . lactulose  30 g Oral Daily  . multivitamin with minerals  1 tablet Oral Daily  . pantoprazole (PROTONIX) IV  40 mg Intravenous QHS  . polyethylene glycol  17 g Oral Daily  . senna  1 tablet Oral BID  . sodium chloride flush  3 mL Intravenous Q12H    Continuous Infusions: . ceFEPime (MAXIPIME) IV Stopped (01/27/17 0026)  . dextrose 5 % and 0.45% NaCl 100 mL/hr at 01/27/17 1025  . levETIRAcetam Stopped (01/27/17 0322)  . morphine    . sodium chloride      PRN Meds: acetaminophen **OR** acetaminophen, bisacodyl, hydrALAZINE, LORazepam, metoprolol tartrate, morphine injection, morphine **AND** morphine, ondansetron **OR** ondansetron (ZOFRAN) IV, prochlorperazine, promethazine  Physical Exam         Weak, appears chronically ill; does not arouse to gentle verbal stimulation Dry  mouth Regular breathing S1 S2 Abdomen soft Trace edema  Vital Signs: BP 107/65   Pulse 95   Temp 98 F (36.7 C) (Axillary)   Resp (!) 8   Ht 5' 6"  (1.676 m)   Wt 60.3 kg (132 lb 15 oz)   SpO2 94%   BMI 21.46 kg/m  SpO2: SpO2: 94 % O2 Device: O2 Device: Not Delivered O2 Flow Rate: O2 Flow Rate (L/min): 3 L/min  Intake/output summary:   Intake/Output Summary (Last 24 hours) at  01/27/17 1130 Last data filed at 01/27/17 0555  Gross per 24 hour  Intake          1452.35 ml  Output             1070 ml  Net           382.35 ml   LBM: Last BM Date: 01/24/17 Baseline Weight: Weight: 56.7 kg (125 lb) Most recent weight: Weight: 60.3 kg (132 lb 15 oz)       Palliative Assessment/Data:    Flowsheet Rows     Most Recent Value  Intake Tab  Referral Department  Hospitalist  Unit at Time of Referral  Intermediate Care Unit  Palliative Care Primary Diagnosis  Cancer  Palliative Care Type  New Palliative care  Reason for referral  Pain, Non-pain Symptom, Counsel Regarding Hospice, Psychosocial or Spiritual support, Clarify Goals of Care, End of Life Care Assistance  Date first seen by Palliative Care  01/22/17  Clinical Assessment  Palliative Performance Scale Score  20%  Pain Max last 24 hours  5  Pain Min Last 24 hours  4  Dyspnea Max Last 24 Hours  5  Dyspnea Min Last 24 hours  4  Psychosocial & Spiritual Assessment  Palliative Care Outcomes  Patient/Family meeting held?  Yes  Who was at the meeting?  patient's husband.   Palliative Care Outcomes  Clarified goals of care      Patient Active Problem List   Diagnosis Date Noted  . Acute encephalopathy   . Protein-calorie malnutrition, severe 01/21/2017  . AKI (acute kidney injury) ([place])   . Constipation   . Dehydration   . Hypercalcemia of malignancy   . Acute metabolic encephalopathy   . Hypoxia   . Sepsis ([place]) 01/20/2017  . Hypercalcemia 01/20/2017  . Malignant neoplasm of upper-inner quadrant of left breast  in female, estrogen receptor positive ([place]) 10/30/2016  . Metastatic cancer ([place]) 05/17/2016  . HCAP (healthcare-associated pneumonia) 05/17/2016  . Genetic testing 05/11/2016  . Chemotherapy-induced thrombocytopenia 05/10/2016  . Leukopenia due to antineoplastic chemotherapy ([place]) 05/10/2016  . Metastasis to liver ([place]) 04/24/2016  . Breast cancer metastasized to lung, left ([place]) 04/24/2016  . Portacath in place 04/24/2016  . Oral thrush 04/24/2016  . Numbness 04/16/2016  . Essential hypertension 04/16/2016  . Carcinoma of left breast metastatic to multiple sites ([place] 04/12/2016  . 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 Adenocarcinoma of breast metastatic to liver ([place]) 03/20/2016  . Gastroesophageal reflux disease with esophagitis 03/20/2016  . Drug-induced leukopenia ([place]) 01/26/2016  . High risk medication use 01/26/2016  . Lymphedema of left arm 01/12/2016  . Status post cholecystectomy 01/12/2016  . Cancer associated pain 01/12/2016  . Breast cancer metastasized to bone, left ([place]) 01/12/2016  . Gastroesophageal reflux disease 07/04/2015  . Bone metastases ([place]) 01/07/2015  . Malignant neoplasm of left breast [facility])     Palliative Care Assessment & Plan   Patient Profile:    Assessment:  metastatic breast cancer Hypercalcemia Uncontrolled symptoms: generalized pain, restlessness Ongoing multi factorial encephalopathy HCAP  Recommendations/Plan:  Pain: Continue to titrate morphine continuous infusion as needed for comfort for management of pain and dyspnea.  Currently on 30m/hour (up from 660mhr yesterday) and appears to be resting comfortably on my examination.  I talked again with her husband about increasing PRN and did increase bolus dose to 0.5-12m44ms needed (from current dose of 0.5mg48m needed).   Ativan seems to be working well for symptom management of restlessness.  Increased to 0.5-42m Ativan Q4 hours prn.     He spoke with oncology yesterday about  recommendation to consider hospice.  He reports that [place] been [place]  He is much more open to idea of residential hospice today (discussed again that transition to residential hospice would mean discontinuing hospital level interventions if they are not focused on her comfort (such as routine labwork, IV abx, IVF, likely change in keppra to scheduled Ativan).  [facility] care that [facility] given and he reports wanting to go this afternoon.  I called and [facility] planning to [facility] facility.  Plan for follow-up once he has visited residential hospice facility.     Code Status:    Code Status Orders        Start     Ordered   01/22/17 1346  Limited resuscitation (code)  Continuous    Question Answer Comment  In the event of cardiac or respiratory ARREST: Initiate Code Blue, Call Rapid Response Yes   In the event of cardiac or respiratory ARREST: Perform CPR No   In the event of cardiac or respiratory ARREST: Perform Intubation/Mechanical Ventilation No   In the event of cardiac or respiratory ARREST: Use NIPPV/BiPAp only if indicated Yes   In the event of cardiac or respiratory ARREST: Administer ACLS medications if indicated Yes   In the event of cardiac or respiratory ARREST: Perform Defibrillation or Cardioversion if indicated Yes   Comments discussed with husband, who is HCPOA      01/22/17 1346    Code Status History    Date Active Date Inactive Code Status Order ID Comments User Context   01/20/2017  6:05 PM 01/22/2017  1:46 PM Full Code [id number] RElmarie Shiley MD Inpatient   05/17/2016 12:36 PM 05/20/2016  3:57 PM Full Code [id number] RMendel Corning MD Inpatient   04/16/2016  6:00 PM 04/17/2016  6:44 PM DNR [id number] LGordy Levan MD Inpatient   04/16/2016  3:06 AM 04/16/2016  6:00 PM Full Code  [id number] KRise Patience MD Inpatient   10/01/2014  9:32 AM 10/02/2014  3:43 AM Full Code [id number] HMarybelle Killings MD HOV       Prognosis:  < 2 weeks.  She is now unresponsive and requiring increasing doses of medications to remain comfortable.  As her prognosis is less than 2 weeks and she is having increasing symptom burden, I believe that she would best bes served by residential hospice placement if family is agreeable and goal becomes full comfort care.  Discharge Planning:  To Be Determined  Care plan was discussed with  Hus[facility], [facility] RTyrell AntonioThank you for allowing the Palliative Medicine Team to assist in the care of this patient.   Time In: 1050 Time Out: 1130 Total Time 40 Prolonged Time Billed  no       Greater than 50%  of this time was spent counseling and coordinating care related to the above assessment and plan.  GMicheline Rough MD  [phone] Please contact Palliative Medicine Team phone at [phone] questions and concerns.

## 2017-01-28 DIAGNOSIS — C799 Secondary malignant neoplasm of unspecified site: Secondary | ICD-10-CM

## 2017-01-28 LAB — BASIC METABOLIC PANEL
Anion gap: 3 — ABNORMAL LOW (ref 5–15)
BUN: 23 mg/dL — ABNORMAL HIGH (ref 6–20)
CALCIUM: 8 mg/dL — AB (ref 8.9–10.3)
CO2: 25 mmol/L (ref 22–32)
Chloride: 112 mmol/L — ABNORMAL HIGH (ref 101–111)
Creatinine, Ser: 0.59 mg/dL (ref 0.44–1.00)
GLUCOSE: 137 mg/dL — AB (ref 65–99)
POTASSIUM: 3.6 mmol/L (ref 3.5–5.1)
SODIUM: 140 mmol/L (ref 135–145)

## 2017-01-28 LAB — CBC
HCT: 31.7 % — ABNORMAL LOW (ref 36.0–46.0)
Hemoglobin: 10.1 g/dL — ABNORMAL LOW (ref 12.0–15.0)
MCH: 31.4 pg (ref 26.0–34.0)
MCHC: 31.9 g/dL (ref 30.0–36.0)
MCV: 98.4 fL (ref 78.0–100.0)
PLATELETS: 93 10*3/uL — AB (ref 150–400)
RBC: 3.22 MIL/uL — AB (ref 3.87–5.11)
RDW: 17.8 % — AB (ref 11.5–15.5)
WBC: 9.7 10*3/uL (ref 4.0–10.5)

## 2017-01-28 MED ORDER — DEXAMETHASONE SODIUM PHOSPHATE 10 MG/ML IJ SOLN: 10.0000 mg | Freq: Two times a day (BID) | INTRAMUSCULAR | 0 refills | Status: AC

## 2017-01-28 MED ORDER — ONDANSETRON HCL 4 MG/2ML IJ SOLN: 4.0000 mg | Freq: Four times a day (QID) | INTRAMUSCULAR | 0 refills | Status: AC | PRN

## 2017-01-28 MED ORDER — MORPHINE BOLUS VIA INFUSION: 0.5000 mg | INTRAVENOUS | 0 refills | Status: AC | PRN

## 2017-01-28 MED ORDER — ATROPINE SULFATE 1 % OP SOLN: 1.0000 [drp] | Freq: Three times a day (TID) | OPHTHALMIC | 12 refills | Status: AC | PRN

## 2017-01-28 MED ORDER — ATROPINE SULFATE 1 % OP SOLN
1.0000 [drp] | Freq: Three times a day (TID) | OPHTHALMIC | Status: DC | PRN
  Filled 2017-01-28: qty 2

## 2017-01-28 MED ORDER — SODIUM CHLORIDE 0.9 % IV SOLN: 5.0000 mg/h | INTRAVENOUS | 0 refills | Status: DC

## 2017-01-28 MED ORDER — PROMETHAZINE HCL 25 MG/ML IJ SOLN: 12.5000 mg | Freq: Four times a day (QID) | INTRAMUSCULAR | 0 refills | Status: AC | PRN

## 2017-01-28 MED ORDER — PROCHLORPERAZINE MALEATE 10 MG PO TABS
10.0000 mg | ORAL_TABLET | Freq: Four times a day (QID) | ORAL | 0 refills | Status: AC | PRN
Start: 2017-01-28 — End: ?

## 2017-01-28 MED ORDER — SODIUM CHLORIDE 0.9 % IV SOLN: 1.0000 mg/h | INTRAVENOUS | 0 refills | Status: AC

## 2017-01-28 MED ORDER — ACETAMINOPHEN 650 MG RE SUPP: 650.0000 mg | Freq: Four times a day (QID) | RECTAL | 0 refills | Status: AC | PRN

## 2017-01-28 MED ORDER — LORAZEPAM 2 MG/ML IJ SOLN: 0.5000 mg | INTRAMUSCULAR | 0 refills | Status: AC | PRN

## 2017-01-28 NOTE — Progress Notes (Signed)
CSW consulted to assist with residential hospice home placement. CSW met with spouse at bedside. Hospice Home choice provided. Spouse had toured [facility] last night and is hopeful pt will be able to transfer to [facility] at [facility]. CSW has contacted Erling Conte, LCSW, liaison from [facility], and referral provided. CSW will be contacted once a placement decision has been made.  Werner Lean LCSW [phone]

## 2017-01-28 NOTE — Progress Notes (Signed)
Pt is ready to dc to [facility]. Spouse is in agreement with this plan. DC Summary sent to facility for review. Scripts not required. # for report provided to nsg. [facility] transport is required. Medical necessity form completed.  Werner Lean LCSW [phone]

## 2017-01-28 NOTE — Progress Notes (Signed)
morphine 250 mg in sodium chloride 0.9 % 250 mL (1 mg/mL) infusion discontinued at patient's discharge to [facility].  40 mg or 32mLs were wasted in the sink with Dava Najjar, RN as a witness.

## 2017-01-28 NOTE — Progress Notes (Signed)
Daily Progress Note   Patient Name: Jill Johnston       Date: 01/28/2017 DOB: 02-01-1966  Age: 51 y.o. MRN#: [id number] Attending Physician: Elmarie Shiley, MD Primary Care Physician: [name], MD Admit Date: 01/20/2017  Reason for Consultation/Follow-up: Establishing goals of care  Subjective:  Patient somnolent during encounter.  I met with her husband outside of the room to discuss clinical course as well as care plan moving forward.     See below:   Length of Stay: 8  Current Medications: Scheduled Meds:  . Chlorhexidine Gluconate Cloth  6 each Topical Q0600  . dexamethasone  10 mg Intravenous Q12H  . enoxaparin (LOVENOX) injection  40 mg Subcutaneous Q24H  . feeding supplement (ENSURE ENLIVE)  237 mL Oral BID BM  . multivitamin with minerals  1 tablet Oral Daily  . pantoprazole (PROTONIX) IV  40 mg Intravenous QHS  . senna  1 tablet Oral BID  . sodium chloride flush  3 mL Intravenous Q12H    Continuous Infusions: . ceFEPime (MAXIPIME) IV Stopped (01/27/17 2359)  . dextrose 5 % and 0.45% NaCl 100 mL/hr at 01/28/17 0809  . levETIRAcetam Stopped (01/28/17 0333)  . morphine 9 mg/hr (01/28/17 0832)  . sodium chloride      PRN Meds: acetaminophen **OR** acetaminophen, bisacodyl, hydrALAZINE, LORazepam, metoprolol tartrate, morphine injection, morphine **AND** morphine, ondansetron **OR** ondansetron (ZOFRAN) IV, prochlorperazine, promethazine  Physical Exam         Weak, appears chronically ill; does not arouse to gentle verbal stimulation Dry mouth Regular breathing S1 S2 Abdomen soft Trace edema  Vital Signs: BP (!) 179/124 Comment: souse refused to let RN recheck to verify  Pulse (!) 134   Temp (!) 96.7 F (35.9 C) (Axillary)   Resp 15   Ht 5' 6"   (1.676 m)   Wt 61 kg (134 lb 7.7 oz)   SpO2 96%   BMI 21.71 kg/m  SpO2: SpO2: 96 % O2 Device: O2 Device: Nasal Cannula O2 Flow Rate: O2 Flow Rate (L/min): 3 L/min  Intake/output summary:   Intake/Output Summary (Last 24 hours) at 01/28/17 0910 Last data filed at 01/28/17 0600  Gross per 24 hour  Intake          2958.49 ml  Output  350 ml  Net          2608.49 ml   LBM: Last BM Date: 01/24/17 Baseline Weight: Weight: 56.7 kg (125 lb) Most recent weight: Weight: 61 kg (134 lb 7.7 oz)       Palliative Assessment/Data:    Flowsheet Rows     Most Recent Value  Intake Tab  Referral Department  Hospitalist  Unit at Time of Referral  Intermediate Care Unit  Palliative Care Primary Diagnosis  Cancer  Palliative Care Type  New Palliative care  Reason for referral  Pain, Non-pain Symptom, Counsel Regarding Hospice, Psychosocial or Spiritual support, Clarify Goals of Care, End of Life Care Assistance  Date first seen by Palliative Care  01/22/17  Clinical Assessment  Palliative Performance Scale Score  20%  Pain Max last 24 hours  5  Pain Min Last 24 hours  4  Dyspnea Max Last 24 Hours  5  Dyspnea Min Last 24 hours  4  Psychosocial & Spiritual Assessment  Palliative Care Outcomes  Patient/Family meeting held?  Yes  Who was at the meeting?  patient's husband.   Palliative Care Outcomes  Clarified goals of care      Patient Active Problem List   Diagnosis Date Noted  . Acute encephalopathy   . Protein-calorie malnutrition, severe 01/21/2017  . AKI (acute kidney injury) ([place])   . Constipation   . Dehydration   . Hypercalcemia of malignancy   . Acute metabolic encephalopathy   . Hypoxia   . Sepsis ([place]) 01/20/2017  . Hypercalcemia 01/20/2017  . Malignant neoplasm of upper-inner quadrant of left breast in female, estrogen receptor positive ([place]) 10/30/2016  . Metastatic cancer ([place]) 05/17/2016  . HCAP (healthcare-associated pneumonia) 05/17/2016  . Genetic  testing 05/11/2016  . Chemotherapy-induced thrombocytopenia 05/10/2016  . Leukopenia due to antineoplastic chemotherapy ([place]) 05/10/2016  . Metastasis to liver ([place]) 04/24/2016  . Breast cancer metastasized to lung, left ([place]) 04/24/2016  . Portacath in place 04/24/2016  . Oral thrush 04/24/2016  . Numbness 04/16/2016  . Essential hypertension 04/16/2016  . Carcinoma of left breast metastatic to multiple sites ([place]) 04/12/2016  . 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 Gastroesophageal reflux disease with esophagitis 03/20/2016  . Drug-induced leukopenia ([place]) 01/26/2016  . High risk medication use 01/26/2016  . Lymphedema of left arm 01/12/2016  . Status post cholecystectomy 01/12/2016  . Cancer associated pain 01/12/2016  . Breast cancer metastasized to bone, left ([place]) 01/12/2016  . Gastroesophageal reflux disease 07/04/2015  . Bone metastases ([place]) 01/07/2015  . Malignant neoplasm of left breast [facility])     Palliative Care Assessment & Plan   Patient Profile:    Assessment:  metastatic breast cancer Hypercalcemia Uncontrolled symptoms: generalized pain, restlessness Ongoing multi factorial encephalopathy HCAP  Recommendations/Plan:  Pain: Continue to titrate morphine continuous infusion as needed for comfort for management of pain and dyspnea.  Currently on 55m/hour (up from 839mhr yesterday) and appears to be resting comfortably on my examination.  Continue bolus dose to 0.5-51m16ms needed.  Ativan seems to be working well for symptom management of restlessness.  Continue 0.5-51mg8mivan Q4 hours prn.     Patient's [facility]  I placed consult and d/w social work.    Code Status:    Code Status Orders        Start  Ordered   01/22/17 1346  Limited resuscitation (code)  Continuous    Question Answer  Comment  In the event of cardiac or respiratory ARREST: Initiate Code Blue, Call Rapid Response Yes   In the event of cardiac or respiratory ARREST: Perform CPR No   In the event of cardiac or respiratory ARREST: Perform Intubation/Mechanical Ventilation No   In the event of cardiac or respiratory ARREST: Use NIPPV/BiPAp only if indicated Yes   In the event of cardiac or respiratory ARREST: Administer ACLS medications if indicated Yes   In the event of cardiac or respiratory ARREST: Perform Defibrillation or Cardioversion if indicated Yes   Comments discussed with husband, who is HCPOA      01/22/17 1346    Code Status History    Date Active Date Inactive Code Status Order ID Comments User Context   01/20/2017  6:05 PM 01/22/2017  1:46 PM Full Code [id number]  Elmarie Shiley, MD Inpatient   05/17/2016 12:36 PM 05/20/2016  3:57 PM Full Code [id number]  Mendel Corning, MD Inpatient   04/16/2016  6:00 PM 04/17/2016  6:44 PM DNR [id number]  Gordy Levan, MD Inpatient   04/16/2016  3:06 AM 04/16/2016  6:00 PM Full Code [id number]  Rise Patience, MD Inpatient   10/01/2014  9:32 AM 10/02/2014  3:43 AM Full Code [id number]  Marybelle Killings, MD HOV       Prognosis:  < 2 weeks.  She is now unresponsive and requiring increasing doses of medications to remain comfortable.  As her prognosis is less than 2 weeks and she is having increasing symptom burden, I believe that she would best bes served by residential hospice placement.  Discharge Planning:  [place] plan was discussed with  Husband, Erling Conte from [facility], Dr. Tyrell Antonio, SW Thank you for allowing the Palliative Medicine Team to assist in the care of this patient.   Time In: 0840 Time Out: 0910 Total Time 30 Prolonged Time Billed  no       Greater than 50%  of this time was spent counseling and coordinating care related to the above assessment and plan.  Micheline Rough, MD  [phone]  Please contact  Palliative Medicine Team phone at [phone] for questions and concerns.

## 2017-01-28 NOTE — Discharge Summary (Signed)
Physician Discharge Summary  Jill Johnston SJG:[id number] DOB: 1965-07-20 DOA: 01/20/2017  PCP: [name], MD  Admit date: 01/20/2017 Discharge date: 01/28/2017  Admitted From: Home  Disposition:  Residential Hospice  Recommendations for Outpatient Follow-up:  1. Patient discharge to [facility] place for comfort care   Discharge Condition: Guarded.  CODE STATUS: DNR Diet recommendation: comfort diet.   Brief/Interim Summary: Jill Johnston a 51 y.o.femalewith medical history significant of estrogen positive metastatic breast cancer, diagnosed 2008, status post treatment with Gemcitabine 7 months ago, presenting with increasing confusion. She has metastasis to liver which has progressed, lung mets stable, brain metastasis, plan was to start (CMF) by her oncologist. She had MRI brain which showed Worsening nodular pachymeningeal/leptomeningeal enhancement over the left cerebral convexity consistent with progressive metastatic disease.   Evaluation revealed pneumonia with acute renal failure associated with hypercalcemia.  Patient Mental status didn't improved with correction of hypercalcemia, IV fluids, treatment of PNA. 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 She received IV decadron. Patient continue to be in severe pain, and restless. She was started on IV morphine gtt. Palliative help with care. Patient will be transition to Comfort care, she will be transfer to [facility] place.       Assessment & Plan:   Active Problems:   Malignant neoplasm of left breast (HCC)   Breast cancer metastasized to bone, left (HCC)   Essential hypertension   Portacath in place   HCAP (healthcare-associated pneumonia)   Sepsis ([place])   Hypercalcemia   Protein-calorie malnutrition, severe   AKI (acute kidney injury) ([place])   Constipation   Dehydration   Hypercalcemia of malignancy   Acute metabolic encephalopathy   Hypoxia  1 Acute metabolic encephalopathy -Toxic  metabolic etiology due to combination of hypercalcemia with renal failure as well as pneumonia. 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 -Supportive care for now -ativan PRN  -continue with keppra, decadron.  -non verbal, still restless.  Plan is to transition to [facility] place for comfort care.   2  metastatic breast cancer to the brain, liver, bone Patient with progression of metastatic disease to the bone.  Continue with  IV Keppra and IV Decadron.  Poor prognosis. Hematology/oncology following Palliative medicine assisting with symptom mx/goals of care.  Patient will be transfer to [facility] place for comfort care.   3-symptomatic hypercalcemia of malignancy Patient status post zometa 4 mg IV 1.  Received calcitonin 200 units IM twice a day for total of 4 doses.  Received IV fluids and lasix.  Calcium has decreased to 8.1,. Resolved. No significant improvement after correction of hypercalcemia.   3 HCAP/pneumonia Chest x-ray- some bilateral infiltrates.   MRSA PCR negative -discontinued IV vancomycin as MRSA PCR is negative.  Patient on cefepime.  Follow-up blood cultures; No growth to date.  Increase WBC, unclear if patient aspirating, also.   Leukocytosis;  Patient had loose stool.  I spoke with Staff, if diarrhea persist, will send sample for C diff.  WBC normalized.   Hypertension PRN hydralazine.  BP increases when she is agitated.   acute kidney injury Creatinine on admission was 1.7.  Acute prerenal azotemia secondary to volume depletion.  Improved.   7 transaminitis Likely secondary to metastases to the liver. Follow clinically  8 constipation. Laxatives when necessary Had BM during admission. Now with diarrhea.   9 severe protein calorie malnutrition Nutritional support  11  hypernatremia Resolved.    Discharge Diagnoses:  Active Problems:   Malignant neoplasm of left breast (HCC)   Breast cancer metastasized to bone, left (HCC)   Essential hypertension   Portacath in place   HCAP (healthcare-associated pneumonia)   Sepsis ([place])   Hypercalcemia   Protein-calorie malnutrition, severe   AKI (acute kidney injury) ([place])   Constipation   Dehydration   Hypercalcemia of malignancy   Acute metabolic encephalopathy   Hypoxia    Discharge Instructions  Discharge Instructions    Diet - low sodium heart healthy    Complete by:  As directed    Increase activity slowly    Complete by:  As directed      Allergies as of 01/28/2017   No Known Allergies     Medication List    STOP taking these medications   dexamethasone 4 MG tablet Commonly known as:  DECADRON   docusate sodium 100 MG capsule Commonly known as:  COLACE   HYDROcodone-acetaminophen 7.5-325 mg/15 ml solution Commonly known as:  HYCET   lisinopril 10 MG tablet Commonly known as:  PRINIVIL,ZESTRIL   oxyCODONE 30 MG 12 hr tablet Commonly known as:  OXYCONTIN   Oxycodone HCl 10 MG Tabs   pantoprazole 40 MG tablet Commonly known as:  PROTONIX   polyethylene glycol packet Commonly known as:  MIRALAX / GLYCOLAX     TAKE these medications   acetaminophen 650 MG suppository Commonly known as:  TYLENOL Place 1 suppository (650 mg total) rectally every 6 (six) hours as needed for mild pain (or Fever >/= 101).   atropine 1 % ophthalmic solution Place 1 drop under the tongue 3 (three) times daily as needed (increase secretions.).   dexamethasone 10 MG/ML injection Commonly known as:  DECADRON Inject 1 mL (10 mg total) into the vein every 12 (twelve) hours.   lidocaine-prilocaine cream Commonly known as:  EMLA Apply 1 application topically as needed. To port 1 hour before going to be accessed with needle. Cover with plastic wrap.   LORazepam 2 MG/ML injection Commonly known  as:  ATIVAN Inject 0.25-0.5 mLs (0.5-1 mg total) into the vein every 4 (four) hours as needed for anxiety (or restlessness).   morphine 250 mg in sodium chloride 0.9 % 240 mL Inject 1-10 mg/hr into the vein continuous.   morphine 5 mg/mL Soln Inject 0.5-1 mg into the vein every 30 (thirty) minutes as needed.   ondansetron 4 MG/2ML Soln injection Commonly known as:  ZOFRAN Inject 2 mLs (4 mg total) into the vein every 6 (six) hours as needed for nausea.   prochlorperazine 10 MG tablet Commonly known as:  COMPAZINE Take 1 tablet (10 mg total) by mouth every 6 (six) hours as needed (Nausea or vomiting).   promethazine 25 MG/ML injection Commonly known as:  PHENERGAN Inject 0.5 mLs (12.5 mg total) into the vein every 6 (six) hours as needed for nausea or vomiting.       No Known Allergies  Consultations:  Oncology  palliative   Procedures/Studies: Dg Chest 2 View  Result Date: 01/20/2017 CLINICAL DATA:  Initial evaluation for acute altered mental status. EXAM: CHEST  2 VIEW COMPARISON:  Prior CT from 01/13/2017. FINDINGS: Right-sided Port-A-Cath in place. Cardiac and mediastinal silhouettes are within normal limits. Lungs are hypoinflated. No pulmonary edema or significant pleural effusion. No pneumothorax. Irregular patchy bibasilar opacities, right greater than left, which may reflect atelectasis or possibly infiltrates. Osseous metastases again noted. No acute osseus abnormality. Surgical  clips overlie the left axilla. IMPRESSION: 1. Shallow lung inflation with patchy bibasilar opacities, right greater than left. Findings may reflect atelectasis or possibly infiltrates. 2. Osseous metastases. Electronically Signed   By: Jeannine Boga M.D.   On: 01/20/2017 13:07   Dg Abd 1 View  Result Date: 01/20/2017 CLINICAL DATA:  Constipation EXAM: ABDOMEN - 1 VIEW COMPARISON:  None. FINDINGS: Scattered large and small bowel gas is noted. Postsurgical changes are noted consistent  cholecystectomy. No definitive renal or ureteral calculi are seen. Fecal material is noted throughout the colon consistent with a mild degree of constipation. Sclerotic changes are noted within the pelvic bones and ribcage consistent with metastatic disease. IMPRESSION: Changes consistent with bony metastatic disease. Mild constipation is seen. Electronically Signed   By: Inez Catalina M.D.   On: 01/20/2017 17:15   Ct Head Wo Contrast  Result Date: 01/20/2017 CLINICAL DATA:  Altered mental status. Progressive confusion. Breast cancer with dural an osseous metastases. EXAM: CT HEAD WITHOUT CONTRAST TECHNIQUE: Contiguous axial images were obtained from the base of the skull through the vertex without intravenous contrast. COMPARISON:  None. FINDINGS: Brain: Dural-based calcifications are associated with known metastatic disease. Extensive white matter changes are again noted. No acute cortical infarct, hemorrhage, or mass lesion is present. Basal ganglia are stable. The insular cortex is normal. The brainstem and cerebellum are normal. There is no mass effect. No significant extra-axial fluid collection is present. Vascular: Atherosclerotic calcifications are present within the cavernous internal carotid artery's bilaterally. There is no hyperdense vessel. Skull: Multiple osseous metastases are similar to the prior studies. Both sclerotic and lucent lesions are present. Sinuses/Orbits: The paranasal sinuses and mastoid air cells are clear. The globes and orbits are within normal limits. IMPRESSION: 1. No acute intracranial abnormality or significant interval change. 2. Dural based calcifications associated with known dural-based metastatic disease over the left convexity. 3. Osseous metastases. Electronically Signed   By: San Morelle M.D.   On: 01/20/2017 12:39   Ct Chest W Contrast  Result Date: 01/13/2017 CLINICAL DATA:  Metastatic breast cancer undergoing chemotherapy. History of hepatic and osseous  metastasis. EXAM: CT CHEST WITH CONTRAST TECHNIQUE: Multidetector CT imaging of the chest was performed during intravenous contrast administration. CONTRAST:  41mL ISOVUE-300 IOPAMIDOL (ISOVUE-300) INJECTION 61% COMPARISON:  Multiple prior CT scans.  The most recent is 10/15/2016 FINDINGS: Cardiovascular: The heart is normal in size. No pericardial effusion. The aorta is normal in caliber. No dissection. The branch vessels are patent. Suspect small scattered coronary artery calcifications. Mediastinum/Nodes: No mediastinal or hilar mass or lymphadenopathy. Small scattered lymph nodes are noted. Lungs/Pleura: Stable radiation changes involving the left anterior lung. Stable 4.5 mm right apical lung nodule. Stable right middle lobe atelectasis and/or scarring. There are new small bilateral pleural effusions without definite enhancing pleural nodules. Overlying atelectasis is noted. Upper Abdomen: Diffuse hepatic metastatic disease appears progressive since prior study. Several new lesions are noted at the hepatic dome. Segment 7 lesion previously measuring a maximum of 3.3 cm now measures 5.5 cm. No adrenal gland metastasis.  No upper abdominal lymphadenopathy. Chest wall/ Musculoskeletal: Stable surgical changes from a left mastectomy. No definite findings for chest wall recurrence. No axillary or supraclavicular adenopathy. The thyroid gland appears normal. Diffuse osseous metastatic disease progressive since prior study. No pathologic fracture or canal compromise. IMPRESSION: 1. Progressive hepatic metastatic disease and osseous metastatic disease. 2. Stable 4.5 mm right upper lobe pulmonary nodule. No new pulmonary lesions. 3. New small bilateral pleural effusions without definite  enhancing pleural nodules. Electronically Signed   By: Marijo Sanes M.D.   On: 01/13/2017 13:09   Mr Jeri Cos KG Contrast  Result Date: 01/18/2017 CLINICAL DATA:  Metastatic breast cancer. Whole-brain radiation in 2017. Left optic  nerve injury. EXAM: MRI HEAD AND ORBITS WITHOUT AND WITH CONTRAST TECHNIQUE: Multiplanar, multiecho pulse sequences of the brain and surrounding structures were obtained without and with intravenous contrast. Multiplanar, multiecho pulse sequences of the orbits and surrounding structures were obtained including fat saturation techniques, before and after intravenous contrast administration. CONTRAST:  87mL MULTIHANCE GADOBENATE DIMEGLUMINE 529 MG/ML IV SOLN COMPARISON:  10/22/2016 FINDINGS: MRI HEAD FINDINGS Brain: There is no evidence of acute infarct, intracranial hemorrhage, midline shift, or extra-axial fluid collection. The ventricles and sulci are normal in size. Patchy to confluent T2 hyperintensities in the cerebral white matter are similar to the prior study and likely reflect post treatment changes. Mild cerebellar tonsillar ectopia is unchanged. There are multiple foci nodular enhancement involving the dura and along the superficial surface of the brain over the left frontal and left parietal convexities which have increased from the prior study. The largest single nodular focus of enhancement measures 11 x 5 mm (series 17, image 115). Background mild diffuse smooth dural thickening and enhancement over the left cerebral convexity has mildly increased. Mild smooth dural enhancement in the right parieto-occipital region subjacent to an osseous metastasis is similar to the prior study (series 17, image 94). No definite intra-axial metastases are identified. Vascular: Major intracranial vascular flow voids are preserved. Skull and upper cervical spine: Widespread osseous metastatic disease as previously seen. Unchanged chronic C3 superior endplate fracture. Other: None. MRI ORBITS FINDINGS Orbits: The globes appear intact. The optic nerves are symmetric in appearance without definite nerve edema or abnormal enhancement within limitations of motion artifact on postcontrast sequences. No orbital mass is  identified. The extra-ocular muscles are symmetric and normal in appearance. The lacrimal glands are grossly unremarkable. Visualized sinuses: Minimal bilateral ethmoid and right maxillary sinus mucosal thickening. Bilateral mastoid effusions. Soft tissues: Unremarkable. IMPRESSION: 1. Worsening nodular pachymeningeal/leptomeningeal enhancement over the left cerebral convexity consistent with progressive metastatic disease. 2. Widespread osseous metastases. 3. No acute abnormality or evidence of metastatic disease in the orbits. Electronically Signed   By: Logan Bores M.D.   On: 01/18/2017 17:42   US Abdomen Complete  Result Date: 01/20/2017 CLINICAL DATA:  Altered mental status. History of breast cancer and cholecystectomy. EXAM: ABDOMEN ULTRASOUND COMPLETE COMPARISON:  Abdominal CT and PET-CT 03/16/2016. Chest CT 01/13/2017. FINDINGS: Gallbladder: Cholecystectomy. Common bile duct: Diameter: 15 mm. This appears chronic and similar to previous CT. No intraductal filling defects are seen. Liver: Multiple hypoechoic masses are seen throughout the liver consistent with known metastatic disease. The largest lesion posteriorly in the right lobe measures 7.9 x 4.8 x 3.5 cm. IVC: No abnormality visualized. Pancreas: Visualized portion unremarkable. Spleen: Size and appearance within normal limits. Right Kidney: Length: 10.6 cm. Echogenicity within normal limits. No mass or hydronephrosis visualized. Left Kidney: Length: 11.9 cm. Echogenicity within normal limits. No mass or hydronephrosis visualized. Abdominal aorta: No aneurysm visualized. Other findings: No ascites. IMPRESSION: 1. No acute findings are seen. 2. Stable chronic extrahepatic biliary dilatation, likely physiologic status post cholecystectomy. 3. Multifocal hepatic metastatic disease as seen on previous studies. Comparison between this ultrasound and prior CT/PET CT is limited. Follow up CT should be considered to better assess for disease progression.  Electronically Signed   By: Richardean Sale M.D.   On: 01/20/2017  13:51   Dg Chest Port 1 View  Result Date: 01/22/2017 CLINICAL DATA:  Hypoxia EXAM: PORTABLE CHEST 1 VIEW COMPARISON:  01/21/2017 FINDINGS: Right Port-A-Cath remains in place, unchanged. Heart is normal size. Improving aeration at the right base with decreasing airspace opacity. Increasing left basilar atelectasis or infiltrate and possible small left effusion. IMPRESSION: Decreasing right basilar airspace opacity. Worsening left base atelectasis or infiltrate with small left effusion. Electronically Signed   By: Rolm Baptise M.D.   On: 01/22/2017 07:09   Dg Chest Port 1 View  Result Date: 01/21/2017 CLINICAL DATA:  Altered mental status, history of breast carcinoma with known metastasis, low oxygen saturation EXAM: PORTABLE CHEST 1 VIEW COMPARISON:  Chest x-ray of 01/20/2017 FINDINGS: There is abnormal opacity at the right lung base which may reflect atelectasis and effusion. Pneumonia cannot be excluded follow-up is recommended with two-view chest x-ray if possible. The left lung appears clear. Right sided Port-A-Cath is unchanged with the tip near the expected SVC -RA junction and heart size is stable. There is some inhomogeneity of the lateral aspect of several ribs bilaterally suspicious for metastases. IMPRESSION: 1. New opacity at the right lung base. Consider atelectasis and effusion although pneumonia cannot be excluded. 2. Mottled appearance of several ribs bilaterally suspicious for bone metastases. Electronically Signed   By: Ivar Drape M.D.   On: 01/21/2017 15:29   Mr Rosealee Albee HB Contrast  Result Date: 01/18/2017 CLINICAL DATA:  Metastatic breast cancer. Whole-brain radiation in 2017. Left optic nerve injury. EXAM: MRI HEAD AND ORBITS WITHOUT AND WITH CONTRAST TECHNIQUE: Multiplanar, multiecho pulse sequences of the brain and surrounding structures were obtained without and with intravenous contrast. Multiplanar, multiecho  pulse sequences of the orbits and surrounding structures were obtained including fat saturation techniques, before and after intravenous contrast administration. CONTRAST:  63mL MULTIHANCE GADOBENATE DIMEGLUMINE 529 MG/ML IV SOLN COMPARISON:  10/22/2016 FINDINGS: MRI HEAD FINDINGS Brain: There is no evidence of acute infarct, intracranial hemorrhage, midline shift, or extra-axial fluid collection. The ventricles and sulci are normal in size. Patchy to confluent T2 hyperintensities in the cerebral white matter are similar to the prior study and likely reflect post treatment changes. Mild cerebellar tonsillar ectopia is unchanged. There are multiple foci nodular enhancement involving the dura and along the superficial surface of the brain over the left frontal and left parietal convexities which have increased from the prior study. The largest single nodular focus of enhancement measures 11 x 5 mm (series 17, image 115). Background mild diffuse smooth dural thickening and enhancement over the left cerebral convexity has mildly increased. Mild smooth dural enhancement in the right parieto-occipital region subjacent to an osseous metastasis is similar to the prior study (series 17, image 94). No definite intra-axial metastases are identified. Vascular: Major intracranial vascular flow voids are preserved. Skull and upper cervical spine: Widespread osseous metastatic disease as previously seen. Unchanged chronic C3 superior endplate fracture. Other: None. MRI ORBITS FINDINGS Orbits: The globes appear intact. The optic nerves are symmetric in appearance without definite nerve edema or abnormal enhancement within limitations of motion artifact on postcontrast sequences. No orbital mass is identified. The extra-ocular muscles are symmetric and normal in appearance. The lacrimal glands are grossly unremarkable. Visualized sinuses: Minimal bilateral ethmoid and right maxillary sinus mucosal thickening. Bilateral mastoid  effusions. Soft tissues: Unremarkable. IMPRESSION: 1. Worsening nodular pachymeningeal/leptomeningeal enhancement over the left cerebral convexity consistent with progressive metastatic disease. 2. Widespread osseous metastases. 3. No acute abnormality or evidence of metastatic disease in the  orbits. Electronically Signed   By: Logan Bores M.D.   On: 01/18/2017 17:42   (Echo, Carotid, EGD, Colonoscopy, ERCP)    Subjective:   Discharge Exam: Vitals:   01/28/17 0600 01/28/17 0800  BP: 114/82 (!) 179/124  Pulse: 75 (!) 134  Resp: (!) 9 15  Temp:  (!) 96.7 F (35.9 C)   Vitals:   01/28/17 0323 01/28/17 0400 01/28/17 0600 01/28/17 0800  BP:  119/88 114/82 (!) 179/124  Pulse:  77 75 (!) 134  Resp:  (!) 7 (!) 9 15  Temp:  97.7 F (36.5 C)  (!) 96.7 F (35.9 C)  TempSrc:  Axillary  Axillary  SpO2:  99% 99% 96%  Weight: 61 kg (134 lb 7.7 oz)     Height:        General: Pt is alert, awake, not in acute distress Cardiovascular: RRR, S1/S2 +, no rubs, no gallops Respiratory: CTA bilaterally, no wheezing, no rhonchi Abdominal: Soft, NT, ND, bowel sounds + Extremities: no edema, no cyanosis    The results of significant diagnostics from this hospitalization (including imaging, microbiology, ancillary and laboratory) are listed below for reference.     Microbiology: Recent Results (from the past 240 hour(s))  Urine culture     Status: Abnormal   Collection Time: 01/20/17 11:43 AM  Result Value Ref Range Status   Specimen Description URINE, CATHETERIZED  Final   Special Requests Immunocompromised  Final   Culture MULTIPLE SPECIES PRESENT, SUGGEST RECOLLECTION (A)  Final   Report Status 01/21/2017 FINAL  Final  Culture, blood (routine x 2)     Status: None   Collection Time: 01/20/17 11:56 AM  Result Value Ref Range Status   Specimen Description BLOOD PORT  Final   Special Requests   Final    BOTTLES DRAWN AEROBIC AND ANAEROBIC Blood Culture adequate volume   Culture   Final     NO GROWTH 5 DAYS Performed at [facility], [address], [place], [place] [address]    Report Status 01/25/2017 FINAL  Final  Culture, blood (routine x 2)     Status: None   Collection Time: 01/20/17 12:15 PM  Result Value Ref Range Status   Specimen Description BLOOD RIGHT HAND  Final   Special Requests   Final    BOTTLES DRAWN AEROBIC AND ANAEROBIC Blood Culture adequate volume   Culture   Final    NO GROWTH 5 DAYS Performed at [facility], [place] [address], [place], [place] [address]    Report Status 01/25/2017 FINAL  Final  MRSA PCR Screening     Status: None   Collection Time: 01/20/17  6:20 PM  Result Value Ref Range Status   MRSA by PCR NEGATIVE NEGATIVE Final    Comment:        The GeneXpert MRSA Assay (FDA approved for NASAL specimens only), is one component of a comprehensive MRSA colonization surveillance program. It is not intended to diagnose MRSA infection nor to guide or monitor treatment for MRSA infections.      Labs: BNP (last 3 results)  Recent Labs  05/04/16 1307  BNP 75.1   Basic Metabolic Panel:  Recent Labs Lab 01/22/17 0422  01/25/17 0414 01/26/17 0341 01/27/17 0306 01/27/17 0535 01/28/17 0313  NA 147*  < > 144 143 136 141 140  K 3.2*  < > 5.2* 4.3 3.7 4.1 3.6  CL 107  < > 113* 112* 109 109 112*  CO2 31  < >  27 28 24 25 25   GLUCOSE 154*  < > 163* 162* 555* 155* 137*  BUN 38*  < > 30* 28* 23* 24* 23*  CREATININE 1.31*  < > 0.64 0.74 0.67 0.67 0.59  CALCIUM 9.6  < > 8.1* 8.0* 7.1* 8.2* 8.0*  MG 2.5*  --   --   --   --   --   --   < > = values in this interval not displayed. Liver Function Tests:  Recent Labs Lab 01/22/17 0422 01/27/17 0306  AST 105* 91*  ALT 47 32  ALKPHOS 165* 206*  BILITOT 0.8 0.6  PROT 5.8* 4.3*  ALBUMIN 2.4* 1.7*   No results for input(s): LIPASE, AMYLASE in the last 168 hours. No results for input(s): AMMONIA in the last 168 hours. CBC:  Recent Labs Lab 01/22/17 0422  01/23/17 0430 01/25/17 0414 01/26/17 0341 01/27/17 0306 01/27/17 0535 01/28/17 0313  WBC 14.6* 16.2* 10.5 10.1 13.5* 16.1* 9.7  NEUTROABS 13.2* 14.6*  --   --  12.4* 14.8*  --   HGB 11.9* 11.3* 10.5* 10.6* 9.6* 11.5* 10.1*  HCT 38.5 36.2 34.5* 34.2* 30.7* 37.1 31.7*  MCV 98.2 99.5 101.2* 98.8 100.7* 98.4 98.4  PLT 142* 134* 101* 115* 97* 124* 93*   Cardiac Enzymes: No results for input(s): CKTOTAL, CKMB, CKMBINDEX, TROPONINI in the last 168 hours. BNP: Invalid input(s): POCBNP CBG: No results for input(s): GLUCAP in the last 168 hours. D-Dimer No results for input(s): DDIMER in the last 72 hours. Hgb A1c No results for input(s): HGBA1C in the last 72 hours. Lipid Profile No results for input(s): CHOL, HDL, LDLCALC, TRIG, CHOLHDL, LDLDIRECT in the last 72 hours. Thyroid function studies No results for input(s): TSH, T4TOTAL, T3FREE, THYROIDAB in the last 72 hours.  Invalid input(s): FREET3 Anemia work up No results for input(s): VITAMINB12, FOLATE, FERRITIN, TIBC, IRON, RETICCTPCT in the last 72 hours. Urinalysis    Component Value Date/Time   COLORURINE YELLOW 01/20/2017 1143   APPEARANCEUR HAZY (A) 01/20/2017 1143   LABSPEC 1.018 01/20/2017 1143   PHURINE 5.0 01/20/2017 1143   GLUCOSEU NEGATIVE 01/20/2017 1143   HGBUR SMALL (A) 01/20/2017 1143   BILIRUBINUR NEGATIVE 01/20/2017 1143   KETONESUR 5 (A) 01/20/2017 1143   PROTEINUR NEGATIVE 01/20/2017 1143   NITRITE NEGATIVE 01/20/2017 1143   LEUKOCYTESUR NEGATIVE 01/20/2017 1143   Sepsis Labs Invalid input(s): PROCALCITONIN,  WBC,  LACTICIDVEN Microbiology Recent Results (from the past 240 hour(s))  Urine culture     Status: Abnormal   Collection Time: 01/20/17 11:43 AM  Result Value Ref Range Status   Specimen Description URINE, CATHETERIZED  Final   Special Requests Immunocompromised  Final   Culture MULTIPLE SPECIES PRESENT, SUGGEST RECOLLECTION (A)  Final   Report Status 01/21/2017 FINAL  Final  Culture,  blood (routine x 2)     Status: None   Collection Time: 01/20/17 11:56 AM  Result Value Ref Range Status   Specimen Description BLOOD PORT  Final   Special Requests   Final    BOTTLES DRAWN AEROBIC AND ANAEROBIC Blood Culture adequate volume   Culture   Final    NO GROWTH 5 DAYS Performed at [facility], [place] [address], [place], [place] [address]    Report Status 01/25/2017 FINAL  Final  Culture, blood (routine x 2)     Status: None   Collection Time: 01/20/17 12:15 PM  Result Value Ref Range Status   Specimen Description BLOOD RIGHT HAND  Final   Special Requests   Final    BOTTLES DRAWN AEROBIC AND ANAEROBIC Blood Culture adequate volume   Culture   Final    NO GROWTH 5 DAYS Performed at [facility], [address], [place], [place] [address]    Report Status 01/25/2017 FINAL  Final  MRSA PCR Screening     Status: None   Collection Time: 01/20/17  6:20 PM  Result Value Ref Range Status   MRSA by PCR NEGATIVE NEGATIVE Final    Comment:        The GeneXpert MRSA Assay (FDA approved for NASAL specimens only), is one component of a comprehensive MRSA colonization surveillance program. It is not intended to diagnose MRSA infection nor to guide or monitor treatment for MRSA infections.      Time coordinating discharge: Over 30 minutes  SIGNED:   Elmarie Shiley, MD  [facility] 01/28/2017, 9:38 AM Pager   If 7PM-7AM, please contact night-coverage [email or web address] Password [email or web address]

## 2017-02-01 ENCOUNTER — Other Ambulatory Visit: Payer: BLUE CROSS/BLUE SHIELD

## 2017-02-01 ENCOUNTER — Ambulatory Visit: Payer: BLUE CROSS/BLUE SHIELD | Admitting: Oncology

## 2017-02-27 DEATH — deceased

## 2017-06-25 ENCOUNTER — Encounter: Payer: Self-pay | Admitting: Radiation Therapy

## 2017-07-17 ENCOUNTER — Other Ambulatory Visit: Payer: Self-pay | Admitting: Nurse Practitioner

## 2017-09-22 IMAGING — CT CT ABD-PELV W/ CM
1 of 5 series · 13 of 46 positions shown, 18 images · IV contrast (OMNIPAQUE)
Comparison: MR pelvis 12/28/2014, MR thoracolumbar spine 10/04/2014
and PET 09/25/2014.

CLINICAL DATA: Breast cancer, bone metastasis, radiation therapy
complete, chemotherapy ongoing.

EXAM:
CT CHEST, ABDOMEN, AND PELVIS WITH CONTRAST
TECHNIQUE: Multidetector CT imaging of the chest, abdomen and pelvis was
performed following the standard protocol during bolus
administration of intravenous contrast.
CONTRAST:  75mL OMNIPAQUE IOHEXOL 300 MG/ML  SOLN

[Series 3: cap with st · axial · 0.83mm/px · z∈[-460,+80]mm · 13 of 122 slices shown, 18 images]
[im 7/122  soft-tissue]
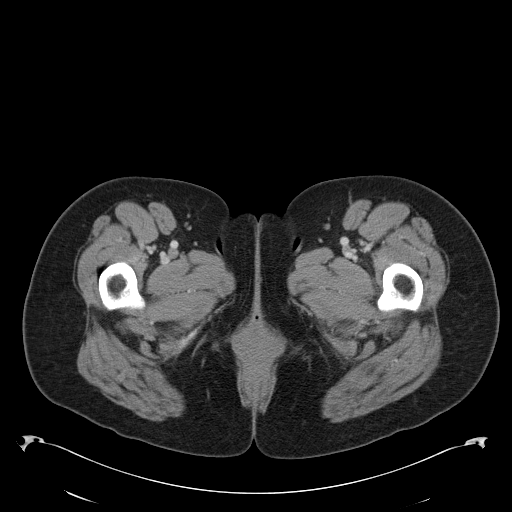
[im 7/122  bone]
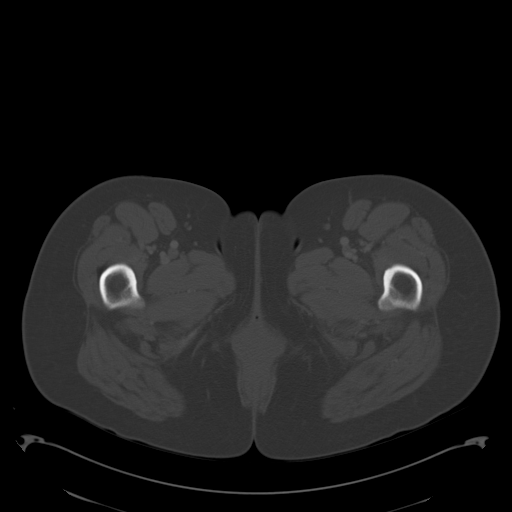
[im 21/122  soft-tissue]
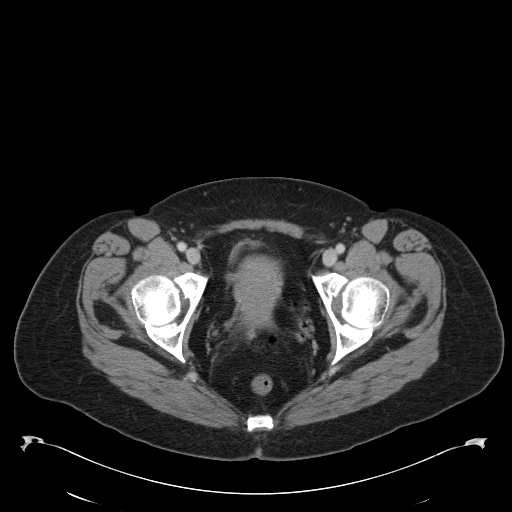
[im 27/122  soft-tissue]
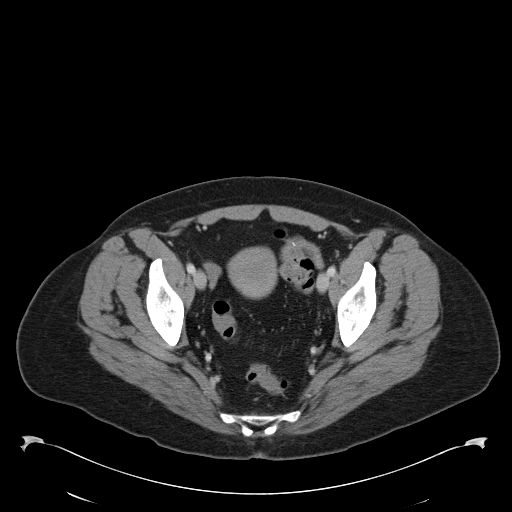
[im 34/122  soft-tissue]
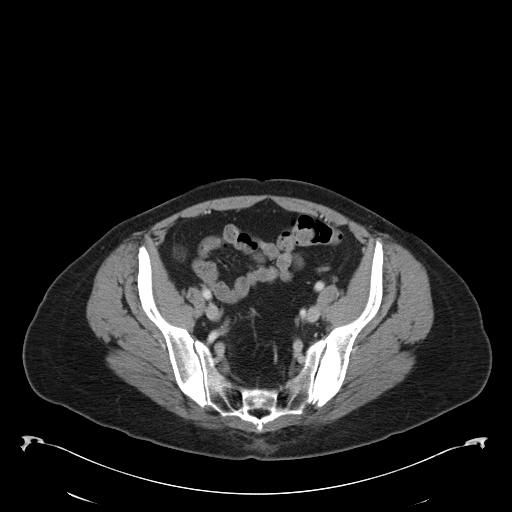
[im 48/122  soft-tissue]
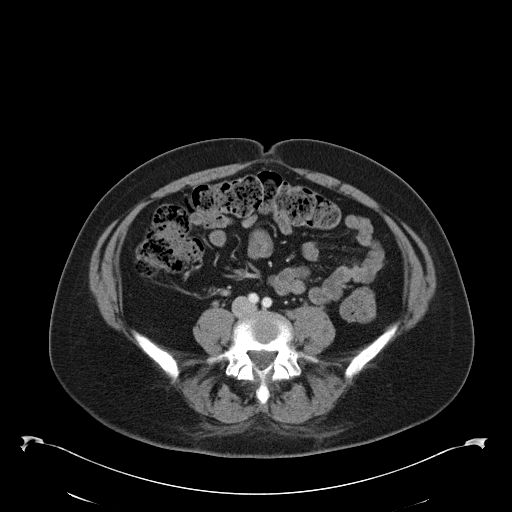
[im 54/122  soft-tissue]
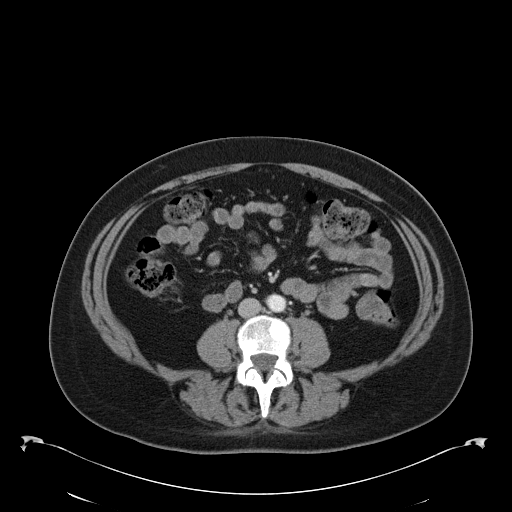
[im 68/122  soft-tissue]
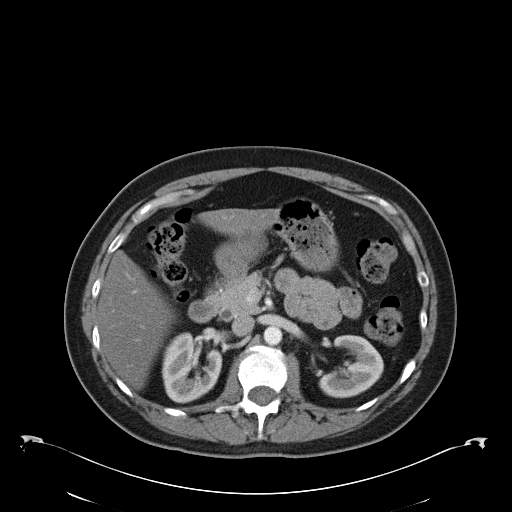
[im 74/122  soft-tissue]
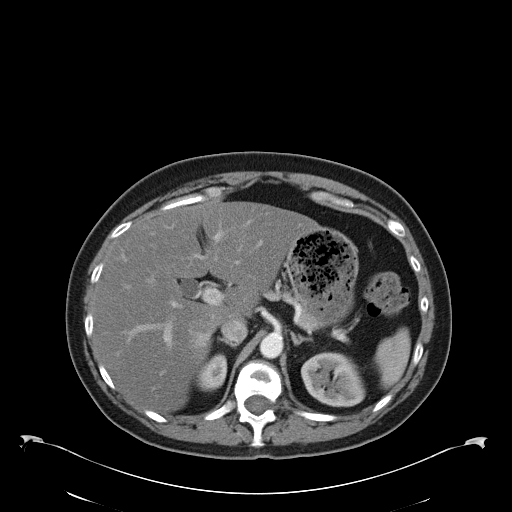
[im 88/122  soft-tissue]
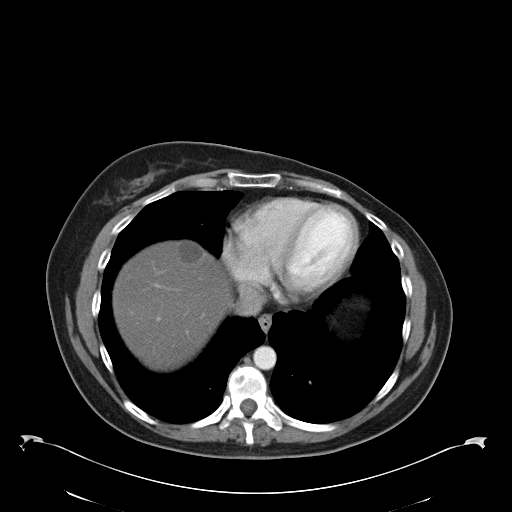
[im 88/122  bone]
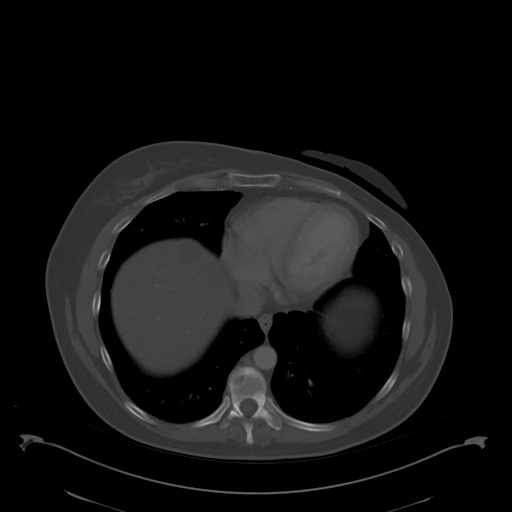
[im 95/122  soft-tissue]
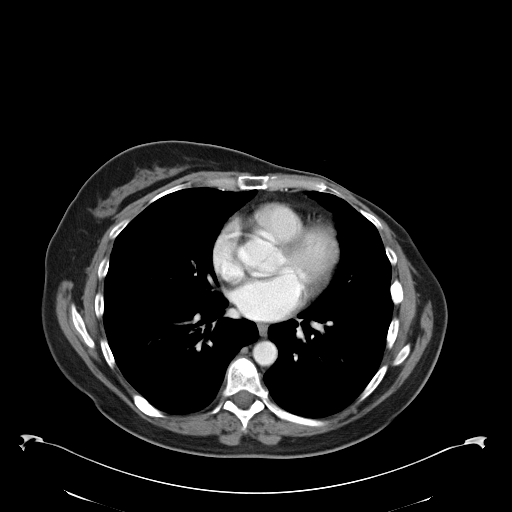
[im 95/122  lung]
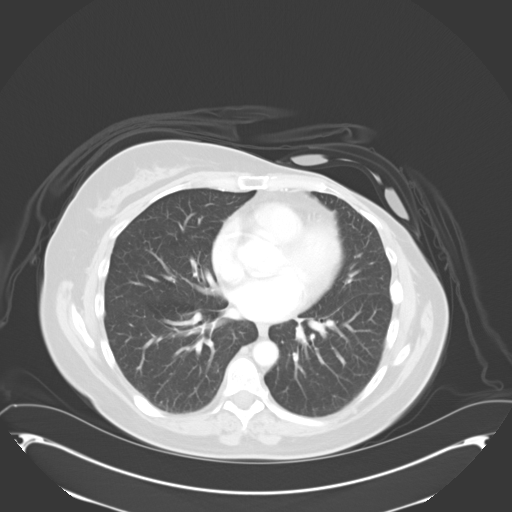
[im 101/122  soft-tissue]
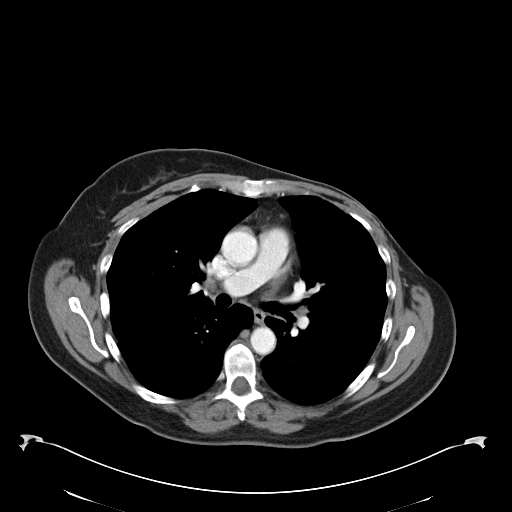
[im 101/122  lung]
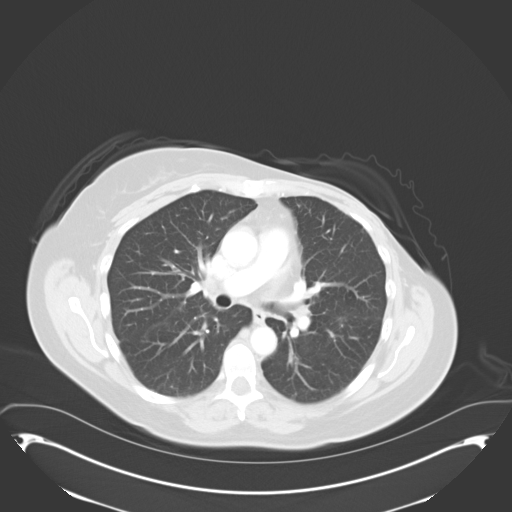
[im 108/122  lung]
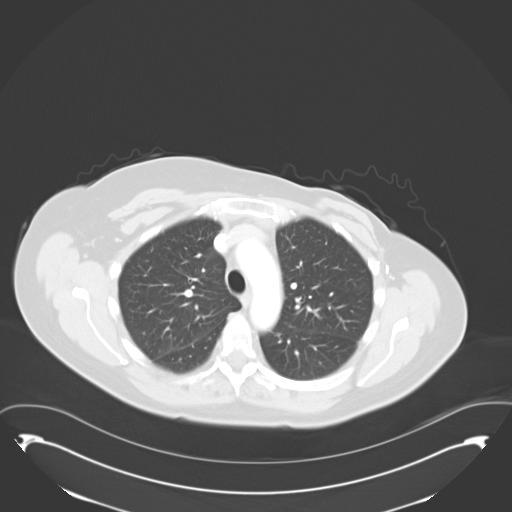
[im 115/122  soft-tissue]
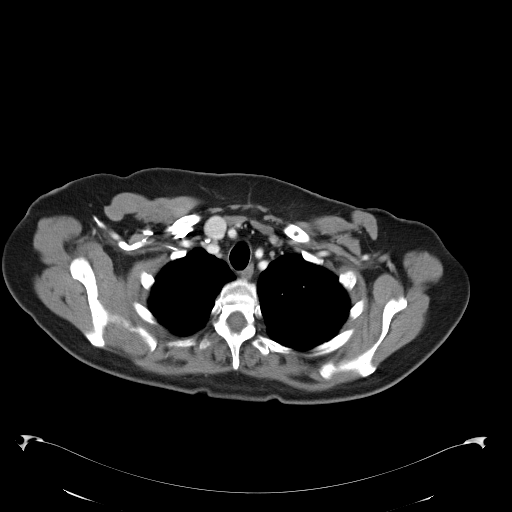
[im 115/122  lung]
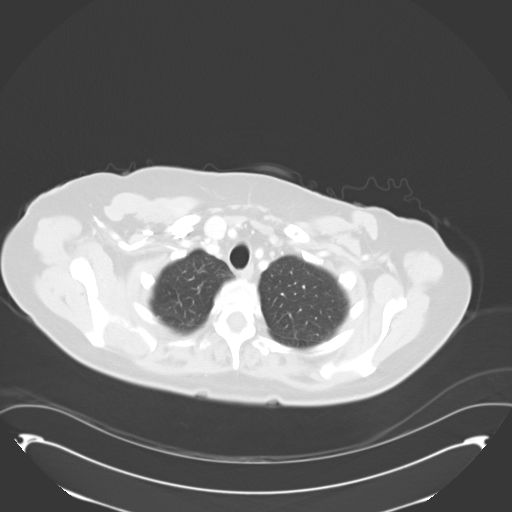

[13 of 46 positions shown; findings below may reference images not displayed]

FINDINGS: CT CHEST FINDINGS

Mediastinum/Nodes: No pathologically enlarged mediastinal, hilar,
internal mammary or axillary lymph nodes. Heart size normal. No
pericardial effusion.

Lungs/Pleura: When compared with 09/25/2014, there has been overall
improvement in diffuse pulmonary nodularity without complete
resolution. Index right perihilar nodule, along the medial margin of
the right major fissure, measures 6 mm (series 5, image 19),
previously 9 mm. No pleural fluid. Airway is unremarkable.

Musculoskeletal: Previously seen lytic lesions on 09/25/2014 are now
largely sclerotic or peripherally sclerotic. When compared with
10/03/2014, lesions may be progressive. Scattered rib lesions are
new.

CT ABDOMEN PELVIS FINDINGS

Hepatobiliary: Liver is decreased in attenuation diffusely.
Low-attenuation lesion in the dome measures 2.0 cm, stable. An 11 mm
low-attenuation lesion in the left hepatic lobe (series 3, image 41)
is not well seen on 09/25/2014. Hyper attenuating lesion in the
medial aspect of the posterior right hepatic lobe measures
approximately 1.5 cm (image 50), stable. Biliary ductal dilatation
may relate to cholecystectomy.

Pancreas: Negative.

Spleen: Negative.

Adrenals/Urinary Tract: Adrenal glands and kidneys are unremarkable.
Ureters are decompressed. Bladder is low in volume.

Stomach/Bowel: Stomach and small bowel are unremarkable. Appendix is
not readily visualized. Stool is seen in the majority of the colon,
indicative of constipation.

Vascular/Lymphatic: Minimal atherosclerotic calcification of the
arterial vasculature. Scattered lymph nodes are not enlarged by CT
size criteria.

Reproductive: Uterus and ovaries are visualized.

Other: Tiny periumbilical hernia contains fat. No free fluid.
Mesenteries and peritoneum are unremarkable.

Musculoskeletal: Again, lesions are predominantly sclerotic in the
pelvis and spine, when compared with 09/25/2014. Prominent Schmorl's
nodes or mild compression along the inferior endplates of T11 and
T12 and superior endplates of L2 and L3. Mild compression
deformities involving the superior endplates of L5 and S1.
IMPRESSION: 1. Interval improvement in pulmonary metastatic disease, without
complete resolution.
2. Possible new left hepatic lobe metastasis.
3. Osseous metastatic disease is now predominantly sclerotic, which
can be seen with healing. However, there do appear to be new rib
lesions when compared with 09/25/2014, worrisome for progression.
4. Hepatic steatosis.
